# Patient Record
Sex: Female | Born: 1975 | Race: Black or African American | Hispanic: No | Marital: Single | Smoking: Never smoker
Health system: Southern US, Community
[De-identification: ages and names within clinical notes are randomized; demographics above are authoritative.]

## PROBLEM LIST (undated history)

## (undated) DIAGNOSIS — K219 Gastro-esophageal reflux disease without esophagitis: Secondary | ICD-10-CM

## (undated) DIAGNOSIS — Q02 Microcephaly: Secondary | ICD-10-CM

## (undated) DIAGNOSIS — F79 Unspecified intellectual disabilities: Secondary | ICD-10-CM

## (undated) DIAGNOSIS — E119 Type 2 diabetes mellitus without complications: Secondary | ICD-10-CM

## (undated) DIAGNOSIS — Q7160 Lobster-claw hand, unspecified hand: Secondary | ICD-10-CM

## (undated) DIAGNOSIS — D821 Di George's syndrome: Secondary | ICD-10-CM

## (undated) DIAGNOSIS — R1900 Intra-abdominal and pelvic swelling, mass and lump, unspecified site: Secondary | ICD-10-CM

## (undated) DIAGNOSIS — I10 Essential (primary) hypertension: Secondary | ICD-10-CM

## (undated) DIAGNOSIS — Q999 Chromosomal abnormality, unspecified: Secondary | ICD-10-CM

## (undated) DIAGNOSIS — E7800 Pure hypercholesterolemia, unspecified: Secondary | ICD-10-CM

## (undated) DIAGNOSIS — K439 Ventral hernia without obstruction or gangrene: Secondary | ICD-10-CM

## (undated) DIAGNOSIS — Q730 Congenital absence of unspecified limb(s): Secondary | ICD-10-CM

## (undated) HISTORY — PX: CHOLECYSTECTOMY: SHX55

## (undated) HISTORY — PX: HERNIA REPAIR: SHX51

## (undated) HISTORY — PX: HAND SURGERY: SHX662

---

## 2000-09-17 ENCOUNTER — Emergency Department (HOSPITAL_COMMUNITY): Admission: EM | Admit: 2000-09-17 | Discharge: 2000-09-17 | Payer: Self-pay | Admitting: Emergency Medicine

## 2001-11-14 ENCOUNTER — Emergency Department (HOSPITAL_COMMUNITY): Admission: EM | Admit: 2001-11-14 | Discharge: 2001-11-14 | Payer: Self-pay | Admitting: Emergency Medicine

## 2002-04-10 ENCOUNTER — Emergency Department (HOSPITAL_COMMUNITY): Admission: EM | Admit: 2002-04-10 | Discharge: 2002-04-10 | Payer: Self-pay | Admitting: Emergency Medicine

## 2004-06-15 ENCOUNTER — Emergency Department (HOSPITAL_COMMUNITY): Admission: EM | Admit: 2004-06-15 | Discharge: 2004-06-15 | Payer: Self-pay | Admitting: Emergency Medicine

## 2005-07-25 ENCOUNTER — Emergency Department (HOSPITAL_COMMUNITY): Admission: EM | Admit: 2005-07-25 | Discharge: 2005-07-25 | Payer: Self-pay | Admitting: Emergency Medicine

## 2007-01-16 ENCOUNTER — Inpatient Hospital Stay (HOSPITAL_COMMUNITY): Admission: EM | Admit: 2007-01-16 | Discharge: 2007-01-19 | Payer: Self-pay | Admitting: Emergency Medicine

## 2007-03-17 ENCOUNTER — Emergency Department (HOSPITAL_COMMUNITY): Admission: EM | Admit: 2007-03-17 | Discharge: 2007-03-18 | Payer: Self-pay | Admitting: Emergency Medicine

## 2007-03-19 ENCOUNTER — Emergency Department (HOSPITAL_COMMUNITY): Admission: EM | Admit: 2007-03-19 | Discharge: 2007-03-19 | Payer: Self-pay | Admitting: Family Medicine

## 2007-04-01 ENCOUNTER — Emergency Department (HOSPITAL_COMMUNITY): Admission: EM | Admit: 2007-04-01 | Discharge: 2007-04-02 | Payer: Self-pay | Admitting: Emergency Medicine

## 2007-04-08 ENCOUNTER — Emergency Department (HOSPITAL_COMMUNITY): Admission: EM | Admit: 2007-04-08 | Discharge: 2007-04-08 | Payer: Self-pay | Admitting: Emergency Medicine

## 2007-12-30 ENCOUNTER — Encounter: Admission: RE | Admit: 2007-12-30 | Discharge: 2007-12-30 | Payer: Self-pay | Admitting: General Surgery

## 2008-02-06 ENCOUNTER — Emergency Department (HOSPITAL_COMMUNITY): Admission: EM | Admit: 2008-02-06 | Discharge: 2008-02-06 | Payer: Self-pay | Admitting: Emergency Medicine

## 2008-07-13 IMAGING — CR DG CHEST 1V PORT
1 series · 1 of 1 positions shown · non-contrast
Comparison: 01/15/07 chest radiograph, 03/17/07 chest CT.

CLINICAL DATA: Chest pain. 
 PORTABLE CHEST - 1 VIEW:

[AP]
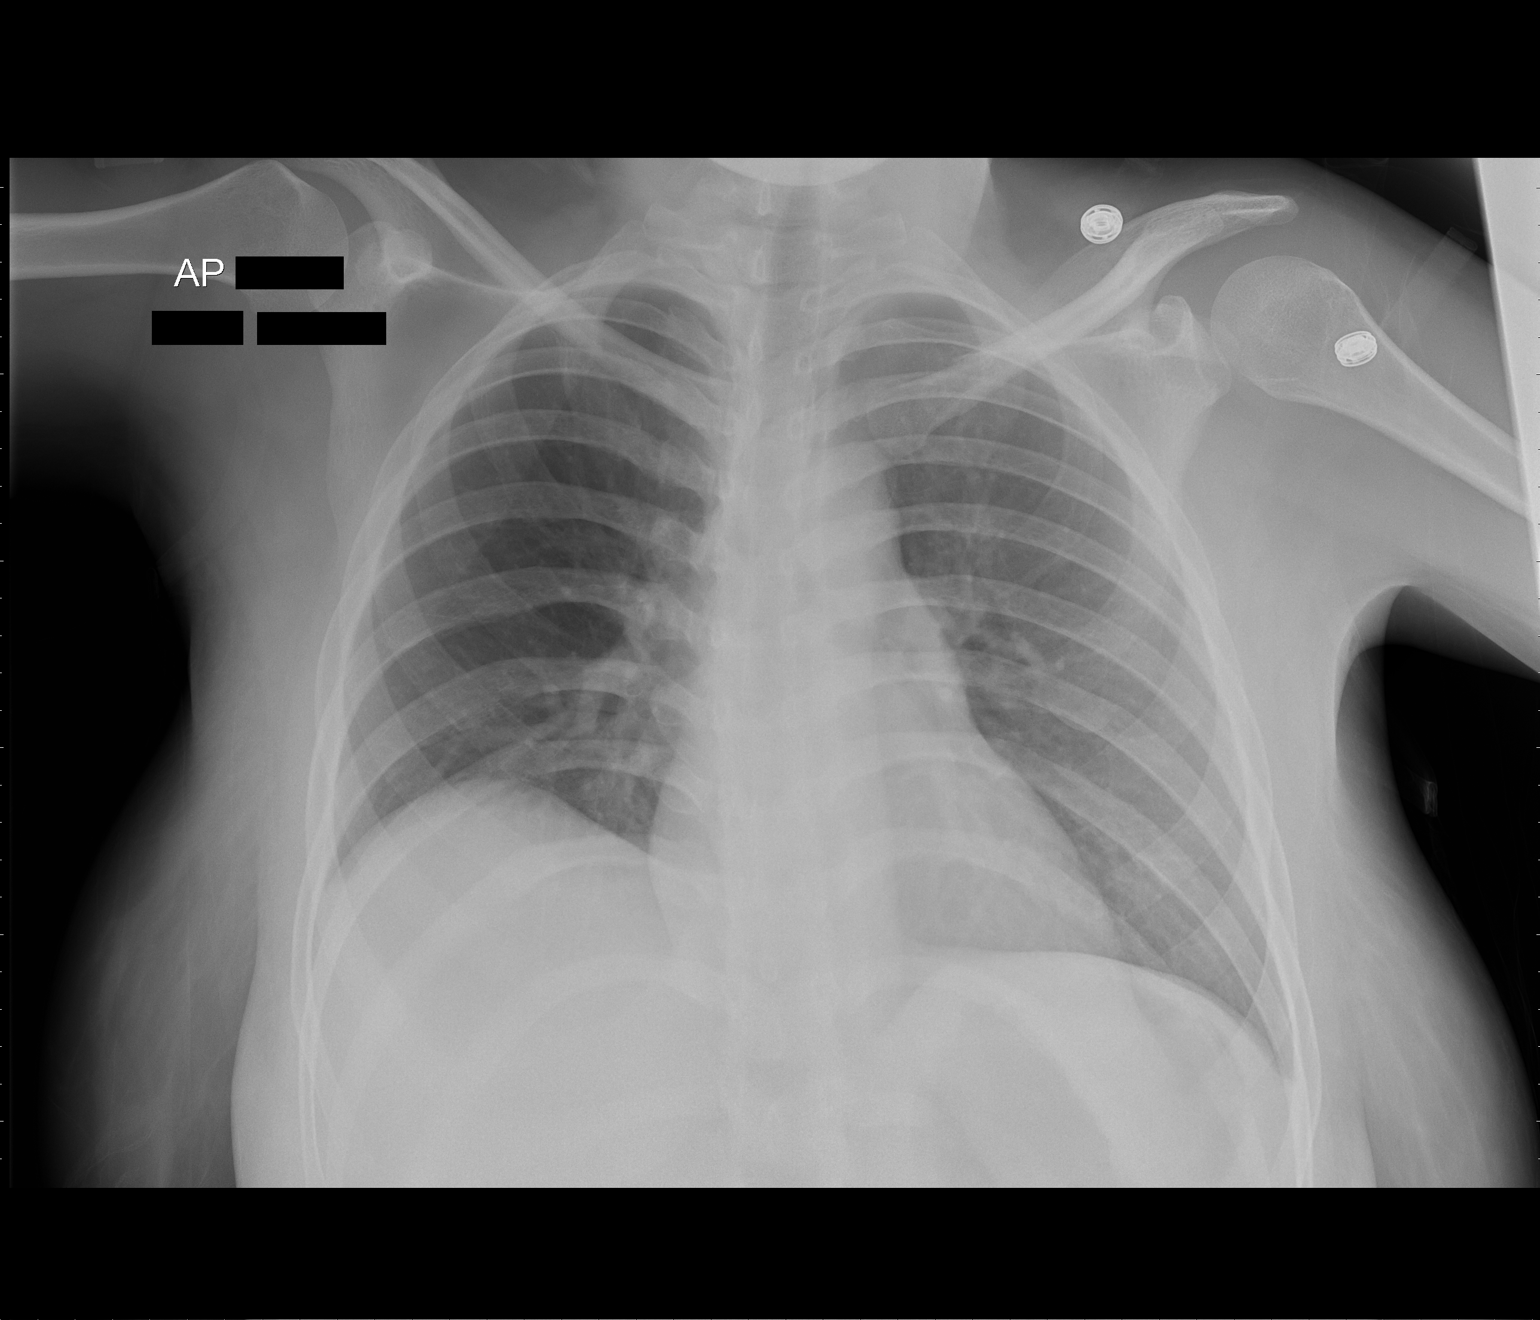

[1 of 1 positions shown; findings below may reference images not displayed]

FINDINGS: Lung volumes are low, but clear.  Heart size is normal.  No pleural effusion.
IMPRESSION: Low volumes, no acute finding.

## 2009-12-06 ENCOUNTER — Emergency Department (HOSPITAL_COMMUNITY): Admission: EM | Admit: 2009-12-06 | Discharge: 2009-12-06 | Payer: Self-pay | Admitting: Family Medicine

## 2010-08-20 NOTE — H&P (Signed)
NAMEEVANNE, MATSUNAGA NO.:  [id number]   MEDICAL RECORD NO.:  [id number]          PATIENT TYPE:  INP   LOCATION:  1428                         FACILITY:  [facility]   PHYSICIAN:  Herbie Saxon, MDDATE OF BIRTH:  May 27, 1975   DATE OF ADMISSION:  01/15/2007  DATE OF DISCHARGE:                              HISTORY & PHYSICAL   PRIMARY CARE PHYSICIAN:  [facility].   PRESENTING COMPLAINT:  Chest pain, three days duration, and bouts of  nausea and vomiting with this chest pain.   HISTORY OF PRESENTING COMPLAINT:  This is a 35 year old, African-  American lady, with a past history of dactyly defect, but no other acute  medical history, who noted a substernal, burning, chest pain after  eating potatoes three days ago.  The pain occasionally gets relieved by  lying flat.  No aggravating factors.  She denies any shortness of breath  or diaphoresis, no dizziness, no loss of consciousness, no syncopal  episode.  She had been noted to have a sinus tachycardia in the  emergency room, and the CT chest is indicative of esophagitis.  Initial  cardiac enzymes are negative.  The CT-angiogram did not show any  pulmonary embolus.  There is no history suggestive of a genitourinary,  musculoskeletal, psychiatric, or integumentary system symptoms.   PAST MEDICAL HISTORY:  Nil of note.   PAST SURGICAL HISTORY:  Nil of note.   FAMILY HISTORY:  Both parents have diabetes.   SOCIAL HISTORY:  No history of alcohol, drugs, or tobacco abuse.   MEDICATIONS:  None.   No known drug allergies.   PHYSICAL EXAMINATION:  GENERAL:  She is a young lady with microcephaly,  atrophic veins, with four digits on the right hand and three digits on  the left hand; not in respiratory distress.  HEENT:  There is marked ptosis, pupils equal and reactive to light and  accommodation.  Oropharynx and nasopharynx are clear.  NECK:  Supple, there is no evidence of JVD, no carotid bruit, no  thyromegaly.  CHEST:  Clear.  ABDOMEN:  Mild right upper quadrant epigastric tenderness, no  organomegaly, abdomen is scaphoid, soft, nontender but mildly distended  in the right upper quadrant and epigastric area, bowel sounds  normoactive.  NEUROLOGIC:  She is alert and oriented x3, no focal deficits present.  EXTREMITIES:  No pedal edema.  Power is 4+ in all limbs.   AVAILABLE LABORATORY DATA:  Sodium is 133, potassium 3.9, chloride 97,  bicarbonate 20, glucose 309, BUN 12, creatinine 0.68.  Troponin 0.05.  Urinalysis shows ketones greater than 80.  D-dimer 1.16.  CT chest  esophageal dilation,-ve for PE.   ASSESSMENT:  1. Chest pain, rule out esophagitis;  coronary syndrome is less      likely.  2. Mild hyponatremia.  3. New onset diabetes mellitus.   PLAN:  Will admit patient to a telemetry bed, we will keep her NPO, we  will get a Gastroenterology evaluation, Protonix 40 mg IV q.12h, IV  fluids D-5 half-normal saline at 50 ml an hour, check liver function  tests, hemoccult's, EKG's and cardiac enzymes q.8h, check  thyroid  function tests, check hemoglobin A1c and fasting lipid level.  Will put  her on a sliding scale insulin coverage with Lantus 50 units subcu  q.h.s., Phenergan 12.5 mg IV q.8h p.r.n. and Reglan 30 mg IV q.6h p.r.n.  We will hold the aspirin and the Lovenox for now, SCD boots for DVT  prophylaxis.  Morphine 2 mg IV q.6h p.r.n., nitroglycerin 0.4 mg  sublingual p.r.n., Rocephin 1 gm IV daily, Carafate 1 gm p.o. q.6h,  Metoprolol 25 mg p.o. b.i.d., amlodipine 5 mg daily, Cardizem 10 mg IV  q.6h p.r.n. blood pressure greater than 160/110 or heart rate> 110.  We  will get a BMP in the morning, H&H q.8h x24 hours, and pain control with  Dilaudid 0.5 to 1 mg IV q.4h p.r.n.  DuoNeb's 3 ml q.6h p.r.n.      Herbie Saxon, MD  Electronically Signed     MIO/MEDQ  D:  01/16/2007  T:  01/16/2007  Job:  [id number]

## 2010-08-23 NOTE — Discharge Summary (Signed)
NAMEHADASSAH, RANA NO.:  [id number]   MEDICAL RECORD NO.:  [id number]          PATIENT TYPE:  INP   LOCATION:  1428                         [facility]ACILITY:  [facility]   PHYSICIAN:  Elliot Cousin, M.D.    DATE OF BIRTH:  [birth year]   DATE OF ADMISSION:  01/15/2007  DATE OF DISCHARGE:  01/19/2007                               DISCHARGE SUMMARY   DISCHARGE DIAGNOSES:  1. Chest pain likely secondary to esophagitis.  2. Newly diagnosed type 2 diabetes mellitus with a glucose of 309 on      admission and a hemoglobin A1c of 11.7.  3. Hyperlipidemia.  4. Mild hypotension secondary to Lopressor.   DISCHARGE MEDICATIONS:  1. Glipizide 5 mg half a tablet b.i.d.  2. Metformin 500 mg half a tablet b.i.d.  3. Lipitor 10 mg half a tablet daily.  4. Protonix 40 mg 1 tablet daily.   DISCHARGE DISPOSITION:  The patient was discharged to home in improved  and stable condition on January 19, 2007.   FOLLOW UP:  She was advised to follow up with her primary care physician  at the [facility] in 5-7 days.   CONSULTATIONS:  None.   PROCEDURE PERFORMED:  CT scan of the chest performed on January 16, 2007.  The results revealed no embolus identified.  The thoracic aorta  appears unremarkable.  Borderline wall thickening of the distal  esophagus.  Esophagitis cannot be excluded.   HISTORY OF PRESENT ILLNESS:  The patient is a 35 year old woman with a  past medical history significant for congenital anomalies, who presented  to the emergency department with a chief complaint of burning substernal  chest pain after eating potatoes 3 days ago.  When she was evaluated in  the emergency department, a CT scan of the chest was ordered.  The CT  scan of the chest revealed no pulmonary embolism.  However, there was a  suggestion of esophagitis.  In addition, the patient's venous glucose  was found to be 309.  She had no known prior history of diabetes  mellitus.  The patient was  admitted for further evaluation and  management.  For additional details, please see the dictated history and  physical.   HOSPITAL COURSE:  1. CHEST PAIN LIKELY SECONDARY TO ESOPHAGITIS.  The patient was      started on Protonix empirically.  At the time of the hospital      admission, the patient's EKG revealed sinus tachycardia with a      heart rate of 114 beats per minute and no other acute      abnormalities. Gentle IV fluids were started.  For an additional      evaluation, cardiac enzymes were ordered.  Her cardiac enzymes were      negative x3 sets.  Her TSH was assessed and found to be within      normal limits at 1.5.  Her liver transaminases were well within      normal limits.  She was started empirically on aspirin, as needed      sublingual nitroglycerin, and a small dose of Lopressor.  However      during the hospital course, the Lopressor caused her blood pressure      to fall into the 80s systolically.  Therefore, the Lopressor was      discontinued.  The aspirin was discontinued as well.  At the time      of hospital discharge, the patient had no complaints of chest pain.      Of note, the patient was treated with 1 day of Carafate.  However,      the Carafate was discontinued and the patient was treated      exclusively with Protonix at the time of hospital discharge.   1. NEWLY DIAGNOSED TYPE 2 DIABETES MELLITUS.  As indicated above, the      patient's venous glucose was 309 at the time of the hospital      admission.  The patient stated that her mother has type 2 diabetes      mellitus.  She was somewhat familiar with the condition, however,      she needed additional education.  The registered dietician and the      nursing staff provided her with some instructions, however, it      appeared that the patient's understanding was somewhat limited.      Therefore, some of the education and counseling were provided for      the patient's mother and siblings as well.   The patient was started      on treatment with sliding-scale NovoLog and Lantus.  Eventually,      glipizide and metformin were added.  Over the course of the      hospitalization, the patient's venous glucose improved      significantly.  It was felt that the patient did not need home      insulin.  Rather, control could be achieved and probably will be      achieved with oral hypoglycemic agents only.  Therefore, the      patient was discharged home on glipizide and metformin.  Her      hemoglobin A1c was found to the 11.7 during the hospitalization.      The patient was advised to follow up with her physician at the      [facility] in 5-7 days.  The patient was also advised to      not take the glipizide and metformin if her capillary blood glucose      was less than 100.  The patient voiced understanding.   1. HYPERLIPIDEMIA.  The patient's fasting lipid panel was assessed      during the hospitalization.  The results are as follows, total      cholesterol 180, triglycerides 96, LDL cholesterol 132 and HDL      cholesterol 29.  The patient was started on a low-dose of Lipitor      at 5 mg q.h.s.      Elliot Cousin, M.D.  Electronically Signed     DF/MEDQ  D:  01/21/2007  T:  01/22/2007  Job:  [id number]   cc:   Melvern Banker  Fax: [phone]

## 2010-12-26 LAB — DIFFERENTIAL
Basophils Absolute: 0
Lymphs Abs: 0.9
Monocytes Absolute: 0.1
Monocytes Relative: 1 — ABNORMAL LOW
Neutro Abs: 7.6
Neutrophils Relative %: 88 — ABNORMAL HIGH

## 2010-12-26 LAB — CBC
HCT: 36
Platelets: 460 — ABNORMAL HIGH
RBC: 4.5
WBC: 8.6

## 2010-12-26 LAB — LIPASE, BLOOD: Lipase: 23

## 2010-12-26 LAB — BASIC METABOLIC PANEL
Chloride: 96
GFR calc Af Amer: >60
GFR calc non Af Amer: >60

## 2010-12-26 LAB — POCT CARDIAC MARKERS
CKMB, poc: 2.2
Troponin i, poc: <0.05

## 2010-12-26 LAB — HEPATIC FUNCTION PANEL
ALT: 20
AST: 28
Albumin: 3.4 — ABNORMAL LOW
Indirect Bilirubin: 1 — ABNORMAL HIGH

## 2011-01-10 LAB — I-STAT 8, (EC8 V) (CONVERTED LAB)
Acid-base deficit: 7 — ABNORMAL HIGH
BUN: 13
Glucose, Bld: 238 — ABNORMAL HIGH
HCT: 43
Hemoglobin: 14.6
Potassium: 4.1
TCO2: 19
pH, Ven: 7.317 — ABNORMAL HIGH

## 2011-01-10 LAB — DIFFERENTIAL
Basophils Absolute: 0
Lymphs Abs: 0.9
Monocytes Absolute: 0.1
Monocytes Relative: 2 — ABNORMAL LOW
Neutro Abs: 8 — ABNORMAL HIGH
Neutrophils Relative %: 88 — ABNORMAL HIGH

## 2011-01-10 LAB — CBC
HCT: 37.9
RDW: 13.4

## 2011-01-10 LAB — POCT CARDIAC MARKERS: Operator id: 282201

## 2011-01-13 LAB — COMPREHENSIVE METABOLIC PANEL
BUN: 12
CO2: 25
Calcium: 9.6
GFR calc Af Amer: >60
GFR calc non Af Amer: >60
Glucose, Bld: 243 — ABNORMAL HIGH
Potassium: 3.8
Total Protein: 8.4 — ABNORMAL HIGH

## 2011-01-13 LAB — DIFFERENTIAL
Eosinophils Absolute: 0 — ABNORMAL LOW
Eosinophils Relative: 0
Lymphs Abs: 0.7
Neutro Abs: 9.2 — ABNORMAL HIGH
Neutrophils Relative %: 90 — ABNORMAL HIGH

## 2011-01-13 LAB — D-DIMER, QUANTITATIVE: D-Dimer, Quant: 0.98 — ABNORMAL HIGH

## 2011-01-13 LAB — CBC
MCV: 80.9
Platelets: 401 — ABNORMAL HIGH
RDW: 13
WBC: 10.2

## 2011-01-13 LAB — POCT CARDIAC MARKERS: Troponin i, poc: <0.05

## 2011-01-16 LAB — DIFFERENTIAL
Basophils Absolute: 0.1
Basophils Relative: 2 — ABNORMAL HIGH
Eosinophils Absolute: 0
Monocytes Relative: 6
Neutro Abs: 4.8
Neutrophils Relative %: 64

## 2011-01-16 LAB — CK TOTAL AND CKMB (NOT AT ARMC)
CK, MB: 1.3
Relative Index: INVALID
Total CK: 40

## 2011-01-16 LAB — BASIC METABOLIC PANEL
BUN: 12
BUN: 6
CO2: 21
CO2: 24
CO2: 28
Calcium: 7.9 — ABNORMAL LOW
Calcium: 8.7
Chloride: 104
Chloride: 107
Chloride: 97
GFR calc Af Amer: >60
GFR calc Af Amer: >60
GFR calc non Af Amer: >60
Glucose, Bld: 164 — ABNORMAL HIGH
Glucose, Bld: 174 — ABNORMAL HIGH
Glucose, Bld: 309 — ABNORMAL HIGH
Potassium: 3.4 — ABNORMAL LOW
Potassium: 3.6
Potassium: 3.9
Potassium: 4
Sodium: 133 — ABNORMAL LOW
Sodium: 136
Sodium: 136
Sodium: 138

## 2011-01-16 LAB — HEPATIC FUNCTION PANEL
AST: 17
Albumin: 3.3 — ABNORMAL LOW
Total Bilirubin: 0.9

## 2011-01-16 LAB — CARDIAC PANEL(CRET KIN+CKTOT+MB+TROPI)
CK, MB: 1.3
Relative Index: INVALID
Troponin I: 0.01

## 2011-01-16 LAB — URINALYSIS, ROUTINE W REFLEX MICROSCOPIC
Bilirubin Urine: NEGATIVE
Glucose, UA: >1000 — AB
Ketones, ur: >80 — AB
Protein, ur: 100 — AB
Urobilinogen, UA: 0.2

## 2011-01-16 LAB — TROPONIN I: Troponin I: 0.02

## 2011-01-16 LAB — APTT: aPTT: 29

## 2011-01-16 LAB — LIPID PANEL
HDL: 29 — ABNORMAL LOW
Triglycerides: 96
VLDL: 19

## 2011-01-16 LAB — POCT CARDIAC MARKERS
CKMB, poc: <1 — ABNORMAL LOW
CKMB, poc: <1 — ABNORMAL LOW
Myoglobin, poc: 31.6
Myoglobin, poc: 46.8
Operator id: 4531
Troponin i, poc: <0.05
Troponin i, poc: <0.05

## 2011-01-16 LAB — HEMOGLOBIN A1C
Hgb A1c MFr Bld: 11.7 — ABNORMAL HIGH
Mean Plasma Glucose: 339

## 2011-01-16 LAB — D-DIMER, QUANTITATIVE: D-Dimer, Quant: 1.16 — ABNORMAL HIGH

## 2011-01-16 LAB — CBC
Platelets: 409 — ABNORMAL HIGH
RBC: 4.4
WBC: 7.5

## 2011-01-16 LAB — PROTIME-INR: Prothrombin Time: 13.2

## 2011-01-16 LAB — URINE MICROSCOPIC-ADD ON

## 2014-11-17 ENCOUNTER — Encounter (HOSPITAL_COMMUNITY): Payer: Self-pay | Admitting: *Deleted

## 2014-11-17 ENCOUNTER — Emergency Department (INDEPENDENT_AMBULATORY_CARE_PROVIDER_SITE_OTHER)
Admission: EM | Admit: 2014-11-17 | Discharge: 2014-11-17 | Disposition: A | Discharge status: Home / Self Care | Payer: Self-pay | Source: Home / Self Care | Attending: Family Medicine | Admitting: Family Medicine

## 2014-11-17 DIAGNOSIS — Z5181 Encounter for therapeutic drug level monitoring: Secondary | ICD-10-CM

## 2014-11-17 DIAGNOSIS — Z79899 Other long term (current) drug therapy: Secondary | ICD-10-CM

## 2014-11-17 HISTORY — DX: Pure hypercholesterolemia, unspecified: E78.00

## 2014-11-17 HISTORY — DX: Type 2 diabetes mellitus without complications: E11.9

## 2014-11-17 NOTE — Discharge Instructions (Signed)
See your doctor as needed

## 2014-11-17 NOTE — ED Notes (Addendum)
Pt  Is  Requesting  Refills  On  Medication  yest  She has  Pills  In the  bootle  She  States  She  Has  No pcp and  She  Also  Needs  Test  Strips   Unsure  As  To    Her  Compliance  Her aunt is  With  Her      At  This  time

## 2014-11-17 NOTE — ED Provider Notes (Signed)
CSN: [id number]     Arrival date & time 11/17/14  14 History   First MD Initiated Contact with Patient 11/17/14 1747     Chief Complaint  Patient presents with  . Medication Refill   (Consider location/radiation/quality/duration/timing/severity/associated sxs/prior Treatment) Patient is a 39 y.o. female presenting with diabetes problem. The history is provided by the patient and a relative.  Diabetes This is a new problem. Episode onset: pt here requesting accucheck aviva plus test strips for home glucose monitor.    Past Medical History  Diagnosis Date  . Diabetes mellitus without complication   . Hypercholesterolemia    No past surgical history on file. History reviewed. No pertinent family history. Social History  Substance Use Topics  . Smoking status: Never Smoker   . Smokeless tobacco: None  . Alcohol Use: No   OB History    No data available     Review of Systems  Constitutional: Negative.   Neurological: Negative.     Allergies  Review of patient's allergies indicates no known allergies.  Home Medications   Prior to Admission medications   Medication Sig Start Date End Date Taking? Authorizing Provider  glyBURIDE (DIABETA) 5 MG tablet Take 5 mg by mouth daily with breakfast.   Yes Historical Provider, MD  metFORMIN (GLUCOPHAGE) 850 MG tablet Take 850 mg by mouth 2 (two) times daily with a meal.   Yes Historical Provider, MD  pioglitazone (ACTOS) 15 MG tablet Take 15 mg by mouth 2 (two) times daily.   Yes Historical Provider, MD  simvastatin (ZOCOR) 20 MG tablet Take 20 mg by mouth daily.   Yes Historical Provider, MD   BP 146/88 mmHg  Pulse 105  Temp(Src) 98.8 F (37.1 C) (Oral)  Resp 12  SpO2 100%  LMP 11/06/2014 Physical Exam  Constitutional: She appears well-developed and well-nourished.  Musculoskeletal: She exhibits no tenderness.  Neurological: She is alert.  Skin: Skin is warm and dry.  Nursing note and vitals reviewed.   ED Course   Procedures (including critical care time) Labs Review Labs Reviewed - No data to display  Imaging Review No results found.   MDM   1. Medication changed to therapeutic equivalent        Billy Fischer, MD 11/17/14 [phone]

## 2014-12-27 ENCOUNTER — Emergency Department (INDEPENDENT_AMBULATORY_CARE_PROVIDER_SITE_OTHER)
Admission: EM | Admit: 2014-12-27 | Discharge: 2014-12-27 | Disposition: A | Discharge status: Home / Self Care | Payer: Self-pay | Source: Home / Self Care | Attending: Family Medicine | Admitting: Family Medicine

## 2014-12-27 ENCOUNTER — Encounter (HOSPITAL_COMMUNITY): Payer: Self-pay | Admitting: Emergency Medicine

## 2014-12-27 DIAGNOSIS — E785 Hyperlipidemia, unspecified: Secondary | ICD-10-CM

## 2014-12-27 DIAGNOSIS — E119 Type 2 diabetes mellitus without complications: Secondary | ICD-10-CM

## 2014-12-27 LAB — POCT I-STAT, CHEM 8
BUN: 14 mg/dL (ref 6–20)
Calcium, Ion: 1.22 mmol/L (ref 1.12–1.23)
Chloride: 101 mmol/L (ref 101–111)
Creatinine, Ser: 0.6 mg/dL (ref 0.44–1.00)
Glucose, Bld: 321 mg/dL — ABNORMAL HIGH (ref 65–99)
HCT: 38 % (ref 36.0–46.0)
Hemoglobin: 12.9 g/dL (ref 12.0–15.0)
Potassium: 3.8 mmol/L (ref 3.5–5.1)
Sodium: 137 mmol/L (ref 135–145)
TCO2: 22 mmol/L (ref 0–100)

## 2014-12-27 MED ORDER — GLIPIZIDE 5 MG PO TABS: 5.0000 mg | ORAL_TABLET | Freq: Two times a day (BID) | ORAL | Status: DC

## 2014-12-27 MED ORDER — METFORMIN HCL 850 MG PO TABS: 850.0000 mg | ORAL_TABLET | Freq: Two times a day (BID) | ORAL | Status: DC

## 2014-12-27 MED ORDER — SIMVASTATIN 20 MG PO TABS: 20.0000 mg | ORAL_TABLET | Freq: Every day | ORAL | Status: DC

## 2014-12-27 NOTE — ED Provider Notes (Signed)
CSN: [id number]     Arrival date & time 12/27/14  1449 History   First MD Initiated Contact with Patient 12/27/14 1719     Chief Complaint  Patient presents with  . Medication Refill  . Hyperglycemia   (Consider location/radiation/quality/duration/timing/severity/associated sxs/prior Treatment) Patient is a 39 y.o. female presenting with hyperglycemia. The history is provided by the patient.  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 Her primary care doctor listed on the bottles is Dr. Hulda Marin, but he has decided on his office door saying that he is closed until further notice. Patient denies any acute symptoms such as blurry vision,  polyuria, abdominal pain, shortness of breath, weakness. She also denies numbness in her hands or feet.  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 She was brought here by her aunt.  Past Medical History  Diagnosis Date  . Diabetes mellitus without complication   . Hypercholesterolemia    History reviewed. No pertinent past surgical history. History reviewed. No pertinent family history. Social History  Substance Use Topics  . Smoking status: Never Smoker   . Smokeless tobacco: None  . Alcohol Use: No   OB History    No data available     Review of Systems  Constitutional: Negative.   HENT: Negative.   Eyes: Positive for visual disturbance.  Respiratory: Negative.   Cardiovascular: Negative.     Allergies  Review of patient's allergies indicates no known allergies.  Home Medications   Prior to Admission medications   Medication Sig Start Date End Date Taking? Authorizing Provider  glyBURIDE (DIABETA) 5 MG tablet Take 5 mg by mouth daily with breakfast.   Yes Historical Provider, MD  metFORMIN (GLUCOPHAGE) 850 MG tablet Take 850 mg by mouth 2 (two) times daily with a meal.   Yes  Historical Provider, MD  pioglitazone (ACTOS) 15 MG tablet Take 15 mg by mouth 2 (two) times daily.   Yes Historical Provider, MD  simvastatin (ZOCOR) 20 MG tablet Take 20 mg by mouth daily.   Yes Historical Provider, MD   Meds Ordered and Administered this Visit  Medications - No data to display  BP 140/94 mmHg  Pulse 108  Temp(Src) 98.3 F (36.8 C) (Oral)  Resp 18  SpO2 100%  LMP 11/06/2014 (Exact Date) No data found.   Physical Exam  Constitutional: She appears well-developed and well-nourished.  HENT:  Head: Normocephalic and atraumatic.  Right Ear: External ear normal.  Left Ear: External ear normal.  Mouth/Throat: Oropharynx is clear and moist.  Eyes: Conjunctivae are normal.  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 Neck supple. No thyromegaly present.  Cardiovascular: Normal rate, regular rhythm and normal heart sounds.   Pulmonary/Chest: Effort normal and breath sounds normal.  Abdominal: Soft.  Lymphadenopathy:    She has no cervical adenopathy.  Nursing note and vitals reviewed.   ED Course  Procedures (including critical care time)  Labs Review Results for orders placed or performed during the hospital encounter of 12/27/14  I-STAT, chem 8  Result Value Ref Range   Sodium 137 135 - 145 mmol/L   Potassium 3.8 3.5 - 5.1 mmol/L   Chloride 101 101 - 111 mmol/L   BUN 14 6 - 20 mg/dL   Creatinine, Ser 0.60 0.44 - 1.00 mg/dL   Glucose, Bld 321 (H)  65 - 99 mg/dL   Calcium, Ion 1.22 1.12 - 1.23 mmol/L   TCO2 22 0 - 100 mmol/L   Hemoglobin 12.9 12.0 - 15.0 g/dL   HCT 38.0 36.0 - 46.0 %       MDM   Assessment: Patient has uncontrolled diabetes but does not have any acute symptoms or signs of decompensation. She's been out of her medicine for a while so it makes sense to get her back on her medicine and have her follow-up with another primary care provider.     ICD-9-CM ICD-10-CM   1. Type 2 diabetes mellitus without  complication 771.16 F79.0 glipiZIDE (GLUCOTROL) 5 MG tablet     metFORMIN (GLUCOPHAGE) 850 MG tablet  2. Hyperlipidemia 272.4 E78.5 simvastatin (ZOCOR) 20 MG tablet     Signed, Robyn Haber, MD    Robyn Haber, MD 12/27/14 1755

## 2014-12-27 NOTE — ED Notes (Signed)
The patient presented to the [facility] with a request for a medication refill. The patient stated that her PCP was closed and she ran out of her simvastatin, metformin, glyburide, and actos.

## 2014-12-27 NOTE — Discharge Instructions (Signed)
Your diabetes is not being well controlled. He need to find another doctor who can help you control the diabetes better. I have refilled the prescriptions that should help your blood sugar down. There are 2 changes: We changed the code be a ride to glipizide because it is a more safe and effective control agent for diabetes. We also discontinued the Pioglitizone  because it can cause heart problems.

## 2015-02-23 ENCOUNTER — Emergency Department (INDEPENDENT_AMBULATORY_CARE_PROVIDER_SITE_OTHER)
Admission: EM | Admit: 2015-02-23 | Discharge: 2015-02-23 | Disposition: A | Discharge status: Home / Self Care | Payer: Medicaid Other | Source: Home / Self Care | Attending: Family Medicine | Admitting: Family Medicine

## 2015-02-23 ENCOUNTER — Encounter (HOSPITAL_COMMUNITY): Payer: Self-pay | Admitting: Emergency Medicine

## 2015-02-23 DIAGNOSIS — E119 Type 2 diabetes mellitus without complications: Secondary | ICD-10-CM | POA: Diagnosis not present

## 2015-02-23 LAB — GLUCOSE, CAPILLARY: GLUCOSE-CAPILLARY: 270 mg/dL — AB (ref 65–99)

## 2015-02-23 MED ORDER — SITAGLIPTIN PHOSPHATE 25 MG PO TABS: 25.0000 mg | ORAL_TABLET | Freq: Every day | ORAL | Status: DC

## 2015-02-23 MED ORDER — GLUCOSE BLOOD VI STRP: ORAL_STRIP | Status: DC

## 2015-02-23 NOTE — Discharge Instructions (Signed)
Blood Glucose Monitoring, Adult °Monitoring your blood glucose (also know as blood sugar) helps you to manage your diabetes. It also helps you and your health care provider monitor your diabetes and determine how well your treatment plan is working. °WHY SHOULD YOU MONITOR YOUR BLOOD GLUCOSE? °· It can help you understand how food, exercise, and medicine affect your blood glucose. °· It allows you to know what your blood glucose is at any given moment. You can quickly tell if you are having low blood glucose (hypoglycemia) or high blood glucose (hyperglycemia). °· It can help you and your health care provider know how to adjust your medicines. °· It can help you understand how to manage an illness or adjust medicine for exercise. °WHEN SHOULD YOU TEST? °Your health care provider will help you decide how often you should check your blood glucose. This may depend on the type of diabetes you have, your diabetes control, or the types of medicines you are taking. Be sure to write down all of your blood glucose readings so that this information can be reviewed with your health care provider. See below for examples of testing times that your health care provider may suggest. °Type 1 Diabetes °· Test at least 2 times per day if your diabetes is well controlled, if you are using an insulin pump, or if you perform multiple daily injections. °· If your diabetes is not well controlled or if you are sick, you may need to test more often. °· It is a good idea to also test: °¨ Before every insulin injection. °¨ Before and after exercise. °¨ Between meals and 2 hours after a meal. °¨ Occasionally between 2:00 a.m. and 3:00 a.m. °Type 2 Diabetes °· If you are taking insulin, test at least 2 times per day. However, it is best to test before every insulin injection. °· If you take medicines by mouth (orally), test 2 times a day. °· If you are on a controlled diet, test once a day. °· If your diabetes is not well controlled or if you  are sick, you may need to monitor more often. °HOW TO MONITOR YOUR BLOOD GLUCOSE °Supplies Needed °· Blood glucose meter. °· Test strips for your meter. Each meter has its own strips. You must use the strips that go with your own meter. °· A pricking needle (lancet). °· A device that holds the lancet (lancing device). °· A journal or log book to write down your results. °Procedure °· Wash your hands with soap and water. Alcohol is not preferred. °· Prick the side of your finger (not the tip) with the lancet. °· Gently milk the finger until a small drop of blood appears. °· Follow the instructions that come with your meter for inserting the test strip, applying blood to the strip, and using your blood glucose meter. °Other Areas to Get Blood for Testing °Some meters allow you to use other areas of your body (other than your finger) to test your blood. These areas are called alternative sites. The most common alternative sites are: °· The forearm. °· The thigh. °· The back area of the lower leg. °· The palm of the hand. °The blood flow in these areas is slower. Therefore, the blood glucose values you get may be delayed, and the numbers are different from what you would get from your fingers. Do not use alternative sites if you think you are having hypoglycemia. Your reading will not be accurate. Always use a finger if you are   having hypoglycemia. Also, if you cannot feel your lows (hypoglycemia unawareness), always use your fingers for your blood glucose checks. °ADDITIONAL TIPS FOR GLUCOSE MONITORING °· Do not reuse lancets. °· Always carry your supplies with you. °· All blood glucose meters have a 24-hour "hotline" number to call if you have questions or need help. °· Adjust (calibrate) your blood glucose meter with a control solution after finishing a few boxes of strips. °BLOOD GLUCOSE RECORD KEEPING °It is a good idea to keep a daily record or log of your blood glucose readings. Most glucose meters, if not all,  keep your glucose records stored in the meter. Some meters come with the ability to download your records to your home computer. Keeping a record of your blood glucose readings is especially helpful if you are wanting to look for patterns. Make notes to go along with the blood glucose readings because you might forget what happened at that exact time. Keeping good records helps you and your health care provider to work together to achieve good diabetes management.  °  °This information is not intended to replace advice given to you by your health care provider. Make sure you discuss any questions you have with your health care provider. °  °Document Released: 03/27/2003 Document Revised: 04/14/2014 Document Reviewed: 08/16/2012 °Elsevier Interactive Patient Education ©2016 [employer] ° °Hyperglycemia °Hyperglycemia occurs when the glucose (sugar) in your blood is too high. Hyperglycemia can happen for many reasons, but it most often happens to people who do not know they have diabetes or are not managing their diabetes properly.  °CAUSES  °Whether you have diabetes or not, there are other causes of hyperglycemia. Hyperglycemia can occur when you have diabetes, but it can also occur in other situations that you might not be as aware of, such as: °Diabetes °· If you have diabetes and are having problems controlling your blood glucose, hyperglycemia could occur because of some of the following reasons: °¨ Not following your meal plan. °¨ Not taking your diabetes medications or not taking it properly. °¨ Exercising less or doing less activity than you normally do. °¨ Being sick. °Pre-diabetes °· This cannot be ignored. Before people develop Type 2 diabetes, they almost always have "pre-diabetes." This is when your blood glucose levels are higher than normal, but not yet high enough to be diagnosed as diabetes. Research has shown that some long-term damage to the body, especially the heart and circulatory system, may  already be occurring during pre-diabetes. If you take action to manage your blood glucose when you have pre-diabetes, you may delay or prevent Type 2 diabetes from developing. °Stress °· If you have diabetes, you may be "diet" controlled or on oral medications or insulin to control your diabetes. However, you may find that your blood glucose is higher than usual in the hospital whether you have diabetes or not. This is often referred to as "stress hyperglycemia." Stress can elevate your blood glucose. This happens because of hormones put out by the body during times of stress. If stress has been the cause of your high blood glucose, it can be followed regularly by your caregiver. That way he/she can make sure your hyperglycemia does not continue to get worse or progress to diabetes. °Steroids °· Steroids are medications that act on the infection fighting system (immune system) to block inflammation or infection. One side effect can be a rise in blood glucose. Most people can produce enough extra insulin to allow for this rise, but   for those who cannot, steroids make blood glucose levels go even higher. It is not unusual for steroid treatments to "uncover" diabetes that is developing. It is not always possible to determine if the hyperglycemia will go away after the steroids are stopped. A special blood test called an A1c is sometimes done to determine if your blood glucose was elevated before the steroids were started. °SYMPTOMS °· Thirsty. °· Frequent urination. °· Dry mouth. °· Blurred vision. °· Tired or fatigue. °· Weakness. °· Sleepy. °· Tingling in feet or leg. °DIAGNOSIS  °Diagnosis is made by monitoring blood glucose in one or all of the following ways: °· A1c test. This is a chemical found in your blood. °· Fingerstick blood glucose monitoring. °· Laboratory results. °TREATMENT  °First, knowing the cause of the hyperglycemia is important before the hyperglycemia can be treated. Treatment may include, but is  not be limited to: °· Education. °· Change or adjustment in medications. °· Change or adjustment in meal plan. °· Treatment for an illness, infection, etc. °· More frequent blood glucose monitoring. °· Change in exercise plan. °· Decreasing or stopping steroids. °· Lifestyle changes. °HOME CARE INSTRUCTIONS  °· Test your blood glucose as directed. °· Exercise regularly. Your caregiver will give you instructions about exercise. Pre-diabetes or diabetes which comes on with stress is helped by exercising. °· Eat wholesome, balanced meals. Eat often and at regular, fixed times. Your caregiver or nutritionist will give you a meal plan to guide your sugar intake. °· Being at an ideal weight is important. If needed, losing as little as 10 to 15 pounds may help improve blood glucose levels. °SEEK MEDICAL CARE IF:  °· You have questions about medicine, activity, or diet. °· You continue to have symptoms (problems such as increased thirst, urination, or weight gain). °SEEK IMMEDIATE MEDICAL CARE IF:  °· You are vomiting or have diarrhea. °· Your breath smells fruity. °· You are breathing faster or slower. °· You are very sleepy or incoherent. °· You have numbness, tingling, or pain in your feet or hands. °· You have chest pain. °· Your symptoms get worse even though you have been following your caregiver's orders. °· If you have any other questions or concerns. °  °This information is not intended to replace advice given to you by your health care provider. Make sure you discuss any questions you have with your health care provider. °  °Document Released: 09/17/2000 Document Revised: 06/16/2011 Document Reviewed: 11/28/2014 °Elsevier Interactive Patient Education ©2016 [employer]. ° °

## 2015-02-23 NOTE — ED Provider Notes (Addendum)
CSN: [id number]     Arrival date & time 02/23/15  1824 History   First MD Initiated Contact with Patient 02/23/15 1907     Chief Complaint  Patient presents with  . Hyperglycemia   (Consider location/radiation/quality/duration/timing/severity/associated sxs/prior Treatment) HPI Comments: 39 year old female resents to the urgent care with concerns of her blood sugars being elevated over the past couple of months. She brought her daily blood sugar recordings with her. Her range is between 193 and 266 with an apparent average of around 220. Today her blood sugar is 270. She denies polyuria or polydipsia. Denies urinary symptoms. She actually states that she thought this was her doctor's office. Her meds include glipizide 5 mg daily and metformin 850 mg daily.  Patient is a 39 y.o. female presenting with hyperglycemia.  Hyperglycemia Associated symptoms: no chest pain, no fatigue, no fever, no increased thirst, no polyuria and no shortness of breath     Past Medical History  Diagnosis Date  . Diabetes mellitus without complication ([place])   . Hypercholesterolemia    History reviewed. No pertinent past surgical history. History reviewed. No pertinent family history. Social History  Substance Use Topics  . Smoking status: Never Smoker   . Smokeless tobacco: None  . Alcohol Use: No   OB History    No data available     Review of Systems  Constitutional: Negative for fever, activity change and fatigue.  HENT: Negative.   Respiratory: Negative for cough and shortness of breath.   Cardiovascular: Positive for palpitations. Negative for chest pain and leg swelling.       Patient states that she feels like her heart beats too fast sometimes.  Gastrointestinal: Negative.   Endocrine: Negative for polydipsia, polyphagia and polyuria.  Genitourinary: Negative.   Skin: Negative.   Neurological: Negative.     Allergies  Review of patient's allergies indicates no known allergies.  Home  Medications   Prior to Admission medications   Medication Sig Start Date End Date Taking? Authorizing Provider  glipiZIDE (GLUCOTROL) 5 MG tablet Take 1 tablet (5 mg total) by mouth 2 (two) times daily before a meal. 12/27/14   Robyn Haber, MD  glucose blood test strip Check your sugar daily at different times of the day. 02/23/15   Janne Napoleon, NP  metFORMIN (GLUCOPHAGE) 850 MG tablet Take 1 tablet (850 mg total) by mouth 2 (two) times daily with a meal. 12/27/14   Robyn Haber, MD  simvastatin (ZOCOR) 20 MG tablet Take 1 tablet (20 mg total) by mouth at bedtime. 12/27/14   Robyn Haber, MD   Meds Ordered and Administered this Visit  Medications - No data to display  BP 149/89 mmHg  Pulse 117  Temp(Src) 98.4 F (36.9 C) (Oral)  Resp 20  SpO2 99%  LMP 01/23/2015 (Approximate) No data found.   Physical Exam  Constitutional: She is oriented to person, place, and time. She appears well-developed and well-nourished. No distress.  Eyes: EOM are normal.  Neck: Normal range of motion. Neck supple.  Cardiovascular: Regular rhythm and normal heart sounds.   Apical rate 108.  Pulmonary/Chest: Effort normal. No respiratory distress.  Musculoskeletal: She exhibits no edema.  Neurological: She is alert and oriented to person, place, and time. She exhibits normal muscle tone.  Skin: Skin is warm and dry.  Psychiatric: She has a normal mood and affect.  Nursing note and vitals reviewed.   ED Course  Procedures (including critical care time)  Labs Review Labs Reviewed  GLUCOSE,  CAPILLARY - Abnormal; Notable for the following:    Glucose-Capillary 270 (*)    All other components within normal limits    Imaging Review No results found.   Visual Acuity Review  Right Eye Distance:   Left Eye Distance:   Bilateral Distance:    Right Eye Near:   Left Eye Near:    Bilateral Near:         MDM   1. Type 2 diabetes mellitus without complication, without long-term current  use of insulin ([place])    Follow-up with your PCP next week. Call Monday for an appointment. He may need adjustment in your medications. Rx written for Accu-Chek test strips #50. For development of symptoms or problems recheck promptly. I believe the patient is functioning at a low cognitive state with  low-level functioning and poor fund of knowledge.  Janne Napoleon, NP 02/23/15 1957 After the time of discharge the patient's brother called stating that he insisted on the patient having another medication told him that we did not manage medication management for diabetes. Apparently he states because she came here and Dr. Verline Lema changed her medicine that she was supposed to come here and have it readjusted. Due to insisting on another medication she is given Januvia 25 mg to take one daily #12. She is advised she must see her primary care doctor that we cannot manage her medications and her her diabetes in the future.  Janne Napoleon, NP 02/23/15 2015

## 2015-02-23 NOTE — ED Notes (Signed)
The patient presented to the [facility] with a complaint of hyperglycemia. The patient stated that the medication she is taking has not helped her. She also stated that she needed a prescription for test strips.s

## 2015-04-05 ENCOUNTER — Encounter (HOSPITAL_COMMUNITY): Payer: Self-pay | Admitting: Emergency Medicine

## 2015-04-05 ENCOUNTER — Emergency Department (INDEPENDENT_AMBULATORY_CARE_PROVIDER_SITE_OTHER)
Admission: EM | Admit: 2015-04-05 | Discharge: 2015-04-05 | Disposition: A | Discharge status: Home / Self Care | Payer: Medicaid Other | Source: Home / Self Care | Attending: Family Medicine | Admitting: Family Medicine

## 2015-04-05 DIAGNOSIS — E118 Type 2 diabetes mellitus with unspecified complications: Secondary | ICD-10-CM

## 2015-04-05 DIAGNOSIS — Z760 Encounter for issue of repeat prescription: Secondary | ICD-10-CM

## 2015-04-05 NOTE — ED Notes (Signed)
No answer in lobby x1

## 2015-04-05 NOTE — ED Notes (Signed)
The patient presented to the [facility] with a request for a medication refill on her glipizide, metformin, simvastatin and test strips.

## 2015-04-05 NOTE — Discharge Instructions (Signed)
Medicine Refill at the Emergency Department  We have refilled your medicine today, but it is best for you to get refills through your primary health care provider's office. In the future, please plan ahead so you do not need to get refills from the emergency department.  If the medicine we refilled was a maintenance medicine, you may have received only enough to get you by until you are able to see your regular health care provider.     This information is not intended to replace advice given to you by your health care provider. Make sure you discuss any questions you have with your health care provider.     Document Released: 07/11/2003 Document Revised: 04/14/2014 Document Reviewed: 07/01/2013  Elsevier Interactive Patient Education 2016 [employer] Inc[employer]

## 2015-04-05 NOTE — ED Provider Notes (Signed)
CSN: [id number]     Arrival date & time 04/05/15  1643 History   First MD Initiated Contact with Patient 04/05/15 1805     Chief Complaint  Patient presents with  . Medication Refill   (Consider location/radiation/quality/duration/timing/severity/associated sxs/prior Treatment) HPI History obtained from patient:   pt presentas for refill of metformin States she has been out of her medication for a few days Does not have a pcp in the area.  Past Medical History  Diagnosis Date  . Diabetes mellitus without complication ([place])   . Hypercholesterolemia    History reviewed. No pertinent past surgical history. History reviewed. No pertinent family history. Social History  Substance Use Topics  . Smoking status: Never Smoker   . Smokeless tobacco: None  . Alcohol Use: No   OB History    No data available     Review of Systems ROS +'veno complaints medication refill  Denies: HEADACHE, NAUSEA, ABDOMINAL PAIN, CHEST PAIN, CONGESTION, DYSURIA, SHORTNESS OF BREATH  Allergies  Review of patient's allergies indicates no known allergies.  Home Medications   Prior to Admission medications   Medication Sig Start Date End Date Taking? Authorizing Provider  glipiZIDE (GLUCOTROL) 5 MG tablet Take 1 tablet (5 mg total) by mouth 2 (two) times daily before a meal. 12/27/14   Robyn Haber, MD  glucose blood test strip Check your sugar daily at different times of the day. 02/23/15   Janne Napoleon, NP  metFORMIN (GLUCOPHAGE) 850 MG tablet Take 1 tablet (850 mg total) by mouth 2 (two) times daily with a meal. 12/27/14   Robyn Haber, MD  simvastatin (ZOCOR) 20 MG tablet Take 1 tablet (20 mg total) by mouth at bedtime. 12/27/14   Robyn Haber, MD  sitaGLIPtin (JANUVIA) 25 MG tablet Take 1 tablet (25 mg total) by mouth daily. 02/23/15   Janne Napoleon, NP   Meds Ordered and Administered this Visit  Medications - No data to display  BP 137/84 mmHg  Pulse 89  Temp(Src) 98.1 F (36.7 C) (Oral)   Resp 16  SpO2 98%  LMP 03/29/2015 (Approximate) No data found.   Physical Exam NURSES NOTES AND VITAL SIGNS REVIEWED. CONSTITUTIONAL: Well developed, well nourished, no acute distress HEENT: normocephalic, atraumatic EYES: Conjunctiva normal NECK:normal ROM, supple PULMONARY:No respiratory distress, normal effort, Lungs: CTAb/l CARDIOVASCULAR: RRR, no murmur ABDOMEN: soft, ND, NT, +'ve BS MUSCULOSKELETAL: Normal ROM of all extremities SKIN: warm and dry without rash PSYCHIATRIC: Mood and affect normal  ED Course  Procedures (including critical care time)  Labs Review Labs Reviewed - No data to display  Imaging Review No results found.   Visual Acuity Review  Right Eye Distance:   Left Eye Distance:   Bilateral Distance:    Right Eye Near:   Left Eye Near:    Bilateral Near:         MDM   1. Encounter for medication refill    Test strips renewed.  Pt has refills on all of her regularly prescribed medications She is advised the the UC is not the best place for primary care, and that she need to find a PCP.    Konrad Felix, PA 04/05/15 Kathyrn Drown

## 2017-02-07 ENCOUNTER — Emergency Department (HOSPITAL_COMMUNITY): Payer: Medicaid Other

## 2017-02-07 ENCOUNTER — Emergency Department (HOSPITAL_COMMUNITY)
Admission: EM | Admit: 2017-02-07 | Discharge: 2017-02-07 | Disposition: A | Discharge status: Home / Self Care | Payer: Medicaid Other | Attending: Emergency Medicine | Admitting: Emergency Medicine

## 2017-02-07 ENCOUNTER — Encounter (HOSPITAL_COMMUNITY): Payer: Self-pay | Admitting: Emergency Medicine

## 2017-02-07 DIAGNOSIS — R109 Unspecified abdominal pain: Secondary | ICD-10-CM

## 2017-02-07 DIAGNOSIS — R1909 Other intra-abdominal and pelvic swelling, mass and lump: Secondary | ICD-10-CM | POA: Insufficient documentation

## 2017-02-07 DIAGNOSIS — R112 Nausea with vomiting, unspecified: Secondary | ICD-10-CM

## 2017-02-07 DIAGNOSIS — E119 Type 2 diabetes mellitus without complications: Secondary | ICD-10-CM | POA: Insufficient documentation

## 2017-02-07 DIAGNOSIS — R1900 Intra-abdominal and pelvic swelling, mass and lump, unspecified site: Secondary | ICD-10-CM

## 2017-02-07 LAB — CBC WITH DIFFERENTIAL/PLATELET
BASOS ABS: 0 10*3/uL (ref 0.0–0.1)
Basophils Relative: 0 %
EOS ABS: 0 10*3/uL (ref 0.0–0.7)
EOS PCT: 0 %
HCT: 33.7 % — ABNORMAL LOW (ref 36.0–46.0)
Hemoglobin: 11.3 g/dL — ABNORMAL LOW (ref 12.0–15.0)
Lymphocytes Relative: 6 %
Lymphs Abs: 0.7 10*3/uL (ref 0.7–4.0)
MCH: 27.9 pg (ref 26.0–34.0)
MCHC: 33.5 g/dL (ref 30.0–36.0)
MCV: 83.2 fL (ref 78.0–100.0)
Monocytes Absolute: 0.6 10*3/uL (ref 0.1–1.0)
Monocytes Relative: 5 %
NEUTROS PCT: 89 %
Neutro Abs: 9.8 10*3/uL — ABNORMAL HIGH (ref 1.7–7.7)
PLATELETS: ADEQUATE 10*3/uL (ref 150–400)
RBC: 4.05 MIL/uL (ref 3.87–5.11)
RDW: 14 % (ref 11.5–15.5)
WBC: 11.1 10*3/uL — AB (ref 4.0–10.5)

## 2017-02-07 LAB — COMPREHENSIVE METABOLIC PANEL
ALBUMIN: 3.4 g/dL — AB (ref 3.5–5.0)
ALT: 13 U/L — ABNORMAL LOW (ref 14–54)
AST: 20 U/L (ref 15–41)
Alkaline Phosphatase: 60 U/L (ref 38–126)
Anion gap: 17 — ABNORMAL HIGH (ref 5–15)
BILIRUBIN TOTAL: 1.4 mg/dL — AB (ref 0.3–1.2)
BUN: 16 mg/dL (ref 6–20)
CO2: 16 mmol/L — ABNORMAL LOW (ref 22–32)
Calcium: 9.1 mg/dL (ref 8.9–10.3)
Chloride: 102 mmol/L (ref 101–111)
Creatinine, Ser: 0.66 mg/dL (ref 0.44–1.00)
GFR calc Af Amer: >60 mL/min (ref >60)
GFR calc non Af Amer: >60 mL/min (ref >60)
GLUCOSE: 419 mg/dL — AB (ref 65–99)
POTASSIUM: 4.4 mmol/L (ref 3.5–5.1)
Sodium: 135 mmol/L (ref 135–145)
TOTAL PROTEIN: 8.2 g/dL — AB (ref 6.5–8.1)

## 2017-02-07 LAB — URINALYSIS, ROUTINE W REFLEX MICROSCOPIC
Bacteria, UA: NONE SEEN
Bilirubin Urine: NEGATIVE
KETONES UR: 80 mg/dL — AB
NITRITE: NEGATIVE
PH: 5 (ref 5.0–8.0)
Protein, ur: 100 mg/dL — AB
Specific Gravity, Urine: 1.029 (ref 1.005–1.030)

## 2017-02-07 LAB — LIPASE, BLOOD: Lipase: 19 U/L (ref 11–51)

## 2017-02-07 MED ORDER — MORPHINE SULFATE (PF) 4 MG/ML IV SOLN
4.0000 mg | Freq: Once | INTRAVENOUS | Status: AC
  Administered 2017-02-07: 4 mg via INTRAVENOUS
  Filled 2017-02-07: qty 1

## 2017-02-07 MED ORDER — ONDANSETRON HCL 4 MG/2ML IJ SOLN
4.0000 mg | Freq: Once | INTRAMUSCULAR | Status: AC
  Administered 2017-02-07: 4 mg via INTRAVENOUS
  Filled 2017-02-07: qty 2

## 2017-02-07 MED ORDER — IOPAMIDOL (ISOVUE-300) INJECTION 61%
INTRAVENOUS | Status: AC
  Filled 2017-02-07: qty 100

## 2017-02-07 MED ORDER — ONDANSETRON HCL 4 MG PO TABS: 4.0000 mg | ORAL_TABLET | Freq: Four times a day (QID) | ORAL | 0 refills | Status: DC

## 2017-02-07 MED ORDER — SODIUM CHLORIDE 0.9 % IV BOLUS (SEPSIS)
1000.0000 mL | Freq: Once | INTRAVENOUS | Status: AC
Start: 2017-02-07 — End: 2017-02-07
  Administered 2017-02-07: 1000 mL via INTRAVENOUS

## 2017-02-07 MED ORDER — IOPAMIDOL (ISOVUE-300) INJECTION 61%
100.0000 mL | Freq: Once | INTRAVENOUS | Status: AC | PRN
  Administered 2017-02-07: 100 mL via INTRAVENOUS

## 2017-02-07 MED ORDER — OXYCODONE-ACETAMINOPHEN 5-325 MG PO TABS: 1.0000 | ORAL_TABLET | ORAL | 0 refills | Status: DC | PRN

## 2017-02-07 NOTE — ED Notes (Signed)
Unsuccessful IV attempt x2.  

## 2017-02-07 NOTE — ED Provider Notes (Addendum)
[facility] DEPT Provider Note   CSN: [id number] Arrival date & time: 02/07/17  1226     History   Chief Complaint Chief Complaint  Patient presents with  . Abdominal Pain    HPI Ingri Church is a 41 y.o. female.  Level 5 caveat for inability to give a full history.  Most of history obtained from brother.  Patient complains of worsening abdominal pain, nausea and vomiting.  She has a known ventral hernia.  Status post cholecystectomy 2009.  Decreased oral intake the last 2-3 days.  Severity of symptoms is moderate.      Past Medical History:  Diagnosis Date  . Diabetes mellitus without complication ([place])   . Hypercholesterolemia     There are no active problems to display for this patient.   History reviewed. No pertinent surgical history.  OB History    No data available       Home Medications    Prior to Admission medications   Not on File    Family History No family history on file.  Social History Social History  Substance Use Topics  . Smoking status: Never Smoker  . Smokeless tobacco: Never Used  . Alcohol use No     Allergies   Patient has no known allergies.   Review of Systems Review of Systems  Unable to perform ROS: Acuity of condition     Physical Exam Updated Vital Signs BP 116/67   Pulse (!) 103   Temp 99.1 F (37.3 C) (Oral)   Resp 15   SpO2 100%   Physical Exam  Constitutional: She is oriented to person, place, and time.  alert  HENT:  Head: Normocephalic and atraumatic.  Eyes: Conjunctivae are normal.  Neck: Neck supple.  Cardiovascular: Normal rate and regular rhythm.   Pulmonary/Chest: Effort normal and breath sounds normal.  Abdominal:  Distended;  Obvious large ventral hernia  Musculoskeletal: Normal range of motion.  Neurological: She is alert and oriented to person, place, and time.  Skin: Skin is warm and dry.  Psychiatric: She has a normal mood and affect. Her  behavior is normal.  Nursing note and vitals reviewed.    ED Treatments / Results  Labs (all labs ordered are listed, but only abnormal results are displayed) Labs Reviewed  CBC WITH DIFFERENTIAL/PLATELET - Abnormal; Notable for the following:       Result Value   WBC 11.1 (*)    Hemoglobin 11.3 (*)    HCT 33.7 (*)    Neutro Abs 9.8 (*)    All other components within normal limits  URINALYSIS, ROUTINE W REFLEX MICROSCOPIC - Abnormal; Notable for the following:    Glucose, UA >=500 (*)    Hgb urine dipstick SMALL (*)    Ketones, ur 80 (*)    Protein, ur 100 (*)    Leukocytes, UA TRACE (*)    Squamous Epithelial / LPF 0-5 (*)    All other components within normal limits  COMPREHENSIVE METABOLIC PANEL - Abnormal; Notable for the following:    CO2 16 (*)    Glucose, Bld 419 (*)    Total Protein 8.2 (*)    Albumin 3.4 (*)    ALT 13 (*)    Total Bilirubin 1.4 (*)    Anion gap 17 (*)    All other components within normal limits  LIPASE, BLOOD  CA 125    EKG  EKG Interpretation None       Radiology Ct Abdomen  Pelvis W Contrast  Result Date: 02/07/2017 CLINICAL DATA:  Patient here with complaints of abdominal pain. Also states that she has acid reflux. Nausea/vomiting. Diabetic. ^139mL ISOVUE-300 IOPAMIDOL (ISOVUE-300) INJECTION 61% EXAM: CT ABDOMEN AND PELVIS WITH CONTRAST TECHNIQUE: Multidetector CT imaging of the abdomen and pelvis was performed using the standard protocol following bolus administration of intravenous contrast. CONTRAST:  165mL ISOVUE-300 IOPAMIDOL (ISOVUE-300) INJECTION 61% COMPARISON:  Ultrasound, 04/08/2007. FINDINGS: Lower chest: Clear lung bases.  Heart normal in size. Hepatobiliary: No focal liver abnormality is seen. Status post cholecystectomy. No biliary dilatation. Pancreas: Unremarkable. No pancreatic ductal dilatation or surrounding inflammatory changes. Spleen: Normal in size without focal abnormality. Adrenals/Urinary Tract: Adrenal glands are  unremarkable. Kidneys are normal, without renal calculi, focal lesion, or hydronephrosis. Bladder is unremarkable. Stomach/Bowel: No bowel obstruction. No wall thickening or inflammatory change. Colon and small bowel is displaced by a large mass in the central peritoneal cavity extending from the pelvis. Appendix not visualized. Vascular/Lymphatic: Vessels are unremarkable. No discrete pathologically enlarged lymph nodes. Reproductive: There is a large heterogeneous solid mass that extends from the right pelvis into the abdomen occupying much of the peritoneal cavity. Mass measures 21 x 12 x 19 cm. There is a smaller mass with a significant cystic component that extends from the left upper pelvis along the left retroperitoneum. It measures 10 x 7 x 8 cm. The uterus is deviated to the left. The uterine fundus is contiguous with the larger mass. There is a small cystic component along the right inferolateral margin of the larger mass with a small amount of adjacent fluid. Other: There is a paraumbilical hernia containing fat with increased attenuation and multiple prominent vessels. The hernia measures 8 x 4 x 7 cm. Musculoskeletal: No acute or significant osseous findings. IMPRESSION: 1. Large masses as detailed, which are likely gynecologic in origin. The larger mass, which occupies much of the peritoneal cavity, extends from the right pelvis, measuring 21 cm in greatest dimension. It is contiguous with the uterine fundus. A massive exophytic fibroid is possible. Malignant degeneration should be considered given the size. Alternatively, this may reflect a solid ovarian mass. The smaller mass extends from the pelvis to the left lower quadrant retroperitoneum, with a significant cystic component. It measures 8 cm in greatest dimension. Ovarian origin suspected. 2. 8 cm paraumbilical hernia. Fat within the hernia has increased attenuation consistent with congestion. The hernia lies anterior to the large peritoneal  mass. 3. Status post cholecystectomy. No other abnormalities. Electronically Signed   By: Lajean Manes M.D.   On: 02/07/2017 15:54    Procedures Procedures (including critical care time)  Medications Ordered in ED Medications  sodium chloride 0.9 % bolus 1,000 mL (1,000 mLs Intravenous New Bag/Given 02/07/17 1401)  ondansetron (ZOFRAN) injection 4 mg (4 mg Intravenous Given 02/07/17 1401)  morphine 4 MG/ML injection 4 mg (4 mg Intravenous Given 02/07/17 1402)  iopamidol (ISOVUE-300) 61 % injection 100 mL (100 mLs Intravenous Contrast Given 02/07/17 1514)  morphine 4 MG/ML injection 4 mg (4 mg Intravenous Given 02/07/17 1656)     Initial Impression / Assessment and Plan / ED Course  I have reviewed the triage vital signs and the nursing notes.  Pertinent labs & imaging results that were available during my care of the patient were reviewed by me and considered in my medical decision making (see chart for details).     The patient presents with worsening abdominal pain, nausea and vomiting.  She has an obvious ventral hernia.  CT of abdomen/pelvis reveals a large pelvic mass. She is in mild DKA.  She feels much better after IV fluids.  She is ambulatory with no evidence of dehydration.  Discussed with OB/GYN on-call Dr. Rip Harbour and GYN oncologist Dr. Denman George.  Dr. Denman George will follow up with her as an outpatient.  All these findings were discussed with her brother who was her helper.  Discharge medications Percocet and Zofran 4 mg.  Awaiting pelvic ultrasound report.  Disc c Dr Lita Mains  Final Clinical Impressions(s) / ED Diagnoses   Final diagnoses:  Pelvic mass  Abdominal pain, unspecified abdominal location  Nausea and vomiting, intractability of vomiting not specified, unspecified vomiting type    New Prescriptions New Prescriptions   No medications on file     Nat Christen, MD 02/07/17 [place]    Nat Christen, MD 02/07/17 2119    Nat Christen, MD 02/08/17 1055

## 2017-02-07 NOTE — ED Notes (Signed)
US at bedside

## 2017-02-07 NOTE — Discharge Instructions (Signed)
I spoke to a specialist about your condition Dr. Denman George.   Her office will call you early in the week for a follow-up appointment.  Medication for pain and nausea.

## 2017-02-07 NOTE — ED Triage Notes (Signed)
Patient here with complaints of abdominal pain. Also states that she has acid reflux. Nausea/vomiting. Diabetic.

## 2017-02-09 ENCOUNTER — Inpatient Hospital Stay (HOSPITAL_COMMUNITY)
Admission: EM | Admit: 2017-02-09 | Discharge: 2017-02-10 | DRG: 637 | Disposition: A | Discharge status: Other Acute Inpatient Hospital | Payer: Medicaid Other | Attending: Family Medicine | Admitting: Family Medicine

## 2017-02-09 ENCOUNTER — Telehealth: Payer: Self-pay | Admitting: *Deleted

## 2017-02-09 ENCOUNTER — Encounter (HOSPITAL_COMMUNITY): Payer: Self-pay | Admitting: Family Medicine

## 2017-02-09 DIAGNOSIS — E43 Unspecified severe protein-calorie malnutrition: Secondary | ICD-10-CM | POA: Diagnosis not present

## 2017-02-09 DIAGNOSIS — Z9049 Acquired absence of other specified parts of digestive tract: Secondary | ICD-10-CM

## 2017-02-09 DIAGNOSIS — K439 Ventral hernia without obstruction or gangrene: Secondary | ICD-10-CM

## 2017-02-09 DIAGNOSIS — Z87898 Personal history of other specified conditions: Secondary | ICD-10-CM | POA: Diagnosis not present

## 2017-02-09 DIAGNOSIS — E875 Hyperkalemia: Secondary | ICD-10-CM | POA: Diagnosis not present

## 2017-02-09 DIAGNOSIS — R627 Adult failure to thrive: Secondary | ICD-10-CM | POA: Diagnosis not present

## 2017-02-09 DIAGNOSIS — Z5900 Homelessness unspecified: Secondary | ICD-10-CM

## 2017-02-09 DIAGNOSIS — Z23 Encounter for immunization: Secondary | ICD-10-CM | POA: Diagnosis not present

## 2017-02-09 DIAGNOSIS — Z681 Body mass index (BMI) 19 or less, adult: Secondary | ICD-10-CM

## 2017-02-09 DIAGNOSIS — E861 Hypovolemia: Secondary | ICD-10-CM | POA: Diagnosis not present

## 2017-02-09 DIAGNOSIS — K429 Umbilical hernia without obstruction or gangrene: Secondary | ICD-10-CM | POA: Diagnosis not present

## 2017-02-09 DIAGNOSIS — I10 Essential (primary) hypertension: Secondary | ICD-10-CM | POA: Diagnosis not present

## 2017-02-09 DIAGNOSIS — E871 Hypo-osmolality and hyponatremia: Secondary | ICD-10-CM

## 2017-02-09 DIAGNOSIS — R1900 Intra-abdominal and pelvic swelling, mass and lump, unspecified site: Secondary | ICD-10-CM

## 2017-02-09 DIAGNOSIS — E1110 Type 2 diabetes mellitus with ketoacidosis without coma: Principal | ICD-10-CM | POA: Diagnosis present

## 2017-02-09 DIAGNOSIS — C569 Malignant neoplasm of unspecified ovary: Secondary | ICD-10-CM | POA: Diagnosis not present

## 2017-02-09 DIAGNOSIS — K219 Gastro-esophageal reflux disease without esophagitis: Secondary | ICD-10-CM | POA: Diagnosis not present

## 2017-02-09 DIAGNOSIS — K436 Other and unspecified ventral hernia with obstruction, without gangrene: Secondary | ICD-10-CM | POA: Diagnosis present

## 2017-02-09 DIAGNOSIS — Z79899 Other long term (current) drug therapy: Secondary | ICD-10-CM

## 2017-02-09 DIAGNOSIS — Z9114 Patient's other noncompliance with medication regimen: Secondary | ICD-10-CM | POA: Diagnosis not present

## 2017-02-09 DIAGNOSIS — R109 Unspecified abdominal pain: Secondary | ICD-10-CM | POA: Diagnosis not present

## 2017-02-09 DIAGNOSIS — E7800 Pure hypercholesterolemia, unspecified: Secondary | ICD-10-CM | POA: Diagnosis present

## 2017-02-09 DIAGNOSIS — R229 Localized swelling, mass and lump, unspecified: Secondary | ICD-10-CM | POA: Diagnosis not present

## 2017-02-09 HISTORY — DX: Essential (primary) hypertension: I10

## 2017-02-09 HISTORY — DX: Gastro-esophageal reflux disease without esophagitis: K21.9

## 2017-02-09 HISTORY — DX: Ventral hernia without obstruction or gangrene: K43.9

## 2017-02-09 LAB — CBC
HCT: 34 % — ABNORMAL LOW (ref 36.0–46.0)
HEMOGLOBIN: 11.7 g/dL — AB (ref 12.0–15.0)
MCH: 29.3 pg (ref 26.0–34.0)
MCHC: 34.4 g/dL (ref 30.0–36.0)
MCV: 85 fL (ref 78.0–100.0)
PLATELETS: 367 10*3/uL (ref 150–400)
RBC: 4 MIL/uL (ref 3.87–5.11)
RDW: 14 % (ref 11.5–15.5)
WBC: 10.7 10*3/uL — ABNORMAL HIGH (ref 4.0–10.5)

## 2017-02-09 LAB — URINALYSIS, ROUTINE W REFLEX MICROSCOPIC
Bacteria, UA: NONE SEEN
Bilirubin Urine: NEGATIVE
Glucose, UA: >=500 mg/dL — AB
Ketones, ur: 80 mg/dL — AB
NITRITE: NEGATIVE
PH: 5 (ref 5.0–8.0)
Protein, ur: 100 mg/dL — AB
Specific Gravity, Urine: 1.022 (ref 1.005–1.030)

## 2017-02-09 LAB — CBG MONITORING, ED
GLUCOSE-CAPILLARY: 339 mg/dL — AB (ref 65–99)
GLUCOSE-CAPILLARY: 299 mg/dL — AB (ref 65–99)

## 2017-02-09 LAB — BLOOD GAS, VENOUS
ACID-BASE DEFICIT: 21.1 mmol/L — AB (ref 0.0–2.0)
BICARBONATE: 6.6 mmol/L — AB (ref 20.0–28.0)
FIO2: 21
O2 Saturation: 74.9 %
PH VEN: 7.153 — AB (ref 7.250–7.430)
PO2 VEN: 43.8 mmHg (ref 32.0–45.0)
Patient temperature: 97.7
pCO2, Ven: 19.4 mmHg — CL (ref 44.0–60.0)

## 2017-02-09 LAB — COMPREHENSIVE METABOLIC PANEL
ALBUMIN: 3.4 g/dL — AB (ref 3.5–5.0)
ALT: 10 U/L — ABNORMAL LOW (ref 14–54)
AST: 13 U/L — AB (ref 15–41)
Alkaline Phosphatase: 76 U/L (ref 38–126)
Anion gap: 19 — ABNORMAL HIGH (ref 5–15)
BUN: 24 mg/dL — AB (ref 6–20)
CALCIUM: 9.2 mg/dL (ref 8.9–10.3)
CHLORIDE: 103 mmol/L (ref 101–111)
CO2: 9 mmol/L — AB (ref 22–32)
Creatinine, Ser: 0.77 mg/dL (ref 0.44–1.00)
GFR calc Af Amer: >60 mL/min (ref >60)
GLUCOSE: 380 mg/dL — AB (ref 65–99)
POTASSIUM: 5.2 mmol/L — AB (ref 3.5–5.1)
SODIUM: 131 mmol/L — AB (ref 135–145)
TOTAL PROTEIN: 8.9 g/dL — AB (ref 6.5–8.1)
Total Bilirubin: 1.2 mg/dL (ref 0.3–1.2)

## 2017-02-09 LAB — ETHANOL: Alcohol, Ethyl (B): <10 mg/dL (ref <10)

## 2017-02-09 LAB — GLUCOSE, CAPILLARY: Glucose-Capillary: 245 mg/dL — ABNORMAL HIGH (ref 65–99)

## 2017-02-09 LAB — PREGNANCY, URINE: Preg Test, Ur: NEGATIVE

## 2017-02-09 LAB — LIPASE, BLOOD: LIPASE: 21 U/L (ref 11–51)

## 2017-02-09 MED ORDER — INFLUENZA VAC SPLIT QUAD 0.5 ML IM SUSY
0.5000 mL | PREFILLED_SYRINGE | INTRAMUSCULAR | Status: AC
  Administered 2017-02-10: 0.5 mL via INTRAMUSCULAR

## 2017-02-09 MED ORDER — SODIUM CHLORIDE 0.9 % IV SOLN
INTRAVENOUS | Status: AC
  Administered 2017-02-09: 999 mL/h via INTRAVENOUS

## 2017-02-09 MED ORDER — PNEUMOCOCCAL VAC POLYVALENT 25 MCG/0.5ML IJ INJ
0.5000 mL | INJECTION | INTRAMUSCULAR | Status: AC
  Administered 2017-02-10: 0.5 mL via INTRAMUSCULAR
  Filled 2017-02-09: qty 0.5

## 2017-02-09 MED ORDER — DEXTROSE-NACL 5-0.45 % IV SOLN: INTRAVENOUS | Status: DC

## 2017-02-09 MED ORDER — SODIUM CHLORIDE 0.9 % IV SOLN
INTRAVENOUS | Status: DC
  Administered 2017-02-09: 2.8 [IU]/h via INTRAVENOUS
  Filled 2017-02-09: qty 1

## 2017-02-09 MED ORDER — ENOXAPARIN SODIUM 40 MG/0.4ML ~~LOC~~ SOLN
40.0000 mg | Freq: Every day | SUBCUTANEOUS | Status: DC
  Administered 2017-02-09: 40 mg via SUBCUTANEOUS
  Filled 2017-02-09: qty 0.4

## 2017-02-09 MED ORDER — ENOXAPARIN SODIUM 40 MG/0.4ML ~~LOC~~ SOLN
40.0000 mg | Freq: Every day | SUBCUTANEOUS | Status: DC
  Filled 2017-02-09: qty 0.4

## 2017-02-09 MED ORDER — LIP MEDEX EX OINT
TOPICAL_OINTMENT | CUTANEOUS | Status: AC
  Filled 2017-02-09: qty 7

## 2017-02-09 MED ORDER — SODIUM CHLORIDE 0.9 % IV BOLUS (SEPSIS)
1000.0000 mL | Freq: Once | INTRAVENOUS | Status: AC
  Administered 2017-02-09: 1000 mL via INTRAVENOUS

## 2017-02-09 MED ORDER — DEXTROSE-NACL 5-0.45 % IV SOLN
INTRAVENOUS | Status: DC
  Administered 2017-02-10: 100 mL/h via INTRAVENOUS
  Administered 2017-02-10: 09:00:00 via INTRAVENOUS

## 2017-02-09 MED ORDER — SODIUM CHLORIDE 0.9 % IV SOLN: INTRAVENOUS | Status: DC

## 2017-02-09 MED ORDER — ENSURE ENLIVE PO LIQD
237.0000 mL | Freq: Two times a day (BID) | ORAL | Status: DC
  Administered 2017-02-10 (×2): 237 mL via ORAL

## 2017-02-09 NOTE — H&P (Signed)
History and Physical    [name] MHD:[id number] DOB: 06-11-1975 DOA: 02/09/2017  PCP: Patient, No Pcp Per; confirmed with patient   Patient coming from: [place] (homeless)  Chief Complaint: Abdominal pain   HPI: [name] is a 41 y.o. female with medical history significant for diabetes mellitus and ventral hernia, presenting to the emergency department for evaluation of abdominal pain.  Patient reports constant moderate burning abdominal pain for the past 3-4 days associated with nausea and nonbloody vomiting, but no diarrhea or fevers.  She was evaluated for these complaints in the emergency department 2 days ago, noted to have a large pelvic mass on CT, and outpatient follow-up was arranged with gynecologic surgeon.  Patient was prescribed oxycodone and Zofran and discharged from the emergency department, but continues to experience these symptoms which have worsened slightly since leaving the ED 2 days ago. Patient is homeless, does not have a PCP, and does not have any medications for her diabetes.   ED Course: Upon arrival to the ED, patient is found to be afebrile, saturating well on room air, slightly tachycardic, and with stable blood pressure.  Chemistry panel reveals a sodium of 131, potassium 5.2, bicarbonate 9, anion gap 19, and elevated BUN to creatinine ratio.  CBC features a mild leukocytosis to 10,700 and a mild normocytic anemia with hemoglobin of 11.7.  Urinalysis features glucosuria and ketonuria.  Ethanol level is undetectable.  Patient was treated with a liter of normal saline and started on weekly stable, not in any apparent respiratory distress, and will be admitted to the stepdown unit for ongoing evaluation and management of diabetic ketoacidosis in a diabetic not taking any medications for this.  Review of Systems:  All other systems reviewed and apart from HPI, are negative.  Past Medical History:  Diagnosis Date  . Diabetes mellitus without  complication ([place])   . GERD (gastroesophageal reflux disease)   . Hypercholesterolemia   . Hypertension   . Ventral hernia     Past Surgical History:  Procedure Laterality Date  . CHOLECYSTECTOMY    . HAND SURGERY Right      reports that  has never smoked. she has never used smokeless tobacco. She reports that she does not drink alcohol or use drugs.  No Known Allergies  History reviewed. No pertinent family history.   Prior to Admission medications   Medication Sig Start Date End Date Taking? Authorizing Provider  ondansetron (ZOFRAN) 4 MG tablet Take 1 tablet (4 mg total) by mouth every 6 (six) hours. 02/07/17   Nat Christen, MD  oxyCODONE-acetaminophen (PERCOCET) 5-325 MG tablet Take 1 tablet by mouth every 4 (four) hours as needed. 02/07/17   Nat Christen, MD    Physical Exam: Vitals:   02/09/17 2130 02/09/17 2148 02/09/17 2153 02/09/17 2200  BP: 135/81  135/81 (!) 145/86  Pulse: (!) 113  (!) 108 (!) 109  Resp:   18   Temp:   97.7 F (36.5 C)   TempSrc:   Oral   SpO2: 100%  100% 100%  Weight:  47.6 kg (105 lb)    Height:  4\' 9"  (1.448 m)        Constitutional: Diminutive, calm  Eyes: PERTLA, lids and conjunctivae normal ENMT: Mucous membranes are moist. Posterior pharynx clear of any exudate or lesions.   Neck: normal, supple, no masses, no thyromegaly Respiratory: clear to auscultation bilaterally, no wheezing, no crackles. Mild tachypnea.  Cardiovascular: Rate ~110 and regular. No extremity edema. . No significant JVD.  Abdomen: Soft, no distension, ventral hernia without heat or erythema. Mild generalized tenderness without rebound pain or guarding. Bowel sounds active.  Musculoskeletal: no clubbing / cyanosis. Left hand deformities and post-surgical changes.     Skin: no significant rashes, lesions, ulcers. Warm, dry, well-perfused. Neurologic: CN 2-12 grossly intact. Sensation intact. Strength 5/5 in all 4 limbs.  Psychiatric: Alert and oriented x 3. Calm,  cooperative.     Labs on Admission: I have personally reviewed following labs and imaging studies  CBC: Recent Labs  Lab 02/07/17 1321 02/09/17 1356  WBC 11.1* 10.7*  NEUTROABS 9.8*  --   HGB 11.3* 11.7*  HCT 33.7* 34.0*  MCV 83.2 85.0  PLT PLATELET CLUMPS NOTED ON SMEAR, COUNT APPEARS ADEQUATE 062   Basic Metabolic Panel: Recent Labs  Lab 02/07/17 1351 02/09/17 1356  NA 135 131*  K 4.4 5.2*  CL 102 103  CO2 16* 9*  GLUCOSE 419* 380*  BUN 16 24*  CREATININE 0.66 0.77  CALCIUM 9.1 9.2   GFR: Estimated Creatinine Clearance: 61 mL/min (by C-G formula based on SCr of 0.77 mg/dL). Liver Function Tests: Recent Labs  Lab 02/07/17 1351 02/09/17 1356  AST 20 13*  ALT 13* 10*  ALKPHOS 60 76  BILITOT 1.4* 1.2  PROT 8.2* 8.9*  ALBUMIN 3.4* 3.4*   Recent Labs  Lab 02/07/17 1351 02/09/17 1356  LIPASE 19 21   No results for input(s): AMMONIA in the last 168 hours. Coagulation Profile: No results for input(s): INR, PROTIME in the last 168 hours. Cardiac Enzymes: No results for input(s): CKTOTAL, CKMB, CKMBINDEX, TROPONINI in the last 168 hours. BNP (last 3 results) No results for input(s): PROBNP in the last 8760 hours. HbA1C: No results for input(s): HGBA1C in the last 72 hours. CBG: Recent Labs  Lab 02/09/17 2138  GLUCAP 339*   Lipid Profile: No results for input(s): CHOL, HDL, LDLCALC, TRIG, CHOLHDL, LDLDIRECT in the last 72 hours. Thyroid Function Tests: No results for input(s): TSH, T4TOTAL, FREET4, T3FREE, THYROIDAB in the last 72 hours. Anemia Panel: No results for input(s): VITAMINB12, FOLATE, FERRITIN, TIBC, IRON, RETICCTPCT in the last 72 hours. Urine analysis:    Component Value Date/Time   COLORURINE YELLOW 02/09/2017 2115   APPEARANCEUR CLEAR 02/09/2017 2115   LABSPEC 1.022 02/09/2017 2115   PHURINE 5.0 02/09/2017 2115   GLUCOSEU >=500 (A) 02/09/2017 2115   HGBUR LARGE (A) 02/09/2017 2115   BILIRUBINUR NEGATIVE 02/09/2017 2115    KETONESUR 80 (A) 02/09/2017 2115   PROTEINUR 100 (A) 02/09/2017 2115   UROBILINOGEN 0.2 01/15/2007 2203   NITRITE NEGATIVE 02/09/2017 2115   LEUKOCYTESUR TRACE (A) 02/09/2017 2115   Sepsis Labs: @LABRCNTIP (procalcitonin:4,lacticidven:4) )No results found for this or any previous visit (from the past 240 hour(s)).   Radiological Exams on Admission: No results found.  EKG: Not performed.   Assessment/Plan  1. Diabetic ketoacidosis  - Pt has hx of DM, A1c 11.7% remotely  - Does not have PCP or any medications for diabetes; social work consultation requested for this  - Presents with abd pain, N/V, found to have hyperglycemia with metabolic acidosis and ketonuria  - Given 1 L NS in ED and started on insulin infusion  - Plan to continue fluid-resuscitation with NS, continue insulin gtt with frequent CBG's and serial chemistry panels    2. Hyponatremia  - Serum sodium is 131 in setting of hyperglycemia and hypovolemia  - Anticipate resolution with IVF and insulin  - Follow serial chem panels   3.  Hyperkalemia - Serum potassium slightly elevated in setting of DKA  - Anticipate resolution with IVF and insulin    - Continue cardiac monitoring and serial chemistry panels   4. Pelvic mass  - Large pelvic mass noted on CT abd in ED on 11/3, possibly fibroid  - She was given a referral for gynecologic surgeon     DVT prophylaxis: Lovenox  Code Status: Full  Family Communication: Discussed with patient Disposition Plan: Admit to SDU Consults called: None Admission status: Inpatient    Vianne Bulls, MD [facility] Pager [phone]  If 7PM-7AM, please contact night-coverage [email or web address] Password [email or web address]  02/09/2017, 10:28 PM

## 2017-02-09 NOTE — ED Notes (Signed)
ED TO INPATIENT HANDOFF REPORT  Name/Age/Gender [name] 41 y.o. female  Code Status    Code Status Orders  (From admission, onward)        Start     Ordered   02/09/17 2226  Full code  Continuous     02/09/17 2227    Code Status History    Date Active Date Inactive Code Status Order ID Comments User Context   This patient has a current code status but no historical code status.      Home/SNF/Other Home  Chief Complaint abd pain  Level of Care/Admitting Diagnosis ED Disposition    ED Disposition Condition Comment   Admit  Hospital Area: [facility] [[id number]]  Level of Care: Stepdown [14]  Admit to SDU based on following criteria: Severe physiological/psychological symptoms:  Any diagnosis requiring assessment & intervention at least every 4 hours on an ongoing basis to obtain desired patient outcomes including stability and rehabilitation  Diagnosis: DKA (diabetic ketoacidoses) [facility]) [[id number]]  Admitting Physician: Vianne Bulls [[id number]]  Attending Physician: Vianne Bulls [[id number]]  Estimated length of stay: past midnight tomorrow  Certification:: I certify this patient will need inpatient services for at least 2 midnights  PT Class (Do Not Modify): Inpatient [101]  PT Acc Code (Do Not Modify): Private [1]       Medical History Past Medical History:  Diagnosis Date  . Diabetes mellitus without complication ([place])   . GERD (gastroesophageal reflux disease)   . Hypercholesterolemia   . Hypertension   . Ventral hernia     Allergies No Known Allergies  IV Location/Drains/Wounds Patient Lines/Drains/Airways Status   Active Line/Drains/Airways    None          Labs/Imaging Results for orders placed or performed during the hospital encounter of 02/09/17 (from the past 48 hour(s))  Lipase, blood     Status: None   Collection Time: 02/09/17  1:56 PM  Result Value Ref Range   Lipase 21 11 - 51 U/L  Comprehensive  metabolic panel     Status: Abnormal   Collection Time: 02/09/17  1:56 PM  Result Value Ref Range   Sodium 131 (L) 135 - 145 mmol/L   Potassium 5.2 (H) 3.5 - 5.1 mmol/L   Chloride 103 101 - 111 mmol/L   CO2 9 (L) 22 - 32 mmol/L   Glucose, Bld 380 (H) 65 - 99 mg/dL   BUN 24 (H) 6 - 20 mg/dL   Creatinine, Ser 0.77 0.44 - 1.00 mg/dL   Calcium 9.2 8.9 - 10.3 mg/dL   Total Protein 8.9 (H) 6.5 - 8.1 g/dL   Albumin 3.4 (L) 3.5 - 5.0 g/dL   AST 13 (L) 15 - 41 U/L   ALT 10 (L) 14 - 54 U/L   Alkaline Phosphatase 76 38 - 126 U/L   Total Bilirubin 1.2 0.3 - 1.2 mg/dL   GFR calc non Af Amer >60 >60 mL/min   GFR calc Af Amer >60 >60 mL/min    Comment: (NOTE) The eGFR has been calculated using the CKD EPI equation. This calculation has not been validated in all clinical situations. eGFR's persistently <60 mL/min signify possible Chronic Kidney Disease.    Anion gap 19 (H) 5 - 15  CBC     Status: Abnormal   Collection Time: 02/09/17  1:56 PM  Result Value Ref Range   WBC 10.7 (H) 4.0 - 10.5 K/uL   RBC 4.00 3.87 - 5.11 MIL/uL  Hemoglobin 11.7 (L) 12.0 - 15.0 g/dL   HCT 34.0 (L) 36.0 - 46.0 %   MCV 85.0 78.0 - 100.0 fL   MCH 29.3 26.0 - 34.0 pg   MCHC 34.4 30.0 - 36.0 g/dL   RDW 14.0 11.5 - 15.5 %   Platelets 367 150 - 400 K/uL  Blood gas, venous     Status: Abnormal   Collection Time: 02/09/17  8:07 PM  Result Value Ref Range   FIO2 21.00    pH, Ven 7.153 (LL) 7.250 - 7.430    Comment: CRITICAL RESULT CALLED TO, READ BACK BY AND VERIFIED WITH: Isla Pence, MD AT 2015 ON 02/09/2017 BY DANIEL JONES, RRT, RCP    pCO2, Ven 19.4 (LL) 44.0 - 60.0 mmHg    Comment: CRITICAL RESULT CALLED TO, READ BACK BY AND VERIFIED WITH: JULIE HAVILAND, MD AT 2015 ON 02/09/2017 BY DANIEL JONES, RRT, RCP    pO2, Ven 43.8 32.0 - 45.0 mmHg   Bicarbonate 6.6 (L) 20.0 - 28.0 mmol/L    Comment: CRITICAL RESULT CALLED TO, READ BACK BY AND VERIFIED WITH: Isla Pence, MD AT 2015 ON 02/09/2017 BY DANIEL  JONES, RRT, RCP    Acid-base deficit 21.1 (H) 0.0 - 2.0 mmol/L   O2 Saturation 74.9 %   Patient temperature 97.7    Collection site VEIN    Drawn by DRAWN BY RN    Sample type VENOUS   Ethanol     Status: None   Collection Time: 02/09/17  8:08 PM  Result Value Ref Range   Alcohol, Ethyl (B) <10 <10 mg/dL    Comment:        LOWEST DETECTABLE LIMIT FOR SERUM ALCOHOL IS 10 mg/dL FOR MEDICAL PURPOSES ONLY   Urinalysis, Routine w reflex microscopic     Status: Abnormal   Collection Time: 02/09/17  9:15 PM  Result Value Ref Range   Color, Urine YELLOW YELLOW   APPearance CLEAR CLEAR   Specific Gravity, Urine 1.022 1.005 - 1.030   pH 5.0 5.0 - 8.0   Glucose, UA >=500 (A) NEGATIVE mg/dL   Hgb urine dipstick LARGE (A) NEGATIVE   Bilirubin Urine NEGATIVE NEGATIVE   Ketones, ur 80 (A) NEGATIVE mg/dL   Protein, ur 100 (A) NEGATIVE mg/dL   Nitrite NEGATIVE NEGATIVE   Leukocytes, UA TRACE (A) NEGATIVE   RBC / HPF 6-30 0 - 5 RBC/hpf   WBC, UA 0-5 0 - 5 WBC/hpf   Bacteria, UA NONE SEEN NONE SEEN   Squamous Epithelial / LPF 6-30 (A) NONE SEEN   Mucus PRESENT   Pregnancy, urine     Status: None   Collection Time: 02/09/17  9:15 PM  Result Value Ref Range   Preg Test, Ur NEGATIVE NEGATIVE    Comment:        THE SENSITIVITY OF THIS METHODOLOGY IS >20 mIU/mL.   CBG monitoring, ED     Status: Abnormal   Collection Time: 02/09/17  9:38 PM  Result Value Ref Range   Glucose-Capillary 339 (H) 65 - 99 mg/dL  CBG monitoring, ED     Status: Abnormal   Collection Time: 02/09/17 11:02 PM  Result Value Ref Range   Glucose-Capillary 299 (H) 65 - 99 mg/dL   No results found.  Pending Labs Unresulted Labs (From admission, onward)   Start     Ordered   02/10/17 7616  Basic metabolic panel  Now then every 4 hours,   STAT     02/09/17 2227  02/09/17 2226  HIV antibody (Routine Testing)  Once,   R     02/09/17 2227      Vitals/Pain Today's Vitals   02/09/17 2153 02/09/17 2200 02/09/17  2230 02/09/17 2300  BP: 135/81 (!) 145/86 (!) 147/80 122/77  Pulse: (!) 108 (!) 109 (!) 109 (!) 110  Resp: '18  19 17  '$ Temp: 97.7 F (36.5 C)     TempSrc: Oral     SpO2: 100% 100% 100% 100%  Weight:      Height:      PainSc:        Isolation Precautions No active isolations  Medications Medications  insulin regular (NOVOLIN R,HUMULIN R) 100 Units in sodium chloride 0.9 % 100 mL (1 Units/mL) infusion (4.8 Units/hr Intravenous Rate/Dose Change 02/09/17 2304)  Influenza vac split quadrivalent PF (FLUARIX) injection 0.5 mL (not administered)  pneumococcal 23 valent vaccine (PNU-IMMUNE) injection 0.5 mL (not administered)  0.9 %  sodium chloride infusion (not administered)  0.9 %  sodium chloride infusion (not administered)  dextrose 5 %-0.45 % sodium chloride infusion (not administered)  enoxaparin (LOVENOX) injection 40 mg (not administered)  sodium chloride 0.9 % bolus 1,000 mL (1,000 mLs Intravenous New Bag/Given 02/09/17 2157)    Mobility ambulatory

## 2017-02-09 NOTE — ED Provider Notes (Signed)
[facility] DEPT Provider Note   CSN: [id number] Arrival date & time: 02/09/17  1315     History   Chief Complaint Chief Complaint  Patient presents with  . Abdominal Pain    HPI [name] is a 41 y.o. female.  Pt presents to the ED today with abdominal pain.  She was brought to the ED from a hotel.  She had multiple unopened containers of ETOH with her.  Pt was seen in the ED on 11/3 for the same.  She had a CT scan which showed a mass.  Pt was d/w obgyn who will see her as an outpatient.  Pt does have DM, but does not take any meds for her diabetes.  Pt does not have a pcp.  Pt denies any n/v.         Past Medical History:  Diagnosis Date  . Diabetes mellitus without complication ([place])   . GERD (gastroesophageal reflux disease)   . Hypercholesterolemia   . Hypertension   . Ventral hernia     Patient Active Problem List   Diagnosis Date Noted  . DKA (diabetic ketoacidoses) ([place]) 02/09/2017  . Hyponatremia 02/09/2017  . Hyperkalemia 02/09/2017  . Pelvic mass 02/09/2017    Past Surgical History:  Procedure Laterality Date  . CHOLECYSTECTOMY    . HAND SURGERY Right     OB History    No data available       Home Medications    Prior to Admission medications   Medication Sig Start Date End Date Taking? Authorizing Provider  ondansetron (ZOFRAN) 4 MG tablet Take 1 tablet (4 mg total) by mouth every 6 (six) hours. 02/07/17   Nat Christen, MD  oxyCODONE-acetaminophen (PERCOCET) 5-325 MG tablet Take 1 tablet by mouth every 4 (four) hours as needed. 02/07/17   Nat Christen, MD    Family History History reviewed. No pertinent family history.  Social History Social History   Tobacco Use  . Smoking status: Never Smoker  . Smokeless tobacco: Never Used  Substance Use Topics  . Alcohol use: No  . Drug use: No     Allergies   Patient has no known allergies.   Review of Systems Review of Systems    Gastrointestinal: Positive for abdominal distention.  All other systems reviewed and are negative.    Physical Exam Updated Vital Signs BP (!) 145/86   Pulse (!) 109   Temp 97.7 F (36.5 C) (Oral)   Resp 18   Ht 4\' 9"  (1.448 m)   Wt 47.6 kg (105 lb)   LMP 02/07/2017   SpO2 100%   BMI 22.72 kg/m   Physical Exam  Constitutional: She appears well-developed and well-nourished.  HENT:  Head: Normocephalic and atraumatic.  Mouth/Throat: Mucous membranes are dry.  Eyes: EOM are normal. Pupils are equal, round, and reactive to light.  Cardiovascular: Normal rate, regular rhythm and normal heart sounds.  Pulmonary/Chest: Effort normal and breath sounds normal.  Abdominal: Normal appearance. Bowel sounds are increased. A hernia is present. Hernia confirmed positive in the ventral area.  Musculoskeletal:  dactyly hands  Neurological: She is alert.  Skin: Skin is warm. Capillary refill takes less than 2 seconds.  Psychiatric: She has a normal mood and affect.  Nursing note and vitals reviewed.    ED Treatments / Results  Labs (all labs ordered are listed, but only abnormal results are displayed) Labs Reviewed  COMPREHENSIVE METABOLIC PANEL - Abnormal; Notable for the following components:  Result Value   Sodium 131 (*)    Potassium 5.2 (*)    CO2 9 (*)    Glucose, Bld 380 (*)    BUN 24 (*)    Total Protein 8.9 (*)    Albumin 3.4 (*)    AST 13 (*)    ALT 10 (*)    Anion gap 19 (*)    All other components within normal limits  CBC - Abnormal; Notable for the following components:   WBC 10.7 (*)    Hemoglobin 11.7 (*)    HCT 34.0 (*)    All other components within normal limits  URINALYSIS, ROUTINE W REFLEX MICROSCOPIC - Abnormal; Notable for the following components:   Glucose, UA >=500 (*)    Hgb urine dipstick LARGE (*)    Ketones, ur 80 (*)    Protein, ur 100 (*)    Leukocytes, UA TRACE (*)    Squamous Epithelial / LPF 6-30 (*)    All other components  within normal limits  BLOOD GAS, VENOUS - Abnormal; Notable for the following components:   pH, Ven 7.153 (*)    pCO2, Ven 19.4 (*)    Bicarbonate 6.6 (*)    Acid-base deficit 21.1 (*)    All other components within normal limits  CBG MONITORING, ED - Abnormal; Notable for the following components:   Glucose-Capillary 339 (*)    All other components within normal limits  LIPASE, BLOOD  ETHANOL  PREGNANCY, URINE  I-STAT BETA HCG BLOOD, ED ([facility], [facility], [facility] ONLY)    EKG  EKG Interpretation None       Radiology No results found.  Procedures Procedures (including critical care time)  Medications Ordered in ED Medications  insulin regular (NOVOLIN R,HUMULIN R) 100 Units in sodium chloride 0.9 % 100 mL (1 Units/mL) infusion (2.8 Units/hr Intravenous New Bag/Given 02/09/17 2157)  dextrose 5 %-0.45 % sodium chloride infusion (not administered)  Influenza vac split quadrivalent PF (FLUARIX) injection 0.5 mL (not administered)  pneumococcal 23 valent vaccine (PNU-IMMUNE) injection 0.5 mL (not administered)  sodium chloride 0.9 % bolus 1,000 mL (1,000 mLs Intravenous New Bag/Given 02/09/17 2157)     Initial Impression / Assessment and Plan / ED Course  I have reviewed the triage vital signs and the nursing notes.  Pertinent labs & imaging results that were available during my care of the patient were reviewed by me and considered in my medical decision making (see chart for details).  Pt is feeling better after some IVFs.  She was d/w Dr. Myna Hidalgo (triad) for admission.   CRITICAL CARE Performed by: Isla Pence   Total critical care time: 30 minutes  Critical care time was exclusive of separately billable procedures and treating other patients.  Critical care was necessary to treat or prevent imminent or life-threatening deterioration.  Critical care was time spent personally by me on the following activities: development of treatment plan with patient and/or surrogate as well as  nursing, discussions with consultants, evaluation of patient's response to treatment, examination of patient, obtaining history from patient or surrogate, ordering and performing treatments and interventions, ordering and review of laboratory studies, ordering and review of radiographic studies, pulse oximetry and re-evaluation of patient's condition.  Final Clinical Impressions(s) / ED Diagnoses   Final diagnoses:  Diabetic ketoacidosis without coma associated with type 2 diabetes mellitus ([place])  Noncompliance with medication regimen  H/O pelvic mass  Ventral hernia without obstruction or gangrene    ED Discharge Orders  None       Isla Pence, MD 02/09/17 2225

## 2017-02-09 NOTE — Telephone Encounter (Signed)
Left message on the machine for the patient to call the office. Patient needs new patient appt.

## 2017-02-09 NOTE — ED Triage Notes (Addendum)
Patient was picked up from a hotel and transported via [facility]. Patient is experiencing abd pain. She was seen for the same symptoms at [facility] over the weekend. Patient has a known ventral hernia. When EMS picked her up, she had an empty, open, container by her side. EMS brought closed containers of ETOH and it is located at nurses station.

## 2017-02-10 ENCOUNTER — Other Ambulatory Visit: Payer: Self-pay

## 2017-02-10 ENCOUNTER — Telehealth: Payer: Self-pay | Admitting: *Deleted

## 2017-02-10 DIAGNOSIS — R229 Localized swelling, mass and lump, unspecified: Secondary | ICD-10-CM

## 2017-02-10 DIAGNOSIS — Z87898 Personal history of other specified conditions: Secondary | ICD-10-CM

## 2017-02-10 DIAGNOSIS — K436 Other and unspecified ventral hernia with obstruction, without gangrene: Secondary | ICD-10-CM

## 2017-02-10 DIAGNOSIS — R109 Unspecified abdominal pain: Secondary | ICD-10-CM

## 2017-02-10 DIAGNOSIS — K429 Umbilical hernia without obstruction or gangrene: Secondary | ICD-10-CM

## 2017-02-10 DIAGNOSIS — E43 Unspecified severe protein-calorie malnutrition: Secondary | ICD-10-CM

## 2017-02-10 DIAGNOSIS — C569 Malignant neoplasm of unspecified ovary: Secondary | ICD-10-CM

## 2017-02-10 DIAGNOSIS — E1165 Type 2 diabetes mellitus with hyperglycemia: Secondary | ICD-10-CM | POA: Insufficient documentation

## 2017-02-10 DIAGNOSIS — E1110 Type 2 diabetes mellitus with ketoacidosis without coma: Principal | ICD-10-CM

## 2017-02-10 LAB — BASIC METABOLIC PANEL
ANION GAP: 17 — AB (ref 5–15)
Anion gap: 10 (ref 5–15)
Anion gap: 10 (ref 5–15)
Anion gap: 8 (ref 5–15)
BUN: 20 mg/dL (ref 6–20)
BUN: 23 mg/dL — AB (ref 6–20)
BUN: 24 mg/dL — AB (ref 6–20)
BUN: 27 mg/dL — AB (ref 6–20)
CALCIUM: 8.1 mg/dL — AB (ref 8.9–10.3)
CHLORIDE: 109 mmol/L (ref 101–111)
CHLORIDE: 110 mmol/L (ref 101–111)
CHLORIDE: 111 mmol/L (ref 101–111)
CO2: 14 mmol/L — ABNORMAL LOW (ref 22–32)
CO2: 14 mmol/L — ABNORMAL LOW (ref 22–32)
CO2: 16 mmol/L — AB (ref 22–32)
CO2: 9 mmol/L — ABNORMAL LOW (ref 22–32)
CREATININE: 0.47 mg/dL (ref 0.44–1.00)
CREATININE: 0.45 mg/dL (ref 0.44–1.00)
Calcium: 8.4 mg/dL — ABNORMAL LOW (ref 8.9–10.3)
Calcium: 8.4 mg/dL — ABNORMAL LOW (ref 8.9–10.3)
Calcium: 8.9 mg/dL (ref 8.9–10.3)
Chloride: 106 mmol/L (ref 101–111)
Creatinine, Ser: 0.51 mg/dL (ref 0.44–1.00)
Creatinine, Ser: 0.78 mg/dL (ref 0.44–1.00)
GFR calc Af Amer: >60 mL/min (ref >60)
GFR calc Af Amer: >60 mL/min (ref >60)
Glucose, Bld: 142 mg/dL — ABNORMAL HIGH (ref 65–99)
Glucose, Bld: 178 mg/dL — ABNORMAL HIGH (ref 65–99)
Glucose, Bld: 229 mg/dL — ABNORMAL HIGH (ref 65–99)
Glucose, Bld: 241 mg/dL — ABNORMAL HIGH (ref 65–99)
POTASSIUM: 3.9 mmol/L (ref 3.5–5.1)
POTASSIUM: 4 mmol/L (ref 3.5–5.1)
POTASSIUM: 4.4 mmol/L (ref 3.5–5.1)
Potassium: 3.6 mmol/L (ref 3.5–5.1)
SODIUM: 130 mmol/L — AB (ref 135–145)
SODIUM: 133 mmol/L — AB (ref 135–145)
SODIUM: 134 mmol/L — AB (ref 135–145)
SODIUM: 137 mmol/L (ref 135–145)

## 2017-02-10 LAB — GLUCOSE, CAPILLARY
GLUCOSE-CAPILLARY: 118 mg/dL — AB (ref 65–99)
GLUCOSE-CAPILLARY: 126 mg/dL — AB (ref 65–99)
GLUCOSE-CAPILLARY: 143 mg/dL — AB (ref 65–99)
GLUCOSE-CAPILLARY: 146 mg/dL — AB (ref 65–99)
GLUCOSE-CAPILLARY: 150 mg/dL — AB (ref 65–99)
GLUCOSE-CAPILLARY: 192 mg/dL — AB (ref 65–99)
GLUCOSE-CAPILLARY: 207 mg/dL — AB (ref 65–99)
GLUCOSE-CAPILLARY: 325 mg/dL — AB (ref 65–99)
GLUCOSE-CAPILLARY: 329 mg/dL — AB (ref 65–99)
Glucose-Capillary: 179 mg/dL — ABNORMAL HIGH (ref 65–99)
Glucose-Capillary: 173 mg/dL — ABNORMAL HIGH (ref 65–99)
Glucose-Capillary: 163 mg/dL — ABNORMAL HIGH (ref 65–99)
Glucose-Capillary: 179 mg/dL — ABNORMAL HIGH (ref 65–99)
Glucose-Capillary: 215 mg/dL — ABNORMAL HIGH (ref 65–99)
Glucose-Capillary: 188 mg/dL — ABNORMAL HIGH (ref 65–99)

## 2017-02-10 LAB — HEMOGLOBIN A1C
HEMOGLOBIN A1C: 13.7 % — AB (ref 4.8–5.6)
MEAN PLASMA GLUCOSE: 346.49 mg/dL

## 2017-02-10 LAB — HIV ANTIBODY (ROUTINE TESTING W REFLEX): HIV SCREEN 4TH GENERATION: NONREACTIVE

## 2017-02-10 LAB — HCG, SERUM, QUALITATIVE: PREG SERUM: NEGATIVE

## 2017-02-10 LAB — MRSA PCR SCREENING: MRSA BY PCR: NEGATIVE

## 2017-02-10 MED ORDER — ENSURE ENLIVE PO LIQD: 237.0000 mL | Freq: Three times a day (TID) | ORAL | Status: DC

## 2017-02-10 MED ORDER — INSULIN GLARGINE 100 UNIT/ML ~~LOC~~ SOLN: 10.0000 [IU] | Freq: Every day | SUBCUTANEOUS | 11 refills | Status: DC

## 2017-02-10 MED ORDER — ENOXAPARIN SODIUM 30 MG/0.3ML ~~LOC~~ SOLN: 30.0000 mg | Freq: Every day | SUBCUTANEOUS | Status: DC

## 2017-02-10 MED ORDER — ADULT MULTIVITAMIN W/MINERALS CH
1.0000 | ORAL_TABLET | Freq: Every day | ORAL | Status: DC
  Administered 2017-02-10: 1 via ORAL
  Filled 2017-02-10: qty 1

## 2017-02-10 MED ORDER — INSULIN GLARGINE 100 UNIT/ML ~~LOC~~ SOLN
10.0000 [IU] | Freq: Every day | SUBCUTANEOUS | Status: DC
  Administered 2017-02-10: 10 [IU] via SUBCUTANEOUS
  Filled 2017-02-10: qty 0.1

## 2017-02-10 MED ORDER — INSULIN ASPART 100 UNIT/ML ~~LOC~~ SOLN
0.0000 [IU] | Freq: Three times a day (TID) | SUBCUTANEOUS | Status: DC
  Administered 2017-02-10: 7 [IU] via SUBCUTANEOUS
  Administered 2017-02-10: 2 [IU] via SUBCUTANEOUS

## 2017-02-10 NOTE — Progress Notes (Signed)
Spoke to GYN surgery Dr Nancy Marus, she recommends patient to be transferred to [facility] for surgery of pelvic mass.  Patient will be transferred to [facility] tomorrow under the service of Dr. Alycia Rossetti.

## 2017-02-10 NOTE — Consult Note (Signed)
Consult Note: Gyn-Onc  [name] 41 y.o. female  CC:  Chief Complaint  Patient presents with  . Abdominal Pain    HPI: Patient is seen today in consultation at the request of Dr. Darrick Meigs with the hospitalist service and after discussion with Dr. Neysa Bonito.  [name] is a 41 y.o.G0 with medical history significant for diabetes mellitus and ventral hernia, presenting to the emergency department for evaluation of abdominal pain.  Patient reports constant moderate burning abdominal pain for the past 3-4 days associated with nausea and nonbloody vomiting, but no diarrhea or fevers.  She was evaluated for these complaints in the emergency department 3 days ago, noted to have a large pelvic mass on CT, and outpatient follow-up was arranged with gynecologic surgeon.  Patient was prescribed oxycodone and Zofran and discharged from the emergency department, but continues to experience these symptoms which have worsened slightly since leaving the ED 3 days ago. Patient is homeless, does not have a PCP, and does not have any medications for her diabetes. She was admitted on 02/09/17.  ED Course:  Patient was treated with a liter of normal saline and started on weekly stable, not in any apparent respiratory distress, and will be admitted to the stepdown unit for ongoing evaluation and management of diabetic ketoacidosis in a diabetic not taking any medications for this. HgA1c 13.7.  CT 02/07/17:  Stomach/Bowel: No bowel obstruction. No wall thickening or inflammatory change. Colon and small bowel is displaced by a large mass in the central peritoneal cavity extending from the pelvis. Appendix not visualized.  Vascular/Lymphatic: Vessels are unremarkable. No discrete pathologically enlarged lymph nodes.  Reproductive: There is a large heterogeneous solid mass that extends from the right pelvis into the abdomen occupying much of the peritoneal cavity. Mass measures 21 x 12 x 19 cm. There  is a smaller mass with a significant cystic component that extends from the left upper pelvis along the left retroperitoneum. It measures 10 x 7 x 8 cm. The uterus is deviated to the left. The uterine fundus is contiguous with the larger mass. There is a small cystic component along the right inferolateral margin of the larger mass with a small amount of adjacent fluid.  Other: There is a paraumbilical hernia containing fat with increased attenuation and multiple prominent vessels. The hernia measures 8 x 4 x 7 cm.  Musculoskeletal: No acute or significant osseous findings.  IMPRESSION: 1. Large masses as detailed, which are likely gynecologic in origin.The larger mass, which occupies much of the peritoneal cavity, extends from the right pelvis, measuring 21 cm in greatest dimension. It is contiguous with the uterine fundus. A massive exophytic fibroid is possible. Malignant degeneration should be considered given the size. Alternatively, this may reflect a solid ovarian mass. The smaller mass extends from the pelvis to the left lower quadrant retroperitoneum, with a significant cystic component. It measures 8 cm in greatest dimension. Ovarian origin suspected. 2. 8 cm paraumbilical hernia. Fat within the hernia has increased attenuation consistent with congestion. The hernia lies anterior to the large peritoneal mass. 3. Status post cholecystectomy. No other abnormalities.  Problem List:  Principal Problem:   DKA (diabetic ketoacidoses) ([place]) Active Problems:   Hyponatremia   Hyperkalemia   Pelvic mass   Homelessness   Incarcerated ventral hernia   DM (diabetes mellitus) type 2, uncontrolled, with ketoacidosis ([place])   Protein-calorie malnutrition, severe ([place])  Review of Systems: Patient c/o abdominal pain. LMP now.  Current Meds:  Current Facility-Administered  Medications  Medication Dose Route Frequency Provider Last Rate Last Dose  . lip balm (CARMEX) ointment           .  dextrose 5 %-0.45 % sodium chloride infusion   Intravenous Continuous Opyd, Ilene Qua, MD 100 mL/hr at 02/10/17 1000    . enoxaparin (LOVENOX) injection 30 mg  30 mg Subcutaneous QHS [place], Marge Duncans, MD      . feeding supplement (ENSURE ENLIVE) (ENSURE ENLIVE) liquid 237 mL  237 mL Oral BID BM Opyd, Timothy S, MD      . insulin aspart (novoLOG) injection 0-9 Units  0-9 Units Subcutaneous TID WC Darrick Meigs, Marge Duncans, MD      . insulin glargine (LANTUS) injection 10 Units             Allergy: No Known Allergies  Social Hx:   Social History   Socioeconomic History  . Marital status: Single    Spouse name: Not on file  . Number of children: Not on file  . Years of education: Not on file  . Highest education level: Not on file  Social Needs  . Financial resource strain: Not on file  . Food insecurity - worry: Not on file  . Food insecurity - inability: Not on file  . Transportation needs - medical: Not on file  . Transportation needs - non-medical: Not on file  Occupational History  . Not on file  Tobacco Use  . Smoking status: Never Smoker  . Smokeless tobacco: Never Used  Substance and Sexual Activity  . Alcohol use: No  . Drug use: No  . Sexual activity: Not on file  Other Topics Concern  . Not on file  Social History Narrative  . Not on file    Past Surgical Hx:  Past Surgical History:  Procedure Laterality Date  . CHOLECYSTECTOMY    . HAND SURGERY Right     Past Medical Hx:  Past Medical History:  Diagnosis Date  . Diabetes mellitus without complication ([place])   . GERD (gastroesophageal reflux disease)   . Hypercholesterolemia   . Hypertension   . Ventral hernia     Oncology Hx:   No history exists.    Family Hx: History reviewed. No pertinent family history.  Vitals:  Blood pressure (!) 157/76, pulse (!) 103, temperature 98.9 F (37.2 C), temperature source Oral, resp. rate 14, height 4\' 9"  (1.448 m), weight 78 lb 14.8 oz (35.8 kg), last menstrual period  02/07/2017, SpO2 100 %.  Physical Exam: Thin. Cachectic female in NAD.  Abdomen: Large abdominal mass to xiphoid towards left. Reducible 8 cm umbilical hernia.  Assessment/Plan: 41 year old with large abdominal pelvic mass, DM with A1c >13 who presented with DKA. She needs to undergo definitive surgery for this mass and we need to transfer her to [facility] to expedite her surgery. I have spoken to Dr. Darrick Meigs and will accept her to my service for surgery on 02/13/17.  This was all explained to the patient. She is very pleased that she have surgery as she has had this mass for 9 years per her report and it is painful, she is in no acute distress and in sitting in bed eating.   We appreciate the opportunity to participate in the care of this patient.  Carleigh Buccieri A., MD 02/10/2017, 2:26 PM

## 2017-02-10 NOTE — Progress Notes (Signed)
Inpatient Diabetes Program Recommendations  AACE/ADA: New Consensus Statement on Inpatient Glycemic Control (2015)  Target Ranges:  Prepandial:   less than 140 mg/dL      Peak postprandial:   less than 180 mg/dL (1-2 hours)      Critically ill patients:  140 - 180 mg/dL   Lab Results  Component Value Date   GLUCAP 179 (H) 02/10/2017   HGBA1C (H) 01/16/2007    11.7 (NOTE)   The ADA recommends the following therapeutic goals for glycemic   control related to Hgb A1C measurement:   Goal of Therapy:   < 7.0% Hgb A1C   Action Suggested:  > 8.0% Hgb A1C   Ref:  Diabetes Care, 22, Suppl. 1, 1999    Review of Glycemic Control  Diabetes history: DM2 Outpatient Diabetes medications: None Current orders for Inpatient glycemic control: Transitioning off insulin drip. Lantus 10 units QAM, Novolog 0-9 units tidwc. CHO mod med diet ordered. HgbA1C - 11.7% - uncontrolled Note: In 2016 per EMR, pt was on glyburide 5 mg QD, metformin 850 mg bid, Actos 15 mg bid. States her MD went out of business.  Pt is homeless. Will need affordable insulin at discharge. Needs PCP to manage her DM. Case manager consult and social work consult needed.  Pt very sleepy right now. Will speak with her after lunch.   Continue to follow.   Thank you. Lorenda Peck, RD, LDN, CDE Inpatient Diabetes Coordinator [phone]

## 2017-02-10 NOTE — Discharge Summary (Signed)
Physician Discharge Summary  Zoeya Gramajo ZDG:[id number] DOB: April 26, 1975 DOA: 02/09/2017  PCP: Patient, No Pcp Per  Admit date: 02/09/2017 Discharge date: 02/10/2017  Time spent: *35 minutes  Recommendations for Outpatient Follow-up:  1. Patient to be transferred to [facility] for surgery for pelvic mass 2.    Discharge Diagnoses:  Principal Problem:   Giant ovarian mass Active Problems:   DKA (diabetic ketoacidoses) (HCC)   Hyponatremia   Hyperkalemia   Homelessness   Incarcerated ventral hernia   DM (diabetes mellitus) type 2, uncontrolled, with ketoacidosis ([place])   Protein-calorie malnutrition, severe ([place])   H/O pelvic mass   Discharge Condition: Stable  Diet recommendation: Modified diet  Filed Weights   02/09/17 2148 02/09/17 2337  Weight: 47.6 kg (105 lb) 35.8 kg (78 lb 14.8 oz)    History of present illness:  41 y.o.femalewith medical history significant fordiabetes mellitus and ventral hernia, presenting to the emergency department for evaluation of abdominal pain. Patient reports constant moderate burning abdominal pain for the past 3-4 days associated with nausea and nonbloody vomiting, but no diarrhea or fevers. She was evaluated for these complaints in the emergency department 2 days ago, noted to have a large pelvic mass on CT, and outpatient follow-up was arranged with gynecologic surgeon. Patient was prescribed oxycodone and Zofran and discharged from the emergency department, but continues to experience these symptoms which have worsened slightly since leaving the ED 2 days ago. Patient is homeless, does not have a PCP, and does not have any medications for her diabetes.    Hospital Course:   1. Diabetic ketoacidosis-resolved, will discontinue IV insulin.  Start Lantus 10 units subcu daily, sliding scale insulin with NovoLog.  Anion gap is 10.  CO2 still low likely from hyperchloremic acidosis.   2. Uncontrolled diabetes mellitus-patient's  hemoglobin A1c is 11.7.  We will consult diabetes coordinator.  Patient might require insulin at home.  Patient is homeless so it can be barriers for getting medications as outpatient.  Will consult case management and social work.  Continue Lantus 10 units subcu daily.  Start carbohydrate modified diet. 3. Pelvic mass-patient had a large pelvic mass noted on CT on 02/07/2017.  Likely fibroid.  Patient  be seen by GYN oncology Dr.Paola Alycia Rossetti, who recommended transfer to [facility].  Patient will be transferred to [facility]. 1. Paraumbilical hernia-CT scan done on 02/07/2017 showed 8 cm periumbilical hernia.  Fat within the hernia has increased attenuation consistent with congestion.  Hernia lies anterior to the large peritoneal mass.  Patient was seen by general surgery and they recommended that hernia to be addressed at the time of removal of giant pelvic tumor.   Procedures: None Consultations:  GYN oncology  General surgery  Discharge Exam: Vitals:   02/10/17 1520 02/10/17 1750  BP: (!) 145/77 127/84  Pulse: (!) 102 (!) 103  Resp: 18 18  Temp:  98.4 F (36.9 C)  SpO2: 100% 100%      Discharge Instructions   Discharge Instructions    Diet - low sodium heart healthy   Complete by:  As directed    Increase activity slowly   Complete by:  As directed      Current Discharge Medication List    START taking these medications   Details  insulin glargine (LANTUS) 100 UNIT/ML injection Inject 0.1 mLs (10 Units total) daily into the skin. Qty: 10 mL, Refills: 11      CONTINUE these medications which have NOT CHANGED  Details  ondansetron (ZOFRAN) 4 MG tablet Take 1 tablet (4 mg total) by mouth every 6 (six) hours. Qty: 10 tablet, Refills: 0      STOP taking these medications     oxyCODONE-acetaminophen (PERCOCET) 5-325 MG tablet        No Known Allergies    The results of significant diagnostics from this hospitalization (including imaging,  microbiology, ancillary and laboratory) are listed below for reference.    Significant Diagnostic Studies: US Pelvis Complete  Result Date: 02/07/2017 CLINICAL DATA:  Abdominal and pelvic pain. Abnormal CT scan from earlier today. EXAM: TRANSABDOMINAL ULTRASOUND OF PELVIS TECHNIQUE: Transabdominal ultrasound examination of the pelvis was performed including evaluation of the uterus, ovaries, adnexal regions, and pelvic cul-de-sac. COMPARISON:  CT scan from earlier today FINDINGS: Uterus Measurements: 12.3 x 4.4 x 6.0 cm. Two pelvic masses are identified. The first is described in the right pelvis measuring 19 x 11 x 15 cm. This likely correlates with the mass seen on CT imaging, thought to be contiguous with the uterine fundus on CT imaging. The second was seen to the left measuring 9 x 7 x 8 cm. Both masses appear solid. No separate ovaries are seen. The cystic mass in the left lower abdomen/ upper pelvis on the CT scan was not visualized on this study. Endometrium Thickness: 5 mm.  Difficult to assess due to the large masses. Right ovary There is a mass in the right side of the pelvis. Whether this arises from the ovary or uterus is unclear on this ultrasound. This was thought to most likely be uterine in origin on the CT from earlier today. No separate ovary identified. Left ovary There is a mass in the left side of the pelvis. Whether this arises from the ovary or uterus is unclear on this ultrasound. The cystic mass seen in the left side of the pelvis/lower abdomen on today's CT scan is not visualized on this study. Other findings:  Trace fluid, likely physiologic. IMPRESSION: 1. Two masses are seen in the pelvis, 1 to the right measuring up to 19 cm and 1 to the left measuring up to 9 cm. On this study, it is unclear whether the masses are adnexal/ovarian versus uterine in origin. The largest mass on the right was thought to most likely arise from the uterus on the recent CT scan raising the possibility  of a very large exophytic fibroid. Given the very large size, malignant degeneration of a fibroid is not excluded. The complex cystic mass in the lower abdomen/upper pelvis to the left is not seen on this study. Recommended MRI of the pelvis to better assess the uterus, the masses seen on both the CT and ultrasound, the adnexae, and the complex cystic mass seen to the left on today's CT scan. Recommend gynecologic consultation. Electronically Signed   By: Dorise Bullion III M.D   On: 02/07/2017 17:48   Ct Abdomen Pelvis W Contrast  Result Date: 02/07/2017 CLINICAL DATA:  Patient here with complaints of abdominal pain. Also states that she has acid reflux. Nausea/vomiting. Diabetic. ^123mL ISOVUE-300 IOPAMIDOL (ISOVUE-300) INJECTION 61% EXAM: CT ABDOMEN AND PELVIS WITH CONTRAST TECHNIQUE: Multidetector CT imaging of the abdomen and pelvis was performed using the standard protocol following bolus administration of intravenous contrast. CONTRAST:  184mL ISOVUE-300 IOPAMIDOL (ISOVUE-300) INJECTION 61% COMPARISON:  Ultrasound, 04/08/2007. FINDINGS: Lower chest: Clear lung bases.  Heart normal in size. Hepatobiliary: No focal liver abnormality is seen. Status post cholecystectomy. No biliary dilatation. Pancreas: Unremarkable. No pancreatic  ductal dilatation or surrounding inflammatory changes. Spleen: Normal in size without focal abnormality. Adrenals/Urinary Tract: Adrenal glands are unremarkable. Kidneys are normal, without renal calculi, focal lesion, or hydronephrosis. Bladder is unremarkable. Stomach/Bowel: No bowel obstruction. No wall thickening or inflammatory change. Colon and small bowel is displaced by a large mass in the central peritoneal cavity extending from the pelvis. Appendix not visualized. Vascular/Lymphatic: Vessels are unremarkable. No discrete pathologically enlarged lymph nodes. Reproductive: There is a large heterogeneous solid mass that extends from the right pelvis into the abdomen  occupying much of the peritoneal cavity. Mass measures 21 x 12 x 19 cm. There is a smaller mass with a significant cystic component that extends from the left upper pelvis along the left retroperitoneum. It measures 10 x 7 x 8 cm. The uterus is deviated to the left. The uterine fundus is contiguous with the larger mass. There is a small cystic component along the right inferolateral margin of the larger mass with a small amount of adjacent fluid. Other: There is a paraumbilical hernia containing fat with increased attenuation and multiple prominent vessels. The hernia measures 8 x 4 x 7 cm. Musculoskeletal: No acute or significant osseous findings. IMPRESSION: 1. Large masses as detailed, which are likely gynecologic in origin. The larger mass, which occupies much of the peritoneal cavity, extends from the right pelvis, measuring 21 cm in greatest dimension. It is contiguous with the uterine fundus. A massive exophytic fibroid is possible. Malignant degeneration should be considered given the size. Alternatively, this may reflect a solid ovarian mass. The smaller mass extends from the pelvis to the left lower quadrant retroperitoneum, with a significant cystic component. It measures 8 cm in greatest dimension. Ovarian origin suspected. 2. 8 cm paraumbilical hernia. Fat within the hernia has increased attenuation consistent with congestion. The hernia lies anterior to the large peritoneal mass. 3. Status post cholecystectomy. No other abnormalities. Electronically Signed   By: Lajean Manes M.D.   On: 02/07/2017 15:54    Microbiology: Recent Results (from the past 240 hour(s))  MRSA PCR Screening     Status: None   Collection Time: 02/10/17 12:01 AM  Result Value Ref Range Status   MRSA by PCR NEGATIVE NEGATIVE Final    Comment:        The GeneXpert MRSA Assay (FDA approved for NASAL specimens only), is one component of a comprehensive MRSA colonization surveillance program. It is not intended to  diagnose MRSA infection nor to guide or monitor treatment for MRSA infections.      Labs: Basic Metabolic Panel: Recent Labs  Lab 02/09/17 1356 02/10/17 0001 02/10/17 0319 02/10/17 0756 02/10/17 1207  NA 131* 137 134* 133* 130*  K 5.2* 4.4 4.0 3.9 3.6  CL 103 111 110 109 106  CO2 9* 9* 14* 14* 16*  GLUCOSE 380* 241* 178* 142* 229*  BUN 24* 27* 24* 23* 20  CREATININE 0.77 0.78 0.51 0.47 0.45  CALCIUM 9.2 8.9 8.4* 8.4* 8.1*   Liver Function Tests: Recent Labs  Lab 02/07/17 1351 02/09/17 1356  AST 20 13*  ALT 13* 10*  ALKPHOS 60 76  BILITOT 1.4* 1.2  PROT 8.2* 8.9*  ALBUMIN 3.4* 3.4*   Recent Labs  Lab 02/07/17 1351 02/09/17 1356  LIPASE 19 21   No results for input(s): AMMONIA in the last 168 hours. CBC: Recent Labs  Lab 02/07/17 1321 02/09/17 1356  WBC 11.1* 10.7*  NEUTROABS 9.8*  --   HGB 11.3* 11.7*  HCT 33.7* 34.0*  MCV 83.2 85.0  PLT PLATELET CLUMPS NOTED ON SMEAR, COUNT APPEARS ADEQUATE 367   Cardiac Enzymes: No results for input(s): CKTOTAL, CKMB, CKMBINDEX, TROPONINI in the last 168 hours. BNP: BNP (last 3 results) No results for input(s): BNP in the last 8760 hours.  ProBNP (last 3 results) No results for input(s): PROBNP in the last 8760 hours.  CBG: Recent Labs  Lab 02/10/17 1015 02/10/17 1144 02/10/17 1244 02/10/17 1633 02/10/17 1711  GLUCAP 179* 215* 188* 329* 325*       Signed:  Eleonore Chiquito S MD.  [facility] 02/10/2017, 6:40 PM

## 2017-02-10 NOTE — Progress Notes (Signed)
Initial Nutrition Assessment  DOCUMENTATION CODES:   Severe malnutrition in context of chronic illness, Underweight  INTERVENTION:   -Continue Ensure Enlive po TID, each supplement provides 350 kcal and 20 grams of protein -Provide Multivitamin with minerals daily -Encouraged PO intake  NUTRITION DIAGNOSIS:   Severe Malnutrition related to social / environmental circumstances(homelessness, food insecurity) as evidenced by energy intake < or equal to 50% for > or equal to 1 month, severe fat depletion, severe muscle depletion.  GOAL:   Patient will meet greater than or equal to 90% of their needs  MONITOR:   PO intake, Supplement acceptance, Labs, Weight trends, I & O's  REASON FOR ASSESSMENT:   Malnutrition Screening Tool    ASSESSMENT:   41 y.o. female with medical history significant for diabetes mellitus and ventral hernia, presenting to the emergency department for evaluation of abdominal pain.  Patient reports constant moderate burning abdominal pain for the past 3-4 days associated with nausea and nonbloody vomiting, but no diarrhea or fevers.  She was evaluated for these complaints in the emergency department 2 days ago, noted to have a large pelvic mass on CT, and outpatient follow-up was arranged with gynecologic surgeon.  Patient was prescribed oxycodone and Zofran and discharged from the emergency department, but continues to experience these symptoms which have worsened slightly since leaving the ED 2 days ago. Patient is homeless, does not have a PCP, and does not have any medications for her diabetes.    Patient in room with lunch tray at bedside. A few bites were taken of meatloaf but patient states she will finish the tray later. States she is sleepy. Pt states PTA, she was living with someone until Saturday. Chart review reports pt is homeless. Pt states she was eating "anything", examples include pizza, baked chicken, meatloaf, mashed potatoes and cake.  Pt states  she did not eat anything for breakfast. Ensure open on tray but pt reports not drinking any Ensure. Reported intakes questionable.   Pt denies issues with chewing but states sometimes she has difficulty swallowing. "Foods will get stuck and won't go down".  Pt requests a drink during visit. RD provided diet sprite.  Pt noted to have a left hand deformity but was able to open can of soda with no problem.  Per patient, UBW is ~90 lb. Could not state an exact weight.   Medications: Multivitamin with minerals daily, D5 -.45% Nacl infusion at 100 ml/hr -provides 408 kcal Labs reviewed: CBGs: 188-215 Low Na   NUTRITION - FOCUSED PHYSICAL EXAM:    Most Recent Value  Orbital Region  Moderate depletion  Upper Arm Region  Severe depletion  Thoracic and Lumbar Region  Unable to assess  Buccal Region  Mild depletion  Temple Region  Moderate depletion  Clavicle Bone Region  Moderate depletion  Clavicle and Acromion Bone Region  Moderate depletion  Scapular Bone Region  Severe depletion  Dorsal Hand  Severe depletion  Patellar Region  Unable to assess  Anterior Thigh Region  Unable to assess  Posterior Calf Region  Unable to assess  Edema (RD Assessment)  None  Skin  Reviewed  Nails  Reviewed       Diet Order:  Diet Carb Modified Fluid consistency: Thin; Room service appropriate? Yes  EDUCATION NEEDS:   No education needs have been identified at this time  Skin:  Skin Assessment: Reviewed RN Assessment  Last BM:  PTA  Height:   Ht Readings from Last 1 Encounters:  02/09/17 4'  9" (1.448 m)    Weight:   Wt Readings from Last 1 Encounters:  02/09/17 78 lb 14.8 oz (35.8 kg)    Ideal Body Weight:  43.2 kg  BMI:  Body mass index is 17.08 kg/m.  Estimated Nutritional Needs:   Kcal:  1250-1450  Protein:  65-75g  Fluid:  1.5L/day   Clayton Bibles, MS, RD, LDN [place] [facility] Pager: [phone] After Hours Pager: [phone]

## 2017-02-10 NOTE — Consult Note (Addendum)
[facility]  [address] [place], [place] [address] Phone: [phone] FAX: [place]  1975/12/21 [id number]  CARE TEAM:  PCP: Patient, No Pcp Per  Outpatient Care Team: Patient Care Team: Patient, No Pcp Per as PCP - General (General Practice)  Inpatient Treatment Team: Treatment Team: Attending Provider: Oswald Hillock, MD; Registered Nurse: Hoyle Barr, RN; Rounding Team: Joycelyn Das, MD; Registered Nurse: Heriberto Antigua, RN; Case Manager: Frann Rider, RN; Rounding Team: Nolon Nations, MD; Consulting Physician: Everitt Amber, MD   This patient is a 41 y.o.female who presents today for surgical evaluation at the request of Dr Darrick Meigs.   Chief complaint / Reason for evaluation: Incarcerated ventral hernia.  Giant pelvic mass.  41 year old homeless female.  Worsening abdominal pain.  History of diabetes uncontrolled.  Came in with glucose over 400 suspected for diabetic ketoacidosis.  Admitted with aggressive glucose control.  Abdominal pain discomfort.  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  Apparently they contacted gynecology.  They recommended outpatient follow-up.  Patient also noted to have omentum within a periumbilical ventral hernia.  Consultation made for ventral hernia.  Patient said some abdominal pain and failure to thrive.  She is not been compliant on any of her medications due to her homelessness and inability to pay.  No prior abdominal surgeries.   Assessment  [name]  41 y.o. female       Problem List:  Principal Problem:   DKA (diabetic ketoacidoses) ([place]) Active Problems:   Hyponatremia   Hyperkalemia   Pelvic mass   Homelessness   Incarcerated ventral hernia   DM (diabetes mellitus) type 2, uncontrolled, with ketoacidosis ([place])   Protein-calorie malnutrition, severe  ([place])   Periumbilical ventral hernia incarcerated with omentum within it.  No evidence of any phlegmon infection or obstruction.  Plan:  While she may benefit from surgery at some point to fix her hernia, the giant pelvic tumor is by far the more pressing issue.  Consider gynecological consultation inpatient given her homelessness status so they can properly work her up.  Most likely would benefit from resection.  The hernia can be addressed at that time.  Probably start with primary repair and then consider mesh reinforcement if that fails.  The tumor is so giant it is probably acted as a tissue expander which would allow a tension-free primary repair if needed.  This point I will follow as needed since she does not have any urgent surgical issues at this time  She seems rather cachectic.  Suspect this tumor is a malignancy.  Reconsider gynecological oncology consultation now.  I am skeptical that she will have decent outpatient follow-up given her homelessness and financial hardship.  I was able to reach Dr Ardis Hughs.  She discussed with her partner, Dr. Denman George.  They agree she would benefit from resection of the giant pelvic masses.  Try to do this admission if possible.  Patient may be transferred to [facility] depending on operative availability.  Otherwise will be lost to follow-up.  Try supplemental nutrition.  Control diabetes.  -VTE prophylaxis- SCDs, etc -mobilize as tolerated to help recovery  30 minutes spent in review, evaluation, examination, counseling, and coordination of care.  More than 50% of that time was spent in counseling.  Adin Hector, M.D., F.A.C.S. Gastrointestinal and Minimally Invasive Surgery [facility] [address], [place], [place] [address] [phone] Main / Paging   02/10/2017      Past Medical History:  Diagnosis Date  . Diabetes mellitus without complication ([place])   . GERD (gastroesophageal reflux  disease)   . Hypercholesterolemia   . Hypertension   . Ventral hernia     Past Surgical History:  Procedure Laterality Date  . CHOLECYSTECTOMY    . HAND SURGERY Right     Social History   Socioeconomic History  . Marital status: Single    Spouse name: Not on file  . Number of children: Not on file  . Years of education: Not on file  . Highest education level: Not on file  Social Needs  . Financial resource strain: Not on file  . Food insecurity - worry: Not on file  . Food insecurity - inability: Not on file  . Transportation needs - medical: Not on file  . Transportation needs - non-medical: Not on file  Occupational History  . Not on file  Tobacco Use  . Smoking status: Never Smoker  . Smokeless tobacco: Never Used  Substance and Sexual Activity  . Alcohol use: No  . Drug use: No  . Sexual activity: Not on file  Other Topics Concern  . Not on file  Social History Narrative  . Not on file    History reviewed. No pertinent family history.  Current Facility-Administered Medications  Medication Dose Route Frequency Provider Last Rate Last Dose  . lip balm (CARMEX) ointment           . dextrose 5 %-0.45 % sodium chloride infusion   Intravenous Continuous Opyd, Ilene Qua, MD 100 mL/hr at 02/10/17 1000    . enoxaparin (LOVENOX) injection 30 mg  30 mg Subcutaneous QHS [place], Marge Duncans, MD      . feeding supplement (ENSURE ENLIVE) (ENSURE ENLIVE) liquid 237 mL  237 mL Oral BID BM Opyd, Timothy S, MD      . insulin aspart (novoLOG) injection 0-9 Units  0-9 Units Subcutaneous TID WC Darrick Meigs, Marge Duncans, MD      . insulin glargine (LANTUS) injection 10 Units  10 Units Subcutaneous Daily Oswald Hillock, MD   10 Units at 02/10/17 1026     No Known Allergies  ROS:   All other systems reviewed & are negative except per HPI or as noted below: Constitutional:  No fevers, chills, sweats.  Weight stable Eyes:  No vision changes, No discharge HENT:  No sore throats, nasal  drainage Lymph: No neck swelling, No bruising easily Pulmonary:  No cough, productive sputum CV: No orthopnea, PND  Patient walks 20 minutes for about 1 miles without difficulty.  No exertional chest/neck/shoulder/arm pain. GI:  No personal nor family history of GI/colon cancer, inflammatory bowel disease, irritable bowel syndrome, allergy such as Celiac Sprue, dietary/dairy problems, colitis, ulcers nor gastritis.  No recent sick contacts/gastroenteritis.  No travel outside the country.  No changes in diet. Renal: No UTIs, No hematuria Genital:  No drainage, bleeding, masses Musculoskeletal: No severe joint pain.  Good ROM major joints Skin:  No sores or lesions.  No rashes Heme/Lymph:  No easy bleeding.  No swollen lymph nodes Neuro: No focal weakness/numbness.  No seizures Psych: No suicidal ideation.  No hallucinations  BP (!) 157/76   Pulse (!) 103   Temp 98.9 F (37.2 C) (Oral)   Resp 14   Ht _0  (1.448 m)   Wt 35.8 kg (  78 lb 14.8 oz)   LMP 02/07/2017   SpO2 100%   BMI 17.08 kg/m   Physical Exam: General: Pt awake/alert/oriented x4 in moderate acute distress Eyes: PERRL, normal EOM. Sclera nonicteric Neuro: CN II-XII intact w/o focal sensory/motor deficits. Lymph: No head/neck/groin lymphadenopathy Psych:  No delerium/psychosis/paranoia HENT: Normocephalic, Mucus membranes moist.  No thrush Neck: Supple, No tracheal deviation Chest: No pain.  Good respiratory excursion. CV:  Pulses intact.  Regular rhythm  Abdomen: Moderately distended.  Obvious large firm mass about the size of a basketball that comes above her umbilicus.  She has a softball-sized soft tissue mass that is mobile but not reducible at her umbilical region.  Correlates with the incarcerated ventral hernia.  There is no phlegmon or pain or toxicity.     Gen:  No inguinal hernias.  No inguinal lymphadenopathy.   Ext:  SCDs BLE.  No significant edema.  No cyanosis Skin: No petechiae / purpurea.  No major  sores Musculoskeletal: No severe joint pain.  Good ROM major joints   Results:   Labs: Results for orders placed or performed during the hospital encounter of 02/09/17 (from the past 48 hour(s))  Lipase, blood     Status: None   Collection Time: 02/09/17  1:56 PM  Result Value Ref Range   Lipase 21 11 - 51 U/L  Comprehensive metabolic panel     Status: Abnormal   Collection Time: 02/09/17  1:56 PM  Result Value Ref Range   Sodium 131 (L) 135 - 145 mmol/L   Potassium 5.2 (H) 3.5 - 5.1 mmol/L   Chloride 103 101 - 111 mmol/L   CO2 9 (L) 22 - 32 mmol/L   Glucose, Bld 380 (H) 65 - 99 mg/dL   BUN 24 (H) 6 - 20 mg/dL   Creatinine, Ser 0.77 0.44 - 1.00 mg/dL   Calcium 9.2 8.9 - 10.3 mg/dL   Total Protein 8.9 (H) 6.5 - 8.1 g/dL   Albumin 3.4 (L) 3.5 - 5.0 g/dL   AST 13 (L) 15 - 41 U/L   ALT 10 (L) 14 - 54 U/L   Alkaline Phosphatase 76 38 - 126 U/L   Total Bilirubin 1.2 0.3 - 1.2 mg/dL   GFR calc non Af Amer >60 >60 mL/min   GFR calc Af Amer >60 >60 mL/min    Comment: (NOTE) The eGFR has been calculated using the CKD EPI equation. This calculation has not been validated in all clinical situations. eGFR's persistently <60 mL/min signify possible Chronic Kidney Disease.    Anion gap 19 (H) 5 - 15  CBC     Status: Abnormal   Collection Time: 02/09/17  1:56 PM  Result Value Ref Range   WBC 10.7 (H) 4.0 - 10.5 K/uL   RBC 4.00 3.87 - 5.11 MIL/uL   Hemoglobin 11.7 (L) 12.0 - 15.0 g/dL   HCT 34.0 (L) 36.0 - 46.0 %   MCV 85.0 78.0 - 100.0 fL   MCH 29.3 26.0 - 34.0 pg   MCHC 34.4 30.0 - 36.0 g/dL   RDW 14.0 11.5 - 15.5 %   Platelets 367 150 - 400 K/uL  Blood gas, venous     Status: Abnormal   Collection Time: 02/09/17  8:07 PM  Result Value Ref Range   FIO2 21.00    pH, Ven 7.153 (LL) 7.250 - 7.430    Comment: CRITICAL RESULT CALLED TO, READ BACK BY AND VERIFIED WITH: Isla Pence, MD AT 2015 ON 02/09/2017 BY Danella Sensing,  RRT, RCP    pCO2, Ven 19.4 (LL) 44.0 - 60.0 mmHg     Comment: CRITICAL RESULT CALLED TO, READ BACK BY AND VERIFIED WITH: JULIE HAVILAND, MD AT 2015 ON 02/09/2017 BY DANIEL JONES, RRT, RCP    pO2, Ven 43.8 32.0 - 45.0 mmHg   Bicarbonate 6.6 (L) 20.0 - 28.0 mmol/L    Comment: CRITICAL RESULT CALLED TO, READ BACK BY AND VERIFIED WITH: Isla Pence, MD AT 2015 ON 02/09/2017 BY DANIEL JONES, RRT, RCP    Acid-base deficit 21.1 (H) 0.0 - 2.0 mmol/L   O2 Saturation 74.9 %   Patient temperature 97.7    Collection site VEIN    Drawn by DRAWN BY RN    Sample type VENOUS   Ethanol     Status: None   Collection Time: 02/09/17  8:08 PM  Result Value Ref Range   Alcohol, Ethyl (B) <10 <10 mg/dL    Comment:        LOWEST DETECTABLE LIMIT FOR SERUM ALCOHOL IS 10 mg/dL FOR MEDICAL PURPOSES ONLY   Urinalysis, Routine w reflex microscopic     Status: Abnormal   Collection Time: 02/09/17  9:15 PM  Result Value Ref Range   Color, Urine YELLOW YELLOW   APPearance CLEAR CLEAR   Specific Gravity, Urine 1.022 1.005 - 1.030   pH 5.0 5.0 - 8.0   Glucose, UA >=500 (A) NEGATIVE mg/dL   Hgb urine dipstick LARGE (A) NEGATIVE   Bilirubin Urine NEGATIVE NEGATIVE   Ketones, ur 80 (A) NEGATIVE mg/dL   Protein, ur 100 (A) NEGATIVE mg/dL   Nitrite NEGATIVE NEGATIVE   Leukocytes, UA TRACE (A) NEGATIVE   RBC / HPF 6-30 0 - 5 RBC/hpf   WBC, UA 0-5 0 - 5 WBC/hpf   Bacteria, UA NONE SEEN NONE SEEN   Squamous Epithelial / LPF 6-30 (A) NONE SEEN   Mucus PRESENT   Pregnancy, urine     Status: None   Collection Time: 02/09/17  9:15 PM  Result Value Ref Range   Preg Test, Ur NEGATIVE NEGATIVE    Comment:        THE SENSITIVITY OF THIS METHODOLOGY IS >20 mIU/mL.   CBG monitoring, ED     Status: Abnormal   Collection Time: 02/09/17  9:38 PM  Result Value Ref Range   Glucose-Capillary 339 (H) 65 - 99 mg/dL  CBG monitoring, ED     Status: Abnormal   Collection Time: 02/09/17 11:02 PM  Result Value Ref Range   Glucose-Capillary 299 (H) 65 - 99 mg/dL  Glucose,  capillary     Status: Abnormal   Collection Time: 02/09/17 11:36 PM  Result Value Ref Range   Glucose-Capillary 245 (H) 65 - 99 mg/dL   Comment 1 Notify RN    Comment 2 Document in Chart   Basic metabolic panel     Status: Abnormal   Collection Time: 02/10/17 12:01 AM  Result Value Ref Range   Sodium 137 135 - 145 mmol/L   Potassium 4.4 3.5 - 5.1 mmol/L   Chloride 111 101 - 111 mmol/L   CO2 9 (L) 22 - 32 mmol/L   Glucose, Bld 241 (H) 65 - 99 mg/dL   BUN 27 (H) 6 - 20 mg/dL   Creatinine, Ser 0.78 0.44 - 1.00 mg/dL   Calcium 8.9 8.9 - 10.3 mg/dL   GFR calc non Af Amer >60 >60 mL/min   GFR calc Af Amer >60 >60 mL/min    Comment: (NOTE) The  eGFR has been calculated using the CKD EPI equation. This calculation has not been validated in all clinical situations. eGFR's persistently <60 mL/min signify possible Chronic Kidney Disease.    Anion gap 17 (H) 5 - 15  MRSA PCR Screening     Status: None   Collection Time: 02/10/17 12:01 AM  Result Value Ref Range   MRSA by PCR NEGATIVE NEGATIVE    Comment:        The GeneXpert MRSA Assay (FDA approved for NASAL specimens only), is one component of a comprehensive MRSA colonization surveillance program. It is not intended to diagnose MRSA infection nor to guide or monitor treatment for MRSA infections.   Glucose, capillary     Status: Abnormal   Collection Time: 02/10/17 12:53 AM  Result Value Ref Range   Glucose-Capillary 207 (H) 65 - 99 mg/dL   Comment 1 Notify RN   Glucose, capillary     Status: Abnormal   Collection Time: 02/10/17  1:56 AM  Result Value Ref Range   Glucose-Capillary 179 (H) 65 - 99 mg/dL   Comment 1 Document in Chart   Glucose, capillary     Status: Abnormal   Collection Time: 02/10/17  2:57 AM  Result Value Ref Range   Glucose-Capillary 173 (H) 65 - 99 mg/dL   Comment 1 Notify RN    Comment 2 Document in Chart   hCG, serum, qualitative     Status: None   Collection Time: 02/10/17  3:15 AM  Result Value  Ref Range   Preg, Serum NEGATIVE NEGATIVE    Comment:        THE SENSITIVITY OF THIS METHODOLOGY IS >10 mIU/mL.   Basic metabolic panel     Status: Abnormal   Collection Time: 02/10/17  3:19 AM  Result Value Ref Range   Sodium 134 (L) 135 - 145 mmol/L   Potassium 4.0 3.5 - 5.1 mmol/L   Chloride 110 101 - 111 mmol/L   CO2 14 (L) 22 - 32 mmol/L   Glucose, Bld 178 (H) 65 - 99 mg/dL   BUN 24 (H) 6 - 20 mg/dL   Creatinine, Ser 0.51 0.44 - 1.00 mg/dL   Calcium 8.4 (L) 8.9 - 10.3 mg/dL   GFR calc non Af Amer >60 >60 mL/min   GFR calc Af Amer >60 >60 mL/min    Comment: (NOTE) The eGFR has been calculated using the CKD EPI equation. This calculation has not been validated in all clinical situations. eGFR's persistently <60 mL/min signify possible Chronic Kidney Disease.    Anion gap 10 5 - 15  Glucose, capillary     Status: Abnormal   Collection Time: 02/10/17  4:01 AM  Result Value Ref Range   Glucose-Capillary 146 (H) 65 - 99 mg/dL  Glucose, capillary     Status: Abnormal   Collection Time: 02/10/17  4:59 AM  Result Value Ref Range   Glucose-Capillary 150 (H) 65 - 99 mg/dL   Comment 1 Notify RN    Comment 2 Document in Chart   Glucose, capillary     Status: Abnormal   Collection Time: 02/10/17  6:01 AM  Result Value Ref Range   Glucose-Capillary 143 (H) 65 - 99 mg/dL   Comment 1 Notify RN    Comment 2 Document in Chart   Glucose, capillary     Status: Abnormal   Collection Time: 02/10/17  6:51 AM  Result Value Ref Range   Glucose-Capillary 126 (H) 65 - 99 mg/dL  Comment 1 Notify RN    Comment 2 Document in Chart   Glucose, capillary     Status: Abnormal   Collection Time: 02/10/17  7:44 AM  Result Value Ref Range   Glucose-Capillary 118 (H) 65 - 99 mg/dL   Comment 1 Notify RN   Basic metabolic panel     Status: Abnormal   Collection Time: 02/10/17  7:56 AM  Result Value Ref Range   Sodium 133 (L) 135 - 145 mmol/L   Potassium 3.9 3.5 - 5.1 mmol/L   Chloride 109 101  - 111 mmol/L   CO2 14 (L) 22 - 32 mmol/L   Glucose, Bld 142 (H) 65 - 99 mg/dL   BUN 23 (H) 6 - 20 mg/dL   Creatinine, Ser 0.47 0.44 - 1.00 mg/dL   Calcium 8.4 (L) 8.9 - 10.3 mg/dL   GFR calc non Af Amer >60 >60 mL/min   GFR calc Af Amer >60 >60 mL/min    Comment: (NOTE) The eGFR has been calculated using the CKD EPI equation. This calculation has not been validated in all clinical situations. eGFR's persistently <60 mL/min signify possible Chronic Kidney Disease.    Anion gap 10 5 - 15  Glucose, capillary     Status: Abnormal   Collection Time: 02/10/17  8:46 AM  Result Value Ref Range   Glucose-Capillary 163 (H) 65 - 99 mg/dL   Comment 1 Notify RN   Glucose, capillary     Status: Abnormal   Collection Time: 02/10/17  9:43 AM  Result Value Ref Range   Glucose-Capillary 192 (H) 65 - 99 mg/dL   Comment 1 Notify RN   Glucose, capillary     Status: Abnormal   Collection Time: 02/10/17 10:15 AM  Result Value Ref Range   Glucose-Capillary 179 (H) 65 - 99 mg/dL   Comment 1 Notify RN     Imaging / Studies: US Pelvis Complete  Result Date: 02/07/2017 CLINICAL DATA:  Abdominal and pelvic pain. Abnormal CT scan from earlier today. EXAM: TRANSABDOMINAL ULTRASOUND OF PELVIS TECHNIQUE: Transabdominal ultrasound examination of the pelvis was performed including evaluation of the uterus, ovaries, adnexal regions, and pelvic cul-de-sac. COMPARISON:  CT scan from earlier today FINDINGS: Uterus Measurements: 12.3 x 4.4 x 6.0 cm. Two pelvic masses are identified. The first is described in the right pelvis measuring 19 x 11 x 15 cm. This likely correlates with the mass seen on CT imaging, thought to be contiguous with the uterine fundus on CT imaging. The second was seen to the left measuring 9 x 7 x 8 cm. Both masses appear solid. No separate ovaries are seen. The cystic mass in the left lower abdomen/ upper pelvis on the CT scan was not visualized on this study. Endometrium Thickness: 5 mm.   Difficult to assess due to the large masses. Right ovary There is a mass in the right side of the pelvis. Whether this arises from the ovary or uterus is unclear on this ultrasound. This was thought to most likely be uterine in origin on the CT from earlier today. No separate ovary identified. Left ovary There is a mass in the left side of the pelvis. Whether this arises from the ovary or uterus is unclear on this ultrasound. The cystic mass seen in the left side of the pelvis/lower abdomen on today's CT scan is not visualized on this study. Other findings:  Trace fluid, likely physiologic. IMPRESSION: 1. Two masses are seen in the pelvis, 1 to the right  measuring up to 19 cm and 1 to the left measuring up to 9 cm. On this study, it is unclear whether the masses are adnexal/ovarian versus uterine in origin. The largest mass on the right was thought to most likely arise from the uterus on the recent CT scan raising the possibility of a very large exophytic fibroid. Given the very large size, malignant degeneration of a fibroid is not excluded. The complex cystic mass in the lower abdomen/upper pelvis to the left is not seen on this study. Recommended MRI of the pelvis to better assess the uterus, the masses seen on both the CT and ultrasound, the adnexae, and the complex cystic mass seen to the left on today's CT scan. Recommend gynecologic consultation. Electronically Signed   By: Dorise Bullion III M.D   On: 02/07/2017 17:48   Ct Abdomen Pelvis W Contrast  Result Date: 02/07/2017 CLINICAL DATA:  Patient here with complaints of abdominal pain. Also states that she has acid reflux. Nausea/vomiting. Diabetic. ^149m ISOVUE-300 IOPAMIDOL (ISOVUE-300) INJECTION 61% EXAM: CT ABDOMEN AND PELVIS WITH CONTRAST TECHNIQUE: Multidetector CT imaging of the abdomen and pelvis was performed using the standard protocol following bolus administration of intravenous contrast. CONTRAST:  102mISOVUE-300 IOPAMIDOL (ISOVUE-300)  INJECTION 61% COMPARISON:  Ultrasound, 04/08/2007. FINDINGS: Lower chest: Clear lung bases.  Heart normal in size. Hepatobiliary: No focal liver abnormality is seen. Status post cholecystectomy. No biliary dilatation. Pancreas: Unremarkable. No pancreatic ductal dilatation or surrounding inflammatory changes. Spleen: Normal in size without focal abnormality. Adrenals/Urinary Tract: Adrenal glands are unremarkable. Kidneys are normal, without renal calculi, focal lesion, or hydronephrosis. Bladder is unremarkable. Stomach/Bowel: No bowel obstruction. No wall thickening or inflammatory change. Colon and small bowel is displaced by a large mass in the central peritoneal cavity extending from the pelvis. Appendix not visualized. Vascular/Lymphatic: Vessels are unremarkable. No discrete pathologically enlarged lymph nodes. Reproductive: There is a large heterogeneous solid mass that extends from the right pelvis into the abdomen occupying much of the peritoneal cavity. Mass measures 21 x 12 x 19 cm. There is a smaller mass with a significant cystic component that extends from the left upper pelvis along the left retroperitoneum. It measures 10 x 7 x 8 cm. The uterus is deviated to the left. The uterine fundus is contiguous with the larger mass. There is a small cystic component along the right inferolateral margin of the larger mass with a small amount of adjacent fluid. Other: There is a paraumbilical hernia containing fat with increased attenuation and multiple prominent vessels. The hernia measures 8 x 4 x 7 cm. Musculoskeletal: No acute or significant osseous findings. IMPRESSION: 1. Large masses as detailed, which are likely gynecologic in origin. The larger mass, which occupies much of the peritoneal cavity, extends from the right pelvis, measuring 21 cm in greatest dimension. It is contiguous with the uterine fundus. A massive exophytic fibroid is possible. Malignant degeneration should be considered given the  size. Alternatively, this may reflect a solid ovarian mass. The smaller mass extends from the pelvis to the left lower quadrant retroperitoneum, with a significant cystic component. It measures 8 cm in greatest dimension. Ovarian origin suspected. 2. 8 cm paraumbilical hernia. Fat within the hernia has increased attenuation consistent with congestion. The hernia lies anterior to the large peritoneal mass. 3. Status post cholecystectomy. No other abnormalities. Electronically Signed   By: DaLajean Manes.D.   On: 02/07/2017 15:54    Medications / Allergies: per chart  Antibiotics: Anti-infectives (From admission, onward)  None        Note: Portions of this report may have been transcribed using voice recognition software. Every effort was made to ensure accuracy; however, inadvertent computerized transcription errors may be present.   Any transcriptional errors that result from this process are unintentional.    Adin Hector, M.D., F.A.C.S. Gastrointestinal and Minimally Invasive Surgery [facility] [address] [place], [place] [address] [phone] Main / Paging   02/10/2017

## 2017-02-10 NOTE — Progress Notes (Signed)
[facility]  Called to Dr Darrick Meigs.Orders given for lantus insulin and to follow diabetic protocol.

## 2017-02-10 NOTE — Telephone Encounter (Signed)
Called and left the patient a message yesterday to call the office back. Patient needs a patient to be seen. Attempted to call the patient today to make a appt but the patient is in the hospital.

## 2017-02-10 NOTE — Progress Notes (Signed)
Report called to Brunetta Jeans, RN at [facility]. [facility] notified and transport arranged. Will pass onto oncoming RN.

## 2017-02-10 NOTE — Progress Notes (Signed)
[facility]  PROGRESS NOTE  Jovonda Selner PYP:[id number] DOB: 12-31-1975 DOA: 02/09/2017 PCP: Patient, No Pcp Per   Brief HPI:    41 y.o. female with medical history significant for diabetes mellitus and ventral hernia, presenting to the emergency department for evaluation of abdominal pain.  Patient reports constant moderate burning abdominal pain for the past 3-4 days associated with nausea and nonbloody vomiting, but no diarrhea or fevers.  She was evaluated for these complaints in the emergency department 2 days ago, noted to have a large pelvic mass on CT, and outpatient follow-up was arranged with gynecologic surgeon.  Patient was prescribed oxycodone and Zofran and discharged from the emergency department, but continues to experience these symptoms which have worsened slightly since leaving the ED 2 days ago. Patient is homeless, does not have a PCP, and does not have any medications for her diabetes.       Subjective   Patient seen and examined, DKA has resolved.  BMP this morning shows anion gap of 10.   Assessment/Plan:     1. Diabetic ketoacidosis-resolved, will discontinue IV insulin.  Start Lantus 10 units subcu daily, sliding scale insulin with NovoLog.  Anion gap is 10.  CO2 still low likely from hyperchloremic acidosis.  We will repeat BMP at 12:00 PM. 2. Uncontrolled diabetes mellitus-patient's hemoglobin A1c is 11.7.  We will consult diabetes coordinator.  Patient might require insulin at home.  Patient is homeless so it can be barriers for getting medications as outpatient.  Will consult case management and social work.  Continue Lantus 10 units subcu daily, sliding scale insulin NovoLog.  Start carbohydrate modified diet. 3. Pelvic mass-patient had a large pelvic mass noted on CT on 02/07/2017.  Likely fibroid.  Patient is supposed to follow-up with GYN as outpatient. 4. Paraumbilical hernia-CT scan done on 02/07/2017 showed 8 cm periumbilical hernia.  Fat within  the hernia has increased attenuation consistent with congestion.  Hernia lies anterior to the large peritoneal mass.  I have consulted general surgery for further recommendations.    DVT prophylaxis: Lovenox  Code Status: Full code  Family Communication: No family present at bedside  Disposition Plan: Likely home in 1-2 days.   Consultants:  None  Procedures:  None  Continuous infusions . dextrose 5 % and 0.45% NaCl 100 mL/hr at 02/10/17 0847      Antibiotics:   Anti-infectives (From admission, onward)   None       Objective   Vitals:   02/10/17 0400 02/10/17 0600 02/10/17 0800 02/10/17 0833  BP: 124/65 133/67 (!) 163/80   Pulse: (!) 104 (!) 102 (!) 103   Resp: 17 15 17    Temp: 97.8 F (36.6 C)   98.9 F (37.2 C)  TempSrc: Oral   Oral  SpO2: 98% 100% 100%   Weight:      Height:        Intake/Output Summary (Last 24 hours) at 02/10/2017 0950 Last data filed at 02/10/2017 0800 Gross per 24 hour  Intake 424.7 ml  Output -  Net 424.7 ml   Filed Weights   02/09/17 2148 02/09/17 2337  Weight: 47.6 kg (105 lb) 35.8 kg (78 lb 14.8 oz)     Physical Examination:  Physical Exam: Eyes: No icterus, extraocular muscles intact  Mouth: Oral mucosa is moist, no lesions on palate,  Neck: Supple, no deformities, masses, or tenderness Lungs: Normal respiratory effort, bilateral clear to auscultation, no crackles or wheezes.  Heart: Regular rate and rhythm, S1 and  S2 normal, no murmurs, rubs auscultated Abdomen: BS normoactive,soft,nondistended,non-tender to palpation,no organomegaly Extremities: No pretibial edema, no erythema, no cyanosis, no clubbing Neuro : Alert and oriented to time, place and person, No focal deficits  Skin: No rashes seen on exam       Data Reviewed: I have personally reviewed following labs and imaging studies  CBG: Recent Labs  Lab 02/10/17 0601 02/10/17 0651 02/10/17 0744 02/10/17 0846 02/10/17 0943  GLUCAP 143* 126*  118* 163* 192*    CBC: Recent Labs  Lab 02/07/17 1321 02/09/17 1356  WBC 11.1* 10.7*  NEUTROABS 9.8*  --   HGB 11.3* 11.7*  HCT 33.7* 34.0*  MCV 83.2 85.0  PLT PLATELET CLUMPS NOTED ON SMEAR, COUNT APPEARS ADEQUATE 786    Basic Metabolic Panel: Recent Labs  Lab 02/07/17 1351 02/09/17 1356 02/10/17 0001 02/10/17 0319 02/10/17 0756  NA 135 131* 137 134* 133*  K 4.4 5.2* 4.4 4.0 3.9  CL 102 103 111 110 109  CO2 16* 9* 9* 14* 14*  GLUCOSE 419* 380* 241* 178* 142*  BUN 16 24* 27* 24* 23*  CREATININE 0.66 0.77 0.78 0.51 0.47  CALCIUM 9.1 9.2 8.9 8.4* 8.4*    Recent Results (from the past 240 hour(s))  MRSA PCR Screening     Status: None   Collection Time: 02/10/17 12:01 AM  Result Value Ref Range Status   MRSA by PCR NEGATIVE NEGATIVE Final    Comment:        The GeneXpert MRSA Assay (FDA approved for NASAL specimens only), is one component of a comprehensive MRSA colonization surveillance program. It is not intended to diagnose MRSA infection nor to guide or monitor treatment for MRSA infections.      Liver Function Tests: Recent Labs  Lab 02/07/17 1351 02/09/17 1356  AST 20 13*  ALT 13* 10*  ALKPHOS 60 76  BILITOT 1.4* 1.2  PROT 8.2* 8.9*  ALBUMIN 3.4* 3.4*   Recent Labs  Lab 02/07/17 1351 02/09/17 1356  LIPASE 19 21      Studies: No results found.  Scheduled Meds: . lip balm      . enoxaparin (LOVENOX) injection  30 mg Subcutaneous QHS  . feeding supplement (ENSURE ENLIVE)  237 mL Oral BID BM  . Influenza vac split quadrivalent PF  0.5 mL Intramuscular Tomorrow-1000  . insulin aspart  0-9 Units Subcutaneous TID WC  . insulin glargine  10 Units Subcutaneous Daily  . pneumococcal 23 valent vaccine  0.5 mL Intramuscular Tomorrow-1000      Time spent: 20 minutes  [facility] Pager [phone]. If 7PM-7AM, please contact night-coverage at [email or web address], Office  [phone]  password [place]  02/10/2017, 9:50 AM   LOS: 1 day

## 2017-02-10 NOTE — Care Management Note (Signed)
Case Management Note  Patient Details  Name: [name] MRN: [id number] Date of Birth: [birth year]  Subjective/Objective:                  hyperglycemia  Action/Plan: Date: February 10, 2017 [name]  [phone] Chart and notes review for patient progress and needs. Will follow for case management and discharge needs. Next review date: [id number] Expected Discharge Date:  (unknown)               Expected Discharge Plan:  Home/Self Care  In-House Referral:     Discharge planning Services  CM Consult  Post Acute Care Choice:    Choice offered to:     DME Arranged:    DME Agency:     HH Arranged:    HH Agency:     Status of Service:  In process, will continue to follow  If discussed at Long Length of Stay Meetings, dates discussed:    Additional Comments:  Leeroy Cha, RN 02/10/2017, 8:44 AM

## 2017-02-11 DIAGNOSIS — Q7163 Lobster-claw hand, bilateral: Secondary | ICD-10-CM | POA: Insufficient documentation

## 2017-02-11 DIAGNOSIS — Q7133 Congenital absence of hand and finger, bilateral: Secondary | ICD-10-CM | POA: Insufficient documentation

## 2017-02-12 LAB — GLUCOSE, CAPILLARY: Glucose-Capillary: 267 mg/dL — ABNORMAL HIGH (ref 65–99)

## 2017-02-19 DIAGNOSIS — Q02 Microcephaly: Secondary | ICD-10-CM | POA: Insufficient documentation

## 2017-02-27 ENCOUNTER — Encounter: Payer: Self-pay | Admitting: *Deleted

## 2017-02-27 ENCOUNTER — Other Ambulatory Visit: Payer: Self-pay

## 2017-02-27 ENCOUNTER — Emergency Department: Payer: Medicaid Other

## 2017-02-27 ENCOUNTER — Emergency Department
Admission: EM | Admit: 2017-02-27 | Discharge: 2017-02-28 | Payer: Medicaid Other | Attending: Emergency Medicine | Admitting: Emergency Medicine

## 2017-02-27 ENCOUNTER — Ambulatory Visit (HOSPITAL_COMMUNITY)
Admission: AD | Admit: 2017-02-27 | Discharge: 2017-02-27 | Disposition: A | Discharge status: Home / Self Care | Payer: Medicaid Other | Source: Other Acute Inpatient Hospital | Attending: Emergency Medicine | Admitting: Emergency Medicine

## 2017-02-27 DIAGNOSIS — Y849 Medical procedure, unspecified as the cause of abnormal reaction of the patient, or of later complication, without mention of misadventure at the time of the procedure: Secondary | ICD-10-CM | POA: Diagnosis not present

## 2017-02-27 DIAGNOSIS — E11649 Type 2 diabetes mellitus with hypoglycemia without coma: Secondary | ICD-10-CM | POA: Diagnosis not present

## 2017-02-27 DIAGNOSIS — Z9889 Other specified postprocedural states: Secondary | ICD-10-CM

## 2017-02-27 DIAGNOSIS — K219 Gastro-esophageal reflux disease without esophagitis: Secondary | ICD-10-CM | POA: Insufficient documentation

## 2017-02-27 DIAGNOSIS — Z79899 Other long term (current) drug therapy: Secondary | ICD-10-CM | POA: Diagnosis not present

## 2017-02-27 DIAGNOSIS — T8851XA Hypothermia following anesthesia, initial encounter: Secondary | ICD-10-CM | POA: Diagnosis present

## 2017-02-27 DIAGNOSIS — X58XXXA Exposure to other specified factors, initial encounter: Secondary | ICD-10-CM | POA: Diagnosis not present

## 2017-02-27 DIAGNOSIS — I10 Essential (primary) hypertension: Secondary | ICD-10-CM | POA: Insufficient documentation

## 2017-02-27 DIAGNOSIS — Z794 Long term (current) use of insulin: Secondary | ICD-10-CM | POA: Insufficient documentation

## 2017-02-27 DIAGNOSIS — R1907 Generalized intra-abdominal and pelvic swelling, mass and lump: Secondary | ICD-10-CM | POA: Diagnosis not present

## 2017-02-27 DIAGNOSIS — R680 Hypothermia, not associated with low environmental temperature: Secondary | ICD-10-CM | POA: Diagnosis not present

## 2017-02-27 DIAGNOSIS — E7800 Pure hypercholesterolemia, unspecified: Secondary | ICD-10-CM | POA: Insufficient documentation

## 2017-02-27 DIAGNOSIS — E162 Hypoglycemia, unspecified: Secondary | ICD-10-CM

## 2017-02-27 DIAGNOSIS — T68XXXA Hypothermia, initial encounter: Secondary | ICD-10-CM

## 2017-02-27 DIAGNOSIS — A419 Sepsis, unspecified organism: Secondary | ICD-10-CM

## 2017-02-27 HISTORY — DX: Congenital absence of unspecified limb(s): Q73.0

## 2017-02-27 HISTORY — DX: Microcephaly: Q02

## 2017-02-27 HISTORY — DX: Lobster-claw hand, unspecified hand: Q71.60

## 2017-02-27 HISTORY — DX: Intra-abdominal and pelvic swelling, mass and lump, unspecified site: R19.00

## 2017-02-27 LAB — HEPATIC FUNCTION PANEL
ALBUMIN: 3.7 g/dL (ref 3.5–5.0)
ALT: 18 U/L (ref 14–54)
AST: 24 U/L (ref 15–41)
Alkaline Phosphatase: 82 U/L (ref 38–126)
BILIRUBIN TOTAL: 0.2 mg/dL — AB (ref 0.3–1.2)
Total Protein: 8.7 g/dL — ABNORMAL HIGH (ref 6.5–8.1)

## 2017-02-27 LAB — URINALYSIS, COMPLETE (UACMP) WITH MICROSCOPIC
Bacteria, UA: NONE SEEN
Bilirubin Urine: NEGATIVE
Hgb urine dipstick: NEGATIVE
Ketones, ur: NEGATIVE mg/dL
Leukocytes, UA: NEGATIVE
Nitrite: NEGATIVE
PH: 5 (ref 5.0–8.0)
Protein, ur: 100 mg/dL — AB
SPECIFIC GRAVITY, URINE: 1.016 (ref 1.005–1.030)

## 2017-02-27 LAB — BASIC METABOLIC PANEL
Anion gap: 12 (ref 5–15)
BUN: 20 mg/dL (ref 6–20)
CO2: 25 mmol/L (ref 22–32)
Calcium: 8.9 mg/dL (ref 8.9–10.3)
Chloride: 98 mmol/L — ABNORMAL LOW (ref 101–111)
Creatinine, Ser: 0.38 mg/dL — ABNORMAL LOW (ref 0.44–1.00)
GFR calc Af Amer: >60 mL/min (ref >60)
GFR calc non Af Amer: >60 mL/min (ref >60)
Glucose, Bld: 86 mg/dL (ref 65–99)
POTASSIUM: 3.5 mmol/L (ref 3.5–5.1)
SODIUM: 135 mmol/L (ref 135–145)

## 2017-02-27 LAB — GLUCOSE, CAPILLARY
GLUCOSE-CAPILLARY: 87 mg/dL (ref 65–99)
GLUCOSE-CAPILLARY: 316 mg/dL — AB (ref 65–99)
Glucose-Capillary: 105 mg/dL — ABNORMAL HIGH (ref 65–99)
Glucose-Capillary: 174 mg/dL — ABNORMAL HIGH (ref 65–99)
Glucose-Capillary: 213 mg/dL — ABNORMAL HIGH (ref 65–99)
Glucose-Capillary: 351 mg/dL — ABNORMAL HIGH (ref 65–99)

## 2017-02-27 LAB — CBC
HCT: 31.7 % — ABNORMAL LOW (ref 35.0–47.0)
Hemoglobin: 10.8 g/dL — ABNORMAL LOW (ref 12.0–16.0)
MCH: 29.6 pg (ref 26.0–34.0)
MCHC: 34 g/dL (ref 32.0–36.0)
MCV: 86.9 fL (ref 80.0–100.0)
Platelets: 486 10*3/uL — ABNORMAL HIGH (ref 150–440)
RBC: 3.65 MIL/uL — ABNORMAL LOW (ref 3.80–5.20)
RDW: 15.7 % — ABNORMAL HIGH (ref 11.5–14.5)
WBC: 6.4 10*3/uL (ref 3.6–11.0)

## 2017-02-27 LAB — LACTIC ACID, PLASMA
LACTIC ACID, VENOUS: 3.1 mmol/L — AB (ref 0.5–1.9)
Lactic Acid, Venous: 2.4 mmol/L (ref 0.5–1.9)

## 2017-02-27 LAB — LIPASE, BLOOD: LIPASE: 25 U/L (ref 11–51)

## 2017-02-27 MED ORDER — GENERIC EXTERNAL MEDICATION
Status: DC
Start: ? — End: 2017-02-27

## 2017-02-27 MED ORDER — IOPAMIDOL (ISOVUE-300) INJECTION 61%: 75.0000 mL | Freq: Once | INTRAVENOUS | Status: DC | PRN

## 2017-02-27 MED ORDER — ENOXAPARIN SODIUM 30 MG/0.3ML ~~LOC~~ SOLN
30.00 mg | SUBCUTANEOUS | Status: DC
Start: 2017-02-26 — End: 2017-02-27

## 2017-02-27 MED ORDER — MAGNESIUM OXIDE 400 MG PO TABS
400.00 mg | ORAL_TABLET | ORAL | Status: DC
Start: 2017-02-26 — End: 2017-02-27

## 2017-02-27 MED ORDER — POLYETHYLENE GLYCOL 3350 17 G PO PACK
17.00 | PACK | ORAL | Status: DC
Start: ? — End: 2017-02-27

## 2017-02-27 MED ORDER — SODIUM CHLORIDE 0.9 % IV BOLUS (SEPSIS)
1000.0000 mL | Freq: Once | INTRAVENOUS | Status: AC
Start: 2017-02-27 — End: 2017-02-27
  Administered 2017-02-27: 1000 mL via INTRAVENOUS

## 2017-02-27 MED ORDER — PROMETHAZINE HCL 25 MG/ML IJ SOLN
6.25 mg | INTRAMUSCULAR | Status: DC
Start: ? — End: 2017-02-27

## 2017-02-27 MED ORDER — METFORMIN HCL 500 MG PO TABS
500.00 mg | ORAL_TABLET | ORAL | Status: DC
Start: 2017-02-25 — End: 2017-02-27

## 2017-02-27 MED ORDER — ONDANSETRON 4 MG PO TBDP
4.00 mg | ORAL_TABLET | ORAL | Status: DC
Start: ? — End: 2017-02-27

## 2017-02-27 MED ORDER — DOCUSATE SODIUM 100 MG PO CAPS
100.00 mg | ORAL_CAPSULE | ORAL | Status: DC
Start: ? — End: 2017-02-27

## 2017-02-27 MED ORDER — PIPERACILLIN-TAZOBACTAM 3.375 G IVPB: 3.3750 g | Freq: Three times a day (TID) | INTRAVENOUS | Status: DC

## 2017-02-27 MED ORDER — THIAMINE HCL 100 MG PO TABS
100.00 mg | ORAL_TABLET | ORAL | Status: DC
Start: 2017-02-26 — End: 2017-02-27

## 2017-02-27 MED ORDER — PIPERACILLIN-TAZOBACTAM 3.375 G IVPB 30 MIN
3.3750 g | Freq: Once | INTRAVENOUS | Status: AC
  Administered 2017-02-27: 3.375 g via INTRAVENOUS
  Filled 2017-02-27: qty 50

## 2017-02-27 MED ORDER — INSULIN NPH ISOPHANE & REGULAR (70-30) 100 UNIT/ML ~~LOC~~ SUSP
36.00 | SUBCUTANEOUS | Status: DC
Start: ? — End: 2017-02-27

## 2017-02-27 MED ORDER — IOPAMIDOL (ISOVUE-300) INJECTION 61%
50.0000 mL | Freq: Once | INTRAVENOUS | Status: AC | PRN
  Administered 2017-02-27: 50 mL via INTRAVENOUS

## 2017-02-27 MED ORDER — SODIUM CHLORIDE 0.9 % IV BOLUS (SEPSIS)
1000.0000 mL | Freq: Once | INTRAVENOUS | Status: AC
  Administered 2017-02-27: 1000 mL via INTRAVENOUS

## 2017-02-27 MED ORDER — INSULIN NPH ISOPHANE & REGULAR (70-30) 100 UNIT/ML ~~LOC~~ SUSP
20.00 | SUBCUTANEOUS | Status: DC
Start: 2017-02-25 — End: 2017-02-27

## 2017-02-27 MED ORDER — DEXTROSE 50 % IV SOLN
12.50 | INTRAVENOUS | Status: DC
Start: ? — End: 2017-02-27

## 2017-02-27 MED ORDER — ACETAMINOPHEN 325 MG PO TABS
325.00 mg | ORAL_TABLET | ORAL | Status: DC
Start: ? — End: 2017-02-27

## 2017-02-27 NOTE — ED Notes (Signed)
MD at bedside. 

## 2017-02-27 NOTE — ED Notes (Signed)
Pt given graham crackers and peanut butter. Pt  Is informed this is the last snack of the night. This makes pts third snack after having dinner and multiple diet sodas. Lights have been dimmed and pt is watching TV at this time. Pt in NAD at this time.

## 2017-02-27 NOTE — ED Notes (Signed)
Pt drinking juice at this time per MD order.

## 2017-02-27 NOTE — ED Triage Notes (Signed)
Pt to ED via EMS from [facility] after staff reports they found pt unresponsive. EMS repotrs pts glucose was too low to read when they arrived. Pt had a CBG of 500 las night and 300 this morning at 9:00. Pt had breakfast but reports she did not eat lunch after medication administration. Pt became alert and at baseline after EMS gave 1.2 amp D50 and CBG then read 98. In route to ED CBG dropped to 68 and EMS administered second dose of 1.2 amp D50. Pt is alert and oriented upon arrival to ED and talking on phone.   Pt was recently discharged from [facility] 2 weeks ago after having benign masses removed from overies. Surgical site appears to be WNL with no signs of infection noted. PT denies having had fevers at home.

## 2017-02-27 NOTE — ED Notes (Signed)
This RN called care home and was updated on timeline from the day. RN spoke to [place] who is the caregiver (can be reached at [phone]). Pt was discharged from [facility] last night and taken to [facility] in [place]. CBG upon arrival was reported to be 500. 20 units insulin given per orders. This morning pts CBG was 327 and caregiver reports she gave 36 units insulin and metformin. PT ate small amount of eggs and went back to sleep. Pt then woke and reported she felt like her sugars were dropping. Pt drank orange juice and ate peanut butter and then walked around care home. Pt then went back to sleep at 9:00 am. Staff attempted to wake pt at 11:00 am and discovered pt was unresponsive but staff reports she was audibly snoring throughout this time.

## 2017-02-27 NOTE — ED Notes (Signed)
Pt placed under bear hugger after being changed and clean. PT was wet upon arrival and depends was saturated in urine.

## 2017-02-27 NOTE — ED Notes (Signed)
Patient transported to CT 

## 2017-02-27 NOTE — ED Notes (Signed)
Pt in room eating food at this time.

## 2017-02-27 NOTE — ED Provider Notes (Signed)
[facility] Emergency Department Provider Note   ____________________________________________   First MD Initiated Contact with Patient 02/27/17 1409     (approximate)  I have reviewed the triage vital signs and the nursing notes.   HISTORY  Chief Complaint Hypoglycemia    HPI [name] is a 41 y.o. female here for evaluation due to a low blood sugar  Patient currently resides in assisted living where they manage her medications.  Has a history of diabetes and recently discharged after myomectomies from [facility].  Patient reports that she was doing fine, she remembers getting her medicine including her pill and insulin shot this morning.  She was then awoken and reports he sent her here because her blood sugar was low.  He does not recall other events around this visit, she does report that she feels fine now and would like to eat a sandwich.  She does not administer her own medications.  Family at the bedside reports that her facility manages her medications and she has had recently trouble with managing her blood sugars even while at [facility]  She had a recent abdominal surgery and has been recovering well from that.  No recent fevers chills or other complaints.  EMS found the patient to have a blood sugar that was read as "low" she was administered half an amp of D50.  She then had her blood sugar dropped again while in route to the hospital and was given 1 more half amp of D50 prior to arrival.  Patient denies any concerns at present.  Denies pregnancy.  Discharged yesterday from [facility] after GYN surgery.     Past Medical History:  Diagnosis Date  . Diabetes mellitus without complication ([place])   . Ectrodactyly   . GERD (gastroesophageal reflux disease)   . Hypercholesterolemia   . Hypertension   . Microcephaly ([place])   . Pelvic mass   . Ventral hernia     Patient Active Problem List   Diagnosis Date Noted  . Incarcerated ventral  hernia 02/10/2017  . DM (diabetes mellitus) type 2, uncontrolled, with ketoacidosis ([place]) 02/10/2017  . Protein-calorie malnutrition, severe ([place]) 02/10/2017  . H/O pelvic mass   . DKA (diabetic ketoacidoses) ([place]) 02/09/2017  . Hyponatremia 02/09/2017  . Hyperkalemia 02/09/2017  . Homelessness 02/09/2017  . Giant ovarian mass     Past Surgical History:  Procedure Laterality Date  . CHOLECYSTECTOMY    . HAND SURGERY Right     Prior to Admission medications   Medication Sig Start Date End Date Taking? Authorizing Provider  acetaminophen (TYLENOL) 325 MG tablet Take 650 mg by mouth every 6 (six) hours as needed.   Yes [provider]  docusate sodium (COLACE) 100 MG capsule Take 100 mg by mouth daily.    Yes [provider]  ibuprofen (ADVIL,MOTRIN) 600 MG tablet Take 600 mg by mouth every 6 (six) hours as needed.   Yes [provider]  insulin NPH-regular Human (NOVOLIN 70/30) (70-30) 100 UNIT/ML injection Inject 20-36 Units into the skin 2 (two) times daily. INJECT 36 UNITS UNDER THE SKIN EVERY MORNING AND 20 UNITS UNDER THE SKIN EVERY EVENING   Yes [provider]  metFORMIN (GLUCOPHAGE) 500 MG tablet Take by mouth 2 (two) times daily with a meal.   Yes [provider]  senna (SENOKOT) 8.6 MG TABS tablet Take 1 tablet by mouth.   Yes [provider]  insulin glargine (LANTUS) 100 UNIT/ML injection Inject 0.1 mLs (10  Units total) daily into the skin. 02/11/17   Oswald Hillock, MD  ondansetron (ZOFRAN) 4 MG tablet Take 1 tablet (4 mg total) by mouth every 6 (six) hours. 02/07/17   Nat Christen, MD    Allergies Patient has no known allergies.  No family history on file.  Social History Social History   Tobacco Use  . Smoking status: Never Smoker  . Smokeless tobacco: Never Used  Substance Use Topics  . Alcohol use: No  . Drug use: No    Review of Systems Constitutional: No fever/chills Eyes: No visual changes. ENT: No  sore throat. Cardiovascular: Denies chest pain. Respiratory: Denies shortness of breath. Gastrointestinal: No abdominal pain.  No nausea, no vomiting.  No diarrhea.  No constipation.  Patient does reports she is hungry.  Abdominal surgery site has been healing well. Genitourinary: Negative for dysuria. Musculoskeletal: Negative for back pain. Skin: Negative for rash. Neurological: Negative for headaches, focal weakness or numbness.    ____________________________________________   PHYSICAL EXAM:  VITAL SIGNS: ED Triage Vitals  Enc Vitals Group     BP 02/27/17 1421 106/82     Pulse Rate 02/27/17 1421 93     Resp 02/27/17 1421 16     Temp --      Temp Source 02/27/17 1421 Oral     SpO2 02/27/17 1421 100 %     Weight 02/27/17 1422 78 lb (35.4 kg)     Height 02/27/17 1422 4\' 9"  (1.448 m)     Head Circumference --      Peak Flow --      Pain Score --      Pain Loc --      Pain Edu? --      Excl. in [place]? --     Constitutional: Alert and oriented. Well appearing and in no acute distress.  She is very pleasant.  Has a slightly unusual affect, but family reports she is at her normal baseline and does have some chronic cognitive issues. Eyes: Conjunctivae are normal. Head: Atraumatic. Nose: No congestion/rhinnorhea. Mouth/Throat: Mucous membranes are moist. Neck: No stridor.   Cardiovascular: Normal rate, regular rhythm. Grossly normal heart sounds.  Good peripheral circulation. Respiratory: Normal respiratory effort.  No retractions. Lungs CTAB. Gastrointestinal: Soft and nontender. No distention.  Lower abdominal incision clean dry and intact without any extending erythema.  Denies tenderness to palpation of the abdomen in any quadrant Musculoskeletal: No lower extremity tenderness nor edema.  Some rather underdeveloped hands bilaterally.  Chronic.  Moves all extremities with good strength. Neurologic:  Normal speech and language. No gross focal neurologic deficits are  appreciated.  Skin:  Skin is warm, dry and intact. No rash noted. Psychiatric: Mood and affect are slightly elevated, but family reports that her baseline. Speech and behavior are normal.  ____________________________________________   LABS (all labs ordered are listed, but only abnormal results are displayed)  Labs Reviewed  GLUCOSE, CAPILLARY - Abnormal; Notable for the following components:      Result Value   Glucose-Capillary 105 (*)    All other components within normal limits  BLOOD GAS, VENOUS - Abnormal; Notable for the following components:   Bicarbonate 29.8 (*)    Acid-Base Excess 3.3 (*)    All other components within normal limits  CBC - Abnormal; Notable for the following components:   RBC 3.65 (*)    Hemoglobin 10.8 (*)    HCT 31.7 (*)    RDW 15.7 (*)    Platelets 486 (*)  All other components within normal limits  URINALYSIS, COMPLETE (UACMP) WITH MICROSCOPIC - Abnormal; Notable for the following components:   Color, Urine YELLOW (*)    APPearance HAZY (*)    Glucose, UA >=500 (*)    Protein, ur 100 (*)    Squamous Epithelial / LPF 6-30 (*)    All other components within normal limits  BASIC METABOLIC PANEL - Abnormal; Notable for the following components:   Chloride 98 (*)    Creatinine, Ser 0.38 (*)    All other components within normal limits  LACTIC ACID, PLASMA - Abnormal; Notable for the following components:   Lactic Acid, Venous 2.4 (*)    All other components within normal limits  HEPATIC FUNCTION PANEL - Abnormal; Notable for the following components:   Total Protein 8.7 (*)    Total Bilirubin 0.2 (*)    Bilirubin, Direct <0.1 (*)    All other components within normal limits  GLUCOSE, CAPILLARY - Abnormal; Notable for the following components:   Glucose-Capillary 174 (*)    All other components within normal limits  URINE CULTURE  CULTURE, BLOOD (ROUTINE X 2)  CULTURE, BLOOD (ROUTINE X 2)  LIPASE, BLOOD  GLUCOSE, CAPILLARY  LACTIC ACID,  PLASMA  CBG MONITORING, ED  CBG MONITORING, ED  CBG MONITORING, ED  CBG MONITORING, ED  CBG MONITORING, ED  CBG MONITORING, ED  CBG MONITORING, ED  CBG MONITORING, ED  CBG MONITORING, ED  CBG MONITORING, ED  CBG MONITORING, ED  CBG MONITORING, ED  CBG MONITORING, ED  CBG MONITORING, ED  CBG MONITORING, ED   ____________________________________________  EKG  Reviewed and interpreted by me 1410 Heart rate 90 QRS 80 normal sinus rhythm, no evidence of acute ischemia or ectopy ____________________________________________  RADIOLOGY  Dg Chest Portable 1 View  Result Date: 02/27/2017 CLINICAL DATA:  Hypoglycemia.  Postop hypothermia. EXAM: PORTABLE CHEST 1 VIEW COMPARISON:  None. FINDINGS: The heart size and mediastinal contours are within normal limits. Both lungs are clear. The visualized skeletal structures are unremarkable. Cholecystectomy clips are seen in the right upper quadrant. IMPRESSION: No active disease. Electronically Signed   By: Ashley Royalty M.D.   On: 02/27/2017 15:57    Chest x-ray reviewed, negative for acute disease ____________________________________________   PROCEDURES  Procedure(s) performed: None  Procedures  Critical Care performed: No  ____________________________________________   INITIAL IMPRESSION / ASSESSMENT AND PLAN / ED COURSE  Pertinent labs & imaging results that were available during my care of the patient were reviewed by me and considered in my medical decision making (see chart for details).    Clinical Course as of Feb 28 1615  Fri Feb 27, 2017  1454 Patient's core temperature confirmed that 93.8 rectal.  She is alert and oriented, not complaining of any pain.  Her ventral midline surgical site is clean dry and intact.  No evidence of any tenderness to exam.  Examination of the skin does not demonstrate any obvious focal infection.  However, given the patient is only a couple weeks postoperative, now presenting with  hypoglycemia and hypothermia I am significantly concerned this constellation of findings could represent some type of infectious process.  Ordered blood cultures, lactic acid, and we will empirically cover with Zosyn.  Continue to check her glucoses regularly.  Will obtain a CT scan to exclude intra-abdominal abscess, and anticipate admission. Family, including her cousin who presents herself as her closest family member as her brother is currently psychiatrically admitted, is also agreeable with the plan  [  MQ]    Clinical Course User Index [MQ] Delman Kitten, MD   Ongoing care assigned to Dr. Cherylann Banas at approximately 3:45 PM.  Anticipate admission, but patient is recently postoperative and discharge from [facility].  Awaiting CT abdomen and pelvis to evaluate for possible complication of the surgery.  If evidence of concern, patient may need to be transferred back to [facility] for ongoing management if there is concern about the surgical site.  Patient will need to be admitted and continue to follow closely for hypoglycemia, hypothermia, and significantly elevated risk for sepsis now covered with Zosyn.  Source not yet known, but recent postoperative status placing her at high risk for bacteremia.  ____________________________________________   FINAL CLINICAL IMPRESSION(S) / ED DIAGNOSES  Final diagnoses:  Hypothermia, initial encounter  Hypoglycemia  Post-operative state      NEW MEDICATIONS STARTED DURING THIS VISIT:  This SmartLink is deprecated. Use AVSMEDLIST instead to display the medication list for a patient.   Note:  This document was prepared using Dragon voice recognition software and may include unintentional dictation errors.     Delman Kitten, MD 02/27/17 [phone]

## 2017-02-27 NOTE — Progress Notes (Signed)
Pharmacy Antibiotic Note  [name] is a 41 y.o. female admitted on 02/27/2017 with hypoglycemia and suspected intra-abdominal infection.  Pharmacy has been consulted for Zosyn dosing.  Plan: Zosyn 3.375g IV q8h (4 hour infusion).  Height: 4\' 9"  (144.8 cm) Weight: 78 lb (35.4 kg) IBW/kg (Calculated) : 38.6  Temp (24hrs), Avg:96.6 F (35.9 C), Min:93.8 F (34.3 C), Max:98.2 F (36.8 C)  Recent Labs  Lab 02/27/17 1407 02/27/17 1442 02/27/17 1455 02/27/17 1834  WBC 6.4  --   --   --   CREATININE  --  0.38*  --   --   LATICACIDVEN  --   --  2.4* 3.1*    Estimated Creatinine Clearance: 51.2 mL/min (A) (by C-G formula based on SCr of 0.38 mg/dL (L)).    No Known Allergies  Antimicrobials this admission:  Zosyn 11/23 >>    Dose adjustments this admission:   Microbiology results: BCx: 11/23 >> pending UCx: 11/23 >> pending  Thank you for allowing pharmacy to be a part of this patient's care.  Anette Riedel 02/27/2017 9:56 PM

## 2017-02-28 LAB — BLOOD GAS, VENOUS
Acid-Base Excess: 3.3 mmol/L — ABNORMAL HIGH (ref 0.0–2.0)
Bicarbonate: 29.8 mmol/L — ABNORMAL HIGH (ref 20.0–28.0)
PATIENT TEMPERATURE: 37
pCO2, Ven: 54 mmHg (ref 44.0–60.0)
pH, Ven: 7.35 (ref 7.250–7.430)

## 2017-02-28 NOTE — ED Notes (Signed)
I have reviewed and the EMTALA is complete at this time.

## 2017-02-28 NOTE — ED Notes (Signed)
[facility] transport here to take patient to [facility]

## 2017-03-01 LAB — URINE CULTURE: Special Requests: NORMAL

## 2017-03-04 LAB — CULTURE, BLOOD (ROUTINE X 2)
Culture: NO GROWTH
Culture: NO GROWTH
SPECIAL REQUESTS: ADEQUATE
SPECIAL REQUESTS: ADEQUATE

## 2017-03-06 LAB — GLUCOSE, CAPILLARY: Glucose-Capillary: 378 mg/dL — ABNORMAL HIGH (ref 65–99)

## 2017-04-17 ENCOUNTER — Emergency Department (HOSPITAL_COMMUNITY)
Admission: EM | Admit: 2017-04-17 | Discharge: 2017-04-17 | Disposition: A | Discharge status: Home / Self Care | Payer: Medicaid Other | Attending: Emergency Medicine | Admitting: Emergency Medicine

## 2017-04-17 ENCOUNTER — Encounter (HOSPITAL_COMMUNITY): Payer: Self-pay | Admitting: Emergency Medicine

## 2017-04-17 DIAGNOSIS — Z79899 Other long term (current) drug therapy: Secondary | ICD-10-CM | POA: Insufficient documentation

## 2017-04-17 DIAGNOSIS — Z794 Long term (current) use of insulin: Secondary | ICD-10-CM | POA: Diagnosis not present

## 2017-04-17 DIAGNOSIS — E1165 Type 2 diabetes mellitus with hyperglycemia: Secondary | ICD-10-CM | POA: Diagnosis present

## 2017-04-17 DIAGNOSIS — I10 Essential (primary) hypertension: Secondary | ICD-10-CM | POA: Diagnosis not present

## 2017-04-17 DIAGNOSIS — R739 Hyperglycemia, unspecified: Secondary | ICD-10-CM

## 2017-04-17 LAB — BLOOD GAS, VENOUS
Acid-base deficit: 1 mmol/L (ref 0.0–2.0)
Bicarbonate: 24.7 mmol/L (ref 20.0–28.0)
O2 Saturation: 36 %
PCO2 VEN: 48.2 mmHg (ref 44.0–60.0)
PH VEN: 7.331 (ref 7.250–7.430)
Patient temperature: 98.6

## 2017-04-17 LAB — CBG MONITORING, ED
GLUCOSE-CAPILLARY: 580 mg/dL — AB (ref 65–99)
GLUCOSE-CAPILLARY: 331 mg/dL — AB (ref 65–99)

## 2017-04-17 LAB — BASIC METABOLIC PANEL
Anion gap: 8 (ref 5–15)
BUN: 13 mg/dL (ref 6–20)
CHLORIDE: 93 mmol/L — AB (ref 101–111)
CO2: 24 mmol/L (ref 22–32)
Calcium: 8.6 mg/dL — ABNORMAL LOW (ref 8.9–10.3)
Creatinine, Ser: 0.6 mg/dL (ref 0.44–1.00)
GFR calc Af Amer: >60 mL/min (ref >60)
GFR calc non Af Amer: >60 mL/min (ref >60)
GLUCOSE: 585 mg/dL — AB (ref 65–99)
POTASSIUM: 3.9 mmol/L (ref 3.5–5.1)
Sodium: 125 mmol/L — ABNORMAL LOW (ref 135–145)

## 2017-04-17 LAB — URINALYSIS, ROUTINE W REFLEX MICROSCOPIC
BILIRUBIN URINE: NEGATIVE
Glucose, UA: >=500 mg/dL — AB
Ketones, ur: 5 mg/dL — AB
Leukocytes, UA: NEGATIVE
Nitrite: NEGATIVE
Protein, ur: NEGATIVE mg/dL
SPECIFIC GRAVITY, URINE: 1.03 (ref 1.005–1.030)
pH: 5 (ref 5.0–8.0)

## 2017-04-17 LAB — CBC
HEMATOCRIT: 35.7 % — AB (ref 36.0–46.0)
Hemoglobin: 12.1 g/dL (ref 12.0–15.0)
MCH: 28.1 pg (ref 26.0–34.0)
MCHC: 33.9 g/dL (ref 30.0–36.0)
MCV: 82.8 fL (ref 78.0–100.0)
Platelets: 343 10*3/uL (ref 150–400)
RBC: 4.31 MIL/uL (ref 3.87–5.11)
RDW: 12.8 % (ref 11.5–15.5)
WBC: 5.4 10*3/uL (ref 4.0–10.5)

## 2017-04-17 LAB — I-STAT BETA HCG BLOOD, ED (MC, WL, AP ONLY): I-stat hCG, quantitative: <5 m[IU]/mL (ref <5)

## 2017-04-17 MED ORDER — SODIUM CHLORIDE 0.9 % IV BOLUS (SEPSIS)
1000.0000 mL | Freq: Once | INTRAVENOUS | Status: AC
  Administered 2017-04-17: 1000 mL via INTRAVENOUS

## 2017-04-17 MED ORDER — METFORMIN HCL 500 MG PO TABS: 500.0000 mg | ORAL_TABLET | Freq: Two times a day (BID) | ORAL | 0 refills | Status: DC

## 2017-04-17 MED ORDER — INSULIN NPH ISOPHANE & REGULAR (70-30) 100 UNIT/ML ~~LOC~~ SUSP: 20.0000 [IU] | Freq: Two times a day (BID) | SUBCUTANEOUS | 0 refills | Status: DC

## 2017-04-17 MED ORDER — INSULIN ASPART 100 UNIT/ML ~~LOC~~ SOLN
3.0000 [IU] | Freq: Once | SUBCUTANEOUS | Status: AC
  Administered 2017-04-17: 3 [IU] via SUBCUTANEOUS
  Filled 2017-04-17: qty 1

## 2017-04-17 NOTE — ED Notes (Signed)
Discharge instructions reviewed with patient. Patient verbalizes understanding. VSS. Patient provided with bus pas and walked to bus stop by [facility] off duty Garment/textile technologist. Patient instructed on bus routes to get on to get to [facility]. Patient to follow up with [facility] for medical care.

## 2017-04-17 NOTE — ED Notes (Signed)
Critical LAB : Glucose 585,  RN Mandy made aware.

## 2017-04-17 NOTE — Discharge Instructions (Signed)
Your blood sugar has come down in the ED.  Do not have any signs of DKA.  You need to continue taking her insulin as directed.  Have given you a prescription.  It is very important that you take the metformin.  You might not think that this helps her blood sugar but it does help along with the insulin.  Drink plenty of fluids.  Follow-up on Monday.  Return to the ED with any worsening symptoms.

## 2017-04-17 NOTE — Care Management Note (Signed)
Case Management Note  CM consulted for medication issues and possible PCP needs.  CM spoke with pt who states her insulin went bad because she couldn't refrigerate it.  When asked why she stated she is currently homeless and has recently come back to the area.  CM asked pt if she knew or had heard of the [facility] and she stated she stayed there last night due to the cold.  CM advised her to speak with Marliss Coots, NP on Monday and she could help her with her medications and become her doctor.  CM also advised her that she could stay the night there again tonight.  CM contacted Audrea Muscat to advise her of pt and who stated she would see her on Monday.  Updated Dorothea Ogle PA and primary RN.  No further CM needs noted at this time.

## 2017-04-17 NOTE — ED Triage Notes (Signed)
Per [facility] patient comes from [facility] for hyperglycemia. Patient checked sugar at 900am reading in 400s, [facility] got reading high on their meter.. Patient reports that she is out of insulin and wont take Metformin because she states it wont work.

## 2017-04-17 NOTE — ED Provider Notes (Signed)
[facility] DEPT Provider Note   CSN: [id number] Arrival date & time: 04/17/17  1135     History   Chief Complaint Chief Complaint  Patient presents with  . Hyperglycemia    HPI [name] is a 42 y.o. female.  HPI 42 year old African-American female past medical history significant for microcephaly, hypertension, pelvic mass, diabetes, GERD, hypercholesterolemia presents to the emergency department today for evaluation of hyperglycemia.  The patient states that she has been out of her insulin for the past month.  She states that she is homeless and has been staying in the [facility].  States that she has not had a refrigerator and her "insulin went bad".  Patient is also supposed be taking metformin however she states that this does not work and she is not taking it.  Patient came by EMS from [facility] for hyperglycemia.  Patient states that her blood sugars have been running in the 300s over the past month.  She takes her blood sugars regularly.  Patient does not have a primary care doctor in the area as she recently moved from [place].  Patient states that she is homeless.  Patient denies any other associated symptoms at this time.  Pt denies any fever, chill, ha, vision changes, lightheadedness, dizziness, congestion, neck pain, cp, sob, cough, abd pain, n/v/d, urinary symptoms, change in bowel habits, melena, hematochezia, lower extremity paresthesias.  Past Medical History:  Diagnosis Date  . Diabetes mellitus without complication ([place])   . Ectrodactyly   . GERD (gastroesophageal reflux disease)   . Hypercholesterolemia   . Hypertension   . Microcephaly ([place])   . Pelvic mass   . Ventral hernia     Patient Active Problem List   Diagnosis Date Noted  . Incarcerated ventral hernia 02/10/2017  . DM (diabetes mellitus) type 2, uncontrolled, with ketoacidosis ([place]) 02/10/2017  . Protein-calorie malnutrition, severe ([place]) 02/10/2017  . H/O pelvic  mass   . DKA (diabetic ketoacidoses) ([place]) 02/09/2017  . Hyponatremia 02/09/2017  . Hyperkalemia 02/09/2017  . Homelessness 02/09/2017  . Giant ovarian mass     Past Surgical History:  Procedure Laterality Date  . CHOLECYSTECTOMY    . HAND SURGERY Right     OB History    No data available       Home Medications    Prior to Admission medications   Medication Sig Start Date End Date Taking? Authorizing Provider  docusate sodium (COLACE) 100 MG capsule Take 100 mg by mouth daily as needed for mild constipation.    Yes [provider]  ibuprofen (ADVIL,MOTRIN) 600 MG tablet Take 600 mg by mouth every 6 (six) hours as needed for headache or mild pain.    Yes [provider]  insulin glargine (LANTUS) 100 UNIT/ML injection Inject 0.1 mLs (10 Units total) daily into the skin. 02/11/17  Yes Oswald Hillock, MD  senna (SENOKOT) 8.6 MG TABS tablet Take 1 tablet by mouth daily as needed for mild constipation.    Yes [provider]  acetaminophen (TYLENOL) 325 MG tablet Take 650 mg by mouth every 6 (six) hours as needed.    [provider]  insulin NPH-regular Human (NOVOLIN 70/30) (70-30) 100 UNIT/ML injection Inject 20-36 Units into the skin 2 (two) times daily. INJECT 36 UNITS UNDER THE SKIN EVERY MORNING AND 20 UNITS UNDER THE SKIN EVERY EVENING 04/17/17   Ocie Cornfield T, PA-C  metFORMIN (GLUCOPHAGE) 500 MG tablet Take 1 tablet (500 mg total) by mouth 2 (two)  times daily with a meal. 04/17/17   Tajai Ihde, Chrissie Noa T, PA-C  ondansetron (ZOFRAN) 4 MG tablet Take 1 tablet (4 mg total) by mouth every 6 (six) hours. Patient not taking: Reported on 04/17/2017 02/07/17   Nat Christen, MD    Family History No family history on file.  Social History Social History   Tobacco Use  . Smoking status: Never Smoker  . Smokeless tobacco: Never Used  Substance Use Topics  . Alcohol use: No  . Drug use: No     Allergies   Patient has no known  allergies.   Review of Systems Review of Systems  Constitutional: Negative for chills and fever.  HENT: Negative for congestion.   Eyes: Negative for visual disturbance.  Respiratory: Negative for cough and shortness of breath.   Cardiovascular: Negative for chest pain.  Gastrointestinal: Negative for abdominal pain, diarrhea, nausea and vomiting.  Genitourinary: Negative for dysuria, flank pain, frequency, hematuria, urgency, vaginal bleeding and vaginal discharge.  Musculoskeletal: Negative for arthralgias and myalgias.  Skin: Negative for rash.  Neurological: Negative for dizziness, syncope, weakness, light-headedness, numbness and headaches.  Psychiatric/Behavioral: Negative for sleep disturbance. The patient is not nervous/anxious.      Physical Exam Updated Vital Signs BP (!) 153/90 (BP Location: Right Arm)   Pulse 95   Temp 98.3 F (36.8 C) (Oral)   Resp 16   SpO2 100%   Physical Exam  Constitutional: She is oriented to person, place, and time. She appears well-developed and well-nourished.  Non-toxic appearance. No distress.  HENT:  Head: Normocephalic and atraumatic.  Nose: Nose normal.  Mouth/Throat: Oropharynx is clear and moist.  Eyes: Conjunctivae are normal. Pupils are equal, round, and reactive to light. Right eye exhibits no discharge. Left eye exhibits no discharge.  Neck: Normal range of motion. Neck supple.  Cardiovascular: Normal rate, regular rhythm, normal heart sounds and intact distal pulses. Exam reveals no gallop and no friction rub.  No murmur heard. Pulmonary/Chest: Effort normal and breath sounds normal. No stridor. No respiratory distress. She has no wheezes. She has no rales. She exhibits no tenderness.  Abdominal: Soft. Bowel sounds are normal. There is no tenderness. There is no rebound and no guarding.  Musculoskeletal: Normal range of motion. She exhibits no tenderness.   Left hand deformities and post-surgical changes.  Lymphadenopathy:     She has no cervical adenopathy.  Neurological: She is alert and oriented to person, place, and time.  Skin: Skin is warm and dry. Capillary refill takes less than 2 seconds.  Psychiatric: Her behavior is normal. Judgment and thought content normal.  Nursing note and vitals reviewed.    ED Treatments / Results  Labs (all labs ordered are listed, but only abnormal results are displayed) Labs Reviewed  BASIC METABOLIC PANEL - Abnormal; Notable for the following components:      Result Value   Sodium 125 (*)    Chloride 93 (*)    Glucose, Bld 585 (*)    Calcium 8.6 (*)    All other components within normal limits  CBC - Abnormal; Notable for the following components:   HCT 35.7 (*)    All other components within normal limits  URINALYSIS, ROUTINE W REFLEX MICROSCOPIC - Abnormal; Notable for the following components:   Color, Urine STRAW (*)    Glucose, UA >=500 (*)    Hgb urine dipstick LARGE (*)    Ketones, ur 5 (*)    Bacteria, UA RARE (*)    Squamous Epithelial /  LPF 0-5 (*)    All other components within normal limits  CBG MONITORING, ED - Abnormal; Notable for the following components:   Glucose-Capillary 580 (*)    All other components within normal limits  CBG MONITORING, ED - Abnormal; Notable for the following components:   Glucose-Capillary 331 (*)    All other components within normal limits  BLOOD GAS, VENOUS  I-STAT BETA HCG BLOOD, ED ([facility], [facility], [facility] ONLY)    EKG  EKG Interpretation None       Radiology No results found.  Procedures Procedures (including critical care time)  Medications Ordered in ED Medications  sodium chloride 0.9 % bolus 1,000 mL (0 mLs Intravenous Stopped 04/17/17 1613)  insulin aspart (novoLOG) injection 3 Units (3 Units Subcutaneous Given 04/17/17 1417)  sodium chloride 0.9 % bolus 1,000 mL (1,000 mLs Intravenous New Bag/Given 04/17/17 1614)     Initial Impression / Assessment and Plan / ED Course  I have reviewed the triage  vital signs and the nursing notes.  Pertinent labs & imaging results that were available during my care of the patient were reviewed by me and considered in my medical decision making (see chart for details).     Patient presents the ED for evaluation of hyperglycemia in the setting of not compliant with diabetes medications.  The patient states that she is homeless and has not been using her insulin for the past few weeks because she did not refrigerator.  She also states that she will not take her metformin because this does not "work".  She has no other complaints except for a high blood sugar.  She has been keeping a record and states that her blood sugars have been running in 3-400s.  Patient is overall well-appearing and nontoxic.  Vital signs are reassuring.  Patient is afebrile, no tachycardia, no hypotension is noted.  Exam is reassuring.  Patient's heart regular rate and rhythm.  Lungs clear to auscultation bilaterally.  Abdominal exam is benign without any focal tenderness or signs of peritonitis.  Lab work is reassuring.  No leukocytosis is noted.  Corrected sodium is at baseline for patient.  She is hyperglycemic with a blood sugar of 584 initially.  Patient was given 3 units of regular insulin with fluids that has brought her blood sugar down to 331.  UA without signs of infection with small amount of ketones noted.  Patient's is not acidotic.  PH is normal.  Normal bicarb.  No signs of DKA.  After reassessment patient continues to have any symptoms.  Vital signs remained reassuring.  Have consulted case management who has seen patient and she has follow-up Monday morning.  Patient does have Medicaid and will go get her prescriptions today.  Patient has tolerated p.o. fluid and food in the ED.  She is discharged and good condition.  Pt is hemodynamically stable, in NAD, & able to ambulate in the ED. Evaluation does not show pathology that would require ongoing emergent intervention or  inpatient treatment. I explained the diagnosis to the patient. Pain has been managed & has no complaints prior to dc. Pt is comfortable with above plan and is stable for discharge at this time. All questions were answered prior to disposition. Strict return precautions for f/u to the ED were discussed. Encouraged follow up with PCP.   Final Clinical Impressions(s) / ED Diagnoses   Final diagnoses:  Hyperglycemia    ED Discharge Orders        Ordered  insulin NPH-regular Human (NOVOLIN 70/30) (70-30) 100 UNIT/ML injection  2 times daily     04/17/17 1624    metFORMIN (GLUCOPHAGE) 500 MG tablet  2 times daily with meals     04/17/17 1624       Doristine Devoid, PA-C 04/17/17 1656    Fatima Blank, MD 04/18/17 1257

## 2017-04-29 ENCOUNTER — Encounter (HOSPITAL_BASED_OUTPATIENT_CLINIC_OR_DEPARTMENT_OTHER): Payer: Self-pay

## 2017-04-29 ENCOUNTER — Other Ambulatory Visit: Payer: Self-pay

## 2017-04-29 ENCOUNTER — Emergency Department (HOSPITAL_BASED_OUTPATIENT_CLINIC_OR_DEPARTMENT_OTHER)
Admission: EM | Admit: 2017-04-29 | Discharge: 2017-04-29 | Disposition: A | Discharge status: Home / Self Care | Payer: Medicaid Other | Attending: Physician Assistant | Admitting: Physician Assistant

## 2017-04-29 DIAGNOSIS — E1165 Type 2 diabetes mellitus with hyperglycemia: Secondary | ICD-10-CM | POA: Insufficient documentation

## 2017-04-29 DIAGNOSIS — Z794 Long term (current) use of insulin: Secondary | ICD-10-CM | POA: Insufficient documentation

## 2017-04-29 DIAGNOSIS — R739 Hyperglycemia, unspecified: Secondary | ICD-10-CM

## 2017-04-29 DIAGNOSIS — I10 Essential (primary) hypertension: Secondary | ICD-10-CM | POA: Insufficient documentation

## 2017-04-29 DIAGNOSIS — E1110 Type 2 diabetes mellitus with ketoacidosis without coma: Secondary | ICD-10-CM | POA: Diagnosis not present

## 2017-04-29 LAB — CBC WITH DIFFERENTIAL/PLATELET
Basophils Absolute: 0 10*3/uL (ref 0.0–0.1)
Basophils Relative: 0 %
Eosinophils Absolute: 0.1 10*3/uL (ref 0.0–0.7)
Eosinophils Relative: 2 %
HEMATOCRIT: 38.3 % (ref 36.0–46.0)
HEMOGLOBIN: 13.1 g/dL (ref 12.0–15.0)
LYMPHS ABS: 1.9 10*3/uL (ref 0.7–4.0)
LYMPHS PCT: 25 %
MCH: 28.4 pg (ref 26.0–34.0)
MCHC: 34.2 g/dL (ref 30.0–36.0)
MCV: 83.1 fL (ref 78.0–100.0)
Monocytes Absolute: 0.4 10*3/uL (ref 0.1–1.0)
Monocytes Relative: 6 %
NEUTROS ABS: 5.1 10*3/uL (ref 1.7–7.7)
Neutrophils Relative %: 67 %
Platelets: 352 10*3/uL (ref 150–400)
RBC: 4.61 MIL/uL (ref 3.87–5.11)
RDW: 12.8 % (ref 11.5–15.5)
WBC: 7.5 10*3/uL (ref 4.0–10.5)

## 2017-04-29 LAB — CBG MONITORING, ED
GLUCOSE-CAPILLARY: 504 mg/dL — AB (ref 65–99)
Glucose-Capillary: 474 mg/dL — ABNORMAL HIGH (ref 65–99)
Glucose-Capillary: 377 mg/dL — ABNORMAL HIGH (ref 65–99)

## 2017-04-29 LAB — COMPREHENSIVE METABOLIC PANEL
ALK PHOS: 96 U/L (ref 38–126)
ALT: 22 U/L (ref 14–54)
AST: 22 U/L (ref 15–41)
Albumin: 4 g/dL (ref 3.5–5.0)
Anion gap: 11 (ref 5–15)
BILIRUBIN TOTAL: 0.6 mg/dL (ref 0.3–1.2)
BUN: 25 mg/dL — AB (ref 6–20)
CALCIUM: 9.3 mg/dL (ref 8.9–10.3)
CHLORIDE: 92 mmol/L — AB (ref 101–111)
CO2: 25 mmol/L (ref 22–32)
CREATININE: 0.58 mg/dL (ref 0.44–1.00)
GFR calc Af Amer: >60 mL/min (ref >60)
Glucose, Bld: 521 mg/dL (ref 65–99)
Potassium: 4.4 mmol/L (ref 3.5–5.1)
Sodium: 128 mmol/L — ABNORMAL LOW (ref 135–145)
TOTAL PROTEIN: 8.8 g/dL — AB (ref 6.5–8.1)

## 2017-04-29 MED ORDER — SODIUM CHLORIDE 0.9 % IV BOLUS (SEPSIS)
1000.0000 mL | Freq: Once | INTRAVENOUS | Status: AC
  Administered 2017-04-29: 1000 mL via INTRAVENOUS

## 2017-04-29 MED ORDER — INSULIN GLARGINE 100 UNIT/ML ~~LOC~~ SOLN: 12.0000 [IU] | Freq: Every day | SUBCUTANEOUS | 0 refills | Status: DC

## 2017-04-29 MED ORDER — GLUCOSE BLOOD VI STRP: ORAL_STRIP | 0 refills | Status: DC

## 2017-04-29 MED ORDER — FREESTYLE SYSTEM KIT: 1.0000 | PACK | 0 refills | Status: DC | PRN

## 2017-04-29 MED ORDER — METFORMIN HCL 500 MG PO TABS: 500.0000 mg | ORAL_TABLET | Freq: Two times a day (BID) | ORAL | 0 refills | Status: DC

## 2017-04-29 MED ORDER — FREESTYLE LANCETS MISC: 0 refills | Status: DC

## 2017-04-29 NOTE — ED Provider Notes (Signed)
[facility] EMERGENCY DEPARTMENT Provider Note   CSN: [id number] Arrival date & time: 04/29/17  2024     History   Chief Complaint Chief Complaint  Patient presents with  . Hyperglycemia    HPI Johnice Riebe is a 42 y.o. female.  HPI   42 year old female with history of diabetes, ectrodactyly, hypertension hyperlipidemia microcephaly.  She is presenting today with no medications for her diabetes.  Went into the room patient said she would like refills on her prescriptions.  She reports that she has trouble getting to her doctor on her Medicaid card.  She has no complaints except for "my sugars are too high".   In looking in her chart looks like she recently had a social work consult in which she was given time with [facility] and they gave a prescription to take to [facility] but she never got them filled.  Past Medical History:  Diagnosis Date  . Diabetes mellitus without complication ([place])   . Ectrodactyly   . GERD (gastroesophageal reflux disease)   . Hypercholesterolemia   . Hypertension   . Microcephaly ([place])   . Pelvic mass   . Ventral hernia     Patient Active Problem List   Diagnosis Date Noted  . Incarcerated ventral hernia 02/10/2017  . DM (diabetes mellitus) type 2, uncontrolled, with ketoacidosis ([place]) 02/10/2017  . Protein-calorie malnutrition, severe ([place]) 02/10/2017  . H/O pelvic mass   . DKA (diabetic ketoacidoses) ([place]) 02/09/2017  . Hyponatremia 02/09/2017  . Hyperkalemia 02/09/2017  . Homelessness 02/09/2017  . Giant ovarian mass     Past Surgical History:  Procedure Laterality Date  . CHOLECYSTECTOMY    . HAND SURGERY Right     OB History    No data available       Home Medications    Prior to Admission medications   Medication Sig Start Date End Date Taking? Authorizing Provider  docusate sodium (COLACE) 100 MG capsule Take 100 mg by mouth daily as needed for mild constipation.     [provider]  ibuprofen  (ADVIL,MOTRIN) 600 MG tablet Take 600 mg by mouth every 6 (six) hours as needed for headache or mild pain.     [provider]  insulin glargine (LANTUS) 100 UNIT/ML injection Inject 0.1 mLs (10 Units total) daily into the skin. 02/11/17   Oswald Hillock, MD  insulin NPH-regular Human (NOVOLIN 70/30) (70-30) 100 UNIT/ML injection Inject 20-36 Units into the skin 2 (two) times daily. INJECT 36 UNITS UNDER THE SKIN EVERY MORNING AND 20 UNITS UNDER THE SKIN EVERY EVENING 04/17/17   Ocie Cornfield T, PA-C  metFORMIN (GLUCOPHAGE) 500 MG tablet Take 1 tablet (500 mg total) by mouth 2 (two) times daily with a meal. 04/17/17   Leaphart, Zack Seal, PA-C  ondansetron (ZOFRAN) 4 MG tablet Take 1 tablet (4 mg total) by mouth every 6 (six) hours. Patient not taking: Reported on 04/17/2017 02/07/17   Nat Christen, MD  senna (SENOKOT) 8.6 MG TABS tablet Take 1 tablet by mouth daily as needed for mild constipation.     [provider]    Family History No family history on file.  Social History Social History   Tobacco Use  . Smoking status: Never Smoker  . Smokeless tobacco: Never Used  Substance Use Topics  . Alcohol use: No  . Drug use: No     Allergies   Patient has no known allergies.   Review of Systems Review of Systems  Constitutional: Negative  for activity change.  HENT: Negative for congestion.   Respiratory: Negative for shortness of breath.   Cardiovascular: Negative for chest pain.  Gastrointestinal: Negative for abdominal pain.  All other systems reviewed and are negative.    Physical Exam Updated Vital Signs BP 134/78 (BP Location: Left Arm)   Pulse (!) 102   Temp 99.1 F (37.3 C) (Oral)   Resp 18   Ht 4\' 9"  (1.448 m)   Wt 40.9 kg (90 lb 2.7 oz)   LMP 04/15/2017   SpO2 100%   BMI 19.51 kg/m   Physical Exam  Constitutional: She is oriented to person, place, and time. She appears well-developed and well-nourished.  HENT:  Head: Normocephalic and  atraumatic.  Eyes: Conjunctivae are normal. Right eye exhibits no discharge. Left eye exhibits no discharge.  Neck: Neck supple.  Cardiovascular: Normal rate, regular rhythm and normal heart sounds.  No murmur heard. Pulmonary/Chest: Effort normal and breath sounds normal. She has no wheezes. She has no rales.  Abdominal: Soft. She exhibits no distension. There is no tenderness.  Scars to midline.  Musculoskeletal: Normal range of motion. She exhibits no edema.  Ectrodactyly.  Neurological: She is oriented to person, place, and time. No cranial nerve deficit.  Skin: Skin is warm and dry. No rash noted. She is not diaphoretic.  Psychiatric:  Odd affect.  Nursing note and vitals reviewed.    ED Treatments / Results  Labs (all labs ordered are listed, but only abnormal results are displayed) Labs Reviewed  COMPREHENSIVE METABOLIC PANEL - Abnormal; Notable for the following components:      Result Value   Sodium 128 (*)    Chloride 92 (*)    Glucose, Bld 521 (*)    BUN 25 (*)    Total Protein 8.8 (*)    All other components within normal limits  CBG MONITORING, ED - Abnormal; Notable for the following components:   Glucose-Capillary 504 (*)    All other components within normal limits  CBC WITH DIFFERENTIAL/PLATELET    EKG  EKG Interpretation None       Radiology No results found.  Procedures Procedures (including critical care time)  Medications Ordered in ED Medications  sodium chloride 0.9 % bolus 1,000 mL (1,000 mLs Intravenous New Bag/Given 04/29/17 2110)     Initial Impression / Assessment and Plan / ED Course  I have reviewed the triage vital signs and the nursing notes.  Pertinent labs & imaging results that were available during my care of the patient were reviewed by me and considered in my medical decision making (see chart for details).     42 year old female with history of diabetes, ectrodactyly, hypertension hyperlipidemia microcephaly.  She is  presenting today with no medications for her diabetes.  Went into the room patient said she would like refills on her prescriptions.  She reports that she has trouble getting to her doctor on her Medicaid card.  She has no complaints except for "my sugars are too high".   In looking in her chart looks like she recently had a social work consult in which she was given time with [facility] and they gave a prescription to take to [facility] but she never got them filled.  9:54 PM Will give patient prescription for monitor, test strips.  Patient requesting prescription for insulin and metformin.  Since patient seem to have a lot of barriers to getting her prescriptions, will give her short-term prescriptions until she can follow-up with primary care.  Patient's her sugars currently under 500, readable by home machine so I feel comfortable with discharge and  patient can use her insulin at home.  11:13 PM Patient was prescribed all of her diabetes medications on November 30 at [facility].  Then again 4 days ago by a provider here in the emergency department.  We will give her a smaller prescription here.  Unclear why patient is having such difficulty getting prescriptions filled or what is happening.  Patient already had recent SW work consult this week. Did not appear to follow plan. Will encourage her to do so. Patient a/o X 3 and appears able to make her own decision.   Final Clinical Impressions(s) / ED Diagnoses   Final diagnoses:  None    ED Discharge Orders    None       Macarthur Critchley, MD 04/29/17 2314

## 2017-04-29 NOTE — ED Triage Notes (Addendum)
Pt c/o elevated BS "for a long time"-states "I ain't got no insulin or pills"-NAD-steady gait-pt entered triage talking on phone-had to be asked x 3 to end call for triage-pt has a large soda and a bag of fast-was advised to not eat/drink while waiting to be seen

## 2017-04-29 NOTE — ED Notes (Signed)
Pt states was given a RX from the [facility] 2wks ago and her cousin took it to the [facility] and they couldn't fill it. States unknown reason but they still have the RX.

## 2017-04-29 NOTE — ED Notes (Signed)
Date and time results received: 04/29/17 2157   Test: glucose Critical Value: 521  Name of Provider Notified: Dr. Thomasene Lot  Orders Received? Or Actions Taken?: awaiting orders

## 2017-04-29 NOTE — Discharge Instructions (Addendum)
We have refilled your prescriptions.  It is important that you actually go get them filled.  We have noticed that you have recieved prescription several times not been able to fill in the past.  Recommend that you discuss with social work who you recently discussed a plan with you in order to help you get your perscriitiopns, follow up with a primary doctor.

## 2017-04-30 ENCOUNTER — Telehealth (HOSPITAL_BASED_OUTPATIENT_CLINIC_OR_DEPARTMENT_OTHER): Payer: Self-pay | Admitting: *Deleted

## 2017-05-10 ENCOUNTER — Emergency Department (HOSPITAL_COMMUNITY)
Admission: EM | Admit: 2017-05-10 | Discharge: 2017-05-11 | Disposition: A | Discharge status: Home / Self Care | Payer: Medicaid Other | Source: Home / Self Care | Attending: Emergency Medicine | Admitting: Emergency Medicine

## 2017-05-10 ENCOUNTER — Encounter (HOSPITAL_COMMUNITY): Payer: Self-pay | Admitting: Emergency Medicine

## 2017-05-10 ENCOUNTER — Other Ambulatory Visit: Payer: Self-pay

## 2017-05-10 ENCOUNTER — Encounter (HOSPITAL_COMMUNITY): Payer: Self-pay

## 2017-05-10 ENCOUNTER — Emergency Department (HOSPITAL_COMMUNITY)
Admission: EM | Admit: 2017-05-10 | Discharge: 2017-05-10 | Disposition: A | Discharge status: Home / Self Care | Payer: Medicaid Other | Attending: Emergency Medicine | Admitting: Emergency Medicine

## 2017-05-10 DIAGNOSIS — Z79899 Other long term (current) drug therapy: Secondary | ICD-10-CM | POA: Diagnosis not present

## 2017-05-10 DIAGNOSIS — E1165 Type 2 diabetes mellitus with hyperglycemia: Secondary | ICD-10-CM | POA: Diagnosis present

## 2017-05-10 DIAGNOSIS — Z794 Long term (current) use of insulin: Secondary | ICD-10-CM | POA: Diagnosis not present

## 2017-05-10 DIAGNOSIS — I10 Essential (primary) hypertension: Secondary | ICD-10-CM | POA: Insufficient documentation

## 2017-05-10 DIAGNOSIS — R739 Hyperglycemia, unspecified: Secondary | ICD-10-CM

## 2017-05-10 DIAGNOSIS — N3001 Acute cystitis with hematuria: Secondary | ICD-10-CM | POA: Diagnosis not present

## 2017-05-10 LAB — BASIC METABOLIC PANEL
Anion gap: 8 (ref 5–15)
BUN: 23 mg/dL — ABNORMAL HIGH (ref 6–20)
CALCIUM: 8.8 mg/dL — AB (ref 8.9–10.3)
CHLORIDE: 101 mmol/L (ref 101–111)
CO2: 23 mmol/L (ref 22–32)
CREATININE: 0.53 mg/dL (ref 0.44–1.00)
GFR calc non Af Amer: >60 mL/min (ref >60)
Glucose, Bld: 330 mg/dL — ABNORMAL HIGH (ref 65–99)
Potassium: 4.2 mmol/L (ref 3.5–5.1)
SODIUM: 132 mmol/L — AB (ref 135–145)

## 2017-05-10 LAB — CBC
HCT: 34.3 % — ABNORMAL LOW (ref 36.0–46.0)
Hemoglobin: 11.8 g/dL — ABNORMAL LOW (ref 12.0–15.0)
MCH: 28.4 pg (ref 26.0–34.0)
MCHC: 34.4 g/dL (ref 30.0–36.0)
MCV: 82.5 fL (ref 78.0–100.0)
PLATELETS: 328 10*3/uL (ref 150–400)
RBC: 4.16 MIL/uL (ref 3.87–5.11)
RDW: 12.8 % (ref 11.5–15.5)
WBC: 6.5 10*3/uL (ref 4.0–10.5)

## 2017-05-10 LAB — URINALYSIS, ROUTINE W REFLEX MICROSCOPIC
BILIRUBIN URINE: NEGATIVE
Glucose, UA: >=500 mg/dL — AB
Ketones, ur: NEGATIVE mg/dL
NITRITE: NEGATIVE
PH: 5 (ref 5.0–8.0)
Protein, ur: 30 mg/dL — AB
SPECIFIC GRAVITY, URINE: 1.03 (ref 1.005–1.030)

## 2017-05-10 LAB — I-STAT BETA HCG BLOOD, ED (MC, WL, AP ONLY)

## 2017-05-10 LAB — CBC WITH DIFFERENTIAL/PLATELET
BASOS PCT: 0 %
Basophils Absolute: 0 10*3/uL (ref 0.0–0.1)
Eosinophils Absolute: 0.2 10*3/uL (ref 0.0–0.7)
Eosinophils Relative: 2 %
HEMATOCRIT: 35.7 % — AB (ref 36.0–46.0)
HEMOGLOBIN: 12.4 g/dL (ref 12.0–15.0)
LYMPHS ABS: 2.5 10*3/uL (ref 0.7–4.0)
LYMPHS PCT: 29 %
MCH: 28.3 pg (ref 26.0–34.0)
MCHC: 34.7 g/dL (ref 30.0–36.0)
MCV: 81.5 fL (ref 78.0–100.0)
MONO ABS: 0.6 10*3/uL (ref 0.1–1.0)
MONOS PCT: 7 %
NEUTROS ABS: 5.1 10*3/uL (ref 1.7–7.7)
NEUTROS PCT: 62 %
Platelets: 336 10*3/uL (ref 150–400)
RBC: 4.38 MIL/uL (ref 3.87–5.11)
RDW: 12.7 % (ref 11.5–15.5)
WBC: 8.4 10*3/uL (ref 4.0–10.5)

## 2017-05-10 LAB — CBG MONITORING, ED
Glucose-Capillary: 352 mg/dL — ABNORMAL HIGH (ref 65–99)
Glucose-Capillary: 571 mg/dL (ref 65–99)

## 2017-05-10 MED ORDER — SODIUM CHLORIDE 0.9 % IV BOLUS (SEPSIS)
1000.0000 mL | Freq: Once | INTRAVENOUS | Status: AC
  Administered 2017-05-10: 1000 mL via INTRAVENOUS

## 2017-05-10 MED ORDER — METFORMIN HCL 500 MG PO TABS: 500.0000 mg | ORAL_TABLET | Freq: Two times a day (BID) | ORAL | 0 refills | Status: DC

## 2017-05-10 MED ORDER — CEPHALEXIN 500 MG PO CAPS: 500.0000 mg | ORAL_CAPSULE | Freq: Three times a day (TID) | ORAL | 0 refills | Status: AC

## 2017-05-10 MED ORDER — CEPHALEXIN 500 MG PO CAPS
500.0000 mg | ORAL_CAPSULE | Freq: Once | ORAL | Status: AC
  Administered 2017-05-10: 500 mg via ORAL
  Filled 2017-05-10: qty 1

## 2017-05-10 MED ORDER — INSULIN ASPART 100 UNIT/ML ~~LOC~~ SOLN
8.0000 [IU] | Freq: Once | SUBCUTANEOUS | Status: AC
Start: 2017-05-10 — End: 2017-05-10
  Administered 2017-05-10: 8 [IU] via INTRAVENOUS
  Filled 2017-05-10: qty 1

## 2017-05-10 NOTE — ED Provider Notes (Signed)
[facility] DEPT Provider Note   CSN: [id number] Arrival date & time: 05/10/17  1926     History   Chief Complaint Chief Complaint  Patient presents with  . Hyperglycemia    HPI [name] is a 42 y.o. female.  Patient is a 42 year old female with past medical history of diabetes, hypertension, microcephaly, and ectrodactyly presenting for evaluation of elevated blood sugar.  She reports feeling somewhat dizzy earlier this afternoon.  She checked her blood sugar and it registered as high.  She denies any specific aches or pains.  She denies any fevers or chills.  She reports being compliant with her medications.   The history is provided by the patient.  Hyperglycemia  Blood sugar level PTA:  "high" Severity:  Moderate Onset quality:  Sudden Timing:  Constant Progression:  Unchanged Chronicity:  Recurrent Diabetes status:  Controlled with insulin Context: not change in medication and not noncompliance   Relieved by:  Nothing Ineffective treatments:  None tried Associated symptoms: weakness   Associated symptoms: no abdominal pain, no blurred vision, no dehydration and no shortness of breath     Past Medical History:  Diagnosis Date  . Diabetes mellitus without complication ([place])   . Ectrodactyly   . GERD (gastroesophageal reflux disease)   . Hypercholesterolemia   . Hypertension   . Microcephaly ([place])   . Pelvic mass   . Ventral hernia     Patient Active Problem List   Diagnosis Date Noted  . Incarcerated ventral hernia 02/10/2017  . DM (diabetes mellitus) type 2, uncontrolled, with ketoacidosis ([place]) 02/10/2017  . Protein-calorie malnutrition, severe ([place]) 02/10/2017  . H/O pelvic mass   . DKA (diabetic ketoacidoses) ([place]) 02/09/2017  . Hyponatremia 02/09/2017  . Hyperkalemia 02/09/2017  . Homelessness 02/09/2017  . Giant ovarian mass     Past Surgical History:  Procedure Laterality Date  . CHOLECYSTECTOMY    .  HAND SURGERY Right     OB History    No data available       Home Medications    Prior to Admission medications   Medication Sig Start Date End Date Taking? Authorizing Provider  cephALEXin (KEFLEX) 500 MG capsule Take 1 capsule (500 mg total) by mouth 3 (three) times daily for 7 days. 05/10/17 05/17/17  Fatima Blank, MD  docusate sodium (COLACE) 100 MG capsule Take 100 mg by mouth daily as needed for mild constipation.     [provider]  glucose blood (FREESTYLE TEST STRIPS) test strip Use as instructed 04/29/17   Mackuen, Courteney Lyn, MD  glucose monitoring kit (FREESTYLE) monitoring kit 1 each by Does not apply route as needed for other. 04/29/17   Mackuen, Courteney Lyn, MD  ibuprofen (ADVIL,MOTRIN) 600 MG tablet Take 600 mg by mouth every 6 (six) hours as needed for headache or mild pain.     [provider]  insulin glargine (LANTUS) 100 UNIT/ML injection Inject 0.1 mLs (10 Units total) daily into the skin. 02/11/17   Oswald Hillock, MD  insulin glargine (LANTUS) 100 UNIT/ML injection Inject 0.12 mLs (12 Units total) into the skin daily. 04/29/17   Mackuen, Courteney Lyn, MD  insulin NPH-regular Human (NOVOLIN 70/30) (70-30) 100 UNIT/ML injection Inject 20-36 Units into the skin 2 (two) times daily. INJECT 36 UNITS UNDER THE SKIN EVERY MORNING AND 20 UNITS UNDER THE SKIN EVERY EVENING 04/17/17   Ocie Cornfield T, PA-C  Lancets (FREESTYLE) lancets Use as instructed 04/29/17   Mackuen, Courteney Lyn,  MD  metFORMIN (GLUCOPHAGE) 500 MG tablet Take 1 tablet (500 mg total) by mouth 2 (two) times daily with a meal. 05/10/17   Cardama, Grayce Sessions, MD  ondansetron (ZOFRAN) 4 MG tablet Take 1 tablet (4 mg total) by mouth every 6 (six) hours. Patient not taking: Reported on 04/17/2017 02/07/17   Nat Christen, MD  senna (SENOKOT) 8.6 MG TABS tablet Take 1 tablet by mouth daily as needed for mild constipation.     [provider]    Family History History  reviewed. No pertinent family history.  Social History Social History   Tobacco Use  . Smoking status: Never Smoker  . Smokeless tobacco: Never Used  Substance Use Topics  . Alcohol use: No  . Drug use: No     Allergies   Patient has no known allergies.   Review of Systems Review of Systems  Eyes: Negative for blurred vision.  Respiratory: Negative for shortness of breath.   Gastrointestinal: Negative for abdominal pain.  Neurological: Positive for weakness.  All other systems reviewed and are negative.    Physical Exam Updated Vital Signs BP 121/77 (BP Location: Left Arm)   Pulse (!) 105   Temp 98.6 F (37 C) (Oral)   Resp 18   Ht '4\' 10"'$  (1.473 m)   Wt 40.8 kg (90 lb)   LMP 04/15/2017   SpO2 100%   BMI 18.81 kg/m   Physical Exam  Constitutional: She is oriented to person, place, and time. She appears well-developed and well-nourished. No distress.  HENT:  Head: Normocephalic and atraumatic.  Neck: Normal range of motion. Neck supple.  Cardiovascular: Normal rate and regular rhythm. Exam reveals no gallop and no friction rub.  No murmur heard. Pulmonary/Chest: Effort normal and breath sounds normal. No respiratory distress. She has no wheezes.  Abdominal: Soft. Bowel sounds are normal. She exhibits no distension. There is no tenderness.  Musculoskeletal: Normal range of motion.  Neurological: She is alert and oriented to person, place, and time.  Skin: Skin is warm and dry. She is not diaphoretic.  Nursing note and vitals reviewed.    ED Treatments / Results  Labs (all labs ordered are listed, but only abnormal results are displayed) Labs Reviewed  CBG MONITORING, ED - Abnormal; Notable for the following components:      Result Value   Glucose-Capillary 571 (*)    All other components within normal limits  BASIC METABOLIC PANEL  CBC WITH DIFFERENTIAL/PLATELET    EKG  EKG Interpretation None       Radiology No results  found.  Procedures Procedures (including critical care time)  Medications Ordered in ED Medications  sodium chloride 0.9 % bolus 1,000 mL (not administered)  insulin aspart (novoLOG) injection 8 Units (not administered)     Initial Impression / Assessment and Plan / ED Course  I have reviewed the triage vital signs and the nursing notes.  Pertinent labs & imaging results that were available during my care of the patient were reviewed by me and considered in my medical decision making (see chart for details).  Patient presents here with complaints of elevated blood sugar.  Her initial sugar was 500.  She was given IV fluids and insulin and an hour later her fingerstick was 297.  There is no sign of ketoacidosis or other electrolyte abnormality.  I see no indication for further workup.  She will be discharged, to keep a record of her blood sugars then follow-up with her primary doctor.  Final Clinical Impressions(s) / ED Diagnoses   Final diagnoses:  None    ED Discharge Orders    None       Veryl Speak, MD 05/11/17 [phone]

## 2017-05-10 NOTE — ED Notes (Signed)
ED Provider at bedside. 

## 2017-05-10 NOTE — ED Triage Notes (Signed)
States seen here earlier and now has not taken her insulin today and wants to get her blood sugar down before her ride will come to pick her up.

## 2017-05-10 NOTE — ED Triage Notes (Addendum)
Pt states she woke up to her CBG at 319 with no relief from meds. States she does not have enough insulin to make it to her next doctor's appt on the 13th. Denies N/V or urinary frequency.

## 2017-05-10 NOTE — ED Notes (Signed)
Per MD request, patient provided with 10 insulin syringes.

## 2017-05-10 NOTE — ED Notes (Signed)
Pt is eating in the lobby

## 2017-05-10 NOTE — ED Provider Notes (Signed)
[facility] DEPT Provider Note  CSN: [id number] Arrival date & time: 05/10/17 1111  Chief Complaint(s) Hyperglycemia  HPI [name] is a 42 y.o. female   The history is provided by the patient.  Hyperglycemia  Blood sugar level PTA:  340 - 500s Severity:  Moderate Onset quality:  Gradual Duration: several weeks. Timing: daily. Progression:  Waxing and waning Chronicity:  Recurrent Diabetes status:  Controlled with insulin and controlled with oral medications Current diabetic therapy:  Lantus and metformin Context: not change in medication and not noncompliance   Relieved by:  Nothing Ineffective treatments:  None tried Associated symptoms: polyuria   Associated symptoms: no abdominal pain, no chest pain, no confusion, no dehydration, no diaphoresis and no dysuria    She reports that she has been using meds as Rx. States that she ran out of her insulin syringes last night.  Past Medical History Past Medical History:  Diagnosis Date  . Diabetes mellitus without complication ([place])   . Ectrodactyly   . GERD (gastroesophageal reflux disease)   . Hypercholesterolemia   . Hypertension   . Microcephaly ([place])   . Pelvic mass   . Ventral hernia    Patient Active Problem List   Diagnosis Date Noted  . Incarcerated ventral hernia 02/10/2017  . DM (diabetes mellitus) type 2, uncontrolled, with ketoacidosis ([place]) 02/10/2017  . Protein-calorie malnutrition, severe ([place]) 02/10/2017  . H/O pelvic mass   . DKA (diabetic ketoacidoses) ([place]) 02/09/2017  . Hyponatremia 02/09/2017  . Hyperkalemia 02/09/2017  . Homelessness 02/09/2017  . Giant ovarian mass    Home Medication(s) Prior to Admission medications   Medication Sig Start Date End Date Taking? Authorizing Provider  cephALEXin (KEFLEX) 500 MG capsule Take 1 capsule (500 mg total) by mouth 3 (three) times daily for 7 days. 05/10/17 05/17/17  Fatima Blank, MD  docusate sodium  (COLACE) 100 MG capsule Take 100 mg by mouth daily as needed for mild constipation.     [provider]  glucose blood (FREESTYLE TEST STRIPS) test strip Use as instructed 04/29/17   Mackuen, Courteney Lyn, MD  glucose monitoring kit (FREESTYLE) monitoring kit 1 each by Does not apply route as needed for other. 04/29/17   Mackuen, Courteney Lyn, MD  ibuprofen (ADVIL,MOTRIN) 600 MG tablet Take 600 mg by mouth every 6 (six) hours as needed for headache or mild pain.     [provider]  insulin glargine (LANTUS) 100 UNIT/ML injection Inject 0.1 mLs (10 Units total) daily into the skin. 02/11/17   Oswald Hillock, MD  insulin glargine (LANTUS) 100 UNIT/ML injection Inject 0.12 mLs (12 Units total) into the skin daily. 04/29/17   Mackuen, Courteney Lyn, MD  insulin NPH-regular Human (NOVOLIN 70/30) (70-30) 100 UNIT/ML injection Inject 20-36 Units into the skin 2 (two) times daily. INJECT 36 UNITS UNDER THE SKIN EVERY MORNING AND 20 UNITS UNDER THE SKIN EVERY EVENING 04/17/17   Ocie Cornfield T, PA-C  Lancets (FREESTYLE) lancets Use as instructed 04/29/17   Mackuen, Courteney Lyn, MD  metFORMIN (GLUCOPHAGE) 500 MG tablet Take 1 tablet (500 mg total) by mouth 2 (two) times daily with a meal. 05/10/17   Tashaya Ancrum, Grayce Sessions, MD  ondansetron (ZOFRAN) 4 MG tablet Take 1 tablet (4 mg total) by mouth every 6 (six) hours. Patient not taking: Reported on 04/17/2017 02/07/17   Nat Christen, MD  senna (SENOKOT) 8.6 MG TABS tablet Take 1 tablet by mouth daily as needed for mild constipation.  [provider]                                                                                                                                    Past Surgical History Past Surgical History:  Procedure Laterality Date  . CHOLECYSTECTOMY    . HAND SURGERY Right    Family History History reviewed. No pertinent family history.  Social History Social History   Tobacco Use  . Smoking status: Never  Smoker  . Smokeless tobacco: Never Used  Substance Use Topics  . Alcohol use: No  . Drug use: No   Allergies Patient has no known allergies.  Review of Systems Review of Systems  Constitutional: Negative for diaphoresis.  Cardiovascular: Negative for chest pain.  Gastrointestinal: Negative for abdominal pain.  Endocrine: Positive for polyuria.  Genitourinary: Positive for frequency. Negative for dysuria.  Psychiatric/Behavioral: Negative for confusion.   All other systems are reviewed and are negative for acute change except as noted in the HPI  Physical Exam Vital Signs  I have reviewed the triage vital signs BP (!) 140/91 (BP Location: Left Arm)   Pulse 94   Temp 99.2 F (37.3 C)   Resp 17   Ht '4\' 9"'$  (1.448 m)   Wt 40.8 kg (90 lb)   LMP 04/15/2017   SpO2 100%   BMI 19.48 kg/m  Physical Exam  Constitutional: She is oriented to person, place, and time. She appears well-developed and well-nourished. No distress.  HENT:  Head: Normocephalic and atraumatic.  Nose: Nose normal.  Eyes: Conjunctivae and EOM are normal. Pupils are equal, round, and reactive to light. Right eye exhibits no discharge. Left eye exhibits no discharge. No scleral icterus.  Neck: Normal range of motion. Neck supple.  Cardiovascular: Normal rate and regular rhythm. Exam reveals no gallop and no friction rub.  No murmur heard. Pulmonary/Chest: Effort normal and breath sounds normal. No stridor. No respiratory distress. She has no rales.  Abdominal: Soft. She exhibits no distension. There is no tenderness.  Musculoskeletal: She exhibits no edema or tenderness.  ectrodactyly  Neurological: She is alert and oriented to person, place, and time.  Skin: Skin is warm and dry. No rash noted. She is not diaphoretic. No erythema.  Psychiatric: She has a normal mood and affect.  Vitals reviewed.   ED Results and Treatments Labs (all labs ordered are listed, but only abnormal results are displayed) Labs  Reviewed  BASIC METABOLIC PANEL - Abnormal; Notable for the following components:      Result Value   Sodium 132 (*)    Glucose, Bld 330 (*)    BUN 23 (*)    Calcium 8.8 (*)    All other components within normal limits  CBC - Abnormal; Notable for the following components:   Hemoglobin 11.8 (*)    HCT 34.3 (*)    All other components within normal limits  URINALYSIS, ROUTINE W  REFLEX MICROSCOPIC - Abnormal; Notable for the following components:   APPearance CLOUDY (*)    Glucose, UA >=500 (*)    Hgb urine dipstick SMALL (*)    Protein, ur 30 (*)    Leukocytes, UA LARGE (*)    Bacteria, UA MANY (*)    Squamous Epithelial / LPF 6-30 (*)    All other components within normal limits  CBG MONITORING, ED - Abnormal; Notable for the following components:   Glucose-Capillary 352 (*)    All other components within normal limits  I-STAT BETA HCG BLOOD, ED ([facility], [facility], [facility] ONLY)                                                                                                                         EKG  EKG Interpretation  Date/Time:    Ventricular Rate:    PR Interval:    QRS Duration:   QT Interval:    QTC Calculation:   R Axis:     Text Interpretation:        Radiology No results found. Pertinent labs & imaging results that were available during my care of the patient were reviewed by me and considered in my medical decision making (see chart for details).  Medications Ordered in ED Medications  cephALEXin (KEFLEX) capsule 500 mg (500 mg Oral Given 05/10/17 1513)                                                                                                                                    Procedures Procedures  (including critical care time)  Medical Decision Making / ED Course I have reviewed the nursing notes for this encounter and the patient's prior records (if available in EHR or on provided paperwork).    Hyperglycemia w/o DKA or HHS. UA contaminated, but concerning  for infection given her reported frequency. Will treat with keflex.   She was provided with syringes from here.   The patient appears reasonably screened and/or stabilized for discharge and I doubt any other medical condition or other [facility] requiring further screening, evaluation, or treatment in the ED at this time prior to discharge.  The patient is safe for discharge with strict return precautions.   Final Clinical Impression(s) / ED Diagnoses Final diagnoses:  Hyperglycemia  Acute cystitis with hematuria    Disposition: Discharge  Condition: Good  I have discussed the results, Dx and  Tx plan with the patient who expressed understanding and agree(s) with the plan. Discharge instructions discussed at great length. The patient was given strict return precautions who verbalized understanding of the instructions. No further questions at time of discharge.    ED Discharge Orders        Ordered    cephALEXin (KEFLEX) 500 MG capsule  3 times daily     05/10/17 1517    metFORMIN (GLUCOPHAGE) 500 MG tablet  2 times daily with meals     05/10/17 1517       Follow Up: primary care provider  Schedule an appointment as soon as possible for a visit  in 5-7 days     This chart was dictated using voice recognition software.  Despite best efforts to proofread,  errors can occur which can change the documentation meaning.   Fatima Blank, MD 05/10/17 [phone]

## 2017-05-11 LAB — BASIC METABOLIC PANEL
ANION GAP: 9 (ref 5–15)
BUN: 19 mg/dL (ref 6–20)
CHLORIDE: 96 mmol/L — AB (ref 101–111)
CO2: 24 mmol/L (ref 22–32)
Calcium: 8.9 mg/dL (ref 8.9–10.3)
Creatinine, Ser: 0.53 mg/dL (ref 0.44–1.00)
GFR calc non Af Amer: >60 mL/min (ref >60)
GLUCOSE: 480 mg/dL — AB (ref 65–99)
POTASSIUM: 4.2 mmol/L (ref 3.5–5.1)
Sodium: 129 mmol/L — ABNORMAL LOW (ref 135–145)

## 2017-05-11 LAB — CBG MONITORING, ED: Glucose-Capillary: 297 mg/dL — ABNORMAL HIGH (ref 65–99)

## 2017-05-11 NOTE — Discharge Instructions (Signed)
Continue your medications as before.  Follow-up with your primary doctor later this week.  Keep a record of your blood sugars between now and then for your physician to review.

## 2017-06-26 ENCOUNTER — Encounter (HOSPITAL_COMMUNITY): Payer: Self-pay

## 2017-06-26 ENCOUNTER — Emergency Department (HOSPITAL_COMMUNITY)
Admission: EM | Admit: 2017-06-26 | Discharge: 2017-06-26 | Disposition: A | Discharge status: Home / Self Care | Payer: Medicaid Other | Attending: Emergency Medicine | Admitting: Emergency Medicine

## 2017-06-26 DIAGNOSIS — R12 Heartburn: Secondary | ICD-10-CM

## 2017-06-26 DIAGNOSIS — Z794 Long term (current) use of insulin: Secondary | ICD-10-CM | POA: Insufficient documentation

## 2017-06-26 DIAGNOSIS — E119 Type 2 diabetes mellitus without complications: Secondary | ICD-10-CM | POA: Insufficient documentation

## 2017-06-26 DIAGNOSIS — Z79899 Other long term (current) drug therapy: Secondary | ICD-10-CM | POA: Insufficient documentation

## 2017-06-26 DIAGNOSIS — I10 Essential (primary) hypertension: Secondary | ICD-10-CM | POA: Diagnosis not present

## 2017-06-26 DIAGNOSIS — R1013 Epigastric pain: Secondary | ICD-10-CM

## 2017-06-26 LAB — COMPREHENSIVE METABOLIC PANEL
ALK PHOS: 72 U/L (ref 38–126)
ALT: 14 U/L (ref 14–54)
AST: 19 U/L (ref 15–41)
Albumin: 3.7 g/dL (ref 3.5–5.0)
Anion gap: 12 (ref 5–15)
BILIRUBIN TOTAL: 0.9 mg/dL (ref 0.3–1.2)
BUN: 17 mg/dL (ref 6–20)
CALCIUM: 9.2 mg/dL (ref 8.9–10.3)
CO2: 23 mmol/L (ref 22–32)
Chloride: 98 mmol/L — ABNORMAL LOW (ref 101–111)
Creatinine, Ser: 0.55 mg/dL (ref 0.44–1.00)
GFR calc Af Amer: >60 mL/min (ref >60)
GFR calc non Af Amer: >60 mL/min (ref >60)
GLUCOSE: 320 mg/dL — AB (ref 65–99)
Potassium: 4.1 mmol/L (ref 3.5–5.1)
Sodium: 133 mmol/L — ABNORMAL LOW (ref 135–145)
TOTAL PROTEIN: 7.8 g/dL (ref 6.5–8.1)

## 2017-06-26 LAB — URINALYSIS, ROUTINE W REFLEX MICROSCOPIC
Bilirubin Urine: NEGATIVE
Glucose, UA: >=500 mg/dL — AB
Ketones, ur: 5 mg/dL — AB
Leukocytes, UA: NEGATIVE
Nitrite: NEGATIVE
Protein, ur: 100 mg/dL — AB
SPECIFIC GRAVITY, URINE: 1.022 (ref 1.005–1.030)
pH: 5 (ref 5.0–8.0)

## 2017-06-26 LAB — CBC
HEMATOCRIT: 37.5 % (ref 36.0–46.0)
Hemoglobin: 12.8 g/dL (ref 12.0–15.0)
MCH: 28.8 pg (ref 26.0–34.0)
MCHC: 34.1 g/dL (ref 30.0–36.0)
MCV: 84.5 fL (ref 78.0–100.0)
Platelets: 328 10*3/uL (ref 150–400)
RBC: 4.44 MIL/uL (ref 3.87–5.11)
RDW: 12.4 % (ref 11.5–15.5)
WBC: 6.4 10*3/uL (ref 4.0–10.5)

## 2017-06-26 LAB — I-STAT BETA HCG BLOOD, ED (MC, WL, AP ONLY)

## 2017-06-26 LAB — LIPASE, BLOOD: Lipase: 25 U/L (ref 11–51)

## 2017-06-26 MED ORDER — INSULIN GLARGINE 100 UNIT/ML ~~LOC~~ SOLN: 12.0000 [IU] | Freq: Every day | SUBCUTANEOUS | 0 refills | Status: DC

## 2017-06-26 MED ORDER — ONDANSETRON HCL 4 MG/2ML IJ SOLN
4.0000 mg | Freq: Once | INTRAMUSCULAR | Status: AC
  Administered 2017-06-26: 4 mg via INTRAVENOUS
  Filled 2017-06-26: qty 2

## 2017-06-26 MED ORDER — METFORMIN HCL 500 MG PO TABS
500.0000 mg | ORAL_TABLET | Freq: Once | ORAL | Status: AC
  Administered 2017-06-26: 500 mg via ORAL
  Filled 2017-06-26: qty 1

## 2017-06-26 MED ORDER — OMEPRAZOLE 20 MG PO CPDR: 20.0000 mg | DELAYED_RELEASE_CAPSULE | Freq: Every day | ORAL | 2 refills | Status: AC

## 2017-06-26 MED ORDER — GI COCKTAIL ~~LOC~~
30.0000 mL | Freq: Once | ORAL | Status: AC
Start: 2017-06-26 — End: 2017-06-26
  Administered 2017-06-26: 30 mL via ORAL
  Filled 2017-06-26: qty 30

## 2017-06-26 MED ORDER — OMEPRAZOLE 2 MG/ML ORAL SUSPENSION: 20.0000 mg | Freq: Every day | ORAL | 1 refills | Status: DC

## 2017-06-26 MED ORDER — SODIUM CHLORIDE 0.9 % IV BOLUS (SEPSIS)
1000.0000 mL | Freq: Once | INTRAVENOUS | Status: AC
  Administered 2017-06-26: 1000 mL via INTRAVENOUS

## 2017-06-26 NOTE — ED Provider Notes (Signed)
[facility] DEPT Provider Note   CSN: [id number] Arrival date & time: 06/26/17  1157     History   Chief Complaint Chief Complaint  Patient presents with  . Abdominal Pain    HPI [name] is a 42 y.o. female with past medical history significant for diabetes, GERD, hypertension presenting with epigastric pain and what she describes as her acid reflux acting up with burning sensation of her throat and vomiting.  She reports that she has not been compliant with previously prescribed medications for reflux. She has been taking Rolaids with some relief.  Her symptoms are aggravated by lying flat.  She started experiencing the reflux when she lies down for bed last night and woke up this morning with vomiting and came in to be evaluated.  Denies chest pain, shortness of breath, cough, fever, chills or other symptoms.  Patient has not taken her insulin or metformin today.  She explains that she is starting to run low on her insulin and has been sparing it by taking smaller doses prescribed. She states that she was unable to obtain her prescription to get more insulin. She currently injects 12 units lantus daily. She denies smoking or alcohol use.  HPI  Past Medical History:  Diagnosis Date  . Diabetes mellitus without complication ([place])   . Ectrodactyly   . GERD (gastroesophageal reflux disease)   . Hypercholesterolemia   . Hypertension   . Microcephaly ([place])   . Pelvic mass   . Ventral hernia     Patient Active Problem List   Diagnosis Date Noted  . Incarcerated ventral hernia 02/10/2017  . DM (diabetes mellitus) type 2, uncontrolled, with ketoacidosis ([place]) 02/10/2017  . Protein-calorie malnutrition, severe ([place]) 02/10/2017  . H/O pelvic mass   . DKA (diabetic ketoacidoses) ([place]) 02/09/2017  . Hyponatremia 02/09/2017  . Hyperkalemia 02/09/2017  . Homelessness 02/09/2017  . Giant ovarian mass     Past Surgical History:  Procedure  Laterality Date  . CHOLECYSTECTOMY    . HAND SURGERY Right      OB History   None      Home Medications    Prior to Admission medications   Medication Sig Start Date End Date Taking? Authorizing Provider  metFORMIN (GLUCOPHAGE) 500 MG tablet Take 1 tablet (500 mg total) by mouth 2 (two) times daily with a meal. 05/10/17  Yes Cardama, Grayce Sessions, MD  docusate sodium (COLACE) 100 MG capsule Take 100 mg by mouth daily as needed for mild constipation.     [provider]  glucose blood (FREESTYLE TEST STRIPS) test strip Use as instructed 04/29/17   Mackuen, Courteney Lyn, MD  glucose monitoring kit (FREESTYLE) monitoring kit 1 each by Does not apply route as needed for other. 04/29/17   Mackuen, Courteney Lyn, MD  ibuprofen (ADVIL,MOTRIN) 600 MG tablet Take 600 mg by mouth every 6 (six) hours as needed for headache or mild pain.     [provider]  insulin glargine (LANTUS) 100 UNIT/ML injection Inject 0.12 mLs (12 Units total) into the skin daily. 06/26/17   Avie Echevaria B, PA-C  insulin NPH-regular Human (NOVOLIN 70/30) (70-30) 100 UNIT/ML injection Inject 20-36 Units into the skin 2 (two) times daily. INJECT 36 UNITS UNDER THE SKIN EVERY MORNING AND 20 UNITS UNDER THE SKIN EVERY EVENING Patient not taking: Reported on 05/11/2017 04/17/17   Ocie Cornfield T, PA-C  Lancets (FREESTYLE) lancets Use as instructed 04/29/17   Mackuen, Fredia Sorrow, MD  omeprazole ([place])  20 MG capsule Take 1 capsule (20 mg total) by mouth daily. 06/26/17   Avie Echevaria B, PA-C  ondansetron (ZOFRAN) 4 MG tablet Take 1 tablet (4 mg total) by mouth every 6 (six) hours. Patient not taking: Reported on 04/17/2017 02/07/17   Nat Christen, MD  senna (SENOKOT) 8.6 MG TABS tablet Take 1 tablet by mouth daily as needed for mild constipation.     [provider]    Family History Family History  Problem Relation Age of Onset  . Diabetes Mother   . GER disease Mother   . Diabetes  Father     Social History Social History   Tobacco Use  . Smoking status: Never Smoker  . Smokeless tobacco: Never Used  Substance Use Topics  . Alcohol use: No  . Drug use: No     Allergies   Patient has no known allergies.   Review of Systems Review of Systems  Constitutional: Negative for chills, diaphoresis, fatigue and fever.  HENT: Negative for congestion, ear pain and sore throat.   Eyes: Negative for visual disturbance.  Respiratory: Negative for cough, choking, chest tightness, shortness of breath, wheezing and stridor.   Cardiovascular: Negative for chest pain, palpitations and leg swelling.  Gastrointestinal: Positive for abdominal pain, nausea and vomiting. Negative for abdominal distention, blood in stool, constipation and diarrhea.       Burning sensation in her throat and reflux.  Nonradiating epigastric pain.  Genitourinary: Negative for difficulty urinating, dysuria, flank pain, frequency and hematuria.  Musculoskeletal: Negative for arthralgias, neck pain and neck stiffness.  Skin: Negative for color change, pallor and rash.  Neurological: Negative for dizziness, tremors, weakness, light-headedness, numbness and headaches.  Psychiatric/Behavioral: Negative for behavioral problems.     Physical Exam Updated Vital Signs BP 136/82 (BP Location: Left Arm)   Pulse 100   Temp 97.9 F (36.6 C) (Oral)   Resp 14   Ht '4\' 9"'$  (1.448 m)   Wt 40.8 kg (90 lb)   LMP 06/19/2017 (Approximate)   SpO2 100%   BMI 19.48 kg/m   Physical Exam  Constitutional: She appears well-developed and well-nourished.  Non-toxic appearance. She does not appear ill. No distress.  Afebrile, nontoxic-appearing, sitting comfortably in bed no acute distress.  Patient is slightly tachycardic on arrival.  HENT:  Head: Normocephalic and atraumatic.  Eyes: Conjunctivae are normal.  Neck: Neck supple.  Cardiovascular: Normal rate and regular rhythm.  No murmur heard. Pulmonary/Chest:  Effort normal and breath sounds normal. No stridor. No respiratory distress. She has no wheezes. She has no rhonchi. She has no rales. She exhibits no tenderness.  Abdominal: Soft. Normal appearance. She exhibits no distension and no mass. There is tenderness in the epigastric area. There is no rigidity, no rebound, no guarding, no tenderness at McBurney's point and negative Murphy's sign.  Musculoskeletal: She exhibits no edema.  Neurological: She is alert.  Skin: Skin is warm and dry. No rash noted. She is not diaphoretic. No cyanosis or erythema. No pallor.  Psychiatric: She has a normal mood and affect.  Nursing note and vitals reviewed.    ED Treatments / Results  Labs (all labs ordered are listed, but only abnormal results are displayed) Labs Reviewed  COMPREHENSIVE METABOLIC PANEL - Abnormal; Notable for the following components:      Result Value   Sodium 133 (*)    Chloride 98 (*)    Glucose, Bld 320 (*)    All other components within normal limits  URINALYSIS,  ROUTINE W REFLEX MICROSCOPIC - Abnormal; Notable for the following components:   APPearance HAZY (*)    Glucose, UA >=500 (*)    Hgb urine dipstick SMALL (*)    Ketones, ur 5 (*)    Protein, ur 100 (*)    Bacteria, UA RARE (*)    Squamous Epithelial / LPF 6-30 (*)    All other components within normal limits  LIPASE, BLOOD  CBC  I-STAT BETA HCG BLOOD, ED ([facility], [facility], [facility] ONLY)    EKG EKG Interpretation  Date/Time:  Friday June 26 2017 17:23:25 EDT Ventricular Rate:  96 PR Interval:    QRS Duration: 80 QT Interval:  345 QTC Calculation: 436 R Axis:   49 Text Interpretation:  Sinus rhythm Borderline short PR interval When compared to prior, no significant changes seen.  No STEMI Confirmed by Antony Blackbird [phone]) on 06/26/2017 7:43:45 PM   Radiology No results found.  Procedures Procedures (including critical care time)  Medications Ordered in ED Medications  sodium chloride 0.9 % bolus 1,000 mL (1,000  mLs Intravenous New Bag/Given 06/26/17 1748)  ondansetron (ZOFRAN) injection 4 mg (4 mg Intravenous Given 06/26/17 1748)  gi cocktail (Maalox,Lidocaine,Donnatal) (30 mLs Oral Given 06/26/17 1748)  metFORMIN (GLUCOPHAGE) tablet 500 mg (500 mg Oral Given 06/26/17 1902)     Initial Impression / Assessment and Plan / ED Course  I have reviewed the triage vital signs and the nursing notes.  Pertinent labs & imaging results that were available during my care of the patient were reviewed by me and considered in my medical decision making (see chart for details).    Patient presented with epigastric discomfort, heartburn and regurgitation.  Known history of GERD and has not been taking any PPIs.  She was given a GI cocktail and significantly improved.  Patient's blood glucose was in the 300s and patient has been noncompliant with her insulin regiment.  No anion gap or clinical evidence of dehydration or DKA. She was given IV fluids and metformin while in the emergency department.  Patient was urged to maintain follow-up with her PCP to ensure that she refills her medications.  She is otherwise well-appearing nontoxic afebrile.  Will discharge home with PPI, refill prescription, and close follow-up with GI and PCP.  Discussed strict return precautions and advised to return to the emergency department if experiencing any new or worsening symptoms. Instructions were understood and patient agreed with discharge plan.  Final Clinical Impressions(s) / ED Diagnoses   Final diagnoses:  Epigastric pain  Heartburn    ED Discharge Orders        Ordered    insulin glargine (LANTUS) 100 UNIT/ML injection  Daily     06/26/17 1842    omeprazole (PRILOSEC) 20 MG capsule  Daily     06/26/17 1842       Dossie Der 06/26/17 2016    Tegeler, Gwenyth Allegra, MD 06/26/17 [phone]

## 2017-06-26 NOTE — Discharge Instructions (Signed)
As discussed, take omeprazole every morning. It will take a few days to start working. You may take tums or rolaids to help with acid reflux for immediate relief in the meantime. Follow up with [facility] and follow up with your primary care provider to make sure that you are taking all your medications as prescribed.

## 2017-06-26 NOTE — ED Triage Notes (Signed)
Per EMS-states she is having symptoms of acid reflux-started yesterday morning-nausea this am no vomiting-CBG 307-has'nt taken am meds

## 2017-06-26 NOTE — ED Notes (Signed)
Unsuccessful IV attempt x2. Charge RN asked to attempt.  

## 2017-06-26 NOTE — ED Notes (Signed)
ED Provider at bedside. 

## 2017-06-26 NOTE — ED Notes (Signed)
Unsuccessful IV x2 by Merrily Brittle.

## 2018-03-09 ENCOUNTER — Other Ambulatory Visit: Payer: Self-pay

## 2018-03-09 ENCOUNTER — Encounter (HOSPITAL_COMMUNITY): Payer: Self-pay | Admitting: Emergency Medicine

## 2018-03-09 ENCOUNTER — Ambulatory Visit (INDEPENDENT_AMBULATORY_CARE_PROVIDER_SITE_OTHER): Payer: Medicaid Other | Admitting: Podiatry

## 2018-03-09 ENCOUNTER — Emergency Department (HOSPITAL_COMMUNITY)
Admission: EM | Admit: 2018-03-09 | Discharge: 2018-03-09 | Disposition: A | Discharge status: Home / Self Care | Payer: Medicaid Other | Attending: Emergency Medicine | Admitting: Emergency Medicine

## 2018-03-09 ENCOUNTER — Encounter: Payer: Self-pay | Admitting: Podiatry

## 2018-03-09 VITALS — BP 132/97

## 2018-03-09 DIAGNOSIS — B351 Tinea unguium: Secondary | ICD-10-CM | POA: Diagnosis not present

## 2018-03-09 DIAGNOSIS — E119 Type 2 diabetes mellitus without complications: Secondary | ICD-10-CM | POA: Insufficient documentation

## 2018-03-09 DIAGNOSIS — M79675 Pain in left toe(s): Secondary | ICD-10-CM

## 2018-03-09 DIAGNOSIS — L600 Ingrowing nail: Secondary | ICD-10-CM | POA: Diagnosis not present

## 2018-03-09 DIAGNOSIS — I10 Essential (primary) hypertension: Secondary | ICD-10-CM | POA: Diagnosis not present

## 2018-03-09 DIAGNOSIS — E0865 Diabetes mellitus due to underlying condition with hyperglycemia: Secondary | ICD-10-CM

## 2018-03-09 DIAGNOSIS — M79674 Pain in right toe(s): Secondary | ICD-10-CM

## 2018-03-09 DIAGNOSIS — Z79899 Other long term (current) drug therapy: Secondary | ICD-10-CM | POA: Insufficient documentation

## 2018-03-09 DIAGNOSIS — E7800 Pure hypercholesterolemia, unspecified: Secondary | ICD-10-CM | POA: Insufficient documentation

## 2018-03-09 DIAGNOSIS — Z794 Long term (current) use of insulin: Secondary | ICD-10-CM | POA: Diagnosis not present

## 2018-03-09 DIAGNOSIS — F79 Unspecified intellectual disabilities: Secondary | ICD-10-CM | POA: Insufficient documentation

## 2018-03-09 LAB — CBG MONITORING, ED: Glucose-Capillary: 144 mg/dL — ABNORMAL HIGH (ref 70–99)

## 2018-03-09 NOTE — Patient Instructions (Signed)

## 2018-03-09 NOTE — ED Provider Notes (Addendum)
[facility] EMERGENCY DEPARTMENT Provider Note   CSN: [id number] Arrival date & time: 03/09/18  1126     History   Chief Complaint Chief Complaint  Patient presents with  . Wound Check    HPI Elvie Palomo is a 42 y.o. female.   Wound Check  This is a chronic problem. The current episode started more than 1 week ago. The problem occurs constantly. The problem has not changed since onset.Nothing aggravates the symptoms. Nothing relieves the symptoms. She has tried nothing for the symptoms. The treatment provided no relief.    Past Medical History:  Diagnosis Date  . Diabetes mellitus without complication ([place])   . Ectrodactyly   . GERD (gastroesophageal reflux disease)   . Hypercholesterolemia   . Hypertension   . Microcephaly ([place])   . Pelvic mass   . Ventral hernia     Patient Active Problem List   Diagnosis Date Noted  . Incarcerated ventral hernia 02/10/2017  . DM (diabetes mellitus) type 2, uncontrolled, with ketoacidosis ([place]) 02/10/2017  . Protein-calorie malnutrition, severe ([place]) 02/10/2017  . H/O pelvic mass   . DKA (diabetic ketoacidoses) ([place]) 02/09/2017  . Hyponatremia 02/09/2017  . Hyperkalemia 02/09/2017  . Homelessness 02/09/2017  . Giant ovarian mass     Past Surgical History:  Procedure Laterality Date  . CHOLECYSTECTOMY    . HAND SURGERY Right      OB History   None      Home Medications    Prior to Admission medications   Medication Sig Start Date End Date Taking? Authorizing Provider  docusate sodium (COLACE) 100 MG capsule Take 100 mg by mouth daily as needed for mild constipation.     [provider]  glucose blood (FREESTYLE TEST STRIPS) test strip Use as instructed 04/29/17   Mackuen, Courteney Lyn, MD  glucose monitoring kit (FREESTYLE) monitoring kit 1 each by Does not apply route as needed for other. 04/29/17   Mackuen, Courteney Lyn, MD  ibuprofen (ADVIL,MOTRIN) 600 MG tablet Take 600 mg by  mouth every 6 (six) hours as needed for headache or mild pain.     [provider]  insulin glargine (LANTUS) 100 UNIT/ML injection Inject 0.12 mLs (12 Units total) into the skin daily. 06/26/17   Avie Echevaria B, PA-C  insulin NPH-regular Human (NOVOLIN 70/30) (70-30) 100 UNIT/ML injection Inject 20-36 Units into the skin 2 (two) times daily. INJECT 36 UNITS UNDER THE SKIN EVERY MORNING AND 20 UNITS UNDER THE SKIN EVERY EVENING Patient not taking: Reported on 05/11/2017 04/17/17   Ocie Cornfield T, PA-C  Lancets (FREESTYLE) lancets Use as instructed 04/29/17   Mackuen, Courteney Lyn, MD  metFORMIN (GLUCOPHAGE) 500 MG tablet Take 1 tablet (500 mg total) by mouth 2 (two) times daily with a meal. 05/10/17   Cardama, Grayce Sessions, MD  omeprazole (PRILOSEC) 2 mg/mL SUSP Take 10 mLs (20 mg total) by mouth daily. 06/26/17   Emeline General, PA-C  omeprazole (PRILOSEC) 20 MG capsule Take 1 capsule (20 mg total) by mouth daily. 06/26/17   Avie Echevaria B, PA-C  ondansetron (ZOFRAN) 4 MG tablet Take 1 tablet (4 mg total) by mouth every 6 (six) hours. Patient not taking: Reported on 04/17/2017 02/07/17   Nat Christen, MD  senna (SENOKOT) 8.6 MG TABS tablet Take 1 tablet by mouth daily as needed for mild constipation.     [provider]    Family History Family History  Problem Relation Age of Onset  . Diabetes  Mother   . GER disease Mother   . Diabetes Father     Social History Social History   Tobacco Use  . Smoking status: Never Smoker  . Smokeless tobacco: Never Used  Substance Use Topics  . Alcohol use: No  . Drug use: No     Allergies   Patient has no known allergies.   Review of Systems Review of Systems  Constitutional: Negative for diaphoresis and fever.  Endocrine: Negative for polydipsia, polyphagia and polyuria.  Skin: Negative for color change, pallor, rash and wound.       Ingrown toe nail of left second toe, chronic     Physical Exam Updated  Vital Signs  ED Triage Vitals  Enc Vitals Group     BP 03/09/18 1143 131/73     Pulse Rate 03/09/18 1143 85     Resp 03/09/18 1143 16     Temp 03/09/18 1143 98.7 F (37.1 C)     Temp Source 03/09/18 1143 Oral     SpO2 03/09/18 1143 100 %     Weight 03/09/18 1145 100 lb (45.4 kg)     Height 03/09/18 1145 '4\' 9"'$  (1.448 m)     Head Circumference --      Peak Flow --      Pain Score 03/09/18 1145 0     Pain Loc --      Pain Edu? --      Excl. in [place]? --     Physical Exam  Constitutional: She appears well-developed and well-nourished. No distress.  HENT:  Head: Normocephalic and atraumatic.  Eyes: Conjunctivae are normal.  Neck: Neck supple.  Pulmonary/Chest: Effort normal and breath sounds normal. No respiratory distress.  Abdominal: Soft. There is no tenderness.  Musculoskeletal: Normal range of motion. She exhibits no edema or tenderness.  Neurological: She is alert.  Skin: Skin is warm and dry.  Left second digit with chronic changes, ingrown nail no signs of acute infection  Psychiatric: She has a normal mood and affect.  Nursing note and vitals reviewed.    ED Treatments / Results  Labs (all labs ordered are listed, but only abnormal results are displayed) Labs Reviewed  CBG MONITORING, ED - Abnormal; Notable for the following components:      Result Value   Glucose-Capillary 144 (*)    All other components within normal limits    EKG None  Radiology No results found.  Procedures Procedures (including critical care time)  Medications Ordered in ED Medications - No data to display   Initial Impression / Assessment and Plan / ED Course  I have reviewed the triage vital signs and the nursing notes.  Pertinent labs & imaging results that were available during my care of the patient were reviewed by me and considered in my medical decision making (see chart for details).     Deira Shimer is a 41 year old female with history of diabetes who presents  to the ED with left toe issue.  Patient with normal vitals.  No fever.  Patient with chronic appearing left toenail issue. Patient states has looked like this for months. Appears to be overgrown.  Patient has appointment with podiatry this afternoon but was sent from her facility for evaluation.  No concern for acute infection.  Recommend that she follow-up with podiatry for further care.  Discharged from ED in good condition.  This chart was dictated using voice recognition software.  Despite best efforts to proofread,  errors can occur which  can change the documentation meaning.   Final Clinical Impressions(s) / ED Diagnoses   Final diagnoses:  Ingrown nail    ED Discharge Orders    None       Lennice Sites, DO 03/09/18 [place], [place], DO 03/09/18 1220

## 2018-03-09 NOTE — ED Triage Notes (Signed)
Pt here for evaluation of black left 2nd toenail. No open wounds noted. Pulse, sensation intact. Denies recent fevers. Toenails appear overgrown. Pt has podiatry appt today at 130pm.

## 2018-04-05 ENCOUNTER — Encounter: Payer: Self-pay | Admitting: Podiatry

## 2018-04-05 NOTE — Progress Notes (Signed)
Subjective: [name] presents today referred by [facility] with diabetes and cc of painful, discolored, thick toenails which interfere with daily activities. Most symptomatic is her left 2nd digit toenail with is growing into her skin.  She states she cannot cut the toenail due to shape/thickness. She is also afraid because she is diabetic. Pain is aggravated when wearing enclosed shoe gear. She did seek treatment at [facility] ED for symptoms and was referred to our clinic.   Past Medical History:  Diagnosis Date  . Diabetes mellitus without complication ([place])   . Ectrodactyly   . GERD (gastroesophageal reflux disease)   . Hypercholesterolemia   . Hypertension   . Microcephaly ([place])   . Pelvic mass   . Ventral hernia     Patient Active Problem List   Diagnosis Date Noted  . Intellectual disability 03/09/2018  . Microcephaly ([place]) 02/19/2017  . 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 Incarcerated ventral hernia 02/10/2017  . DM (diabetes mellitus) type 2, uncontrolled, with ketoacidosis ([place]) 02/10/2017  . Protein-calorie malnutrition, severe ([place]) 02/10/2017  . H/O pelvic mass   . DKA (diabetic ketoacidoses) ([place]) 02/09/2017  . Hyponatremia 02/09/2017  . Hyperkalemia 02/09/2017  . Homelessness 02/09/2017  . Giant ovarian mass     Past Surgical History:  Procedure Laterality Date  . CHOLECYSTECTOMY    . HAND SURGERY Right      Current Outpatient Medications:  .  docusate sodium (COLACE) 100 MG capsule, Take 100 mg by mouth daily as needed for mild constipation. , Disp: , Rfl:  .  EASY COMFORT PEN NEEDLES 31G X 5 MM MISC, USE TWICE A DAY WITH INSULIN PEN, Disp: , Rfl: 3 .  glucose blood (FREESTYLE TEST STRIPS) test strip, Use as instructed, Disp: 100 each, Rfl: 0 .  glucose monitoring kit (FREESTYLE) monitoring kit, 1 each by Does not apply route as needed for other., Disp: 1 each, Rfl: 0 .  ibuprofen (ADVIL,MOTRIN) 600 MG tablet, Take 600 mg by mouth every 6  (six) hours as needed for headache or mild pain. , Disp: , Rfl:  .  insulin glargine (LANTUS) 100 UNIT/ML injection, Inject 0.12 mLs (12 Units total) into the skin daily., Disp: 10 mL, Rfl: 0 .  JANUMET XR [id number] MG TB24, Take 1 tablet by mouth daily., Disp: , Rfl: 3 .  Lancets (FREESTYLE) lancets, Use as instructed, Disp: 100 each, Rfl: 0 .  metFORMIN (GLUCOPHAGE) 500 MG tablet, Take 1 tablet (500 mg total) by mouth 2 (two) times daily with a meal., Disp: 30 tablet, Rfl: 0 .  omeprazole (PRILOSEC) 2 mg/mL SUSP, Take 10 mLs (20 mg total) by mouth daily., Disp: 350 mL, Rfl: 1 .  omeprazole (PRILOSEC) 20 MG capsule, Take 1 capsule (20 mg total) by mouth daily., Disp: 30 capsule, Rfl: 2 .  omeprazole (PRILOSEC) 40 MG capsule, Take 40 mg by mouth daily., Disp: , Rfl: 3 .  insulin NPH-regular Human (NOVOLIN 70/30) (70-30) 100 UNIT/ML injection, Inject 20-36 Units into the skin 2 (two) times daily. INJECT 36 UNITS UNDER THE SKIN EVERY MORNING AND 20 UNITS UNDER THE SKIN EVERY EVENING (Patient not taking: Reported on 05/11/2017), Disp: 10 mL, Rfl: 0 .  ondansetron (ZOFRAN) 4 MG tablet, Take 1 tablet (4 mg total) by mouth every 6 (six) hours. (Patient not taking: Reported on 04/17/2017), Disp: 10 tablet, Rfl: 0 .  senna (SENOKOT) 8.6 MG TABS tablet, Take 1 tablet by mouth daily as needed for mild constipation. , Disp: ,  Rfl:   No Known Allergies  Social History   Occupational History  . Not on file  Tobacco Use  . Smoking status: Never Smoker  . Smokeless tobacco: Never Used  Substance and Sexual Activity  . Alcohol use: No  . Drug use: No  . Sexual activity: Not on file    Family History  Problem Relation Age of Onset  . Diabetes Mother   . GER disease Mother   . Diabetes Father     Immunization History  Administered Date(s) Administered  . Influenza,inj,Quad PF,6+ Mos 02/10/2017  . Pneumococcal Polysaccharide-23 02/10/2017     Review of systems: Positive Findings in bold  print.  Constitutional:  chills, fatigue, fever, sweats, weight change Communication: Optometrist, sign Ecologist, hand writing, iPad/Android device Eyes: diplopia, glare,  light sensitivity, eyeglasses, blindness Ears nose mouth throat: Hard of hearing, deaf, sign language,  vertigo,   bloody nose,  rhinitis,  cold sores, snoring Cardiovascular: HTN, edema, arrhythmia, pacemaker in place, defibrillator in place,  chest pain/tightness, chronic anticoagulation, blood clot Respiratory:  difficulty breathing, denies congestion, SOB, wheezing, cough Gastrointestinal: abdominal pain, diarrhea, nausea, vomiting,  Genitourinary:  nocturia,  pain on urination,  blood in urine, Foley catheter, urinary urgency Musculoskeletal: Uses mobility aid,  cramping, stiff joints, painful joints,  Skin: +changes in toenails, color change dryness, itchy skin, mole changes, or rash  Neurological: numbness, paresthesias, burning in feet, denies fainting,  seizure, change in speech. denies headaches, memory problems/poor historian, cerebral palsy Endocrine: diabetes, hypothyroidism, hyperthyroidism,  dry mouth, flushing, denies heat intolerance,  cold intolerance,  excessive thirst, denies polyuria,  nocturia Hematological:  easy bleeding,  excessive bleeding, easy bruising, enlarged lymph nodes, on long term blood thinner Allergy/immunological:  hive, frequent infections, multiple drug allergies, seasonal allergies,  Psychiatric:  anxiety, depression, mood disorder, suicidal ideations, hallucinations   Objective: Vascular Examination: Capillary refill time immediate x 10 digits Dorsalis pedis and posterior tibial pulses present b/l No digital hair x 10 digits Skin temperature gradient WNL b/l  Dermatological Examination: Skin with normal turgor, texture and tone b/l  Toenails 1-5 b/l discolored, thick, dystrophic with subungual debris and pain with palpation to nailbeds due to thickness of nails. Left  2nd digit with Ram's horn nail noted to be growing into her skin. No break in skin. No erythema, no edema, no underlying abscess. +tenderness to palpable due to pressure from nail plate.  Musculoskeletal: Muscle strength 5/5 to all LE muscle groups  Neurological: Sensation intact with 10 gram monofilament Vibratory sensation intact.  Labs: last HgA1c 13.7, November, 2018  Assessment: 1. Painful onychomycosis toenails 1-5 b/l  2. NIDDM, uncontrolled (last HgA1c 13.7, November, 2018)  Plan: 1. Discussed diabetic foot care principles. Literature dispensed on today. 2. Toenails 1-5 b/l were debrided in length and girth without iatrogenic bleeding. 3. Patient to continue soft, supportive shoe gear 4. Patient to report any pedal injuries to medical professional immediately. 5. Follow up 3 months. Patient/POA to call should there be a concern in the interim.

## 2018-06-08 ENCOUNTER — Ambulatory Visit (INDEPENDENT_AMBULATORY_CARE_PROVIDER_SITE_OTHER): Payer: Medicaid Other | Admitting: Podiatry

## 2018-06-08 DIAGNOSIS — B351 Tinea unguium: Secondary | ICD-10-CM | POA: Diagnosis not present

## 2018-06-08 DIAGNOSIS — M79674 Pain in right toe(s): Secondary | ICD-10-CM

## 2018-06-08 DIAGNOSIS — M79675 Pain in left toe(s): Secondary | ICD-10-CM | POA: Diagnosis not present

## 2018-06-08 DIAGNOSIS — E0865 Diabetes mellitus due to underlying condition with hyperglycemia: Secondary | ICD-10-CM

## 2018-06-08 NOTE — Patient Instructions (Signed)

## 2018-06-20 ENCOUNTER — Encounter: Payer: Self-pay | Admitting: Podiatry

## 2018-06-20 NOTE — Progress Notes (Signed)
Subjective: Patient presents today with diabetes and cc of painful, discolored, thick toenails which interfere with daily activities. Pain is aggravated when wearing enclosed shoe gear. Pain is getting progressively worse and relieved with periodic professional debridement.   Current Outpatient Medications:  .  docusate sodium (COLACE) 100 MG capsule, Take 100 mg by mouth daily as needed for mild constipation. , Disp: , Rfl:  .  EASY COMFORT PEN NEEDLES 31G X 5 MM MISC, USE TWICE A DAY WITH INSULIN PEN, Disp: , Rfl: 3 .  glucose blood (FREESTYLE TEST STRIPS) test strip, Use as instructed, Disp: 100 each, Rfl: 0 .  glucose monitoring kit (FREESTYLE) monitoring kit, 1 each by Does not apply route as needed for other., Disp: 1 each, Rfl: 0 .  ibuprofen (ADVIL,MOTRIN) 600 MG tablet, Take 600 mg by mouth every 6 (six) hours as needed for headache or mild pain. , Disp: , Rfl:  .  insulin glargine (LANTUS) 100 UNIT/ML injection, Inject 0.12 mLs (12 Units total) into the skin daily., Disp: 10 mL, Rfl: 0 .  insulin NPH-regular Human (NOVOLIN 70/30) (70-30) 100 UNIT/ML injection, Inject 20-36 Units into the skin 2 (two) times daily. INJECT 36 UNITS UNDER THE SKIN EVERY MORNING AND 20 UNITS UNDER THE SKIN EVERY EVENING (Patient not taking: Reported on 05/11/2017), Disp: 10 mL, Rfl: 0 .  JANUMET XR [phone] MG TB24, Take 1 tablet by mouth daily., Disp: , Rfl: 3 .  Lancets (FREESTYLE) lancets, Use as instructed, Disp: 100 each, Rfl: 0 .  metFORMIN (GLUCOPHAGE) 500 MG tablet, Take 1 tablet (500 mg total) by mouth 2 (two) times daily with a meal., Disp: 30 tablet, Rfl: 0 .  omeprazole (PRILOSEC) 2 mg/mL SUSP, Take 10 mLs (20 mg total) by mouth daily., Disp: 350 mL, Rfl: 1 .  omeprazole (PRILOSEC) 20 MG capsule, Take 1 capsule (20 mg total) by mouth daily., Disp: 30 capsule, Rfl: 2 .  omeprazole (PRILOSEC) 40 MG capsule, Take 40 mg by mouth daily., Disp: , Rfl: 3 .  ondansetron (ZOFRAN) 4 MG tablet, Take 1 tablet (4  mg total) by mouth every 6 (six) hours. (Patient not taking: Reported on 04/17/2017), Disp: 10 tablet, Rfl: 0 .  senna (SENOKOT) 8.6 MG TABS tablet, Take 1 tablet by mouth daily as needed for mild constipation. , Disp: , Rfl:    No Known Allergies   Objective:  Vascular Examination: Capillary refill time immediate x 10 digits  Dorsalis pedis pulses palpable b/l  Posterior tibial pulses palpable  B/l  No digital hair x 10 digits  Skin temperature WNL b/l  Dermatological Examination: Skin with normal turgor, texture and tone b/l.  Toenails 1-5 b/l discolored, thick, dystrophic with subungual debris and pain with palpation to nailbeds due to thickness of nails.  Musculoskeletal: Muscle strength 5/5 to all LE muscle groups  Neurological: Sensation intact with 10 gram monofilament.  Vibratory sensation intact.  Assessment: 1. Painful onychomycosis toenails 1-5 b/l 2. NIDDM, uncontrolled  Plan: 1. Continue diabetic foot care principles. Literature dispensed. 2. Toenails 1-5 b/l were debrided in length and girth without iatrogenic bleeding. 3. Patient to continue soft, supportive shoe gear 4. Patient to report any pedal injuries to medical professional  5. Follow up 3 months.  6. Patient/POA to call should there be a concern in the interim.

## 2018-08-05 NOTE — Progress Notes (Signed)
COVID Hotel Screening performed. Temperature, PHQ-9, and need for medical care and medications assessed. No additional needs assessed at this time.  Dickie Cloe RN MSN 

## 2018-08-09 NOTE — Progress Notes (Signed)
Closing encounter per request. 

## 2018-08-12 NOTE — Progress Notes (Signed)
[place] Screening performed. Temperature, PHQ-9, and need for medical care and medications assessed. Patient is on Medicaid, but sates she is unable to obtain her Lantus due to the cost. Referral to [place] RN MSN

## 2018-08-19 NOTE — Progress Notes (Signed)
[place] Screening performed. Temperature, PHQ-9, and need for medical care and medications assessed. Patient reports that she has days when she cannot walk long distances because her legs hurt. They are not hurting today.She is scheduled to follow up with her provider and is going to call to talk with them. No additional needs assessed at this time.  Arnold Long RN MSN

## 2018-08-25 NOTE — Progress Notes (Signed)
COVID Hotel Screening performed. Temperature, PHQ-9, and need for medical care and medications assessed. No additional needs assessed at this time.  Bekah Igoe RN MSN 

## 2018-09-01 NOTE — Progress Notes (Signed)
COVID Hotel Screening performed. Temperature, PHQ-9, and need for medical care and medications assessed. No additional needs assessed at this time.  Anica Alcaraz RN MSN 

## 2018-09-07 ENCOUNTER — Other Ambulatory Visit: Payer: Self-pay

## 2018-09-07 ENCOUNTER — Ambulatory Visit: Payer: Medicaid Other | Admitting: Podiatry

## 2018-09-07 ENCOUNTER — Encounter: Payer: Self-pay | Admitting: Podiatry

## 2018-09-07 VITALS — Temp 98.1°F

## 2018-09-07 DIAGNOSIS — M79675 Pain in left toe(s): Secondary | ICD-10-CM

## 2018-09-07 DIAGNOSIS — B351 Tinea unguium: Secondary | ICD-10-CM

## 2018-09-07 DIAGNOSIS — E0865 Diabetes mellitus due to underlying condition with hyperglycemia: Secondary | ICD-10-CM

## 2018-09-07 DIAGNOSIS — M79674 Pain in right toe(s): Secondary | ICD-10-CM

## 2018-09-07 NOTE — Patient Instructions (Signed)
Diabetes Mellitus and Foot Care  Foot care is an important part of your health, especially when you have diabetes. Diabetes may cause you to have problems because of poor blood flow (circulation) to your feet and legs, which can cause your skin to:   Become thinner and drier.   Break more easily.   Heal more slowly.   Peel and crack.  You may also have nerve damage (neuropathy) in your legs and feet, causing decreased feeling in them. This means that you may not notice minor injuries to your feet that could lead to more serious problems. Noticing and addressing any potential problems early is the best way to prevent future foot problems.  How to care for your feet  Foot hygiene   Wash your feet daily with warm water and mild soap. Do not use hot water. Then, pat your feet and the areas between your toes until they are completely dry. Do not soak your feet as this can dry your skin.   Trim your toenails straight across. Do not dig under them or around the cuticle. File the edges of your nails with an emery board or nail file.   Apply a moisturizing lotion or petroleum jelly to the skin on your feet and to dry, brittle toenails. Use lotion that does not contain alcohol and is unscented. Do not apply lotion between your toes.  Shoes and socks   Wear clean socks or stockings every day. Make sure they are not too tight. Do not wear knee-high stockings since they may decrease blood flow to your legs.   Wear shoes that fit properly and have enough cushioning. Always look in your shoes before you put them on to be sure there are no objects inside.   To break in new shoes, wear them for just a few hours a day. This prevents injuries on your feet.  Wounds, scrapes, corns, and calluses   Check your feet daily for blisters, cuts, bruises, sores, and redness. If you cannot see the bottom of your feet, use a mirror or ask someone for help.   Do not cut corns or calluses or try to remove them with medicine.   If you  find a minor scrape, cut, or break in the skin on your feet, keep it and the skin around it clean and dry. You may clean these areas with mild soap and water. Do not clean the area with peroxide, alcohol, or iodine.   If you have a wound, scrape, corn, or callus on your foot, look at it several times a day to make sure it is healing and not infected. Check for:  ? Redness, swelling, or pain.  ? Fluid or blood.  ? Warmth.  ? Pus or a bad smell.  General instructions   Do not cross your legs. This may decrease blood flow to your feet.   Do not use heating pads or hot water bottles on your feet. They may burn your skin. If you have lost feeling in your feet or legs, you may not know this is happening until it is too late.   Protect your feet from hot and cold by wearing shoes, such as at the beach or on hot pavement.   Schedule a complete foot exam at least once a year (annually) or more often if you have foot problems. If you have foot problems, report any cuts, sores, or bruises to your health care provider immediately.  Contact a health care provider if:     You have a medical condition that increases your risk of infection and you have any cuts, sores, or bruises on your feet.   You have an injury that is not healing.   You have redness on your legs or feet.   You feel burning or tingling in your legs or feet.   You have pain or cramps in your legs and feet.   Your legs or feet are numb.   Your feet always feel cold.   You have pain around a toenail.  Get help right away if:   You have a wound, scrape, corn, or callus on your foot and:  ? You have pain, swelling, or redness that gets worse.  ? You have fluid or blood coming from the wound, scrape, corn, or callus.  ? Your wound, scrape, corn, or callus feels warm to the touch.  ? You have pus or a bad smell coming from the wound, scrape, corn, or callus.  ? You have a fever.  ? You have a red line going up your leg.  Summary   Check your feet every day  for cuts, sores, red spots, swelling, and blisters.   Moisturize feet and legs daily.   Wear shoes that fit properly and have enough cushioning.   If you have foot problems, report any cuts, sores, or bruises to your health care provider immediately.   Schedule a complete foot exam at least once a year (annually) or more often if you have foot problems.  This information is not intended to replace advice given to you by your health care provider. Make sure you discuss any questions you have with your health care provider.  Document Released: 03/21/2000 Document Revised: 05/06/2017 Document Reviewed: 04/25/2016  Elsevier Interactive Patient Education  2019 [employer]

## 2018-09-08 ENCOUNTER — Other Ambulatory Visit: Payer: Self-pay | Admitting: Hematology

## 2018-09-08 DIAGNOSIS — Z20822 Contact with and (suspected) exposure to covid-19: Secondary | ICD-10-CM

## 2018-09-08 NOTE — Progress Notes (Signed)
COVID orders placed for mobile unit

## 2018-09-09 ENCOUNTER — Other Ambulatory Visit: Payer: Self-pay | Admitting: *Deleted

## 2018-09-09 DIAGNOSIS — Z20822 Contact with and (suspected) exposure to covid-19: Secondary | ICD-10-CM

## 2018-09-11 NOTE — Progress Notes (Signed)
Subjective: [name] presents today with painful, thick toenails 1-5 b/l that she cannot cut and which interfere with daily activities.  Pain is aggravated when wearing enclosed shoe gear.  Patient is diabetic and her blood sugar was 320 mg/dl this morning   Current Outpatient Medications:  .  docusate sodium (COLACE) 100 MG capsule, Take 100 mg by mouth daily as needed for mild constipation. , Disp: , Rfl:  .  EASY COMFORT PEN NEEDLES 31G X 5 MM MISC, USE TWICE A DAY WITH INSULIN PEN, Disp: , Rfl: 3 .  glucose blood (FREESTYLE TEST STRIPS) test strip, Use as instructed, Disp: 100 each, Rfl: 0 .  glucose monitoring kit (FREESTYLE) monitoring kit, 1 each by Does not apply route as needed for other., Disp: 1 each, Rfl: 0 .  ibuprofen (ADVIL,MOTRIN) 600 MG tablet, Take 600 mg by mouth every 6 (six) hours as needed for headache or mild pain. , Disp: , Rfl:  .  insulin glargine (LANTUS) 100 UNIT/ML injection, Inject 0.12 mLs (12 Units total) into the skin daily., Disp: 10 mL, Rfl: 0 .  insulin NPH-regular Human (NOVOLIN 70/30) (70-30) 100 UNIT/ML injection, Inject 20-36 Units into the skin 2 (two) times daily. INJECT 36 UNITS UNDER THE SKIN EVERY MORNING AND 20 UNITS UNDER THE SKIN EVERY EVENING, Disp: 10 mL, Rfl: 0 .  JANUMET XR [phone] MG TB24, Take 1 tablet by mouth daily., Disp: , Rfl: 3 .  Lancets (FREESTYLE) lancets, Use as instructed, Disp: 100 each, Rfl: 0 .  metFORMIN (GLUCOPHAGE) 500 MG tablet, Take 1 tablet (500 mg total) by mouth 2 (two) times daily with a meal., Disp: 30 tablet, Rfl: 0 .  omeprazole (PRILOSEC) 2 mg/mL SUSP, Take 10 mLs (20 mg total) by mouth daily., Disp: 350 mL, Rfl: 1 .  omeprazole (PRILOSEC) 20 MG capsule, Take 1 capsule (20 mg total) by mouth daily., Disp: 30 capsule, Rfl: 2 .  omeprazole (PRILOSEC) 40 MG capsule, Take 40 mg by mouth daily., Disp: , Rfl: 3 .  ondansetron (ZOFRAN) 4 MG tablet, Take 1 tablet (4 mg total) by mouth every 6 (six) hours., Disp:  10 tablet, Rfl: 0 .  senna (SENOKOT) 8.6 MG TABS tablet, Take 1 tablet by mouth daily as needed for mild constipation. , Disp: , Rfl:   No Known Allergies  Objective: Vitals:   09/07/18 1341  Temp: 98.1 F (36.7 C)    Vascular Examination: Capillary refill time immediate x 10 digits.  Dorsalis pedis and Posterior tibial pulses palpable b/l.  Digital hair absent x 10 digits.  Skin temperature gradient WNL b/l.  Dermatological Examination: Skin with normal turgor, texture and tone b/l.  Toenails 1-5 b/l discolored, thick, dystrophic with subungual debris and pain with palpation to nailbeds due to thickness of nails.  Musculoskeletal: Muscle strength 5/5 to all LE muscle groups.  Neurological: Sensation intact with 10 gram monofilament.  Vibratory sensation intact.   Assessment: Painful onychomycosis toenails 1-5 b/l  NIDDM, uncontrolled  Plan: 1. Continue diabetic foot care principles.  2. Toenails 1-5 b/l were debrided in length and girth without iatrogenic bleeding. 3. Patient to continue soft, supportive shoe gear daily. 4. Patient to report any pedal injuries to medical professional immediately. 5. Follow up 3 months.  6. Patient/POA to call should there be a concern in the interim.

## 2018-09-12 LAB — NOVEL CORONAVIRUS, NAA: SARS-CoV-2, NAA: NOT DETECTED

## 2018-09-13 NOTE — Progress Notes (Signed)
[place] Screening performed. COVID screening, temperature, and need for medical care and medications assessed. Patient agreed to the COVID-19 testing. Patient requests assistance with obtaining test strips for her glucometer. Referral sent to [facility].  Arnold Long RN MSN

## 2018-09-15 NOTE — Progress Notes (Signed)
COVID Hotel Screening performed. COVID screening, temperature, PHQ-9, and need for medical care and medications assessed. No additional needs assessed at this time.  Zebulen Simonis RN MSN 

## 2018-09-22 NOTE — Progress Notes (Signed)
COVID Hotel Screening performed. COVID screening, temperature, PHQ-9, and need for medical care and medications assessed. No additional needs assessed at this time.  Alanna Storti RN MSN 

## 2018-09-23 ENCOUNTER — Telehealth: Payer: Self-pay

## 2018-09-23 NOTE — Telephone Encounter (Signed)
6/12 message left, awaiting call back

## 2018-09-30 NOTE — Progress Notes (Signed)
COVID Hotel Screening performed. Temperature, PHQ-9, and need for medical care and medications assessed. No additional needs assessed at this time.  Asiah Browder  MSN, RN 

## 2018-10-14 ENCOUNTER — Other Ambulatory Visit: Payer: Self-pay

## 2018-10-14 DIAGNOSIS — Z20822 Contact with and (suspected) exposure to covid-19: Secondary | ICD-10-CM

## 2018-10-16 NOTE — Addendum Note (Signed)
Addended by: Brigitte Pulse on: 10/16/2018 09:17 AM   Modules accepted: Orders

## 2018-11-29 ENCOUNTER — Encounter: Payer: Self-pay | Admitting: Podiatry

## 2018-12-14 ENCOUNTER — Ambulatory Visit: Payer: Medicaid Other | Admitting: Podiatry

## 2018-12-21 ENCOUNTER — Ambulatory Visit: Payer: Medicaid Other | Admitting: Podiatry

## 2019-01-12 ENCOUNTER — Emergency Department (HOSPITAL_COMMUNITY)
Admission: EM | Admit: 2019-01-12 | Discharge: 2019-01-13 | Disposition: A | Discharge status: Home / Self Care | Payer: Medicaid Other | Attending: Emergency Medicine | Admitting: Emergency Medicine

## 2019-01-12 ENCOUNTER — Encounter (HOSPITAL_COMMUNITY): Payer: Self-pay

## 2019-01-12 ENCOUNTER — Other Ambulatory Visit: Payer: Self-pay

## 2019-01-12 DIAGNOSIS — Z79899 Other long term (current) drug therapy: Secondary | ICD-10-CM | POA: Diagnosis not present

## 2019-01-12 DIAGNOSIS — N3000 Acute cystitis without hematuria: Secondary | ICD-10-CM | POA: Diagnosis not present

## 2019-01-12 DIAGNOSIS — E1165 Type 2 diabetes mellitus with hyperglycemia: Secondary | ICD-10-CM | POA: Diagnosis not present

## 2019-01-12 DIAGNOSIS — I10 Essential (primary) hypertension: Secondary | ICD-10-CM | POA: Insufficient documentation

## 2019-01-12 DIAGNOSIS — Z794 Long term (current) use of insulin: Secondary | ICD-10-CM | POA: Insufficient documentation

## 2019-01-12 DIAGNOSIS — E782 Mixed hyperlipidemia: Secondary | ICD-10-CM | POA: Diagnosis not present

## 2019-01-12 DIAGNOSIS — R739 Hyperglycemia, unspecified: Secondary | ICD-10-CM

## 2019-01-12 LAB — URINALYSIS, ROUTINE W REFLEX MICROSCOPIC
Bilirubin Urine: NEGATIVE
Glucose, UA: >=500 mg/dL — AB
Ketones, ur: 20 mg/dL — AB
Nitrite: NEGATIVE
Protein, ur: 30 mg/dL — AB
Specific Gravity, Urine: 1.027 (ref 1.005–1.030)
WBC, UA: >50 WBC/hpf — ABNORMAL HIGH (ref 0–5)
pH: 5 (ref 5.0–8.0)

## 2019-01-12 MED ORDER — SODIUM CHLORIDE 0.9 % IV BOLUS
1000.0000 mL | Freq: Once | INTRAVENOUS | Status: AC
  Administered 2019-01-13: 01:00:00 1000 mL via INTRAVENOUS

## 2019-01-12 NOTE — ED Provider Notes (Signed)
[facility] DEPT Provider Note   CSN: [id number] Arrival date & time: 01/12/19  2116     History   Chief Complaint Chief Complaint  Patient presents with   Hyperglycemia   Abdominal Pain    HPI [name] is a 43 y.o. female.     The history is provided by the patient and medical records.     43 y.o. M with hx of DM, ectrodactyly, GERD, HLP, HTN, microcephaly, presenting to the ED for hyperglycemia.  Patient states she feels like her sugar has been elevated for a few days.  She just had a birthday and admits to eating some sugary foods.  She also has been without her insulin or metformin for about a month.  States she ran out of her medication and never got it refilled.  She does admit to frequent urination and some intermittent vomiting.  No diarrhea, fever, chills, cough, sweats, confusion, etc.  Patient also reports some LUQ abdominal pain.  She reports this has been ongoing for over 2 years since she had a hernia repair surgery.  States it has felt sore since then but nothing has really changed about it.  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      Home Medications    Prior to Admission medications   Medication Sig Start Date End Date Taking? Authorizing Provider  ibuprofen (ADVIL,MOTRIN) 600 MG tablet Take 600 mg by mouth every 6 (six) hours as needed for headache or mild pain.    Yes [provider]  insulin glargine (LANTUS) 100 UNIT/ML injection Inject 0.12 mLs (12 Units total) into the skin daily. 06/26/17  Yes Avie Echevaria B, PA-C  JANUMET XR [phone] MG TB24 Take 1 tablet by mouth daily. 02/19/18  Yes [provider]  senna (SENOKOT) 8.6 MG TABS tablet Take 1 tablet by mouth daily as needed for mild constipation.    Yes [provider]  EASY COMFORT PEN NEEDLES 31G X 5 MM MISC USE TWICE A DAY WITH INSULIN PEN 12/30/17   [provider]  glucose blood (FREESTYLE TEST STRIPS) test strip Use as instructed 04/29/17   Mackuen, Courteney Lyn, MD  glucose monitoring kit (FREESTYLE) monitoring kit 1 each by Does not apply route as needed for other. 04/29/17   Mackuen, Courteney Lyn, MD  insulin NPH-regular Human (NOVOLIN 70/30) (70-30) 100 UNIT/ML injection Inject 20-36 Units into the skin 2 (two) times daily. INJECT 36 UNITS UNDER THE SKIN EVERY MORNING AND 20 UNITS UNDER THE SKIN EVERY EVENING Patient not taking: Reported on  01/12/2019 04/17/17   Doristine Devoid, PA-C  Lancets (FREESTYLE) lancets Use as instructed 04/29/17   Mackuen, Fredia Sorrow, MD  metFORMIN (GLUCOPHAGE) 500 MG tablet Take 1 tablet (500 mg total) by mouth 2 (two) times daily with a meal. Patient not taking: Reported on 01/12/2019 05/10/17   Fatima Blank, MD  omeprazole (PRILOSEC) 2 mg/mL SUSP Take 10 mLs (20 mg total) by mouth daily. Patient not taking: Reported on 01/12/2019 06/26/17   Emeline General, PA-C  omeprazole (PRILOSEC) 20 MG capsule Take 1 capsule (20 mg total) by mouth daily. 06/26/17   Avie Echevaria B, PA-C   ondansetron (ZOFRAN) 4 MG tablet Take 1 tablet (4 mg total) by mouth every 6 (six) hours. Patient not taking: Reported on 01/12/2019 02/07/17   Nat Christen, MD    Family History Family History  Problem Relation Age of Onset   Diabetes Mother    GER disease Mother    Diabetes Father     Social History Social History   Tobacco Use   Smoking status: Never Smoker   Smokeless tobacco: Never Used  Substance Use Topics   Alcohol use: No   Drug use: No     Allergies   Patient has no known allergies.   Review of Systems Review of Systems  Gastrointestinal: Positive for abdominal pain, nausea and vomiting.  Endocrine: Positive for polyuria.       Hyperglycemia  All other systems reviewed and are negative.    Physical Exam Updated Vital Signs BP (!) 142/79 (BP Location: Right Arm)    Pulse (!) 106    Temp 98.3 F (36.8 C) (Oral)    Resp 19    Ht '4\' 9"'$  (1.448 m)    Wt 45.4 kg    SpO2 100%    BMI 21.64 kg/m   Physical Exam Vitals signs and nursing note reviewed.  Constitutional:      Appearance: She is well-developed.  HENT:     Head: Normocephalic and atraumatic.     Mouth/Throat:     Comments: Mucous membranes appear somewhat dry Eyes:     Conjunctiva/sclera: Conjunctivae normal.     Pupils: Pupils are equal, round, and reactive to light.  Neck:     Musculoskeletal: Normal range of motion.  Cardiovascular:     Rate and Rhythm: Normal rate and regular rhythm.     Heart sounds: Normal heart sounds.  Pulmonary:     Effort: Pulmonary effort is normal.     Breath sounds: Normal breath sounds.  Abdominal:     General: Bowel sounds are normal.     Palpations: Abdomen is soft.  Musculoskeletal: Normal range of motion.     Comments: Ectrodactyly of both hands  Skin:    General: Skin is warm and dry.  Neurological:     Mental Status: She is alert and oriented to person, place, and time.     Comments: AAOx3, moving all extremities well, ambulatory with steady  gait      ED Treatments / Results  Labs (all labs ordered are listed, but only abnormal results are displayed) Labs Reviewed  CBC WITH DIFFERENTIAL/PLATELET - Abnormal; Notable for the following components:      Result Value   Hemoglobin 11.2 (*)    MCH 25.0 (*)    All other components within normal limits  COMPREHENSIVE METABOLIC PANEL - Abnormal; Notable for the following components:   Sodium 133 (*)    CO2 18 (*)    Glucose, Bld  486 (*)    Calcium 8.7 (*)    AST 14 (*)    All other components within normal limits  URINALYSIS, ROUTINE W REFLEX MICROSCOPIC - Abnormal; Notable for the following components:   APPearance CLOUDY (*)    Glucose, UA >=500 (*)    Hgb urine dipstick LARGE (*)    Ketones, ur 20 (*)    Protein, ur 30 (*)    Leukocytes,Ua LARGE (*)    WBC, UA >50 (*)    Bacteria, UA MANY (*)    All other components within normal limits  CBG MONITORING, ED - Abnormal; Notable for the following components:   Glucose-Capillary 351 (*)    All other components within normal limits  CBG MONITORING, ED - Abnormal; Notable for the following components:   Glucose-Capillary 128 (*)    All other components within normal limits  URINE CULTURE  LIPASE, BLOOD  BLOOD GAS, VENOUS  I-STAT BETA HCG BLOOD, ED ([facility], [facility], [facility] ONLY)    EKG None  Radiology No results found.  Procedures Procedures (including critical care time)  Medications Ordered in ED Medications  sodium chloride 0.9 % bolus 1,000 mL (0 mLs Intravenous Stopped 01/13/19 0243)  sodium chloride 0.9 % bolus 1,000 mL (1,000 mLs Intravenous New Bag/Given 01/13/19 0317)  cefTRIAXone (ROCEPHIN) 1 g in sodium chloride 0.9 % 100 mL IVPB (1 g Intravenous New Bag/Given 01/13/19 0318)  insulin aspart (novoLOG) injection 6 Units (6 Units Intravenous Given 01/13/19 0321)     Initial Impression / Assessment and Plan / ED Course  I have reviewed the triage vital signs and the nursing notes.  Pertinent labs & imaging results  that were available during my care of the patient were reviewed by me and considered in my medical decision making (see chart for details).  43 year old female here with hyperglycemia.  She has been out of her medications for the past month, also reports poor diet recently.  She is afebrile and nontoxic.  Her mucous membranes do appear mildly dry.  She also reported some left upper abdominal pain but this is been ongoing for 2 years since a hernia repair surgery.  She does not have any peritoneal signs on exam today.  CBG with EMS was 500+.  She was given half liter of normal saline.  We will continue IV fluids pending urinalysis, labs, VBG.  Will reassess.  Unable to get enough blood for VBG and patient refusing further attempts.  Labs overall reassuring-- grossly normal CBC, CMP with hyperglycemia of 486 and mildly low bicarb of 18 but normal anion gap at 13.  UA with 20 ketones.  Not clinically consistent with DKA.  Patient was aggressively fluid resuscitated here with 2.5L total and given IV insulin with improvement of sugar to 128.  She was also found to have UTI and given dose of IV Rocephin pending urine culture.  Will plan to discharge home on Keflex.  I have refilled her Lantus as well as her Janumet.  She will need to continue monitoring her sugar closely at home.  Close follow-up with PCP.  Return here for any new/acute changes.  Final Clinical Impressions(s) / ED Diagnoses   Final diagnoses:  Hyperglycemia  Acute cystitis without hematuria    ED Discharge Orders         Ordered    insulin glargine (LANTUS) 100 UNIT/ML injection  Daily     01/13/19 0428    SitaGLIPtin-MetFORMIN HCl (JANUMET XR) [phone] MG TB24  Daily  01/13/19 0428    cephALEXin (KEFLEX) 500 MG capsule  2 times daily     01/13/19 0428           [name], PA-C 01/13/19 [place], [place], DO 01/15/19 [phone]

## 2019-01-12 NOTE — ED Notes (Signed)
Attempted IV placment x2. First one blew and second was unsuccessful. Pt agrees to wait for an IV team consult.

## 2019-01-12 NOTE — ED Triage Notes (Signed)
Patient coming from home with complaints of hyperglycemia and left upper quadrant abdominal pain. The abdominal pain started last night with nothing that improves it or makes it worse. Denies N/V/D. CBG was 513 with EMS. Administered 500 mL of NS. Patient A&O x4 and ambulatory to restroom with no assistance upon arrival.

## 2019-01-12 NOTE — ED Notes (Signed)
Pt ambulated to restroom without assistance. Gait steady  

## 2019-01-12 NOTE — ED Notes (Signed)
Patient ambulatory to restroom with steady gait and no assistance.  °

## 2019-01-12 NOTE — ED Notes (Signed)
Urine and urine culture sent to lab.  

## 2019-01-13 LAB — COMPREHENSIVE METABOLIC PANEL
ALT: 10 U/L (ref 0–44)
AST: 14 U/L — ABNORMAL LOW (ref 15–41)
Albumin: 3.5 g/dL (ref 3.5–5.0)
Alkaline Phosphatase: 81 U/L (ref 38–126)
Anion gap: 13 (ref 5–15)
BUN: 13 mg/dL (ref 6–20)
CO2: 18 mmol/L — ABNORMAL LOW (ref 22–32)
Calcium: 8.7 mg/dL — ABNORMAL LOW (ref 8.9–10.3)
Chloride: 102 mmol/L (ref 98–111)
Creatinine, Ser: 0.87 mg/dL (ref 0.44–1.00)
GFR calc Af Amer: >60 mL/min (ref >60)
GFR calc non Af Amer: >60 mL/min (ref >60)
Glucose, Bld: 486 mg/dL — ABNORMAL HIGH (ref 70–99)
Potassium: 3.5 mmol/L (ref 3.5–5.1)
Sodium: 133 mmol/L — ABNORMAL LOW (ref 135–145)
Total Bilirubin: 0.8 mg/dL (ref 0.3–1.2)
Total Protein: 7.7 g/dL (ref 6.5–8.1)

## 2019-01-13 LAB — CBC WITH DIFFERENTIAL/PLATELET
Abs Immature Granulocytes: 0.02 10*3/uL (ref 0.00–0.07)
Basophils Absolute: 0 10*3/uL (ref 0.0–0.1)
Basophils Relative: 1 %
Eosinophils Absolute: 0.1 10*3/uL (ref 0.0–0.5)
Eosinophils Relative: 1 %
HCT: 36.1 % (ref 36.0–46.0)
Hemoglobin: 11.2 g/dL — ABNORMAL LOW (ref 12.0–15.0)
Immature Granulocytes: 0 %
Lymphocytes Relative: 24 %
Lymphs Abs: 1.5 10*3/uL (ref 0.7–4.0)
MCH: 25 pg — ABNORMAL LOW (ref 26.0–34.0)
MCHC: 31 g/dL (ref 30.0–36.0)
MCV: 80.6 fL (ref 80.0–100.0)
Monocytes Absolute: 0.5 10*3/uL (ref 0.1–1.0)
Monocytes Relative: 8 %
Neutro Abs: 4.3 10*3/uL (ref 1.7–7.7)
Neutrophils Relative %: 66 %
Platelets: 329 10*3/uL (ref 150–400)
RBC: 4.48 MIL/uL (ref 3.87–5.11)
RDW: 13.8 % (ref 11.5–15.5)
WBC: 6.5 10*3/uL (ref 4.0–10.5)
nRBC: 0 % (ref 0.0–0.2)

## 2019-01-13 LAB — URINE CULTURE: Culture: >=100000 — AB

## 2019-01-13 LAB — CBG MONITORING, ED
Glucose-Capillary: 351 mg/dL — ABNORMAL HIGH (ref 70–99)
Glucose-Capillary: 128 mg/dL — ABNORMAL HIGH (ref 70–99)

## 2019-01-13 LAB — LIPASE, BLOOD: Lipase: 31 U/L (ref 11–51)

## 2019-01-13 MED ORDER — INSULIN GLARGINE 100 UNIT/ML ~~LOC~~ SOLN: 12.0000 [IU] | Freq: Every day | SUBCUTANEOUS | 2 refills | Status: DC

## 2019-01-13 MED ORDER — INSULIN ASPART 100 UNIT/ML ~~LOC~~ SOLN
8.0000 [IU] | Freq: Once | SUBCUTANEOUS | Status: DC
  Filled 2019-01-13: qty 0.08

## 2019-01-13 MED ORDER — JANUMET XR 100-1000 MG PO TB24: 1.0000 | ORAL_TABLET | Freq: Every day | ORAL | 1 refills | Status: DC

## 2019-01-13 MED ORDER — INSULIN ASPART 100 UNIT/ML ~~LOC~~ SOLN
6.0000 [IU] | Freq: Once | SUBCUTANEOUS | Status: AC
  Administered 2019-01-13: 6 [IU] via INTRAVENOUS
  Filled 2019-01-13: qty 0.06

## 2019-01-13 MED ORDER — SODIUM CHLORIDE 0.9 % IV BOLUS
1000.0000 mL | Freq: Once | INTRAVENOUS | Status: AC
  Administered 2019-01-13: 03:00:00 1000 mL via INTRAVENOUS

## 2019-01-13 MED ORDER — SODIUM CHLORIDE 0.9 % IV SOLN
1.0000 g | Freq: Once | INTRAVENOUS | Status: AC
  Administered 2019-01-13: 1 g via INTRAVENOUS
  Filled 2019-01-13: qty 10

## 2019-01-13 MED ORDER — CEPHALEXIN 500 MG PO CAPS: 500.0000 mg | ORAL_CAPSULE | Freq: Two times a day (BID) | ORAL | 0 refills | Status: DC

## 2019-01-13 NOTE — Discharge Instructions (Signed)
I have refilled your diabetes medication for you.  Pick them up from the pharmacy.  Watch your diet, especially your sugar intake.  Make sure you drink plenty of water. Follow-up with your primary care doctor. Return to the ED for new or worsening symptoms.

## 2019-01-13 NOTE — ED Notes (Signed)
Patient ambulatory to restroom with no assistance. Patient provided water to drink and petroleum jelly for dry lips. Patient in NAD at this time.

## 2019-01-14 ENCOUNTER — Telehealth: Payer: Self-pay

## 2019-01-14 NOTE — Telephone Encounter (Signed)
Post ED Visit - Positive Culture Follow-up  Culture report reviewed by antimicrobial stewardship pharmacist: [place] Team []  Elenor Quinones, Pharm.D. []  Heide Guile, Pharm.D., BCPS AQ-ID []  Parks Neptune, Pharm.D., BCPS []  Alycia Rossetti, Pharm.D., BCPS []  Carter, Pharm.D., BCPS, AAHIVP []  Legrand Como, Pharm.D., BCPS, AAHIVP []  Salome Arnt, PharmD, BCPS []  Johnnette Gourd, PharmD, BCPS []  Hughes Better, PharmD, BCPS []  Leeroy Cha, PharmD []  Laqueta Linden, PharmD, BCPS []  Albertina Parr, PharmD  [place] Team [x]  Leodis Sias, PharmD []  Lindell Spar, PharmD []  Royetta Asal, PharmD []  Graylin Shiver, Rph []  Rema Fendt) Glennon Mac, PharmD []  Arlyn Dunning, PharmD []  Netta Cedars, PharmD []  Dia Sitter, PharmD []  Leone Haven, PharmD []  Gretta Arab, PharmD []  Theodis Shove, PharmD []  Peggyann Juba, PharmD []  Reuel Boom, PharmD   Positive urine culture Treated with Cephalexin, organism sensitive to the same and no further patient follow-up is required at this time.  Genia Del 01/14/2019, 9:34 AM

## 2019-01-24 ENCOUNTER — Emergency Department
Admission: EM | Admit: 2019-01-24 | Discharge: 2019-01-25 | Disposition: A | Discharge status: Home / Self Care | Payer: Medicaid Other | Source: Home / Self Care | Attending: Emergency Medicine | Admitting: Emergency Medicine

## 2019-01-24 ENCOUNTER — Other Ambulatory Visit: Payer: Self-pay

## 2019-01-24 ENCOUNTER — Encounter: Payer: Self-pay | Admitting: Emergency Medicine

## 2019-01-24 ENCOUNTER — Emergency Department
Admission: EM | Admit: 2019-01-24 | Discharge: 2019-01-24 | Disposition: A | Discharge status: Home / Self Care | Payer: Medicaid Other | Attending: Emergency Medicine | Admitting: Emergency Medicine

## 2019-01-24 DIAGNOSIS — I10 Essential (primary) hypertension: Secondary | ICD-10-CM | POA: Insufficient documentation

## 2019-01-24 DIAGNOSIS — Z598 Other problems related to housing and economic circumstances: Secondary | ICD-10-CM | POA: Insufficient documentation

## 2019-01-24 DIAGNOSIS — E1165 Type 2 diabetes mellitus with hyperglycemia: Secondary | ICD-10-CM | POA: Insufficient documentation

## 2019-01-24 DIAGNOSIS — Z5989 Other problems related to housing and economic circumstances: Secondary | ICD-10-CM

## 2019-01-24 DIAGNOSIS — Z590 Homelessness: Secondary | ICD-10-CM | POA: Insufficient documentation

## 2019-01-24 DIAGNOSIS — Z794 Long term (current) use of insulin: Secondary | ICD-10-CM | POA: Insufficient documentation

## 2019-01-24 DIAGNOSIS — R739 Hyperglycemia, unspecified: Secondary | ICD-10-CM | POA: Diagnosis present

## 2019-01-24 HISTORY — DX: Unspecified intellectual disabilities: F79

## 2019-01-24 HISTORY — DX: Chromosomal abnormality, unspecified: Q99.9

## 2019-01-24 HISTORY — DX: Di George's syndrome: D82.1

## 2019-01-24 LAB — CBC WITH DIFFERENTIAL/PLATELET
Abs Immature Granulocytes: 0.01 10*3/uL (ref 0.00–0.07)
Basophils Absolute: 0 10*3/uL (ref 0.0–0.1)
Basophils Relative: 1 %
Eosinophils Absolute: 0.1 10*3/uL (ref 0.0–0.5)
Eosinophils Relative: 1 %
HCT: 36.9 % (ref 36.0–46.0)
Hemoglobin: 11.7 g/dL — ABNORMAL LOW (ref 12.0–15.0)
Immature Granulocytes: 0 %
Lymphocytes Relative: 32 %
Lymphs Abs: 1.6 10*3/uL (ref 0.7–4.0)
MCH: 25.6 pg — ABNORMAL LOW (ref 26.0–34.0)
MCHC: 31.7 g/dL (ref 30.0–36.0)
MCV: 80.7 fL (ref 80.0–100.0)
Monocytes Absolute: 0.4 10*3/uL (ref 0.1–1.0)
Monocytes Relative: 7 %
Neutro Abs: 3 10*3/uL (ref 1.7–7.7)
Neutrophils Relative %: 59 %
Platelets: 332 10*3/uL (ref 150–400)
RBC: 4.57 MIL/uL (ref 3.87–5.11)
RDW: 14.9 % (ref 11.5–15.5)
WBC: 5.1 10*3/uL (ref 4.0–10.5)
nRBC: 0 % (ref 0.0–0.2)

## 2019-01-24 LAB — BASIC METABOLIC PANEL
Anion gap: 10 (ref 5–15)
Anion gap: 9 (ref 5–15)
BUN: 24 mg/dL — ABNORMAL HIGH (ref 6–20)
BUN: 21 mg/dL — ABNORMAL HIGH (ref 6–20)
CO2: 22 mmol/L (ref 22–32)
CO2: 25 mmol/L (ref 22–32)
Calcium: 8.7 mg/dL — ABNORMAL LOW (ref 8.9–10.3)
Calcium: 9.3 mg/dL (ref 8.9–10.3)
Chloride: 103 mmol/L (ref 98–111)
Chloride: 102 mmol/L (ref 98–111)
Creatinine, Ser: 0.49 mg/dL (ref 0.44–1.00)
Creatinine, Ser: 0.62 mg/dL (ref 0.44–1.00)
GFR calc Af Amer: >60 mL/min (ref >60)
GFR calc Af Amer: >60 mL/min (ref >60)
GFR calc non Af Amer: >60 mL/min (ref >60)
GFR calc non Af Amer: >60 mL/min (ref >60)
Glucose, Bld: 213 mg/dL — ABNORMAL HIGH (ref 70–99)
Glucose, Bld: 436 mg/dL — ABNORMAL HIGH (ref 70–99)
Potassium: 4.6 mmol/L (ref 3.5–5.1)
Potassium: 4.2 mmol/L (ref 3.5–5.1)
Sodium: 135 mmol/L (ref 135–145)
Sodium: 136 mmol/L (ref 135–145)

## 2019-01-24 LAB — GLUCOSE, CAPILLARY
Glucose-Capillary: 224 mg/dL — ABNORMAL HIGH (ref 70–99)
Glucose-Capillary: 390 mg/dL — ABNORMAL HIGH (ref 70–99)
Glucose-Capillary: 286 mg/dL — ABNORMAL HIGH (ref 70–99)

## 2019-01-24 MED ORDER — INSULIN ASPART 100 UNIT/ML ~~LOC~~ SOLN
SUBCUTANEOUS | Status: AC
  Filled 2019-01-24: qty 1

## 2019-01-24 MED ORDER — METFORMIN HCL ER 500 MG PO TB24
1000.0000 mg | ORAL_TABLET | Freq: Every day | ORAL | Status: DC
  Administered 2019-01-25: 09:00:00 1000 mg via ORAL
  Filled 2019-01-24 (×2): qty 2

## 2019-01-24 MED ORDER — INSULIN ASPART 100 UNIT/ML ~~LOC~~ SOLN
0.0000 [IU] | Freq: Every day | SUBCUTANEOUS | Status: DC
  Administered 2019-01-24: 3 [IU] via SUBCUTANEOUS

## 2019-01-24 MED ORDER — PANTOPRAZOLE SODIUM 40 MG PO TBEC
40.0000 mg | DELAYED_RELEASE_TABLET | Freq: Every day | ORAL | Status: DC
  Administered 2019-01-24 – 2019-01-25 (×2): 40 mg via ORAL
  Filled 2019-01-24 (×2): qty 1

## 2019-01-24 MED ORDER — SITAGLIP PHOS-METFORMIN HCL ER 100-1000 MG PO TB24: 1.0000 | ORAL_TABLET | Freq: Every day | ORAL | Status: DC

## 2019-01-24 MED ORDER — INSULIN ASPART 100 UNIT/ML ~~LOC~~ SOLN
0.0000 [IU] | Freq: Every day | SUBCUTANEOUS | Status: DC
  Administered 2019-01-24: 23:00:00 3 [IU] via SUBCUTANEOUS

## 2019-01-24 MED ORDER — SITAGLIPTIN PHOSPHATE 100 MG PO TABS
100.0000 mg | ORAL_TABLET | Freq: Every day | ORAL | Status: DC
  Administered 2019-01-24 – 2019-01-25 (×2): 100 mg via ORAL
  Filled 2019-01-24 (×2): qty 1

## 2019-01-24 MED ORDER — INSULIN ASPART 100 UNIT/ML ~~LOC~~ SOLN
0.0000 [IU] | Freq: Three times a day (TID) | SUBCUTANEOUS | Status: DC
  Administered 2019-01-25: 3 [IU] via SUBCUTANEOUS
  Filled 2019-01-24: qty 1

## 2019-01-24 NOTE — ED Notes (Signed)
[name] from [employer] called and reported that since in [place] needs to be contacted and placement referral done.  Will discuss with social worker for hospital and inform them of recommendations.

## 2019-01-24 NOTE — ED Notes (Signed)
Pt given water and warm blanket  
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
## 2019-01-24 NOTE — ED Notes (Signed)
Called and spoke with ebony at [employer].  Explained patients situation and concern for patient and possible cognitive impairment.  Do not feel comfortable that patient would be able to manage buses or accessing food.  Hx of microcephaly.  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  Pt oriented but poor historian, unsure of how she got to where she is. No longer living in [place].  Was previously living with aunt per [facility] notes but pt reports was with brother and that right now he does not have his home anymore. [name] is going to make some calls and look into patients case and return call to this RN.  Dr Kerman Passey notified.

## 2019-01-24 NOTE — Social Work (Signed)
Patient stated that she would like to go to a homeless shelter. CSW spoke to patient's nurse, Mateo Flow, and discussed whether or not patient is competent to make her own decisions. EDP will consult psych.  If not competent, APS will be called for guardianship assistance. If she is competent, CSW will call shelters to see which one has a bed available.    [place], [place] ED  [phone]

## 2019-01-24 NOTE — ED Notes (Signed)
Pt up to bathroom. Gave pt lunch tray. No other needs at this time.  Given sprite.

## 2019-01-24 NOTE — ED Notes (Signed)
Attempt made to call APS for [place]. Awaiting return page.

## 2019-01-24 NOTE — ED Notes (Signed)
Unlabored. NAD.  Waiting on disposition.  No needs at this time.

## 2019-01-24 NOTE — ED Provider Notes (Signed)
-----------------------------------------   2:55 PM on 01/24/2019 -----------------------------------------  Social work has seen the patient.  They have arranged with a local homeless shelter who can help the patient.  We can provide a cab voucher for the patient to get to the shelter.  I have personally seen and evaluated the patient, she deftly has some physical as well as mild mental delays however the patient overall appears to have capacity to make her own medical decisions and to care for herself.  Patient doses all of her own medications including checking her blood sugar and dosing subcutaneous insulin.  Patient was able to give me the doses of all of her medications, she knows the date she is oriented x4.  I spoke to the patient about attempting to find her group home to live in, the patient specifically says she does not want anyone helping to take care of her and states she only needs somewhere to stay for the next few nights and then she is going to move in with her brother per patient.  As the patient appears to have capacity to make her own medical decisions and wishes to go to a homeless shelter I believe this is a rational/reasonable plan for the patient.   Harvest Dark, MD 01/24/19 1456

## 2019-01-24 NOTE — TOC Initial Note (Addendum)
Transition of Care [facility]) - Initial/Assessment Note    Patient Details  Name: [name] MRN: [id number] Date of Birth: 1975/09/28  [facility] CM/SW Contact:    Fredric Mare, LCSW Phone Number: 01/24/2019, 11:06 AM  Clinical Narrative:                  Patient is a 43 year old female that presents to the ED via [facility]. Patient reports that a lady named, Leanord Hawking(?) who owns a boarding house, lied to her and dropped her off at the police department. Patient states that Solmon Ice shared with her that she was informed that she would be going to Felicia's mother's house for a few days, due to her house being painted.  Patient is unsure of whether or not she wants to go to a group home or homeless shelter. Patient shared that she is supposed to be meeting up with her brother, who is currently staying at [employer] in [facility], in about a week to live with him. Patient stated that the last time she spoke to her brother was yesterday (01/23/2019). Patient's brother does not have a phone number, but patient reports "he calls me."   CSW let patient know that she will return to see where patient would like to go. Patient currently refusing COVID swab.       Expected Discharge Plan: Homeless Shelter Barriers to Discharge: (patient unsure of whether or not she wants to go to a group home or shelter. She would like to meet up with her brother.)   Patient Goals and CMS Choice        Expected Discharge Plan and Services Expected Discharge Plan: Homeless Shelter   Discharge Planning Services: CM Consult   Living arrangements for the past 2 months: Homeless                                      Prior Living Arrangements/Services Living arrangements for the past 2 months: Homeless Lives with:: Self Patient language and need for interpreter reviewed:: Yes        Need for Family Participation in Patient Care: Yes (Comment) Care giver  support system in place?: No (comment)   Criminal Activity/Legal Involvement Pertinent to Current Situation/Hospitalization: No - Comment as needed  Activities of Daily Living      Permission Sought/Granted Permission sought to share information with : Facility Sport and exercise psychologist, Family Supports Permission granted to share information with : Yes, Verbal Permission Granted              Emotional Assessment Appearance:: Appears stated age Attitude/Demeanor/Rapport: Engaged, Self-Confident Affect (typically observed): Accepting, Appropriate, Calm, Pleasant Orientation: : Oriented to Self, Oriented to Place, Oriented to Situation, Oriented to  Time Alcohol / Substance Use: Never Used Psych Involvement: No (comment)  Admission diagnosis:  diabetic, not taking medication Patient Active Problem List   Diagnosis Date Noted  . Intellectual disability 03/09/2018  . Microcephaly ([place]) 02/19/2017  . 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 Incarcerated ventral hernia 02/10/2017  . DM (diabetes mellitus) type 2, uncontrolled, with ketoacidosis ([place]) 02/10/2017  . Protein-calorie malnutrition, severe ([place]) 02/10/2017  . H/O pelvic mass   . DKA (diabetic ketoacidoses) ([place]) 02/09/2017  . Hyponatremia 02/09/2017  . Hyperkalemia 02/09/2017  . Homelessness 02/09/2017  . Giant ovarian mass    PCP:  Patient, No Pcp Per Pharmacy:   [facility] [place], [place] - [address] [place] [address] Phone: [phone] Fax: [phone]     Social Determinants of Health (SDOH) Interventions    Readmission Risk Interventions No flowsheet data found.
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
## 2019-01-24 NOTE — ED Notes (Signed)
Spoke with kallice with [place] DSS who states that we need to call [place] that pt is not a resident of [facility]. Report to valerie including need for [place] DSS to be contacted.

## 2019-01-24 NOTE — ED Provider Notes (Signed)
[facility] Emergency Department Provider Note   ____________________________________________    I have reviewed the triage vital signs and the nursing notes.   HISTORY  Chief Complaint Hyperglycemia     HPI [name] is a 43 y.o. female with history as noted below who spent several days in our emergency department was just discharged today but  states that when she got to the homeless shelter it was closed so she returned to the ED.  She has no physical complaints at this time.  She is homeless and needs a place to stay  Past Medical History:  Diagnosis Date  . Diabetes mellitus without complication ([place])   . 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 Ectrodactyly   . Genetic defect    genetic mutation of unknown significance per [facility] genetic notes  . GERD (gastroesophageal reflux disease)   . Hypercholesterolemia   . Hypertension   . Intellectual disability   . Microcephaly ([place])   . Pelvic mass   . Ventral hernia     Patient Active Problem List   Diagnosis Date Noted  . Intellectual disability 03/09/2018  . Microcephaly ([place]) 02/19/2017  . 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 Incarcerated ventral hernia 02/10/2017  . DM (diabetes mellitus) type 2, uncontrolled, with ketoacidosis ([place]) 02/10/2017  . Protein-calorie malnutrition, severe ([place]) 02/10/2017  . H/O pelvic mass   . DKA (diabetic ketoacidoses) ([place]) 02/09/2017  . Hyponatremia 02/09/2017  . Hyperkalemia 02/09/2017  . Homelessness 02/09/2017  . Giant ovarian mass     Past Surgical History:  Procedure Laterality Date  . CHOLECYSTECTOMY    . HAND SURGERY Right   . HERNIA REPAIR      Prior to Admission medications   Medication Sig Start Date End Date Taking? Authorizing Provider  cephALEXin (KEFLEX) 500 MG capsule Take 1 capsule (500 mg total) by mouth 2 (two) times daily. 01/13/19   [name], PA-C  EASY COMFORT PEN NEEDLES 31G X 5 MM MISC USE TWICE A DAY WITH INSULIN  PEN 12/30/17   [provider]  glucose blood (FREESTYLE TEST STRIPS) test strip Use as instructed 04/29/17   Mackuen, Courteney Lyn, MD  glucose monitoring kit (FREESTYLE) monitoring kit 1 each by Does not apply route as needed for other. 04/29/17   Mackuen, Courteney Lyn, MD  ibuprofen (ADVIL,MOTRIN) 600 MG tablet Take 600 mg by mouth every 6 (six) hours as needed for headache or mild pain.     [provider]  insulin glargine (LANTUS) 100 UNIT/ML injection Inject 0.12 mLs (12 Units total) into the skin daily. 01/13/19   [name], PA-C  insulin NPH-regular Human (NOVOLIN 70/30) (70-30) 100 UNIT/ML injection Inject 20-36 Units into the skin 2 (two) times daily. INJECT 36 UNITS UNDER THE SKIN EVERY MORNING AND 20 UNITS UNDER THE SKIN EVERY EVENING Patient not taking: Reported on 01/12/2019 04/17/17   Ocie Cornfield T, PA-C  Lancets (FREESTYLE) lancets Use as instructed 04/29/17   Mackuen, Courteney Lyn, MD  metFORMIN (GLUCOPHAGE) 500 MG tablet Take 1 tablet (500 mg total) by mouth 2 (two) times daily with a meal. Patient not taking: Reported on 01/12/2019 05/10/17   Fatima Blank, MD  omeprazole (PRILOSEC) 2 mg/mL SUSP Take 10 mLs (20 mg total) by mouth daily. Patient not taking: Reported on 01/12/2019 06/26/17   Emeline General, PA-C  omeprazole (PRILOSEC) 20 MG capsule Take 1 capsule (20 mg total) by mouth daily. 06/26/17   Emeline General, PA-C  ondansetron (ZOFRAN) 4 MG tablet Take 1 tablet (4 mg total) by mouth every 6 (six) hours. Patient not taking: Reported on 01/12/2019 02/07/17   Nat Christen, MD  senna (SENOKOT) 8.6 MG TABS tablet Take 1 tablet by mouth daily as needed for mild constipation.     [provider]  SitaGLIPtin-MetFORMIN HCl (JANUMET XR) [id number] MG TB24 Take 1 tablet by mouth daily. 01/13/19   [name], PA-C     Allergies Patient has no known allergies.  Family History  Problem Relation Age of Onset  . Diabetes Mother   .  GER disease Mother   . Diabetes Father     Social History Social History   Tobacco Use  . Smoking status: Never Smoker  . Smokeless tobacco: Never Used  Substance Use Topics  . Alcohol use: No  . Drug use: No    Review of Systems  Constitutional: No fever/chills  Cardiovascular: Denies chest pain. Respiratory: Denies shortness of breath. Gastrointestinal: No abdominal pain.  No nausea, no vomiting.   Genitourinary: Negative for dysuria.  Neurological: Negative for headaches   ____________________________________________   PHYSICAL EXAM:  VITAL SIGNS: ED Triage Vitals  Enc Vitals Group     BP 01/24/19 1937 120/73     Pulse Rate 01/24/19 1937 (!) 110     Resp 01/24/19 1937 20     Temp 01/24/19 1937 97.8 F (36.6 C)     Temp Source 01/24/19 1937 Oral     SpO2 01/24/19 1937 98 %     Weight 01/24/19 1938 45.4 kg (100 lb 1.4 oz)     Height 01/24/19 1938 1.499 m (_0 )     Head Circumference --      Peak Flow --      Pain Score 01/24/19 1938 0     Pain Loc --      Pain Edu? --      Excl. in [place]? --     Constitutional: Alert and oriented.   Head: Atraumatic.    Cardiovascular: Normal rate, regular rhythm. Grossly normal heart sounds.  Good peripheral circulation. Respiratory: Normal respiratory effort.  No retractions. Lungs CTAB. Gastrointestinal: Soft and nontender. No distention.  No CVA tenderness.  Musculoskeletal: .  Warm and well perfused Neurologic:  Normal speech and language. No gross focal neurologic deficits are appreciated.  Skin:  Skin is warm, dry and intact. No rash noted. Psychiatric: Mood and affect are normal. Speech and behavior are normal.  ____________________________________________   LABS (all labs ordered are listed, but only abnormal results are displayed)  Labs Reviewed  GLUCOSE, CAPILLARY - Abnormal; Notable for the following components:      Result Value   Glucose-Capillary 390 (*)    All other components within normal  limits  BASIC METABOLIC PANEL - Abnormal; Notable for the following components:   Glucose, Bld 436 (*)    BUN 21 (*)    All other components within normal limits   ____________________________________________  EKG  None ____________________________________________  RADIOLOGY   ____________________________________________   PROCEDURES  Procedure(s) performed: No  Procedures   Critical Care performed: No ____________________________________________   INITIAL IMPRESSION / ASSESSMENT AND PLAN / ED COURSE  Pertinent labs & imaging results that were available during my care of the patient were reviewed by me and considered in my medical decision making (see chart for details).  Patient's glucose is mildly elevated, will restart her Metformin/sitagliptin, consult social work    ____________________________________________   FINAL CLINICAL IMPRESSION(S) /  ED DIAGNOSES  Final diagnoses:  Hyperglycemia        Note:  This document was prepared using Dragon voice recognition software and may include unintentional dictation errors.   Lavonia Drafts, MD 01/24/19 [phone]

## 2019-01-24 NOTE — ED Notes (Signed)
Assisted pt to cab. Pt had all her belongings.

## 2019-01-24 NOTE — ED Notes (Signed)
Put in diet for pt, and called dietary for food tray. Provided pt with crackers/PB.

## 2019-01-24 NOTE — ED Provider Notes (Signed)
[facility] Emergency Department Provider Note   ____________________________________________   I have reviewed the triage vital signs and the nursing notes.   HISTORY  Chief Complaint Hyperglycemia   History limited by: Not Limited   HPI [name] is a 43 y.o. female who presents to the emergency department today brought by police.  Apparently the patient was dropped off at the police station.  Unfortunately it does not appear anyone has a great understanding of this patient's living situation or why she was dropped at the police department.  Patient does have intellectual disability cannot give a good history of what is happening.  She does not have any complaints at this time.   Records reviewed. Per medical record review patient has a history of HLD, HTN, intellectual disability.   Past Medical History:  Diagnosis Date  . Diabetes mellitus without complication ([place])   . Ectrodactyly   . GERD (gastroesophageal reflux disease)   . Hypercholesterolemia   . Hypertension   . Microcephaly ([place])   . Pelvic mass   . Ventral hernia     Patient Active Problem List   Diagnosis Date Noted  . Intellectual disability 03/09/2018  . Microcephaly ([place]) 02/19/2017  . 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 Incarcerated ventral hernia 02/10/2017  . DM (diabetes mellitus) type 2, uncontrolled, with ketoacidosis ([place]) 02/10/2017  . Protein-calorie malnutrition, severe ([place]) 02/10/2017  . H/O pelvic mass   . DKA (diabetic ketoacidoses) ([place]) 02/09/2017  . Hyponatremia 02/09/2017  . Hyperkalemia 02/09/2017  . Homelessness 02/09/2017  . Giant ovarian mass     Past Surgical History:  Procedure Laterality Date  . CHOLECYSTECTOMY    . HAND SURGERY Right   . HERNIA REPAIR      Prior to Admission medications   Medication Sig Start Date End Date Taking? Authorizing Provider  cephALEXin (KEFLEX) 500 MG capsule Take 1 capsule (500 mg total) by mouth 2  (two) times daily. 01/13/19   [name], PA-C  EASY COMFORT PEN NEEDLES 31G X 5 MM MISC USE TWICE A DAY WITH INSULIN PEN 12/30/17   [provider]  glucose blood (FREESTYLE TEST STRIPS) test strip Use as instructed 04/29/17   Mackuen, Courteney Lyn, MD  glucose monitoring kit (FREESTYLE) monitoring kit 1 each by Does not apply route as needed for other. 04/29/17   Mackuen, Courteney Lyn, MD  ibuprofen (ADVIL,MOTRIN) 600 MG tablet Take 600 mg by mouth every 6 (six) hours as needed for headache or mild pain.     [provider]  insulin glargine (LANTUS) 100 UNIT/ML injection Inject 0.12 mLs (12 Units total) into the skin daily. 01/13/19   [name], PA-C  insulin NPH-regular Human (NOVOLIN 70/30) (70-30) 100 UNIT/ML injection Inject 20-36 Units into the skin 2 (two) times daily. INJECT 36 UNITS UNDER THE SKIN EVERY MORNING AND 20 UNITS UNDER THE SKIN EVERY EVENING Patient not taking: Reported on 01/12/2019 04/17/17   Ocie Cornfield T, PA-C  Lancets (FREESTYLE) lancets Use as instructed 04/29/17   Mackuen, Courteney Lyn, MD  metFORMIN (GLUCOPHAGE) 500 MG tablet Take 1 tablet (500 mg total) by mouth 2 (two) times daily with a meal. Patient not taking: Reported on 01/12/2019 05/10/17   Fatima Blank, MD  omeprazole (PRILOSEC) 2 mg/mL SUSP Take 10 mLs (20 mg total) by mouth daily. Patient not taking: Reported on 01/12/2019 06/26/17   Emeline General, PA-C  omeprazole (PRILOSEC) 20 MG capsule Take 1 capsule (20 mg total) by mouth  daily. 06/26/17   Avie Echevaria B, PA-C  ondansetron (ZOFRAN) 4 MG tablet Take 1 tablet (4 mg total) by mouth every 6 (six) hours. Patient not taking: Reported on 01/12/2019 02/07/17   Nat Christen, MD  senna (SENOKOT) 8.6 MG TABS tablet Take 1 tablet by mouth daily as needed for mild constipation.     [provider]  SitaGLIPtin-MetFORMIN HCl (JANUMET XR) [phone] MG TB24 Take 1 tablet by mouth daily. 01/13/19   [name], PA-C     Allergies Patient has no known allergies.  Family History  Problem Relation Age of Onset  . Diabetes Mother   . GER disease Mother   . Diabetes Father     Social History Social History   Tobacco Use  . Smoking status: Never Smoker  . Smokeless tobacco: Never Used  Substance Use Topics  . Alcohol use: No  . Drug use: No    Review of Systems Constitutional: No fever/chills Eyes: No visual changes. ENT: No sore throat. Cardiovascular: Denies chest pain. Respiratory: Denies shortness of breath. Gastrointestinal: No abdominal pain.  No nausea, no vomiting.  No diarrhea.   Genitourinary: Negative for dysuria. Musculoskeletal: Negative for back pain. Skin: Negative for rash. Neurological: Negative for headaches, focal weakness or numbness.  ____________________________________________   PHYSICAL EXAM:  VITAL SIGNS: ED Triage Vitals  Enc Vitals Group     BP 01/24/19 0606 119/80     Pulse Rate 01/24/19 0612 90     Resp 01/24/19 0612 14     Temp --      Temp src --      SpO2 01/24/19 0612 100 %     Weight 01/24/19 0323 100 lb 1.4 oz (45.4 kg)     Height 01/24/19 0323 '4\' 9"'$  (1.448 m)     Head Circumference --      Peak Flow --      Pain Score 01/24/19 0612 0    Constitutional: Awake and alert.  Eyes: Conjunctivae are normal.  ENT      Head: Normocephalic and atraumatic.      Nose: No congestion/rhinnorhea.      Mouth/Throat: Mucous membranes are moist.      Neck: No stridor. Hematological/Lymphatic/Immunilogical: No cervical lymphadenopathy. Cardiovascular: Normal rate, regular rhythm.  No murmurs, rubs, or gallops.  Respiratory: Normal respiratory effort without tachypnea nor retractions. Breath sounds are clear and equal bilaterally. No wheezes/rales/rhonchi. Gastrointestinal: Soft and non tender. No rebound. No guarding.  Genitourinary: Deferred Musculoskeletal: Ectrodactyl Neurologic:  Intellectual disability.  Skin:  Skin is warm, dry and intact. No  rash noted. Psychiatric: Mood and affect are normal. ____________________________________________    LABS (pertinent positives/negatives)  Glucose 224  ____________________________________________   EKG  None  ____________________________________________    RADIOLOGY  None  ____________________________________________   PROCEDURES  Procedures  ____________________________________________   INITIAL IMPRESSION / ASSESSMENT AND PLAN / ED COURSE  Pertinent labs & imaging results that were available during my care of the patient were reviewed by me and considered in my medical decision making (see chart for details).   Patient brought by police department after being dropped off of the police.  Is unclear what the patient's living situation is at this time.  Patient denies any complaints.  Will check basic blood work and have social work evaluate.  ____________________________________________   FINAL CLINICAL IMPRESSION(S) / ED DIAGNOSES  Final diagnoses:  Living accommodation issues     Note: This dictation was prepared with Sales executive. Any transcriptional errors that  result from this process are unintentional     Nance Pear, MD 01/24/19 0730

## 2019-01-24 NOTE — ED Notes (Signed)
Called for triage no answer  

## 2019-01-24 NOTE — ED Notes (Signed)
Gave Pt food tray. 

## 2019-01-24 NOTE — ED Notes (Signed)
Spoke with [place], social work for hospital.  She is working patient for placement options and at this time DSS does not need to be contacted for placement as pt is own guardian and social work will help patient with placement.

## 2019-01-24 NOTE — ED Notes (Signed)
Pt initially refusing COVID swab, informed pt that she would need to be swabbed if we found placement for her. Pt stated she would reconsider and to give her 30 minutes.

## 2019-01-24 NOTE — ED Notes (Signed)
Updated pt and let her know we were still waiting on the cab for discharge.

## 2019-01-24 NOTE — ED Triage Notes (Signed)
Pt brought to ED by police officer [place] after sitting at the station since 11pm; he states they have been unable to reach pt's family, and they are her caregivers; pt says she was staying with a lady, but does not know her name; says the woman says she lied so she dropped her off at the police station; pt says "I think my blood sugar is up"; pt says she took insulin but officer says she didn't take any medication; pt denies any symptoms

## 2019-01-24 NOTE — ED Notes (Signed)
Cab called. 25-30 min ETA per report

## 2019-01-24 NOTE — ED Triage Notes (Signed)
Pt here for hyperglycemia and homelessness. States was here earlier for the same and discharged home, states "they told me not to take any medicine". Pt also states she tried to go to shelter but arrived too late. Pt states she wants to be placed in assisted living facility.

## 2019-01-24 NOTE — ED Notes (Signed)
Pt ok for discharge per dr Kerman Passey. Pt able to verify her medications and doses. Pt wanting to go to homeless shelter. Does not want to try and go to group home.  Pt is oriented.

## 2019-01-24 NOTE — ED Notes (Signed)
Pt called for triage, no answer

## 2019-01-25 LAB — GLUCOSE, CAPILLARY: Glucose-Capillary: 173 mg/dL — ABNORMAL HIGH (ref 70–99)

## 2019-01-25 NOTE — ED Notes (Signed)
Patient discharged to lobby to wait on cab to arrive. Patient given multiple papers of resources provided by social work.

## 2019-01-25 NOTE — Social Work (Addendum)
Patient returned to the ED yesterday evening, after claiming that she arrived at the shelter too late. Patient left here at approximately 4:30pm. CSW spoke with patient, and refuses to go to a group home, and only wanting to stay somewhere for "a week." CSW explained that this can only be done at a shelter. Patient understood.  Patient can be provided with bus pass to get to the shelter.   Patient was provided with housing and shelter information, as well as resources list for [place] signing off. Please consult if any social work needs arise.   [place], [place] [facility]  [phone]

## 2019-01-25 NOTE — ED Notes (Signed)
G. Polidor LCSW at bedside speaking to patient.

## 2019-02-14 ENCOUNTER — Encounter: Payer: Self-pay | Admitting: Podiatry

## 2019-02-14 ENCOUNTER — Other Ambulatory Visit: Payer: Self-pay

## 2019-02-14 ENCOUNTER — Ambulatory Visit: Payer: Medicaid Other | Admitting: Podiatry

## 2019-02-14 DIAGNOSIS — B351 Tinea unguium: Secondary | ICD-10-CM | POA: Diagnosis not present

## 2019-02-14 DIAGNOSIS — M79675 Pain in left toe(s): Secondary | ICD-10-CM

## 2019-02-14 DIAGNOSIS — E0865 Diabetes mellitus due to underlying condition with hyperglycemia: Secondary | ICD-10-CM | POA: Diagnosis not present

## 2019-02-14 DIAGNOSIS — M79674 Pain in right toe(s): Secondary | ICD-10-CM

## 2019-02-14 NOTE — Patient Instructions (Signed)
Diabetes Mellitus and Foot Care Foot care is an important part of your health, especially when you have diabetes. Diabetes may cause you to have problems because of poor blood flow (circulation) to your feet and legs, which can cause your skin to:  Become thinner and drier.  Break more easily.  Heal more slowly.  Peel and crack. You may also have nerve damage (neuropathy) in your legs and feet, causing decreased feeling in them. This means that you may not notice minor injuries to your feet that could lead to more serious problems. Noticing and addressing any potential problems early is the best way to prevent future foot problems. How to care for your feet Foot hygiene  Wash your feet daily with warm water and mild soap. Do not use hot water. Then, pat your feet and the areas between your toes until they are completely dry. Do not soak your feet as this can dry your skin.  Trim your toenails straight across. Do not dig under them or around the cuticle. File the edges of your nails with an emery board or nail file.  Apply a moisturizing lotion or petroleum jelly to the skin on your feet and to dry, brittle toenails. Use lotion that does not contain alcohol and is unscented. Do not apply lotion between your toes. Shoes and socks  Wear clean socks or stockings every day. Make sure they are not too tight. Do not wear knee-high stockings since they may decrease blood flow to your legs.  Wear shoes that fit properly and have enough cushioning. Always look in your shoes before you put them on to be sure there are no objects inside.  To break in new shoes, wear them for just a few hours a day. This prevents injuries on your feet. Wounds, scrapes, corns, and calluses  Check your feet daily for blisters, cuts, bruises, sores, and redness. If you cannot see the bottom of your feet, use a mirror or ask someone for help.  Do not cut corns or calluses or try to remove them with medicine.  If you  find a minor scrape, cut, or break in the skin on your feet, keep it and the skin around it clean and dry. You may clean these areas with mild soap and water. Do not clean the area with peroxide, alcohol, or iodine.  If you have a wound, scrape, corn, or callus on your foot, look at it several times a day to make sure it is healing and not infected. Check for: ? Redness, swelling, or pain. ? Fluid or blood. ? Warmth. ? Pus or a bad smell. General instructions  Do not cross your legs. This may decrease blood flow to your feet.  Do not use heating pads or hot water bottles on your feet. They may burn your skin. If you have lost feeling in your feet or legs, you may not know this is happening until it is too late.  Protect your feet from hot and cold by wearing shoes, such as at the beach or on hot pavement.  Schedule a complete foot exam at least once a year (annually) or more often if you have foot problems. If you have foot problems, report any cuts, sores, or bruises to your health care provider immediately. Contact a health care provider if:  You have a medical condition that increases your risk of infection and you have any cuts, sores, or bruises on your feet.  You have an injury that is not   healing.  You have redness on your legs or feet.  You feel burning or tingling in your legs or feet.  You have pain or cramps in your legs and feet.  Your legs or feet are numb.  Your feet always feel cold.  You have pain around a toenail. Get help right away if:  You have a wound, scrape, corn, or callus on your foot and: ? You have pain, swelling, or redness that gets worse. ? You have fluid or blood coming from the wound, scrape, corn, or callus. ? Your wound, scrape, corn, or callus feels warm to the touch. ? You have pus or a bad smell coming from the wound, scrape, corn, or callus. ? You have a fever. ? You have a red line going up your leg. Summary  Check your feet every day  for cuts, sores, red spots, swelling, and blisters.  Moisturize feet and legs daily.  Wear shoes that fit properly and have enough cushioning.  If you have foot problems, report any cuts, sores, or bruises to your health care provider immediately.  Schedule a complete foot exam at least once a year (annually) or more often if you have foot problems. This information is not intended to replace advice given to you by your health care provider. Make sure you discuss any questions you have with your health care provider. Document Released: 03/21/2000 Document Revised: 05/06/2017 Document Reviewed: 04/25/2016 Elsevier Patient Education  2020 Elsevier Inc.  

## 2019-02-17 NOTE — Progress Notes (Signed)
Subjective: [name] is seen today for preventative diabetic foot care follow up painful, elongated, thickened toenails 1-5 b/l feet that she cannot cut. Pain interferes with daily activities. Aggravating factor includes wearing enclosed shoe gear and relieved with periodic debridement.  She voices no new pedal problems on today's visit.  Current Outpatient Medications on File Prior to Visit  Medication Sig  . cephALEXin (KEFLEX) 500 MG capsule Take 1 capsule (500 mg total) by mouth 2 (two) times daily.  Marland Kitchen EASY COMFORT PEN NEEDLES 31G X 5 MM MISC USE TWICE A DAY WITH INSULIN PEN  . glucose blood (FREESTYLE TEST STRIPS) test strip Use as instructed  . glucose monitoring kit (FREESTYLE) monitoring kit 1 each by Does not apply route as needed for other.  . ibuprofen (ADVIL,MOTRIN) 600 MG tablet Take 600 mg by mouth every 6 (six) hours as needed for headache or mild pain.   Marland Kitchen insulin glargine (LANTUS) 100 UNIT/ML injection Inject 0.12 mLs (12 Units total) into the skin daily.  . insulin NPH-regular Human (NOVOLIN 70/30) (70-30) 100 UNIT/ML injection Inject 20-36 Units into the skin 2 (two) times daily. INJECT 36 UNITS UNDER THE SKIN EVERY MORNING AND 20 UNITS UNDER THE SKIN EVERY EVENING (Patient not taking: Reported on 01/12/2019)  . Lancets (FREESTYLE) lancets Use as instructed  . metFORMIN (GLUCOPHAGE) 500 MG tablet Take 1 tablet (500 mg total) by mouth 2 (two) times daily with a meal. (Patient not taking: Reported on 01/12/2019)  . omeprazole (PRILOSEC) 2 mg/mL SUSP Take 10 mLs (20 mg total) by mouth daily. (Patient not taking: Reported on 01/12/2019)  . omeprazole (PRILOSEC) 20 MG capsule Take 1 capsule (20 mg total) by mouth daily.  . ondansetron (ZOFRAN) 4 MG tablet Take 1 tablet (4 mg total) by mouth every 6 (six) hours. (Patient not taking: Reported on 01/12/2019)  . senna (SENOKOT) 8.6 MG TABS tablet Take 1 tablet by mouth daily as needed for mild constipation.   .  SitaGLIPtin-MetFORMIN HCl (JANUMET XR) [phone] MG TB24 Take 1 tablet by mouth daily.   No current facility-administered medications on file prior to visit.      No Known Allergies   Objective:  Vascular Examination: Capillary refill time immediate x 10 digits.  Dorsalis pedis present b/l.  Posterior tibial pulses present b/l.  Digital hair absent b/l.  Skin temperature gradient WNL b/l.   Dermatological Examination: Skin with normal turgor, texture and tone b/l.  Toenails 1-5 b/l discolored, thick, dystrophic with subungual debris and pain with palpation to nailbeds due to thickness of nails.  Musculoskeletal: Muscle strength 5/5 to all LE muscle groups b/l.   HAV with bunion deformity b/l.   No pain, crepitus or joint limitation noted with ROM.   Neurological Examination: Protective sensation intact 5/5 with 10 gram monofilament bilaterally.  Epicritic sensation present bilaterally.  Vibratory sensation intact bilaterally.   Assessment: Painful onychomycosis toenails 1-5 b/l  NIDDM, uncontrolled  Plan: 1. Toenails 1-5 b/l were debrided in length and girth without iatrogenic bleeding. 2. Patient to continue soft, supportive shoe gear daily. 3. Patient to report any pedal injuries to medical professional immediately. 4. Follow up 3 months.  5. Patient/POA to call should there be a concern in the interim.

## 2019-03-11 ENCOUNTER — Other Ambulatory Visit: Payer: Self-pay

## 2019-03-11 DIAGNOSIS — Z20822 Contact with and (suspected) exposure to covid-19: Secondary | ICD-10-CM

## 2019-03-12 LAB — NOVEL CORONAVIRUS, NAA: SARS-CoV-2, NAA: NOT DETECTED

## 2019-05-17 ENCOUNTER — Ambulatory Visit: Payer: Medicaid Other | Admitting: Podiatry

## 2019-07-15 ENCOUNTER — Ambulatory Visit: Payer: Medicaid Other | Admitting: Podiatry

## 2019-10-17 ENCOUNTER — Ambulatory Visit: Payer: Medicaid Other | Admitting: Podiatry

## 2019-10-20 DIAGNOSIS — K219 Gastro-esophageal reflux disease without esophagitis: Secondary | ICD-10-CM | POA: Insufficient documentation

## 2020-01-23 ENCOUNTER — Ambulatory Visit (INDEPENDENT_AMBULATORY_CARE_PROVIDER_SITE_OTHER): Payer: Medicaid Other | Admitting: Podiatry

## 2020-01-23 ENCOUNTER — Encounter: Payer: Self-pay | Admitting: Podiatry

## 2020-01-23 ENCOUNTER — Other Ambulatory Visit: Payer: Self-pay

## 2020-01-23 DIAGNOSIS — M79675 Pain in left toe(s): Secondary | ICD-10-CM

## 2020-01-23 DIAGNOSIS — E119 Type 2 diabetes mellitus without complications: Secondary | ICD-10-CM

## 2020-01-23 DIAGNOSIS — B351 Tinea unguium: Secondary | ICD-10-CM | POA: Diagnosis not present

## 2020-01-23 DIAGNOSIS — E0865 Diabetes mellitus due to underlying condition with hyperglycemia: Secondary | ICD-10-CM

## 2020-01-23 DIAGNOSIS — M79674 Pain in right toe(s): Secondary | ICD-10-CM | POA: Diagnosis not present

## 2020-01-29 NOTE — Progress Notes (Signed)
ANNUAL DIABETIC FOOT EXAM  Subjective: [name] presents today for for annual diabetic foot examination and painful thick toenails that are difficult to trim. Pain interferes with ambulation. Aggravating factors include wearing enclosed shoe gear. Pain is relieved with periodic professional debridement..  Patient denies h/o foot wounds.  Patient denies symptoms of foot numbness.  Patient denies symptoms of foot tingling.  Patient denies symptoms of burning in feet.  Patient's bdid not check blood glucose this morning.  Patient, No Pcp Per is patient's PCP. Last visit was   Past Medical History:  Diagnosis Date  . Diabetes mellitus without complication ([place])   . 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 Ectrodactyly   . Genetic defect    genetic mutation of unknown significance per [facility] genetic notes  . GERD (gastroesophageal reflux disease)   . Hypercholesterolemia   . Hypertension   . Intellectual disability   . Microcephaly ([place])   . Pelvic mass   . Ventral hernia     Patient Active Problem List   Diagnosis Date Noted  . GERD (gastroesophageal reflux disease) 10/20/2019  . Intellectual disability 03/09/2018  . Microcephaly ([place]) 02/19/2017  . 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 Incarcerated ventral hernia 02/10/2017  . DM (diabetes mellitus) type 2, uncontrolled, with ketoacidosis ([place]) 02/10/2017  . Protein-calorie malnutrition, severe ([place]) 02/10/2017  . H/O pelvic mass   . DKA (diabetic ketoacidoses) 02/09/2017  . Hyponatremia 02/09/2017  . Hyperkalemia 02/09/2017  . Homelessness 02/09/2017  . Giant ovarian mass     Past Surgical History:  Procedure Laterality Date  . CHOLECYSTECTOMY    . HAND SURGERY Right   . HERNIA REPAIR      Current Outpatient Medications on File Prior to Visit  Medication Sig Dispense Refill  . cephALEXin (KEFLEX) 500 MG capsule Take 1 capsule (500 mg total) by mouth 2 (two) times daily. 14 capsule 0  . EASY COMFORT PEN NEEDLES  31G X 5 MM MISC USE TWICE A DAY WITH INSULIN PEN  3  . glucose blood (FREESTYLE TEST STRIPS) test strip Use as instructed 100 each 0  . glucose monitoring kit (FREESTYLE) monitoring kit 1 each by Does not apply route as needed for other. 1 each 0  . ibuprofen (ADVIL,MOTRIN) 600 MG tablet Take 600 mg by mouth every 6 (six) hours as needed for headache or mild pain.     Marland Kitchen insulin glargine (LANTUS) 100 UNIT/ML injection Inject 0.12 mLs (12 Units total) into the skin daily. 10 mL 2  . insulin NPH-regular Human (NOVOLIN 70/30) (70-30) 100 UNIT/ML injection Inject 20-36 Units into the skin 2 (two) times daily. INJECT 36 UNITS UNDER THE SKIN EVERY MORNING AND 20 UNITS UNDER THE SKIN EVERY EVENING (Patient not taking: Reported on 01/12/2019) 10 mL 0  . Lancets (FREESTYLE) lancets Use as instructed 100 each 0  . metFORMIN (GLUCOPHAGE) 500 MG tablet Take 1 tablet (500 mg total) by mouth 2 (two) times daily with a meal. (Patient not taking: Reported on 01/12/2019) 30 tablet 0  . omeprazole (PRILOSEC) 2 mg/mL SUSP Take 10 mLs (20 mg total) by mouth daily. (Patient not taking: Reported on 01/12/2019) 350 mL 1  . omeprazole (PRILOSEC) 20 MG capsule Take 1 capsule (20 mg total) by mouth daily. 30 capsule 2  . ondansetron (ZOFRAN) 4 MG tablet Take 1 tablet (4 mg total) by mouth every 6 (six) hours. (Patient not taking: Reported on 01/12/2019) 10 tablet 0  . senna (SENOKOT) 8.6 MG TABS tablet Take 1 tablet  by mouth daily as needed for mild constipation.     . SitaGLIPtin-MetFORMIN HCl (JANUMET XR) [phone] MG TB24 Take 1 tablet by mouth daily. 30 tablet 1   No current facility-administered medications on file prior to visit.     No Known Allergies  Social History   Occupational History  . Not on file  Tobacco Use  . Smoking status: Never Smoker  . Smokeless tobacco: Never Used  Vaping Use  . Vaping Use: Never used  Substance and Sexual Activity  . Alcohol use: No  . Drug use: No  . Sexual activity: Not on  file    Family History  Problem Relation Age of Onset  . Diabetes Mother   . GER disease Mother   . Diabetes Father     Immunization History  Administered Date(s) Administered  . Influenza,inj,Quad PF,6+ Mos 02/10/2017  . Pneumococcal Polysaccharide-23 02/10/2017     Objective: There were no vitals filed for this visit.  [name] is a pleasant 44 y.o. female in NAD. AAO X 3.  Vascular Examination: Capillary refill time to digits immediate b/l. Palpable pedal pulses b/l LE. Pedal hair absent. Lower extremity skin temperature gradient within normal limits. No pain with calf compression b/l. No edema noted b/l lower extremities.  Dermatological Examination: Pedal skin with normal turgor, texture and tone bilaterally. No open wounds bilaterally. No interdigital macerations bilaterally. Toenails 1-5 b/l elongated, discolored, dystrophic, thickened, crumbly with subungual debris and tenderness to dorsal palpation.  Musculoskeletal Examination: Normal muscle strength 5/5 to all lower extremity muscle groups bilaterally. No pain crepitus or joint limitation noted with ROM b/l. Hallux valgus with bunion deformity noted b/l lower extremities. Patient ambulates independent of any assistive aids.  Footwear Assessment: Does the patient wear appropriate shoes? Yes . Does the patient need inserts/orthotics? No.  Neurological Examination: Protective sensation intact 5/5 intact bilaterally with 10g monofilament b/l. Vibratory sensation intact b/l.  Assessment: 1. Pain due to onychomycosis of toenails of both feet   2. Diabetes mellitus due to underlying condition, uncontrolled, with hyperglycemia ([place])   3. Encounter for diabetic foot exam ([place])      ADA Risk Categorization: Low Risk :  Patient has all of the following: Intact protective sensation No prior foot ulcer  No severe deformity Pedal pulses present  Plan: -Examined patient. -No new findings. No new  orders. -Diabetic foot examination performed on today's visit. -Continue diabetic foot care principles. -Toenails 1-5 b/l were debrided in length and girth with sterile nail nippers and dremel without iatrogenic bleeding.  -Patient to report any pedal injuries to medical professional immediately. -Patient to continue soft, supportive shoe gear daily. -Patient/POA to call should there be question/concern in the interim.  No follow-ups on file.  Marzetta Board, DPM

## 2020-04-30 ENCOUNTER — Ambulatory Visit (INDEPENDENT_AMBULATORY_CARE_PROVIDER_SITE_OTHER): Payer: Medicaid Other | Admitting: Podiatry

## 2020-04-30 ENCOUNTER — Encounter: Payer: Self-pay | Admitting: Podiatry

## 2020-04-30 ENCOUNTER — Other Ambulatory Visit: Payer: Self-pay

## 2020-04-30 DIAGNOSIS — E0865 Diabetes mellitus due to underlying condition with hyperglycemia: Secondary | ICD-10-CM

## 2020-04-30 DIAGNOSIS — B351 Tinea unguium: Secondary | ICD-10-CM | POA: Diagnosis not present

## 2020-04-30 DIAGNOSIS — M79674 Pain in right toe(s): Secondary | ICD-10-CM | POA: Diagnosis not present

## 2020-04-30 DIAGNOSIS — M79675 Pain in left toe(s): Secondary | ICD-10-CM

## 2020-04-30 NOTE — Progress Notes (Signed)
Subjective: [name] presents today for preventative diabetic foot care and painful thick toenails that are difficult to trim. Pain interferes with ambulation. Aggravating factors include wearing enclosed shoe gear. Pain is relieved with periodic professional debridement. She voices no new pedal problems on today's visit.  She states her blood glucose was 127 mg/dl this morning.  PCP is [name] and last visit was in 2021. She cannot recall exact date.   Past Medical History:  Diagnosis Date  . Diabetes mellitus without complication ([place])   . 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 Ectrodactyly   . Genetic defect    genetic mutation of unknown significance per [facility] genetic notes  . GERD (gastroesophageal reflux disease)   . Hypercholesterolemia   . Hypertension   . Intellectual disability   . Microcephaly ([place])   . Pelvic mass   . Ventral hernia     Patient Active Problem List   Diagnosis Date Noted  . GERD (gastroesophageal reflux disease) 10/20/2019  . Intellectual disability 03/09/2018  . Microcephaly ([place]) 02/19/2017  . 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 Incarcerated ventral hernia 02/10/2017  . DM (diabetes mellitus) type 2, uncontrolled, with ketoacidosis ([place]) 02/10/2017  . Protein-calorie malnutrition, severe ([place]) 02/10/2017  . H/O pelvic mass   . DKA (diabetic ketoacidoses) 02/09/2017  . Hyponatremia 02/09/2017  . Hyperkalemia 02/09/2017  . Homelessness 02/09/2017  . Giant ovarian mass     Past Surgical History:  Procedure Laterality Date  . CHOLECYSTECTOMY    . HAND SURGERY Right   . HERNIA REPAIR      Current Outpatient Medications on File Prior to Visit  Medication Sig Dispense Refill  . amoxicillin (AMOXIL) 500 MG tablet Take 500 mg by mouth 3 (three) times daily.    . cephALEXin (KEFLEX) 500 MG capsule Take 1 capsule (500 mg total) by mouth 2 (two) times daily. 14 capsule 0  . EASY COMFORT PEN NEEDLES 31G X 5 MM MISC USE TWICE A DAY WITH  INSULIN PEN  3  . glucose blood (FREESTYLE TEST STRIPS) test strip Use as instructed 100 each 0  . glucose monitoring kit (FREESTYLE) monitoring kit 1 each by Does not apply route as needed for other. 1 each 0  . ibuprofen (ADVIL,MOTRIN) 600 MG tablet Take 600 mg by mouth every 6 (six) hours as needed for headache or mild pain.     Marland Kitchen insulin glargine (LANTUS) 100 UNIT/ML injection Inject 0.12 mLs (12 Units total) into the skin daily. 10 mL 2  . insulin NPH-regular Human (NOVOLIN 70/30) (70-30) 100 UNIT/ML injection Inject 20-36 Units into the skin 2 (two) times daily. INJECT 36 UNITS UNDER THE SKIN EVERY MORNING AND 20 UNITS UNDER THE SKIN EVERY EVENING (Patient not taking: Reported on 01/12/2019) 10 mL 0  . Insulin Syringe-Needle U-100 (INSULIN SYRINGE .5CC/31GX5/16") 31G X 5/16" 0.5 ML MISC Inject 1 Syringe into the skin daily.    . Lancets (FREESTYLE) lancets Use as instructed 100 each 0  . metFORMIN (GLUCOPHAGE) 500 MG tablet Take 1 tablet (500 mg total) by mouth 2 (two) times daily with a meal. (Patient not taking: Reported on 01/12/2019) 30 tablet 0  . nitrofurantoin, macrocrystal-monohydrate, (MACROBID) 100 MG capsule Take 100 mg by mouth 2 (two) times daily.    Marland Kitchen omeprazole (PRILOSEC) 2 mg/mL SUSP Take 10 mLs (20 mg total) by mouth daily. (Patient not taking: Reported on 01/12/2019) 350 mL 1  . omeprazole (PRILOSEC) 20 MG capsule Take 1 capsule (20 mg total) by mouth daily.  30 capsule 2  . ondansetron (ZOFRAN) 4 MG tablet Take 1 tablet (4 mg total) by mouth every 6 (six) hours. (Patient not taking: Reported on 01/12/2019) 10 tablet 0  . senna (SENOKOT) 8.6 MG TABS tablet Take 1 tablet by mouth daily as needed for mild constipation.     . SitaGLIPtin-MetFORMIN HCl (JANUMET XR) [phone] MG TB24 Take 1 tablet by mouth daily. 30 tablet 1   No current facility-administered medications on file prior to visit.     No Known Allergies  Social History   Occupational History  . Not on file  Tobacco  Use  . Smoking status: Never Smoker  . Smokeless tobacco: Never Used  Vaping Use  . Vaping Use: Never used  Substance and Sexual Activity  . Alcohol use: No  . Drug use: No  . Sexual activity: Not on file    Family History  Problem Relation Age of Onset  . Diabetes Mother   . GER disease Mother   . Diabetes Father     Immunization History  Administered Date(s) Administered  . Influenza,inj,Quad PF,6+ Mos 02/10/2017  . Pneumococcal Polysaccharide-23 02/10/2017     Objective: There were no vitals filed for this visit.  [name] is a pleasant 45 y.o. female in NAD. AAO X 3.  Vascular Examination: Capillary refill time to digits immediate b/l. Palpable pedal pulses b/l LE. Pedal hair absent. Lower extremity skin temperature gradient within normal limits. No pain with calf compression b/l. No edema noted b/l lower extremities.  Dermatological Examination: Pedal skin with normal turgor, texture and tone bilaterally. No open wounds bilaterally. No interdigital macerations bilaterally. Toenails 1-5 b/l elongated, discolored, dystrophic, thickened, crumbly with subungual debris and tenderness to dorsal palpation.  Musculoskeletal Examination: Normal muscle strength 5/5 to all lower extremity muscle groups bilaterally. No pain crepitus or joint limitation noted with ROM b/l. Hallux valgus with bunion deformity noted b/l lower extremities. Patient ambulates independent of any assistive aids.  Neurological Examination: Protective sensation intact 5/5 intact bilaterally with 10g monofilament b/l. Vibratory sensation intact b/l.  Assessment: 1. Pain due to onychomycosis of toenails of both feet   2. Diabetes mellitus due to underlying condition, uncontrolled, with hyperglycemia ([place])    Plan: -Examined patient. -No new findings. No new orders. -Continue diabetic foot care principles. -Toenails 1-5 b/l were debrided in length and girth with sterile nail nippers and dremel  without iatrogenic bleeding.  -Patient to report any pedal injuries to medical professional immediately. -Patient/POA to call should there be question/concern in the interim.  Return in about 3 months (around 07/29/2020).  Marzetta Board, DPM

## 2020-08-10 ENCOUNTER — Ambulatory Visit: Payer: Medicaid Other | Admitting: Podiatry

## 2020-08-17 ENCOUNTER — Ambulatory Visit: Payer: Medicaid Other | Admitting: Podiatry

## 2020-09-28 DIAGNOSIS — R079 Chest pain, unspecified: Secondary | ICD-10-CM | POA: Insufficient documentation

## 2020-10-31 ENCOUNTER — Other Ambulatory Visit: Payer: Self-pay

## 2020-10-31 ENCOUNTER — Ambulatory Visit: Payer: Medicaid Other | Admitting: Podiatry

## 2020-10-31 ENCOUNTER — Encounter: Payer: Self-pay | Admitting: Podiatry

## 2020-10-31 DIAGNOSIS — M79674 Pain in right toe(s): Secondary | ICD-10-CM | POA: Diagnosis not present

## 2020-10-31 DIAGNOSIS — E0865 Diabetes mellitus due to underlying condition with hyperglycemia: Secondary | ICD-10-CM

## 2020-10-31 DIAGNOSIS — B351 Tinea unguium: Secondary | ICD-10-CM | POA: Diagnosis not present

## 2020-10-31 DIAGNOSIS — S91332A Puncture wound without foreign body, left foot, initial encounter: Secondary | ICD-10-CM

## 2020-10-31 DIAGNOSIS — L601 Onycholysis: Secondary | ICD-10-CM

## 2020-10-31 DIAGNOSIS — Z9181 History of falling: Secondary | ICD-10-CM

## 2020-10-31 DIAGNOSIS — M79675 Pain in left toe(s): Secondary | ICD-10-CM | POA: Diagnosis not present

## 2020-10-31 MED ORDER — AMOXICILLIN 500 MG PO CAPS: 500.0000 mg | ORAL_CAPSULE | Freq: Three times a day (TID) | ORAL | 0 refills | Status: AC

## 2020-10-31 NOTE — Progress Notes (Signed)
Subjective:  Patient ID: [name], female    DOB: 03/25/1976,  MRN: [id number]  45 y.o. female presents with preventative diabetic foot care and painful thick toenails that are difficult to trim. Pain interferes with ambulation. Aggravating factors include wearing enclosed shoe gear. Pain is relieved with periodic professional debridement..    Patient's blood sugar was 173 mg/dl today.  Patient states she had two falls, both about two months ago. In one fall, she injured her left great toe and states the nail is very loose. She denies any redness, drainage, bruising or drainage from digit.  In a second fall, she scraped the back of her heel and points to the posterior aspect of the heel. She denies any redness, drainage, swelling or bruising of area. She states she falls tripping over her shopping bags.  She also states she was walking in the dark in her bedroom and stepped on an earring stud. It did penetrate the skin, but she immediately pulled it out. She states she hasn't had a tetanus since the late 1990's.  PCP: [name], NP and last visit was: 2021.  Review of Systems: Negative except as noted in the HPI.   No Known Allergies  Objective:  There were no vitals filed for this visit. Constitutional Patient is a pleasant 45 y.o. African American female WD, WN in NAD. AAO x 3.  Vascular Capillary refill time to digits immediate b/l. Palpable DP pulse(s) b/l lower extremities Palpable PT pulse(s) b/l lower extremities Pedal hair absent. Lower extremity skin temperature gradient within normal limits. No pain with calf compression b/l. No edema noted b/l lower extremities. No cyanosis or clubbing noted.  Neurologic Normal speech. Protective sensation intact 5/5 intact bilaterally with 10g monofilament b/l.  Dermatologic Pedal skin with normal turgor, texture and tone b/l lower extremities. No interdigital macerations b/l lower extremities. Toenails 2-5 bilaterally and R  hallux elongated, discolored, dystrophic, thickened, and crumbly with subungual debris and tenderness to dorsal palpation. There is noted onycholysis of entire nailplate of left hallux. There is no underlying abscess, hematoma nor seroma. Nail is barely attached at the proximal nailfold. No active bleeding. She also has healing abrasion posterior aspect of left heel at Achilles cord. No surrounding erythema, no edema, no drainag, no fluctuance. On the plantar aspect of the left, foot, she has a pinpoint erythematous area consistent with puncture wound. No active bleeding. No surrounding erythema, no palpable collection of fluid, no wamrth.  Orthopedic: Normal muscle strength 5/5 to all lower extremity muscle groups bilaterally. No pain crepitus or joint limitation noted with ROM b/l. Hallux valgus with bunion deformity noted b/l lower extremities.     Assessment:   1. Pain due to onychomycosis of toenails of both feet   2. Puncture wound of left foot, initial encounter   3. Onycholysis of toenail   4. History of fall within past 90 days   5. Diabetes mellitus due to underlying condition, uncontrolled, with hyperglycemia ([place])    Plan:  Patient was evaluated and treated and all questions answered.  Onychomycosis with pain -Nails palliatively debridement as below. -Educated on self-care  Procedure: Nail Debridement Rationale: Pain Type of Debridement: manual, sharp debridement. Instrumentation: Nail nipper, rotary burr. Number of Nails: 10  -Examined patient. -Patient to call PCP's office and update her tetanus. Last dose late 1990's per patient recall. -Patient instructed to cleanse left foot puncture wound with soap and water. Apply Nesoporin to area once daily. Left foot abrasion posterior heel is nearly healed.  Monitor for any changes. Call office if she has any problems. -Patient to continue soft, supportive shoe gear daily. -Loose nailplate left hallux gently removed from it's  remaining attachment to the nail border. Cleansed with alcohol. Triple antibiotic ointment and band-aid applied. She may remove band-aid on tomorrow. No further dressings required as nailbed is epithelialized-Toenails 2-5 b/l and right hallux were debrided in length and girth with sterile nail nippers and dremel without iatrogenic bleeding.  -Patient to report any pedal injuries to medical professional immediately. -Rx for Amoxicillin 500 mg, #30, to be taken three times daily for 10 days. -Patient/POA to call should there be question/concern in the interim.  Return in about 3 months (around 01/31/2021).  Marzetta Board, DPM

## 2020-10-31 NOTE — Patient Instructions (Addendum)
Contact your PCP to update your tetanus status.  Clean left foot with dial soap. Dry area well. Apply Neosporin to area once daily. Contact office if you experience any redness, swelling, drainage, fever, chills, night sweats, nausea, vomiting or increased pain of left foot.

## 2021-02-01 ENCOUNTER — Ambulatory Visit: Payer: Medicaid Other | Admitting: Podiatry

## 2021-02-08 ENCOUNTER — Encounter: Payer: Self-pay | Admitting: Podiatry

## 2021-02-08 ENCOUNTER — Other Ambulatory Visit: Payer: Self-pay

## 2021-02-08 ENCOUNTER — Ambulatory Visit (INDEPENDENT_AMBULATORY_CARE_PROVIDER_SITE_OTHER): Payer: Medicaid Other | Admitting: Podiatry

## 2021-02-08 DIAGNOSIS — M79674 Pain in right toe(s): Secondary | ICD-10-CM | POA: Diagnosis not present

## 2021-02-08 DIAGNOSIS — B351 Tinea unguium: Secondary | ICD-10-CM

## 2021-02-08 DIAGNOSIS — M79675 Pain in left toe(s): Secondary | ICD-10-CM

## 2021-02-08 DIAGNOSIS — E0865 Diabetes mellitus due to underlying condition with hyperglycemia: Secondary | ICD-10-CM

## 2021-02-12 NOTE — Progress Notes (Signed)
Subjective: [name] is a 45 y.o. female patient seen today for follow up of preventative diabetic foot care. She is seen for complaint of painful thick toenails that are difficult to trim. Pain interferes with ambulation. Aggravating factors include wearing enclosed shoe gear. Pain is relieved with periodic professional debridement.  New problems reported today: None.  PCP is [name], NP. Last visit was: August, 2022.  No Known Allergies  Objective: Physical Exam  General: Patient is a pleasant 45 y.o. African American female WD, WN in NAD. AAO x 3.   Neurovascular Examination: CFT immediate b/l LE. Faintly palpable DP/PT pulses b/l LE. Digital hair absent b/l. Skin temperature gradient WNL b/l. No pain with calf compression b/l. No edema noted b/l. Lower extremity skin temperature gradient within normal limits.  Protective sensation intact 5/5 intact bilaterally with 10g monofilament b/l.  Dermatological:  Pedal integument with normal turgor, texture and tone b/l LE. No open wounds b/l. No interdigital macerations b/l. Toenails 2-5 bilaterally and R hallux elongated, thickened, discolored with subungual debris. +Tenderness with dorsal palpation of nailplates. No hyperkeratotic or porokeratotic lesions present. Toenails 1-5 b/l elongated, discolored, dystrophic, thickened, crumbly with subungual debris and tenderness to dorsal palpation.  Musculoskeletal:  Normal muscle strength 5/5 to all lower extremity muscle groups bilaterally. HAV with bunion deformity noted b/l LE. Patient ambulates independent of any assistive aids.  Assessment: 1. Pain due to onychomycosis of toenails of both feet   2. Diabetes mellitus due to underlying condition, uncontrolled, with hyperglycemia ([place])    Plan: Patient was evaluated and treated and all questions answered. Consent given for treatment as described below: -No new findings. No new orders. -Her left great toe nail is growing  back in nicely. -Mycotic toenails x 10 were debrided in length and girth with sterile nail nippers and dremel without iatrogenic bleeding. -Patient/POA to call should there be question/concern in the interim.  Return in about 3 months (around 05/11/2021).  Marzetta Board, DPM

## 2021-05-15 ENCOUNTER — Ambulatory Visit (INDEPENDENT_AMBULATORY_CARE_PROVIDER_SITE_OTHER): Payer: Medicaid Other

## 2021-05-15 ENCOUNTER — Other Ambulatory Visit: Payer: Self-pay | Admitting: Podiatry

## 2021-05-15 ENCOUNTER — Encounter: Payer: Self-pay | Admitting: Podiatry

## 2021-05-15 ENCOUNTER — Other Ambulatory Visit: Payer: Self-pay

## 2021-05-15 ENCOUNTER — Ambulatory Visit: Payer: Medicaid Other | Admitting: Podiatry

## 2021-05-15 DIAGNOSIS — M79675 Pain in left toe(s): Secondary | ICD-10-CM

## 2021-05-15 DIAGNOSIS — M2012 Hallux valgus (acquired), left foot: Secondary | ICD-10-CM | POA: Diagnosis not present

## 2021-05-15 DIAGNOSIS — L97309 Non-pressure chronic ulcer of unspecified ankle with unspecified severity: Secondary | ICD-10-CM

## 2021-05-15 DIAGNOSIS — L97319 Non-pressure chronic ulcer of right ankle with unspecified severity: Secondary | ICD-10-CM

## 2021-05-15 DIAGNOSIS — E0865 Diabetes mellitus due to underlying condition with hyperglycemia: Secondary | ICD-10-CM

## 2021-05-15 DIAGNOSIS — M79674 Pain in right toe(s): Secondary | ICD-10-CM

## 2021-05-15 DIAGNOSIS — E119 Type 2 diabetes mellitus without complications: Secondary | ICD-10-CM

## 2021-05-15 DIAGNOSIS — R52 Pain, unspecified: Secondary | ICD-10-CM

## 2021-05-15 DIAGNOSIS — M2011 Hallux valgus (acquired), right foot: Secondary | ICD-10-CM | POA: Diagnosis not present

## 2021-05-15 DIAGNOSIS — E11622 Type 2 diabetes mellitus with other skin ulcer: Secondary | ICD-10-CM

## 2021-05-15 DIAGNOSIS — B351 Tinea unguium: Secondary | ICD-10-CM | POA: Diagnosis not present

## 2021-05-15 DIAGNOSIS — L97329 Non-pressure chronic ulcer of left ankle with unspecified severity: Secondary | ICD-10-CM

## 2021-05-15 MED ORDER — SILVER SULFADIAZINE 1 % EX CREA: 1.0000 "application " | TOPICAL_CREAM | Freq: Every day | CUTANEOUS | 0 refills | Status: DC

## 2021-05-15 NOTE — Progress Notes (Signed)
ANNUAL DIABETIC FOOT EXAM  Subjective: [name] presents today for for annual diabetic foot examination.  Patient relates 17 year h/o diabetes.  Patient relates wound development on the back of both of her ankles from wearing ill-fitting boots. They have been present x one month. Denies any redness, drainage or swelling. Denies any fever, chills, night sweats, nausea or vomiting. She has been applying hydrogen peroxide. Photos did not transfer over in [place].  Patient denies any numbness, tingling, burning, or pins/needle sensation in feet.  Risk factors: uncontrolled diabetes, HTN.  [name], NP is patient's PCP. Last visit was August, 2022.  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[place])    Ectrodactyly    Genetic defect    genetic mutation of unknown significance per [facility] genetic notes   GERD (gastroesophageal reflux disease)    Hypercholesterolemia    Hypertension    Intellectual disability    Microcephaly ([place])    Pelvic mass    Ventral hernia    Patient Active Problem List   Diagnosis Date Noted   Chest pain 09/28/2020   GERD (gastroesophageal reflux disease) 10/20/2019   Intellectual disability 03/09/2018   Microcephaly ([place]) 02/19/2017   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   Incarcerated ventral hernia 02/10/2017   DM (diabetes mellitus) type 2, uncontrolled, with ketoacidosis ([place]) 02/10/2017   Protein-calorie malnutrition, severe ([place]) 02/10/2017   H/O pelvic mass    DKA (diabetic ketoacidoses) 02/09/2017   Hyponatremia 02/09/2017   Hyperkalemia 02/09/2017   Homelessness 02/09/2017   Giant ovarian mass    Past Surgical History:  Procedure Laterality Date   CHOLECYSTECTOMY     HAND SURGERY Right    HERNIA REPAIR     Current Outpatient Medications on File Prior to Visit  Medication Sig Dispense Refill   aspirin 81 MG EC tablet Take 1 tablet by mouth daily.     ASPIRIN LOW  DOSE 81 MG EC tablet Take 81 mg by mouth daily.     EASY COMFORT PEN NEEDLES 31G X 5 MM MISC USE TWICE A DAY WITH INSULIN PEN  3   ferrous sulfate 220 (44 Fe) MG/5ML solution Take by mouth.     glucose blood (FREESTYLE TEST STRIPS) test strip Use as instructed 100 each 0   glucose monitoring kit (FREESTYLE) monitoring kit 1 each by Does not apply route as needed for other. 1 each 0   ibuprofen (ADVIL,MOTRIN) 600 MG tablet Take 600 mg by mouth every 6 (six) hours as needed for headache or mild pain.      insulin glargine (LANTUS) 100 UNIT/ML injection Inject 0.12 mLs (12 Units total) into the skin daily. 10 mL 2   insulin NPH-regular Human (NOVOLIN 70/30) (70-30) 100 UNIT/ML injection Inject 20-36 Units into the skin 2 (two) times daily. INJECT 36 UNITS UNDER THE SKIN EVERY MORNING AND 20 UNITS UNDER THE SKIN EVERY EVENING (Patient not taking: Reported on 01/12/2019) 10 mL 0   Insulin Syringe-Needle U-100 (INSULIN SYRINGE .5CC/31GX5/16") 31G X 5/16" 0.5 ML MISC Inject 1 Syringe into the skin daily.     Lancets (FREESTYLE) lancets Use as instructed 100 each 0   metFORMIN (GLUCOPHAGE) 500 MG tablet Take 1 tablet (500 mg total) by mouth 2 (two) times daily with a meal. (Patient not taking: Reported on 01/12/2019) 30 tablet 0   omeprazole (PRILOSEC) 2 mg/mL SUSP Take 10 mLs (20 mg total) by mouth daily. (Patient not taking: Reported on  01/12/2019) 350 mL 1   omeprazole (PRILOSEC) 20 MG capsule Take 1 capsule (20 mg total) by mouth daily. 30 capsule 2   ondansetron (ZOFRAN) 4 MG tablet Take 1 tablet (4 mg total) by mouth every 6 (six) hours. (Patient not taking: Reported on 01/12/2019) 10 tablet 0   pravastatin (PRAVACHOL) 20 MG tablet Take by mouth.     senna (SENOKOT) 8.6 MG TABS tablet Take 1 tablet by mouth daily as needed for mild constipation.      SitaGLIPtin-MetFORMIN HCl (JANUMET XR) [phone] MG TB24 Take 1 tablet by mouth daily. 30 tablet 1   No current facility-administered medications on file  prior to visit.    No Known Allergies Social History   Occupational History   Not on file  Tobacco Use   Smoking status: Never   Smokeless tobacco: Never  Vaping Use   Vaping Use: Never used  Substance and Sexual Activity   Alcohol use: No   Drug use: No   Sexual activity: Not on file   Family History  Problem Relation Age of Onset   Diabetes Mother    GER disease Mother    Diabetes Father    Immunization History  Administered Date(s) Administered   Influenza,inj,Quad PF,6+ Mos 02/10/2017   Pneumococcal Polysaccharide-23 02/10/2017     Review of Systems: Negative except as noted in the HPI.   Objective: There were no vitals filed for this visit.  [name] is a pleasant 46 y.o. female in NAD. AAO X 3.  Vascular Examination: CFT <3 seconds b/l LE. Faintly palpable pedal pulses b/l. Pedal hair absent. No pain with calf compression b/l. No edema noted b/l LE. No cyanosis or clubbing noted b/l LE.  Dermatological Examination: Toenails 1-5 b/l elongated, discolored, dystrophic, thickened, crumbly with subungual debris and tenderness to dorsal palpation.  Musculoskeletal Examination: Muscle strength 5/5 to all lower extremity muscle groups bilaterally. HAV with bunion deformity noted b/l LE. Patient ambulates independent of any assistive aids.  Footwear Assessment: Does the patient wear appropriate shoes? No. Does the patient need inserts/orthotics? Yes.  Neurological Examination: Protective sensation intact 5/5 intact bilaterally with 10g monofilament b/l. Vibratory sensation intact b/l.  Xray findings bilateral ankles: No gas in tissues b/l lower extremities. No bone erosion noted at location of ulceration b/l lower extremities. No evidence of fracture b/l lower extremities. No foreign body evident b/l lower extremities.   Assessment: 1. Pain due to onychomycosis of toenails of both feet   2. 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 Diabetic ulcer of  right ankle ([facility])   4. Hallux valgus, acquired, bilateral   5. Diabetes mellitus due to underlying condition, uncontrolled, with hyperglycemia ([place])   6. Encounter for diabetic foot exam ([place])     ADA Risk Categorization: High Risk  Patient has one or more of the following: Loss of protective sensation Absent pedal pulses Severe Foot deformity History of foot ulcer  Plan: -Examined patient. -Patient was evaluated and treated and all questions answered. Advised patient to discontinue wearing shoes that caused rubbing on posterior aspect of her heels. She related understanding. -Patient/POA/Family member educated on diagnosis and treatment plan of routine ulcer debridement/wound care.  -Patient risk factors affecting healing of ulcer: uncontrolled diabetes -Claretta Fraise given written instructions on daily wound care for bilateral ankles ulceration. -Dispensed surgical shoes for both lower extremities. -Ulcer posterior left ankle cleansed with Wound Cleanser. Silvadene Cream and sterile dressing applied.. -Rx sent for Silvadene Cream. -Mycotic toenails 1-5 bilaterally  were debrided in length and girth with sterile nail nippers and dremel without incident. -Xray of bilateral ankles were performed and reviewed with patient and/or POA. -Patient/POA to call should there be question/concern in the interim.  Return in about 1 week (around 05/22/2021).  Marzetta Board, DPM

## 2021-05-15 NOTE — Patient Instructions (Signed)
DRESSING CHANGES bilateral ankles:   PHARMACY SHOPPING LIST: Saline or Wound Cleanser for cleaning wound 2 x 2 inch sterile gauze for cleaning wound silvadene cream  A. IF DISPENSED, WEAR SURGICAL SHOE OR WALKING BOOT AT ALL TIMES.  B. IF PRESCRIBED ORAL ANTIBIOTICS, TAKE ALL MEDICATION AS PRESCRIBED UNTIL ALL ARE GONE.  C. IF DOCTOR HAS DESIGNATED NONWEIGHTBEARING STATUS, PLEASE ADHERE TO INSTRUCTIONS.  1. KEEP b/l lower extremities DRY AT ALL TIMES!!!!  2. CLEANSE ULCER WITH SALINE OR WOUND CLEANSER.  3. DAB DRY WITH GAUZE SPONGE.  4. APPLY A LIGHT AMOUNT OF silvadene cream TO BASE OF ULCER.  5. APPLY OUTER DRESSING AS INSTRUCTED.  6. WEAR SURGICAL SHOE/BOOT DAILY AT ALL TIMES. IF SUPPLIED, WEAR HEEL PROTECTORS AT ALL TIMES WHEN IN BED.  7. DO NOT WALK BAREFOOT!!!  8.  IF YOU EXPERIENCE ANY FEVER, CHILLS, NIGHTSWEATS, NAUSEA OR VOMITING, ELEVATED OR LOW BLOOD SUGARS, REPORT TO EMERGENCY ROOM.  9. IF YOU EXPERIENCE INCREASED REDNESS, PAIN, SWELLING, DISCOLORATION, ODOR, PUS, DRAINAGE OR WARMTH OF YOUR FOOT, REPORT TO EMERGENCY ROOM.

## 2021-05-23 ENCOUNTER — Other Ambulatory Visit: Payer: Self-pay

## 2021-05-23 ENCOUNTER — Ambulatory Visit (INDEPENDENT_AMBULATORY_CARE_PROVIDER_SITE_OTHER): Payer: Medicaid Other | Admitting: Podiatry

## 2021-05-23 DIAGNOSIS — L97319 Non-pressure chronic ulcer of right ankle with unspecified severity: Secondary | ICD-10-CM | POA: Diagnosis not present

## 2021-05-23 DIAGNOSIS — L97329 Non-pressure chronic ulcer of left ankle with unspecified severity: Secondary | ICD-10-CM

## 2021-05-23 DIAGNOSIS — E11622 Type 2 diabetes mellitus with other skin ulcer: Secondary | ICD-10-CM

## 2021-05-23 NOTE — Patient Instructions (Signed)
Continue using the silvadene cream daily with bandages. Do not wear shoes that put pressure here

## 2021-05-27 NOTE — Progress Notes (Signed)
Subjective:  Patient ID: [name], female    DOB: 1975-12-13,  MRN: [id number]  Chief Complaint  Patient presents with   Diabetic Ulcer       left ankle ulcer 1 wk f/u    46 y.o. female presents with the above complaint. History confirmed with patient.  She was referred to me by Dr. Elisha Ponder for evaluation of ulcerations on the posterior Achilles bilateral she thinks it was caused by a bad pair of shoes that rubbed on her ankles.  She has been using Silvadene ointment at home.  Objective:  Physical Exam: warm, good capillary refill, no trophic changes or ulcerative lesions, normal DP and PT pulses, normal sensory exam, and left posterior Achilles ulceration measuring 9 x 6 x 1 mm, right posterior Achilles ulceration measuring 7 x 6 x 1 mm.  No signs of infection.  Subcutaneous tissue is exposed.  Significant hyperkeratosis around this..      Assessment:   1. 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 Diabetic ulcer of right ankle ([place])      Plan:  Patient was evaluated and treated and all questions answered.  Seems to be healing well.  No signs of infection.  Continue Silvadene ointment daily.  Debridement is not required today.  I did order lab work on her to evaluate for her A1c CBC and ESR.  Elevated A1c may be reason she has this and has not improved.  Return in about 4 weeks (around 06/20/2021).

## 2021-06-20 ENCOUNTER — Ambulatory Visit: Payer: Medicaid Other | Admitting: Podiatry

## 2021-06-25 ENCOUNTER — Ambulatory Visit: Payer: Medicaid Other | Admitting: Podiatry

## 2021-07-03 ENCOUNTER — Encounter: Payer: Self-pay | Admitting: Physician Assistant

## 2021-07-03 ENCOUNTER — Other Ambulatory Visit: Payer: Self-pay | Admitting: Critical Care Medicine

## 2021-07-03 ENCOUNTER — Other Ambulatory Visit (HOSPITAL_COMMUNITY): Payer: Self-pay

## 2021-07-03 DIAGNOSIS — E11622 Type 2 diabetes mellitus with other skin ulcer: Secondary | ICD-10-CM

## 2021-07-03 MED ORDER — ACCU-CHEK GUIDE VI STRP
ORAL_STRIP | 12 refills | Status: DC
  Filled 2021-07-03: qty 100, fill #0

## 2021-07-03 MED ORDER — ACCU-CHEK GUIDE VI STRP
ORAL_STRIP | Freq: Three times a day (TID) | 12 refills | Status: AC
  Filled 2021-07-03: qty 100, 33d supply, fill #0

## 2021-07-03 MED ORDER — INSULIN GLARGINE 100 UNIT/ML ~~LOC~~ SOLN
SUBCUTANEOUS | 2 refills | Status: DC
  Filled 2021-07-03: qty 10, 20d supply, fill #0

## 2021-07-03 MED ORDER — ACCU-CHEK SOFTCLIX LANCETS MISC
12 refills | Status: DC
  Filled 2021-07-03: qty 100, fill #0

## 2021-07-03 MED ORDER — ACCU-CHEK GUIDE W/DEVICE KIT
PACK | 0 refills | Status: DC
  Filled 2021-07-03: qty 1, fill #0

## 2021-07-03 MED ORDER — ACCU-CHEK GUIDE W/DEVICE KIT
PACK | Freq: Three times a day (TID) | 0 refills | Status: DC
  Filled 2021-07-03: qty 1, 1d supply, fill #0

## 2021-07-03 MED ORDER — SILVER SULFADIAZINE 1 % EX CREA
1.0000 "application " | TOPICAL_CREAM | Freq: Every day | CUTANEOUS | 0 refills | Status: DC
  Filled 2021-07-03: qty 400, 30d supply, fill #0

## 2021-07-03 MED ORDER — ACCU-CHEK SOFTCLIX LANCETS MISC
Freq: Three times a day (TID) | 12 refills | Status: DC
  Filled 2021-07-03: qty 100, 33d supply, fill #0

## 2021-07-03 MED ORDER — INSULIN SYRINGE-NEEDLE U-100 30G X 5/16" 0.5 ML MISC
Freq: Two times a day (BID) | 0 refills | Status: DC
Start: 2021-07-03 — End: 2022-10-22
  Filled 2021-07-03: qty 100, fill #0
  Filled 2021-07-03: qty 100, 50d supply, fill #0
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
## 2021-07-04 ENCOUNTER — Other Ambulatory Visit (HOSPITAL_COMMUNITY): Payer: Self-pay

## 2021-07-15 ENCOUNTER — Ambulatory Visit: Payer: Medicaid Other | Admitting: Podiatry

## 2021-07-16 ENCOUNTER — Ambulatory Visit (INDEPENDENT_AMBULATORY_CARE_PROVIDER_SITE_OTHER): Payer: Medicaid Other | Admitting: Podiatry

## 2021-07-16 DIAGNOSIS — E11622 Type 2 diabetes mellitus with other skin ulcer: Secondary | ICD-10-CM

## 2021-07-16 DIAGNOSIS — L97329 Non-pressure chronic ulcer of left ankle with unspecified severity: Secondary | ICD-10-CM | POA: Diagnosis not present

## 2021-07-16 DIAGNOSIS — L97319 Non-pressure chronic ulcer of right ankle with unspecified severity: Secondary | ICD-10-CM | POA: Diagnosis not present

## 2021-07-17 ENCOUNTER — Ambulatory Visit: Payer: Medicaid Other

## 2021-07-17 NOTE — Progress Notes (Signed)
Patient seen for evaluation for diabetic shoes and insoles. Informed patient that medicaid will not cover durable medical equipment for anyone over the age of 56. Patient said she did not have any other insurance or payment method and elected not to proceed at this time. All questions answered and concerns addressed. Patient to reach back out to resume plan of care at their discretion. ?

## 2021-07-18 NOTE — Progress Notes (Signed)
?  Subjective:  ?Patient ID: [name], female    DOB: October 16, 1975,  MRN: [id number] ? ?Chief Complaint  ?Patient presents with  ? Diabetic Ulcer  ?     left ankle ulcer 1 wk f/u  ? ? ?46 y.o. female presents with the above complaint. History confirmed with patient.  She returns today for follow-up.  Feels like they are doing well ? ?Objective:  ?Physical Exam: ?warm, good capillary refill, no trophic changes or ulcerative lesions, normal DP and PT pulses, normal sensory exam, and bilateral Achilles ulcerations have completely healed ? ? ? ? ? ?Assessment:  ? ?1. 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 Diabetic ulcer of right ankle ([place])   ? ? ? ?Plan:  ?Patient was evaluated and treated and all questions answered. ? ?Overall doing well.  She did not complete her lab work.  I advised her on risk of recurrence.  She will return to see me on an as-needed basis. ? ?Return if symptoms worsen or fail to improve.  ? ?

## 2021-07-24 ENCOUNTER — Encounter: Payer: Self-pay | Admitting: Physician Assistant

## 2021-07-24 ENCOUNTER — Other Ambulatory Visit: Payer: Self-pay | Admitting: Critical Care Medicine

## 2021-07-24 ENCOUNTER — Other Ambulatory Visit (HOSPITAL_COMMUNITY): Payer: Self-pay

## 2021-07-24 DIAGNOSIS — E1110 Type 2 diabetes mellitus with ketoacidosis without coma: Secondary | ICD-10-CM

## 2021-07-24 DIAGNOSIS — L97529 Non-pressure chronic ulcer of other part of left foot with unspecified severity: Secondary | ICD-10-CM

## 2021-07-24 MED ORDER — ACCU-CHEK SOFTCLIX LANCET DEV KIT
PACK | 0 refills | Status: DC
  Filled 2021-07-24 (×2): qty 1, 1d supply, fill #0

## 2021-07-24 MED ORDER — METFORMIN HCL 500 MG PO TABS
500.0000 mg | ORAL_TABLET | Freq: Two times a day (BID) | ORAL | 0 refills | Status: DC
  Filled 2021-07-24: qty 180, 90d supply, fill #0

## 2021-07-24 MED ORDER — ASPIRIN LOW DOSE 81 MG PO TBEC
81.0000 mg | DELAYED_RELEASE_TABLET | Freq: Every day | ORAL | 2 refills | Status: AC
  Filled 2021-07-24: qty 34, 34d supply, fill #0

## 2021-07-24 NOTE — Progress Notes (Signed)
Pt seen by Dr Joya Gaskins. ? ?She wants Fast-click lancets, currently has a soft-click. She almost stuck herself. Fast-clicks were ordered. Lancets given.  ? ?Rx for test strips written 03/29, she has them. ? ?She was also given Lantus. It is in the fridge, 30 U am, 20 U pm.  ? ?She has some metformin, will refill.  ?Has lots of pravastatin, and omeprazole.  ? ?She has not seen her PCP. Ms [name] is her PCP, office in [facility].  ? ?She saw the foot doctor, was referred for orthotics and diabetic shoes. However, she could not get them because her only insurance is Medicaid.  ? ?Her Phone: [phone] ? ?She will be referred to the [facility] for her feet. Dr Joya Gaskins to make the referral. ?[facility]: [place] ?[address] [place], [place] [address] ?[phone] ?[email or web address] ? ?She was given Lubriderm cream, manual lancets and bandaids. ? ?PCP is :  [name], NP ?[facility] ?[address] [place], [place] [address] ? ~19.2 [address] ?[phone] ? ?Rosaria Ferries, PA-C ?07/24/2021 ?4:30 PM ? ? ? ? ?

## 2021-07-31 ENCOUNTER — Encounter: Payer: Self-pay | Admitting: Physician Assistant

## 2021-07-31 ENCOUNTER — Other Ambulatory Visit: Payer: Self-pay | Admitting: Family Medicine

## 2021-07-31 MED ORDER — FLUTICASONE PROPIONATE 50 MCG/ACT NA SUSP
1.0000 | Freq: Every day | NASAL | 6 refills | Status: DC
  Filled 2021-07-31: qty 16, 30d supply, fill #0

## 2021-07-31 MED ORDER — DEBROX 6.5 % OT SOLN
5.0000 [drp] | Freq: Two times a day (BID) | OTIC | 0 refills | Status: DC
  Filled 2021-07-31: qty 15, 30d supply, fill #0

## 2021-07-31 NOTE — Progress Notes (Signed)
Seen at [facility]  ? ?46 yo with hx DM presented with sore throat for several weeks after [facility] turned on.  Nonproductive cough.  Stuffy nose.  No fevers, chills, body aches. ? ?VS BP 132/80, HR 97, 100% on RA ?Oropharynx without visualized erythema, swelling, or exudate ?TM's obscurred by cerumen ?Hyperemic nasal mucosa ? ?AP  ?Sx sound c/w post nasal gtt?  Will try claritin, nasal saline, cough drops.  Will order flonase. ?Fasting BG this AM high, but yesterday and day before were around goal.  Will follow. ?Debrox for ears ? ?Fayrene Helper, MD ? ?--------------------- ? ?Pt seen by Dr Florene Glen. ? ?C/o sore throat x 2-3 weeks, says it got worse when they cut the A/C on.  ? ?No fevers, chills, body aches. Nose was a little stuffed up, is better now. Ears feel a little stuffed up. She has a bit of a cough. Feet have been swelling since she got here. 132/80, 97, 100% ?Both ears have wax, L TM obscured by wax but visible, R TM completely obscured by wax.  ?Needs OTC ear drops to get rid of the wax.  ? ?She was given cough drops, Claritin, saline nasal spray, needs Flonase.  ? ?She has trouble swallowing pills chronically, crushes meds.    ? ?CBG was 273 this am.  She is taking meds as directed.  Does not like the lancets she was given, will be given simpler lancets.  ? ?PCP: [name], NP  has appt in June. She was asked to move it up.  ? ? ? ? ? ?

## 2021-08-01 ENCOUNTER — Other Ambulatory Visit: Payer: Self-pay

## 2021-08-01 ENCOUNTER — Other Ambulatory Visit (HOSPITAL_COMMUNITY): Payer: Self-pay

## 2021-08-01 MED ORDER — FLUTICASONE PROPIONATE 50 MCG/ACT NA SUSP
1.0000 | Freq: Every day | NASAL | 6 refills | Status: DC
  Filled 2021-08-01: qty 16, 30d supply, fill #0
  Filled 2021-08-01: qty 16, 34d supply, fill #0

## 2021-08-01 MED ORDER — EAR DROPS 6.5 % OT SOLN
5.0000 [drp] | Freq: Two times a day (BID) | OTIC | 0 refills | Status: AC
  Filled 2021-08-01 (×2): qty 15, 30d supply, fill #0

## 2021-08-01 NOTE — Addendum Note (Signed)
Addended by: Estanislado Emms on: 08/01/2021 10:06 AM ? ? Modules accepted: Orders ? ?

## 2021-08-02 ENCOUNTER — Other Ambulatory Visit (HOSPITAL_COMMUNITY): Payer: Self-pay

## 2021-08-07 ENCOUNTER — Other Ambulatory Visit (HOSPITAL_COMMUNITY): Payer: Self-pay

## 2021-08-07 ENCOUNTER — Encounter: Payer: Self-pay | Admitting: Physician Assistant

## 2021-08-07 ENCOUNTER — Other Ambulatory Visit: Payer: Self-pay | Admitting: Critical Care Medicine

## 2021-08-07 MED ORDER — AZITHROMYCIN 250 MG PO TABS
ORAL_TABLET | ORAL | 0 refills | Status: DC
  Filled 2021-08-07: qty 6, 5d supply, fill #0
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
## 2021-08-08 ENCOUNTER — Other Ambulatory Visit: Payer: Self-pay | Admitting: *Deleted

## 2021-08-08 ENCOUNTER — Other Ambulatory Visit (HOSPITAL_COMMUNITY): Payer: Self-pay

## 2021-08-08 MED ORDER — ACCU-CHEK SOFTCLIX LANCETS MISC
Freq: Three times a day (TID) | 12 refills | Status: AC
  Filled 2021-08-08: qty 100, 33d supply, fill #0

## 2021-08-14 ENCOUNTER — Ambulatory Visit (INDEPENDENT_AMBULATORY_CARE_PROVIDER_SITE_OTHER): Payer: Medicaid Other | Admitting: Podiatry

## 2021-08-14 ENCOUNTER — Encounter: Payer: Self-pay | Admitting: Podiatry

## 2021-08-14 ENCOUNTER — Encounter: Payer: Self-pay | Admitting: *Deleted

## 2021-08-14 DIAGNOSIS — E0865 Diabetes mellitus due to underlying condition with hyperglycemia: Secondary | ICD-10-CM

## 2021-08-14 DIAGNOSIS — B351 Tinea unguium: Secondary | ICD-10-CM

## 2021-08-14 DIAGNOSIS — M79675 Pain in left toe(s): Secondary | ICD-10-CM

## 2021-08-14 DIAGNOSIS — M79674 Pain in right toe(s): Secondary | ICD-10-CM

## 2021-08-14 NOTE — Congregational Nurse Program (Signed)
Pt states she broke her vial of insulin this weekend and had to get another at pharmacy. Offered pens of insulin, she refuses. ?Willprovide more of the quik click lancets Thursday. ?

## 2021-08-24 NOTE — Progress Notes (Signed)
  Subjective:  Patient ID: [name], female    DOB: 1975-05-23,  MRN: [id number]  [name] presents to clinic today for preventative diabetic foot care and painful elongated mycotic toenails 1-5 bilaterally which are tender when wearing enclosed shoe gear. Pain is relieved with periodic professional debridement.  Patient states blood glucose was 88 mg/dl today.  Last HgA1c was unknown.  New problem(s): None.   Patient has been seeing Dr. Sherryle Lis for wounds bilateral achilles. She relates both have healed.  PCP is [name], NP , and last visit was March, 2023.  No Known Allergies  Review of Systems: Negative except as noted in the HPI.  Objective: No changes noted in today's physical examination. There were no vitals filed for this visit.  [name] is a pleasant 46 y.o. female in NAD. AAO X 3.  Vascular Examination: CFT <3 seconds b/l LE. Faintly palpable pedal pulses b/l. Pedal hair absent. No pain with calf compression b/l. No edema noted b/l LE. No cyanosis or clubbing noted b/l LE.  Dermatological Examination: Toenails 1-5 b/l elongated, discolored, dystrophic, thickened, crumbly with subungual debris and tenderness to dorsal palpation. Wounds posterior aspect b/l achilles have completely resolved.  Musculoskeletal Examination: Muscle strength 5/5 to all lower extremity muscle groups bilaterally. HAV with bunion deformity noted b/l LE. Patient ambulates independent of any assistive aids. She is wearing soft, supportive shoe gear on today with no evidence of skin irritations on lower extremities.  Neurological Examination: Protective sensation intact 5/5 intact bilaterally with 10g monofilament b/l. Vibratory sensation intact b/l.  Assessment/Plan: 1. Pain due to onychomycosis of toenails of both feet   2. Diabetes mellitus due to underlying condition, uncontrolled, with hyperglycemia ([place])   -Patient was evaluated and treated. All patient's  and/or POA's questions/concerns answered on today's visit. -Continue diabetic foot care principles: inspect feet daily, monitor glucose as recommended by PCP and/or Endocrinologist, and follow prescribed diet per PCP, Endocrinologist and/or dietician. -Patient to continue soft, supportive shoe gear daily. -Toenails 1-5 b/l were debrided in length and girth with sterile nail nippers and dremel without iatrogenic bleeding.  -Patient/POA to call should there be question/concern in the interim.   Return in about 3 months (around 11/14/2021).  Marzetta Board, DPM

## 2021-09-05 ENCOUNTER — Encounter (HOSPITAL_COMMUNITY): Payer: Self-pay | Admitting: Emergency Medicine

## 2021-09-05 ENCOUNTER — Other Ambulatory Visit: Payer: Self-pay

## 2021-09-05 ENCOUNTER — Emergency Department (HOSPITAL_COMMUNITY)
Admission: EM | Admit: 2021-09-05 | Discharge: 2021-09-05 | Disposition: A | Discharge status: Home / Self Care | Payer: Medicaid Other | Attending: Emergency Medicine | Admitting: Emergency Medicine

## 2021-09-05 DIAGNOSIS — I10 Essential (primary) hypertension: Secondary | ICD-10-CM | POA: Diagnosis not present

## 2021-09-05 DIAGNOSIS — E1165 Type 2 diabetes mellitus with hyperglycemia: Secondary | ICD-10-CM | POA: Diagnosis not present

## 2021-09-05 DIAGNOSIS — Z794 Long term (current) use of insulin: Secondary | ICD-10-CM | POA: Insufficient documentation

## 2021-09-05 DIAGNOSIS — R739 Hyperglycemia, unspecified: Secondary | ICD-10-CM

## 2021-09-05 LAB — URINALYSIS, ROUTINE W REFLEX MICROSCOPIC
Bilirubin Urine: NEGATIVE
Glucose, UA: >=500 mg/dL — AB
Ketones, ur: NEGATIVE mg/dL
Nitrite: NEGATIVE
Protein, ur: 100 mg/dL — AB
RBC / HPF: >50 RBC/hpf — ABNORMAL HIGH (ref 0–5)
Specific Gravity, Urine: 1.023 (ref 1.005–1.030)
WBC, UA: >50 WBC/hpf — ABNORMAL HIGH (ref 0–5)
pH: 5 (ref 5.0–8.0)

## 2021-09-05 LAB — CBG MONITORING, ED
Glucose-Capillary: 230 mg/dL — ABNORMAL HIGH (ref 70–99)
Glucose-Capillary: 237 mg/dL — ABNORMAL HIGH (ref 70–99)
Glucose-Capillary: 323 mg/dL — ABNORMAL HIGH (ref 70–99)

## 2021-09-05 LAB — BASIC METABOLIC PANEL
Anion gap: 12 (ref 5–15)
BUN: 24 mg/dL — ABNORMAL HIGH (ref 6–20)
CO2: 21 mmol/L — ABNORMAL LOW (ref 22–32)
Calcium: 9.1 mg/dL (ref 8.9–10.3)
Chloride: 98 mmol/L (ref 98–111)
Creatinine, Ser: 0.86 mg/dL (ref 0.44–1.00)
GFR, Estimated: >60 mL/min (ref >60)
Glucose, Bld: 343 mg/dL — ABNORMAL HIGH (ref 70–99)
Potassium: 3.9 mmol/L (ref 3.5–5.1)
Sodium: 131 mmol/L — ABNORMAL LOW (ref 135–145)

## 2021-09-05 LAB — CBC WITH DIFFERENTIAL/PLATELET
Abs Immature Granulocytes: 0.02 10*3/uL (ref 0.00–0.07)
Basophils Absolute: 0 10*3/uL (ref 0.0–0.1)
Basophils Relative: 0 %
Eosinophils Absolute: 0.2 10*3/uL (ref 0.0–0.5)
Eosinophils Relative: 3 %
HCT: 30.9 % — ABNORMAL LOW (ref 36.0–46.0)
Hemoglobin: 9.7 g/dL — ABNORMAL LOW (ref 12.0–15.0)
Immature Granulocytes: 0 %
Lymphocytes Relative: 30 %
Lymphs Abs: 2.4 10*3/uL (ref 0.7–4.0)
MCH: 23.5 pg — ABNORMAL LOW (ref 26.0–34.0)
MCHC: 31.4 g/dL (ref 30.0–36.0)
MCV: 74.8 fL — ABNORMAL LOW (ref 80.0–100.0)
Monocytes Absolute: 0.6 10*3/uL (ref 0.1–1.0)
Monocytes Relative: 8 %
Neutro Abs: 4.7 10*3/uL (ref 1.7–7.7)
Neutrophils Relative %: 59 %
Platelets: 605 10*3/uL — ABNORMAL HIGH (ref 150–400)
RBC: 4.13 MIL/uL (ref 3.87–5.11)
RDW: 15.1 % (ref 11.5–15.5)
WBC: 7.9 10*3/uL (ref 4.0–10.5)
nRBC: 0 % (ref 0.0–0.2)

## 2021-09-05 LAB — I-STAT BETA HCG BLOOD, ED (MC, WL, AP ONLY): I-stat hCG, quantitative: <5 m[IU]/mL (ref <5)

## 2021-09-05 MED ORDER — INSULIN ASPART 100 UNIT/ML IJ SOLN
4.0000 [IU] | Freq: Once | INTRAMUSCULAR | Status: AC
  Administered 2021-09-05: 4 [IU] via SUBCUTANEOUS

## 2021-09-05 NOTE — Discharge Instructions (Addendum)
You have been seen and discharged from the emergency department.  Your blood work showed mildly elevated blood sugar.  You are given medicine here in the department.  The rest of your blood work/work-up was normal.  Stable hydrated.  Take home medications as directed.  Follow-up with your primary provider for further evaluation and further care. If you have any worsening symptoms or further concerns for your health please return to an emergency department for further evaluation.
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
## 2021-09-05 NOTE — ED Notes (Signed)
Reviewed discharge instructions with patient. Follow-up care reviewed. Patient verbalized understanding. Patient A&Ox4, VSS, and ambulatory with steady gait upon discharge.  

## 2021-09-05 NOTE — ED Notes (Signed)
Pt reports sore throat onset yesterday and denies any other cold like s/s. Pt reports her sugar was high as well.
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
## 2021-09-05 NOTE — ED Notes (Signed)
Went to assess pt and pt not in room. Pt's coat on bed.

## 2021-09-06 LAB — URINE CULTURE: Culture: >=100000 — AB

## 2021-09-18 ENCOUNTER — Other Ambulatory Visit: Payer: Self-pay | Admitting: Critical Care Medicine

## 2021-09-18 ENCOUNTER — Encounter: Payer: Self-pay | Admitting: Physician Assistant

## 2021-09-18 DIAGNOSIS — H9390 Unspecified disorder of ear, unspecified ear: Secondary | ICD-10-CM

## 2021-09-18 MED ORDER — CEPHALEXIN 500 MG PO CAPS: 500.0000 mg | ORAL_CAPSULE | Freq: Four times a day (QID) | ORAL | 0 refills | Status: AC

## 2021-09-18 MED ORDER — "INSULIN SYRINGE 31G X 5/16"" 0.5 ML MISC": 1.0000 | Freq: Every day | 0 refills | Status: DC

## 2021-09-18 NOTE — Progress Notes (Signed)
Pt seen by Dr Joya Gaskins.  She has a lesion behind her R ear, it is about .05 x 1 cm, it has some pus and some blood when pressed.  She will need this removed, most likely by Dermatology. She would like [place] to make the referral, most likely [facility] on [address].   She is concerned about lesions on her abdomen, they are from the adhesive on ECG patches, not harmful to her.    118/69, 106, 99%  She is to see her PCP this Friday.   Her cell number is different, [phone]. She still has the other number, but someone is using it.   The [place] she uses is the [facility] on [address]. Keflex was sent there, instructions written out.   She asks for insulin syringes, they were sent in as well.  She would be ok using the insulin pens, but they ran out too soon.   She will use the bus to pick up meds tomorrow.   Rosaria Ferries, PA-C 09/18/2021 4:06 PM

## 2021-11-13 ENCOUNTER — Other Ambulatory Visit: Payer: Self-pay | Admitting: Critical Care Medicine

## 2021-11-13 ENCOUNTER — Encounter: Payer: Self-pay | Admitting: Physician Assistant

## 2021-11-13 DIAGNOSIS — L97321 Non-pressure chronic ulcer of left ankle limited to breakdown of skin: Secondary | ICD-10-CM

## 2021-11-13 MED ORDER — CEPHALEXIN 500 MG PO CAPS: 500.0000 mg | ORAL_CAPSULE | Freq: Three times a day (TID) | ORAL | 0 refills | Status: AC

## 2021-11-13 NOTE — Progress Notes (Signed)
Pt seen by Dr Joya Gaskins.   Pt was sitting outside during the repair period. She sat on her L foot, it was folded under her.   Her foot is chronically numb, so she did not notice anything.  She got a wound on her L ankle, on the end of the fibula.   The skin around it is darkened, but no tenderness or pus. The wound is open but dried up, no drainage. Her L foot has edema, R foot has minimal edema. Distal pulses are intact. Cap refill WNL.  She has an appt next week w/ [facility] for her toenails, the referral was updated to let them know about her foot wound.   Rx for Keflex 500 mg tid was sent  for 7 days. She was given Tylenol for pain.   Her CBG was 68 today. On recheck, it was 94.  Rosaria Ferries, PA-C 11/13/2021 3:44 PM

## 2021-11-18 ENCOUNTER — Encounter: Payer: Self-pay | Admitting: *Deleted

## 2021-11-18 NOTE — Congregational Nurse Program (Signed)
It was reported that pt has had periods of disorientation during night and once during day. "Wanders in her sleep" per Health visitor. Fell at one episode and was found sitting in floor. This am after episode CBG 64, pt comes to this nurse and states her other medication is causing this. She is advised that low blood sugar may cause this but she needs eval by PCP.  She states she checks her blood sugar after she eats and takes insulin when she can States "I just checked my sugar and it was 65 so I'm going to take my insulin. She is advised not to take any insulin now. She is also advised to check blood sugar before eating and take insulin based on that number. States she was going to take 30 units. She is strongly advised not to take insulin and to consume juice and a snack now, next meal at 1030. She states her blood sugar last evening was "60 something" and at bedtime she took 30 units. She is advised this unsafe and that is most probably the reason for her episodes of disorientation. Called her pcp in [place] and made appt for 8/15 at 0900. Advised case managers Noel Christmas and Marya Amsler that she will need to be in [place] at 0900 in am. Also brought them up to date on issues. Will notify dr Iran Planas for advisement

## 2021-11-19 ENCOUNTER — Encounter: Payer: Self-pay | Admitting: Podiatry

## 2021-11-19 ENCOUNTER — Ambulatory Visit (INDEPENDENT_AMBULATORY_CARE_PROVIDER_SITE_OTHER): Payer: Medicaid Other | Admitting: Podiatry

## 2021-11-19 DIAGNOSIS — E0865 Diabetes mellitus due to underlying condition with hyperglycemia: Secondary | ICD-10-CM | POA: Diagnosis not present

## 2021-11-19 DIAGNOSIS — B351 Tinea unguium: Secondary | ICD-10-CM | POA: Diagnosis not present

## 2021-11-19 DIAGNOSIS — M79674 Pain in right toe(s): Secondary | ICD-10-CM | POA: Diagnosis not present

## 2021-11-19 DIAGNOSIS — L97309 Non-pressure chronic ulcer of unspecified ankle with unspecified severity: Secondary | ICD-10-CM

## 2021-11-19 DIAGNOSIS — E13622 Other specified diabetes mellitus with other skin ulcer: Secondary | ICD-10-CM | POA: Diagnosis not present

## 2021-11-19 DIAGNOSIS — M79675 Pain in left toe(s): Secondary | ICD-10-CM | POA: Diagnosis not present

## 2021-11-24 NOTE — Progress Notes (Signed)
  Subjective:  Patient ID: [name], female    DOB: Aug 30, 1975,  MRN: [id number]  [name] presents to clinic today for preventative diabetic foot care and painful thick toenails that are difficult to trim. Pain interferes with ambulation. Aggravating factors include wearing enclosed shoe gear. Pain is relieved with periodic professional debridement.  Patient states blood glucose was 266 mg/dl today. Last known HgA1c was unknown.    New problem(s): with chief concern of injury to left ankle. Injury occurred several weeks ago. Injury recurred as a result of a sitting on the hot concrete. Patient states aggravating factor(s) is/are direct pressure.  Patient has tseen her PCP who prescribed Cephalexin, 500 mg. She relates left ankle is not painful.  PCP is [name], NP , and last visit was  November 19, 2021  No Known Allergies  Review of Systems: Negative except as noted in the HPI.  Objective:  There were no vitals filed for this visit.  [name] is a pleasant 46 y.o. female in NAD. AAO X 3.  Vascular Examination: CFT <3 seconds b/l LE. Faintly palpable pedal pulses b/l. Pedal hair absent. No pain with calf compression b/l. No edema noted b/l LE. No cyanosis or clubbing noted b/l LE.  Dermatological Examination: Toenails 1-5 b/l elongated, discolored, dystrophic, thickened, crumbly with subungual debris and tenderness to dorsal palpation.   Wound left lateral malleolus with postinflammatory hyperpigmentation. Central core measuring 1.0 cm in diameter and surrounding area of hyperpigmentation measuring 2.5 x 3.5 cm. No drainage nor purulence expressed.   Musculoskeletal Examination: Muscle strength 5/5 to all lower extremity muscle groups bilaterally. HAV with bunion deformity noted b/l LE. Patient ambulates independent of any assistive aids. She is wearing soft, supportive shoe gear on today with no evidence of skin irritations on lower  extremities.  Neurological Examination: Protective sensation intact 5/5 intact bilaterally with 10g monofilament b/l. Vibratory sensation intact b/l.  Assessment/Plan: 1. Pain due to onychomycosis of toenails of both feet   2. 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 Diabetes mellitus due to underlying condition, uncontrolled, with hyperglycemia ([name])     -Patient was evaluated and treated. All patient's and/or POA's questions/concerns answered on today's visit. -Patient to continue antibiotics per PCP. Keep left ankle protected . -Mycotic toenails 1-5 bilaterally were debrided in length and girth with sterile nail nippers and dremel without incident. -Patient referred to Dr. Sherryle Lis for evaluation of left ankle ulceration. -Patient/POA to call should there be question/concern in the interim.   Return in about 3 months (around 02/19/2022).  Marzetta Board, DPM

## 2021-11-28 ENCOUNTER — Ambulatory Visit (INDEPENDENT_AMBULATORY_CARE_PROVIDER_SITE_OTHER): Payer: Medicaid Other | Admitting: Podiatry

## 2021-11-28 DIAGNOSIS — M216X2 Other acquired deformities of left foot: Secondary | ICD-10-CM | POA: Diagnosis not present

## 2021-11-28 DIAGNOSIS — E13622 Other specified diabetes mellitus with other skin ulcer: Secondary | ICD-10-CM

## 2021-11-28 DIAGNOSIS — L97309 Non-pressure chronic ulcer of unspecified ankle with unspecified severity: Secondary | ICD-10-CM

## 2021-11-28 NOTE — Progress Notes (Signed)
  Subjective:  Patient ID: [name], female    DOB: 01-17-1976,  MRN: [id number]  Chief Complaint  Patient presents with   Diabetic Ulcer    left ankle wound. On Keflex 500 mg po tid started 11/13/21. per [facility]    46 y.o. female presents with the above complaint. History confirmed with patient.  This been developed not long ago she is taking the antibiotics that were prescribed, denies fever chills nausea vomiting  Objective:  Physical Exam: warm, good capillary refill, no trophic changes or ulcerative lesions, normal DP and PT pulses, and full-thickness ulceration with exposed subcutaneous tissue and postdebridement measures 1.2 x 0.5 x 0.3 cm.  Fibrotic wound base.  No signs of infection or exposed bone tendon or joint.  Assessment:   1. 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  Ulcer left ankle -We discussed the etiology and factors that are a part of the wound healing process.  We also discussed the risk of infection both soft tissue and osteomyelitis from open ulceration.  Discussed the risk of limb loss if this happens or worsens. -Debridement as below. -Dressed with Iodosorb, DSD. -Continue home dressing changes daily with gauze and Silvadene -She will follow-up with the wound care center, referral was placed for this  Procedure: Excisional Debridement of Wound Rationale: Removal of non-viable soft tissue from the wound to promote healing.  Anesthesia: none Post-Debridement Wound Measurements: 1.2 cm x 0.5 cm x 0.3 cm  Type of Debridement: Sharp Excisional Tissue Removed: Non-viable soft tissue Depth of Debridement: subcutaneous tissue. Technique: Sharp excisional debridement to bleeding, viable wound base.  Dressing: Dry, sterile, compression dressing. Disposition: Patient tolerated procedure well.        Return if symptoms worsen or fail to improve.

## 2021-11-28 NOTE — Patient Instructions (Signed)
Call  [phone] if you do not hear from the wound care center about your referral

## 2021-12-11 ENCOUNTER — Encounter (HOSPITAL_BASED_OUTPATIENT_CLINIC_OR_DEPARTMENT_OTHER): Payer: Medicaid Other | Attending: General Surgery | Admitting: General Surgery

## 2021-12-11 DIAGNOSIS — E11622 Type 2 diabetes mellitus with other skin ulcer: Secondary | ICD-10-CM | POA: Insufficient documentation

## 2021-12-11 DIAGNOSIS — E1151 Type 2 diabetes mellitus with diabetic peripheral angiopathy without gangrene: Secondary | ICD-10-CM | POA: Insufficient documentation

## 2021-12-11 DIAGNOSIS — I10 Essential (primary) hypertension: Secondary | ICD-10-CM | POA: Insufficient documentation

## 2021-12-11 DIAGNOSIS — K219 Gastro-esophageal reflux disease without esophagitis: Secondary | ICD-10-CM | POA: Insufficient documentation

## 2021-12-11 DIAGNOSIS — L97322 Non-pressure chronic ulcer of left ankle with fat layer exposed: Secondary | ICD-10-CM | POA: Insufficient documentation

## 2021-12-11 NOTE — Progress Notes (Signed)
[name], [name] ([id number]) Visit Report for 12/11/2021 Abuse Risk Screen Details Patient Name: Date of Service: [name] 12/11/2021 10:30 A M Medical Record Number: [id number] Patient Account Number: [id number] Date of Birth/Sex: Treating RN: [birth year] (46 y.o. [name] Primary Care [name]: [name] Other Clinician: Referring [name]: Treating [name]/Extender: [name]: 0 Abuse Risk Screen Items Answer ABUSE RISK SCREEN: Has anyone close to you tried to hurt or harm you recentlyo No Do you feel uncomfortable with anyone in your familyo No Has anyone forced you do things that you didnt want to doo No Electronic Signature(s) Signed: 12/11/2021 4:47:34 PM By: Tina Creamer RN, BSN Entered By: [name] on 12/11/2021 11:13:55 -------------------------------------------------------------------------------- Activities of Daily Living Details Patient Name: Date of Service: [name] 12/11/2021 10:30 A M Medical Record Number: [id number] Patient Account Number: [id number] Date of Birth/Sex: Treating RN: 06/23/1975 (46 y.o. [name] Primary Care Amiel Sharrow: [name] Other Clinician: Referring [name]: Treating [name]/Extender: [name]: 0 Activities of Daily Living Items Answer Activities of Daily Living (Please select one for each item) Drive Automobile Not Able T Medications ake Need Assistance Use T elephone Completely Able Care for Appearance Need Assistance Use T oilet Completely Able Bath / Shower Completely Able Dress Self Completely Able Feed Self Completely Able Walk Completely Able Get In / Out Bed Completely Able Housework Need Assistance Prepare Meals Need Assistance Handle Money Need Assistance Shop for Self Need Assistance Electronic Signature(s) Signed: 12/11/2021 4:47:34 PM By: Tina Creamer RN, BSN Entered By:  [name] on 12/11/2021 11:14:53 -------------------------------------------------------------------------------- Education Screening Details Patient Name: Date of Service: [name] 12/11/2021 10:30 A M Medical Record Number: [id number] Patient Account Number: [id number] Date of Birth/Sex: Treating RN: 05/07/1975 (46 y.o. [name] Primary Care Zaydyn Havey: [name] Other Clinician: Referring [name]: Treating [name]/Extender: [name]: 0 Learning Preferences/Education Level/Primary Language Learning Preference: Explanation Highest Education Level: High School Preferred Language: English Cognitive Barrier Language Barrier: No Translator Needed: No Memory Deficit: No Emotional Barrier: No Cultural/Religious Beliefs Affecting Medical Care: No Physical Barrier Impaired Vision: Yes Glasses Impaired Hearing: No Decreased Hand dexterity: No Knowledge/Comprehension Knowledge Level: Medium Comprehension Level: Medium Ability to understand written instructions: Medium Ability to understand verbal instructions: Medium Motivation Anxiety Level: Calm Cooperation: Cooperative Education Importance: Denies Need Interest in Health Problems: Asks Questions Perception: Coherent Willingness to Engage in Self-Management Medium Activities: Readiness to Engage in Self-Management Medium Activities: Electronic Signature(s) Signed: 12/11/2021 4:47:34 PM By: Tina Creamer RN, BSN Entered By: [name] on 12/11/2021 11:15:48 -------------------------------------------------------------------------------- Fall Risk Assessment Details Patient Name: Date of Service: [name] 12/11/2021 10:30 A M Medical Record Number: [id number] Patient Account Number: [id number] Date of Birth/Sex: Treating RN: 1975-11-22 (46 y.o. [name] Primary Care Garald Rhew: [name] Other Clinician: Referring  Pat Elicker: Treating Yaneli Keithley/Extender: [name]: 0 Fall Risk Assessment Items Have you had 2 or more falls in the last 12 monthso 0 Yes Have you had any fall that resulted in injury in the last 12 monthso 0 Yes FALLS RISK SCREEN History of falling - immediate or within 3 months 25 Yes Secondary diagnosis (Do you have 2 or more medical diagnoseso) 15 Yes Ambulatory aid None/bed rest/wheelchair/nurse 0 Yes Crutches/cane/walker 0 No Furniture 0 No Intravenous therapy Access/Saline/Heparin Lock 0 No Gait/Transferring Normal/ bed rest/ wheelchair 0 No Weak (short steps  with or without shuffle, stooped but able to lift head while walking, may seek 10 Yes support from furniture) Impaired (short steps with shuffle, may have difficulty arising from chair, head down, impaired 0 No balance) Mental Status Oriented to own ability 0 Yes Electronic Signature(s) Signed: 12/11/2021 4:47:34 PM By: Tina Creamer RN, BSN Entered By: [name] on 12/11/2021 11:16:48 -------------------------------------------------------------------------------- Foot Assessment Details Patient Name: Date of Service: [name] 12/11/2021 10:30 A M Medical Record Number: [id number] Patient Account Number: [id number] Date of Birth/Sex: Treating RN: November 22, 1975 (46 y.o. [name] Primary Care Ellon Marasco: [name] Other Clinician: Referring Kaisyn Millea: Treating Jaiyah Beining/Extender: [name]: 0 Foot Assessment Items Site Locations + = Sensation present, - = Sensation absent, C = Callus, U = Ulcer R = Redness, W = Warmth, M = Maceration, PU = Pre-ulcerative lesion F = Fissure, S = Swelling, D = Dryness Assessment Right: Left: Other Deformity: No No Prior Foot Ulcer: No No Prior Amputation: No No Charcot Joint: No No Ambulatory Status: Ambulatory Without Help Gait: Electronic Signature(s) Signed: 12/11/2021  4:47:34 PM By: Tina Creamer RN, BSN Entered By: [name] on 12/11/2021 11:19:13 -------------------------------------------------------------------------------- Nutrition Risk Screening Details Patient Name: Date of Service: [name] 12/11/2021 10:30 A M Medical Record Number: [id number] Patient Account Number: [id number] Date of Birth/Sex: Treating RN: 29-Sep-1975 (46 y.o. [name] Primary Care [name]: [name] Other Clinician: Referring Yianna Tersigni: Treating Khyli Swaim/Extender: [name]: 0 Height (in): 58 Weight (lbs): 111 Body Mass Index (BMI): 23.2 Nutrition Risk Screening Items Score Screening NUTRITION RISK SCREEN: I have an illness or condition that made me change the kind and/or amount of food I eat 0 No I eat fewer than two meals per day 0 No I eat few fruits and vegetables, or milk products 0 No I have three or more drinks of beer, liquor or wine almost every day 0 No I have tooth or mouth problems that make it hard for me to eat 0 No I don't always have enough money to buy the food I need 0 No I eat alone most of the time 0 No I take three or more different prescribed or over-the-counter drugs a day 1 Yes Without wanting to, I have lost or gained 10 pounds in the last six months 0 No I am not always physically able to shop, cook and/or feed myself 2 Yes Nutrition Protocols Good Risk Protocol Moderate Risk Protocol 0 Provide education on nutrition High Risk Proctocol Risk Level: Moderate Risk Score: 3 Electronic Signature(s) Signed: 12/11/2021 4:47:34 PM By: Tina Creamer RN, BSN Entered By: [name] on 12/11/2021 11:17:34

## 2021-12-12 NOTE — Progress Notes (Signed)
[name], [name] ([id number]) Visit Report for 12/11/2021 Chief Complaint Document Details Patient Name: Date of Service: [name] 12/11/2021 10:30 A Church Medical Record Number: [id number] Patient Account Number: [id number] Date of Birth/Sex: Treating RN: Apr 09, 1975 (46 y.o. [name] Primary Care Provider: Vonita Church Other Clinician: Referring Provider: Treating Provider/Extender: [name] in Treatment: 0 Information Obtained from: Patient Chief Complaint Patient seen for complaints of Non-Healing Wound. Electronic Signature(s) Signed: 12/11/2021 12:24:20 PM By: Tina Maudlin MD FACS Entered By: [name] on 12/11/2021 12:24:20 -------------------------------------------------------------------------------- Debridement Details Patient Name: Date of Service: [name] Medical Record Number: [id number] Patient Account Number: [id number] Date of Birth/Sex: Treating RN: 08/08/1975 (46 y.o. [name] Primary Care Provider: Vonita Church Other Clinician: Referring Provider: Treating Provider/Extender: [name] in Treatment: 0 Debridement Performed for Assessment: Wound #1 Left,Lateral Ankle Performed By: Physician Tina Maudlin, MD Debridement Type: Debridement Level of Consciousness (Pre-procedure): Awake and Alert Pre-procedure Verification/Time Out Yes - 11:40 Taken: Start Time: 11:41 Pain Control: Lidocaine 5% topical ointment T Area Debrided (L x W): otal 0.7 (cm) x 1.2 (cm) = 0.84 (cm) Tissue and other material debrided: Viable, Slough, Slough Level: Non-Viable Tissue Debridement Description: Selective/Open Wound Instrument: Curette Bleeding: Minimum Hemostasis Achieved: Pressure Procedural Pain: 0 Post Procedural Pain: 0 Response to Treatment: Procedure was tolerated well Level of Consciousness (Post- Awake and Alert procedure): Post  Debridement Measurements of Total Wound Length: (cm) 0.7 Width: (cm) 1.2 Depth: (cm) 0.2 Volume: (cm) 0.132 Character of Wound/Ulcer Post Debridement: Requires Further Debridement Post Procedure Diagnosis Same as Pre-procedure Electronic Signature(s) Signed: 12/11/2021 12:20:36 PM By: Tina Maudlin MD FACS Signed: 12/11/2021 4:47:34 PM By: Tina Creamer RN, BSN Entered By: [name] on 12/11/2021 11:44:24 -------------------------------------------------------------------------------- HPI Details Patient Name: Date of Service: [name] 12/11/2021 10:30 A Church Medical Record Number: [id number] Patient Account Number: [id number] Date of Birth/Sex: Treating RN: 03/28/1976 (46 y.o. [name] Primary Care Provider: Vonita Church Other Clinician: Referring Provider: Treating Provider/Extender: [name] in Treatment: 0 History of Present Illness HPI Description: ADMISSION 12/11/2021 History is somewhat limited secondary to intellectual delay. Some of the history has been garnered from review of the electronic medical record. The patient reports that she was sitting on pavement with her left foot tucked underneath her. She says that she developed a wound on that site. She saw her PCP. They note from that clinic date (11/13/2021: Says that the wound was open but dried without any drainage. Left foot edema was described. A 7-day course of Keflex was prescribed. She is followed by podiatry for routine diabetic foot care. When she saw them on August 15, the provider referred her to a different podiatrist who debrided the wound. He applied Iodosorb and prescribed Silvadene daily dressing changes. The patient states that she has not been changing her dressing. She apparently resides in some sort of facility and she says there are nurses and doctors there. Specific details are difficult to sort out. She denies any significant pain to the wound. On her  left lateral malleolus, there is a circular wound that does seem consistent with the history provided. There is some slough buildup in the center of the wound. No fluctuance, odor, or purulent drainage. Electronic Signature(s) Signed: 12/11/2021 12:31:22 PM By: Tina Maudlin MD FACS Entered By: [name] on 12/11/2021 12:31:21 -------------------------------------------------------------------------------- Physical Exam Details Patient Name: Date of Service: [name] 12/11/2021 10:30 A Church Medical Record Number: [id number] Patient Account Number: [id number] Date of Birth/Sex: Treating RN: 10/26/1975 (46 y.o. [name] Primary Care Provider: Vonita Church Other Clinician: Referring Provider: Treating Provider/Extender: [name] in Treatment: 0 Constitutional . . . . No acute distress. Respiratory Normal work of breathing on room air.. Cardiovascular . Notes 12/11/2021: On her left lateral malleolus, there is a circular wound that does seem consistent with the history provided. There is some slough buildup in the center of the wound. No fluctuance, odor, or purulent drainage. Electronic Signature(s) Signed: 12/11/2021 12:32:21 PM By: Tina Maudlin MD FACS Entered By: [name] on 12/11/2021 12:32:21 -------------------------------------------------------------------------------- Physician Orders Details Patient Name: Date of Service: [name] Medical Record Number: [id number] Patient Account Number: [id number] Date of Birth/Sex: Treating RN: [birth year] (46 y.o. [name] Primary Care Provider: Vonita Church Other Clinician: Referring Provider: Treating Provider/Extender: [name] in Treatment: 0 Verbal / Phone Orders: No Diagnosis Coding ICD-10 Coding Code Description [phone] Non-pressure chronic ulcer of left ankle with fat layer  exposed E11.10 Type 2 diabetes mellitus with ketoacidosis without coma E11.622 Type 2 diabetes mellitus with other skin ulcer Follow-up Appointments ppointment in 1 week. - Dr. Celine Church rm 4 Return A Monday 12/16/21 at 2:30 PM Anesthetic Wound #1 Left,Lateral Ankle (In clinic) Topical Lidocaine 5% applied to wound bed - prior to debridement Bathing/ Shower/ Hygiene May shower and wash wound with soap and water. - on days when the dressing is changed Wound Treatment Wound #1 - Ankle Wound Laterality: Left, Lateral Cleanser: Soap and Water Every Other Day/15 Days Discharge Instructions: May shower and wash wound with dial antibacterial soap and water prior to dressing change. Peri-Wound Care: Sween Lotion (Moisturizing lotion) Every Other Day/15 Days Discharge Instructions: Apply moisturizing lotion as directed Prim Dressing: IODOFLEX 0.9% Cadexomer Iodine Pad 4x6 cm (DME) (Dispense As Written) Every Other Day/15 Days ary Discharge Instructions: Apply to wound bed as instructed Secondary Dressing: Bordered Gauze, 4x4 in (DME) (Dispense As Written) Every Other Day/15 Days Discharge Instructions: Apply over primary dressing as directed. Secondary Dressing: Optifoam Non-Adhesive Dressing, 4x4 in (DME) (Dispense As Written) Every Other Day/15 Days Discharge Instructions: Apply over primary dressing as directed. Secured With: Elastic Bandage 4 inch (ACE bandage) Every Other Day/15 Days Discharge Instructions: Secure with ACE bandage as directed. Secured With: [employer], 4.5x3.1 (in/yd) (DME) (Dispense As Written) Every Other Day/15 Days Discharge Instructions: Secure with Kerlix as directed. Electronic Signature(s) Signed: 12/11/2021 12:32:45 PM By: Tina Maudlin MD FACS Previous Signature: 12/11/2021 12:20:36 PM Version By: Tina Maudlin MD FACS Entered By: [name] on 12/11/2021  12:32:45 -------------------------------------------------------------------------------- Problem List Details Patient Name: Date of Service: [name] Medical Record Number: [id number] Patient Account Number: [id number] Date of Birth/Sex: Treating RN: [birth year] (46 y.o. [name] Primary Care Provider: Vonita Church Other Clinician: Referring Provider: Treating Provider/Extender: [name] in Treatment: 0 Active Problems ICD-10 Encounter Code Description Active Date MDM Diagnosis [phone] Non-pressure chronic ulcer of left ankle with fat layer exposed 12/11/2021 No Yes E11.10 Type 2 diabetes mellitus with ketoacidosis without coma 12/11/2021 No Yes E11.622 Type 2 diabetes mellitus with other skin ulcer 12/11/2021 No Yes Inactive Problems Resolved Problems Electronic Signature(s) Signed: 12/11/2021 12:23:55 PM By: Tina Maudlin MD FACS Previous Signature: 12/11/2021 12:20:36 PM Version By: Tina Maudlin MD FACS Previous Signature: 12/11/2021 11:31:27 AM  Version By: Tina Maudlin MD FACS Entered By: [name] on 12/11/2021 12:23:54 -------------------------------------------------------------------------------- Progress Note Details Patient Name: Date of Service: [name] 12/11/2021 10:30 A Church Medical Record Number: [id number] Patient Account Number: [id number] Date of Birth/Sex: Treating RN: 1975-05-18 (46 y.o. [name] Primary Care Provider: Vonita Church Other Clinician: Referring Provider: Treating Provider/Extender: [name] in Treatment: 0 Subjective Chief Complaint Information obtained from Patient Patient seen for complaints of Non-Healing Wound. History of Present Illness (HPI) ADMISSION 12/11/2021 History is somewhat limited secondary to intellectual delay. Some of the history has been garnered from review of the electronic medical record.  The patient reports that she was sitting on pavement with her left foot tucked underneath her. She says that she developed a wound on that site. She saw her PCP. They note from that clinic date (11/13/2021: Says that the wound was open but dried without any drainage. Left foot edema was described. A 7-day course of Keflex was prescribed. She is followed by podiatry for routine diabetic foot care. When she saw them on August 15, the provider referred her to a different podiatrist who debrided the wound. He applied Iodosorb and prescribed Silvadene daily dressing changes. The patient states that she has not been changing her dressing. She apparently resides in some sort of facility and she says there are nurses and doctors there. Specific details are difficult to sort out. She denies any significant pain to the wound. On her left lateral malleolus, there is a circular wound that does seem consistent with the history provided. There is some slough buildup in the center of the wound. No fluctuance, odor, or purulent drainage. Patient History Unable to Obtain Patient History due to Altered Mental Status. Information obtained from Patient. Allergies No allergies have been documented for the patient General Notes: No known allergies Family History Diabetes - Mother,Father, Stroke - Siblings. Social History Never smoker, Marital Status - Single, Alcohol Use - Never, Drug Use - No History, Caffeine Use - Daily. Medical History Cardiovascular Patient has history of Hypertension Endocrine Patient has history of Type II Diabetes Psychiatric Denies history of Anorexia/bulimia, Confinement Anxiety Patient is treated with Insulin, Oral Agents. Blood sugar is tested. Hospitalization/Surgery History - Cholecystectomy. - pelvic mass. - ventral hernia/ hernia repair. - Hand surgery (right hand). Medical A Surgical History Notes nd Gastrointestinal GERD Endocrine DKA Giant ovarian mass Review of Systems  (ROS) Constitutional Symptoms (General Health) Denies complaints or symptoms of Fatigue, Fever, Chills, Marked Weight Change. Eyes Complains or has symptoms of Glasses / Contacts. Denies complaints or symptoms of Vision Changes. Psychiatric Denies complaints or symptoms of Claustrophobia. Objective Constitutional No acute distress. Vitals Time Taken: 11:08 AM, Height: 58 in, Source: Stated, Weight: 111 lbs, Source: Stated, BMI: 23.2, Temperature: 98.4 F, Pulse: 99 bpm, Respiratory Rate: 16 breaths/min, Blood Pressure: 98/64 mmHg, Capillary Blood Glucose: 89 mg/dl. Respiratory Normal work of breathing on room air.. General Notes: 12/11/2021: On her left lateral malleolus, there is a circular wound that does seem consistent with the history provided. There is some slough buildup in the center of the wound. No fluctuance, odor, or purulent drainage. Integumentary (Hair, Skin) Wound #1 status is Open. Original cause of wound was Laceration. The date acquired was: 10/24/2021. The wound is located on the Left,Lateral Ankle. The wound measures 0.7cm length x 1.2cm width x 0.2cm depth; 0.66cm^2 area and 0.132cm^3 volume. There is Fat Layer (Subcutaneous Tissue) exposed. There is no tunneling noted, however, there is  undermining starting at 3:00 and ending at 7:00 with a maximum distance of 0.5cm. There is a medium amount of serosanguineous drainage noted. The wound margin is flat and intact. There is medium (34-66%) granulation within the wound bed. There is a medium (34-66%) amount of necrotic tissue within the wound bed. Wound #1 status is Open. Original cause of wound was Laceration. The date acquired was: 10/24/2021. The wound is located on the Left,Lateral Ankle. The wound measures 0.7cm length x 1.2cm width x 0.2cm depth; 0.66cm^2 area and 0.132cm^3 volume. There is Fat Layer (Subcutaneous Tissue) exposed. There is no tunneling noted, however, there is undermining starting at 3:00 and ending at  7:00 with a maximum distance of 0.5cm. There is a medium amount of serosanguineous drainage noted. The wound margin is flat and intact. There is medium (34-66%) granulation within the wound bed. There is a medium (34-66%) amount of necrotic tissue within the wound bed. Assessment Active Problems ICD-10 Non-pressure chronic ulcer of left ankle with fat layer exposed Type 2 diabetes mellitus with ketoacidosis without coma Type 2 diabetes mellitus with other skin ulcer Procedures Wound #1 Pre-procedure diagnosis of Wound #1 is an Abrasion located on the Left,Lateral Ankle . There was a Selective/Open Wound Non-Viable Tissue Debridement with a total area of 0.84 sq cm performed by Tina Maudlin, MD. With the following instrument(s): Curette to remove Viable tissue/material. Material removed includes [facility] after achieving pain control using Lidocaine 5% topical ointment. No specimens were taken. A time out was conducted at 11:40, prior to the start of the procedure. A Minimum amount of bleeding was controlled with Pressure. The procedure was tolerated well with a pain level of 0 throughout and a pain level of 0 following the procedure. Post Debridement Measurements: 0.7cm length x 1.2cm width x 0.2cm depth; 0.132cm^3 volume. Character of Wound/Ulcer Post Debridement requires further debridement. Post procedure Diagnosis Wound #1: Same as Pre-Procedure Plan Follow-up Appointments: Return Appointment in 1 week. - Dr. Celine Church rm 4 Monday 12/16/21 at 2:30 PM Anesthetic: Wound #1 Left,Lateral Ankle: (In clinic) Topical Lidocaine 5% applied to wound bed - prior to debridement Bathing/ Shower/ Hygiene: May shower and wash wound with soap and water. - on days when the dressing is changed WOUND #1: - Ankle Wound Laterality: Left, Lateral Cleanser: Soap and Water Every Other Day/15 Days Discharge Instructions: May shower and wash wound with dial antibacterial soap and water prior to dressing  change. Peri-Wound Care: Sween Lotion (Moisturizing lotion) Every Other Day/15 Days Discharge Instructions: Apply moisturizing lotion as directed Prim Dressing: IODOFLEX 0.9% Cadexomer Iodine Pad 4x6 cm (DME) (Dispense As Written) Every Other Day/15 Days ary Discharge Instructions: Apply to wound bed as instructed Secondary Dressing: Bordered Gauze, 4x4 in (DME) (Dispense As Written) Every Other Day/15 Days Discharge Instructions: Apply over primary dressing as directed. Secondary Dressing: Optifoam Non-Adhesive Dressing, 4x4 in (DME) (Dispense As Written) Every Other Day/15 Days Discharge Instructions: Apply over primary dressing as directed. Secured With: Elastic Bandage 4 inch (ACE bandage) Every Other Day/15 Days Discharge Instructions: Secure with ACE bandage as directed. Secured With: [employer], 4.5x3.1 (in/yd) (DME) (Dispense As Written) Every Other Day/15 Days Discharge Instructions: Secure with Kerlix as directed. 12/11/2021: On her left lateral malleolus, there is a circular wound that does seem consistent with the history provided. There is some slough buildup in the center of the wound. No fluctuance, odor, or purulent drainage. I used a curette to debride slough from the wound. We will use Iodoflex with gauze and  an Ace bandage wrap. It is very unclear what resources are available to this patient and her living facility. Ideally, we would want her dressing changed on an every other day basis. It may be that she can only have it changed weekly at our office. We will look further into this. Plan to follow-up in 1 week's time. Electronic Signature(s) Signed: 12/11/2021 12:33:43 PM By: Tina Maudlin MD FACS Entered By: [name] on 12/11/2021 12:33:43 -------------------------------------------------------------------------------- HxROS Details Patient Name: Date of Service: [name] Medical Record Number: [id number] Patient Account  Number: [id number] Date of Birth/Sex: Treating RN: 1975-06-02 (46 y.o. [name] Primary Care Provider: Vonita Church Other Clinician: Referring Provider: Treating Provider/Extender: [name] in Treatment: 0 Unable to Obtain Patient History due to Altered Mental Status Information Obtained From Patient Constitutional Symptoms (General Health) Complaints and Symptoms: Negative for: Fatigue; Fever; Chills; Marked Weight Change Eyes Complaints and Symptoms: Positive for: Glasses / Contacts Negative for: Vision Changes Psychiatric Complaints and Symptoms: Negative for: Claustrophobia Medical History: Negative for: Anorexia/bulimia; Confinement Anxiety Cardiovascular Medical History: Positive for: Hypertension Gastrointestinal Medical History: Past Medical History Notes: GERD Endocrine Medical History: Positive for: Type II Diabetes Past Medical History Notes: DKA Giant ovarian mass Treated with: Insulin, Oral agents Blood sugar tested every day: Yes Tested : 2 Immunizations Pneumococcal Vaccine: Received Pneumococcal Vaccination: Yes Received Pneumococcal Vaccination On or After 60th Birthday: No Implantable Devices No devices added Hospitalization / Surgery History Type of Hospitalization/Surgery Cholecystectomy pelvic mass ventral hernia/ hernia repair Hand surgery (right hand) Family and Social History Diabetes: Yes - Mother,Father; Stroke: Yes - Siblings; Never smoker; Marital Status - Single; Alcohol Use: Never; Drug Use: No History; Caffeine Use: Daily; Financial Concerns: No; Food, Clothing or Shelter Needs: No; Transportation Concerns: No Physician Affirmation I have reviewed and agree with the above information. Electronic Signature(s) Signed: 12/11/2021 12:20:36 PM By: Tina Maudlin MD FACS Signed: 12/11/2021 4:47:34 PM By: Tina Creamer RN, BSN Signed: 12/12/2021 4:37:16 PM By: Blanche East RN Previous Signature:  12/11/2021 9:38:40 AM Version By: Tina Maudlin MD FACS Entered By: [name] on 12/11/2021 11:13:28 -------------------------------------------------------------------------------- SuperBill Details Patient Name: Date of Service: [name] 12/11/2021 Medical Record Number: [id number] Patient Account Number: [id number] Date of Birth/Sex: Treating RN: Aug 31, 1975 (46 y.o. [place] Nest[place], [place] Primary Care Provider: Vonita Church Other Clinician: Referring Provider: Treating Provider/Extender: [name] in Treatment: 0 Diagnosis Coding ICD-10 Codes Code Description [phone] Non-pressure chronic ulcer of left ankle with fat layer exposed E11.10 Type 2 diabetes mellitus with ketoacidosis without coma E11.622 Type 2 diabetes mellitus with other skin ulcer Facility Procedures CPT4 Code: [id number] Description: 99213 - WOUND CARE VISIT-LEV 3 EST PT Modifier: 25 Quantity: 1 CPT4 Code: [id number] Description: 56314 - DEBRIDE WOUND 1ST 20 SQ CM OR < ICD-10 Diagnosis Description L97.322 Non-pressure chronic ulcer of left ankle with fat layer exposed Modifier: Quantity: 1 Physician Procedures : CPT4 Code Description Modifier [id number] - WC PHYS LEVEL 4 - NEW PT 25 ICD-10 Diagnosis Description L97.322 Non-pressure chronic ulcer of left ankle with fat layer exposed E11.10 Type 2 diabetes mellitus with ketoacidosis without coma E11.622 Type  2 diabetes mellitus with other skin ulcer Quantity: 1 : [id number] - WC PHYS DEBR WO ANESTH 20 SQ CM ICD-10 Diagnosis Description L97.322 Non-pressure chronic ulcer of left ankle with fat layer exposed Quantity: 1 Electronic Signature(s) Signed: 12/11/2021 12:33:58 PM By: Tina Maudlin MD FACS Previous Signature:  12/11/2021 12:20:36 PM Version By: Tina Maudlin MD FACS Entered By: [name] on 12/11/2021 12:33:58

## 2021-12-12 NOTE — Progress Notes (Signed)
ZEVA, LEBER ([id number]) Visit Report for 12/11/2021 Allergy List Details Patient Name: Date of Service: [name] 12/11/2021 10:30 A M Medical Record Number: [id number] Patient Account Number: [id number] Date of Birth/Sex: Treating Church: 1975/04/13 (46 y.o. [name] Primary Care [name]: [name] Other Clinician: Referring [name]: Treating [name]/Extender: [name] in Treatment: 0 Allergies Active Allergies Inactive Allergies Allergy Notes No known allergies Electronic Signature(s) Signed: 12/11/2021 4:47:34 PM By: [name] Entered By: [name] on 12/11/2021 11:10:22 -------------------------------------------------------------------------------- Arrival Information Details Patient Name: Date of Service: [name] 12/11/2021 10:30 A M Medical Record Number: [id number] Patient Account Number: [id number] Date of Birth/Sex: Treating Church: 1975-06-08 (46 y.o. [name] Primary Care Paulette Rockford: [name] Other Clinician: Referring Bekka Qian: Treating [name]/Extender: [name] in Treatment: 0 Visit Information Patient Arrived: Ambulatory Arrival Time: 11:05 Accompanied By: self Transfer Assistance: None Patient Identification Verified: Yes Secondary Verification Process Completed: Yes Patient Requires Transmission-Based Precautions: No Patient Has Alerts: Yes Patient Alerts: ABI LLE 1.22 Electronic Signature(s) Signed: 12/12/2021 4:37:16 PM By: [name] Entered By: [name] on 12/11/2021 12:19:02 -------------------------------------------------------------------------------- Clinic Level of Care Assessment Details Patient Name: Date of Service: [name] 12/11/2021 10:30 A M Medical Record Number: [id number] Patient Account Number: [id number] Date of Birth/Sex: Treating Church: 11-17-1975 (46 y.o. [name] Primary Care  [name]: [name] Other Clinician: Referring Erion Hermans: Treating [name]/Extender: [name] in Treatment: 0 Clinic Level of Care Assessment Items TOOL 1 Quantity Score X- 1 0 Use when EandM and Procedure is performed on INITIAL visit ASSESSMENTS - Nursing Assessment / Reassessment X- 1 20 General Physical Exam (combine w/ comprehensive assessment (listed just below) when performed on new pt. evals) X- 1 25 Comprehensive Assessment (HX, ROS, Risk Assessments, Wounds Hx, etc.) ASSESSMENTS - Wound and Skin Assessment / Reassessment X- 1 10 Dermatologic / Skin Assessment (not related to wound area) ASSESSMENTS - Ostomy and/or Continence Assessment and Care '[]'$  - 0 Incontinence Assessment and Management '[]'$  - 0 Ostomy Care Assessment and Management (repouching, etc.) PROCESS - Coordination of Care X - Simple Patient / Family Education for ongoing care 1 15 '[]'$  - 0 Complex (extensive) Patient / Family Education for ongoing care X- 1 10 Staff obtains Programmer, systems, Records, T Results / Process Orders est X- 1 10 Staff telephones HHA, Nursing Homes / Clarify orders / etc '[]'$  - 0 Routine Transfer to another Facility (non-emergent condition) '[]'$  - 0 Routine Hospital Admission (non-emergent condition) '[]'$  - 0 New Admissions / Biomedical engineer / Ordering NPWT Apligraf, etc. , '[]'$  - 0 Emergency Hospital Admission (emergent condition) PROCESS - Special Needs '[]'$  - 0 Pediatric / Minor Patient Management '[]'$  - 0 Isolation Patient Management '[]'$  - 0 Hearing / Language / Visual special needs '[]'$  - 0 Assessment of Community assistance (transportation, D/C planning, etc.) '[]'$  - 0 Additional assistance / Altered mentation '[]'$  - 0 Support Surface(s) Assessment (bed, cushion, seat, etc.) INTERVENTIONS - Miscellaneous '[]'$  - 0 External ear exam '[]'$  - 0 Patient Transfer (multiple staff / Civil Service fast streamer / Similar devices) '[]'$  - 0 Simple Staple / Suture removal (25  or less) '[]'$  - 0 Complex Staple / Suture removal (26 or more) '[]'$  - 0 Hypo/Hyperglycemic Management (do not check if billed separately) X- 1 15 Ankle / Brachial Index (ABI) - do not check if billed separately Has the patient been seen at the hospital within the last three  years: Yes Total Score: 105 Level Of Care: New/Established - Level 3 Electronic Signature(s) Signed: 12/12/2021 4:37:16 PM By: [name] Entered By: [name] on 12/11/2021 12:20:29 -------------------------------------------------------------------------------- Encounter Discharge Information Details Patient Name: Date of Service: [name] 12/11/2021 10:30 A M Medical Record Number: [id number] Patient Account Number: [id number] Date of Birth/Sex: Treating Church: 07/27/1975 (46 y.o. [name] Primary Care [name]: [name] Other Clinician: Referring [name]: Treating [name]/Extender: [name] in Treatment: 0 Encounter Discharge Information Items Post Procedure Vitals Discharge Condition: Stable Temperature (F): 98.4 Ambulatory Status: Ambulatory Pulse (bpm): 99 Discharge Destination: Home Respiratory Rate (breaths/min): 16 Transportation: Private Auto Blood Pressure (mmHg): 98/64 Accompanied By: self Schedule Follow-up Appointment: Yes Clinical Summary of Care: Electronic Signature(s) Signed: 12/11/2021 4:47:34 PM By: [name] Entered By: [name] on 12/11/2021 12:02:40 -------------------------------------------------------------------------------- Lower Extremity Assessment Details Patient Name: Date of Service: [name] 12/11/2021 10:30 A M Medical Record Number: [id number] Patient Account Number: [id number] Date of Birth/Sex: Treating Church: [birth year] (46 y.o. [name] Primary Care [name]: [name] Other Clinician: Referring Pinchus Weckwerth: Treating [name]/Extender: [name] in Treatment: 0 Edema Assessment Assessed: [name]: No] [name]: No] E[Left: dema] [Right: :] Calf Left: Right: Point of Measurement: From Medial Instep 17 cm Ankle Left: Right: Point of Measurement: From Medial Instep 30 cm Vascular Assessment Pulses: Dorsalis Pedis Palpable: [Left:Yes] Blood Pressure: Brachial: [Left:98] Dorsalis Pedis: 120 Ankle: Posterior Tibial: 110 Ankle Brachial Index: [Left:1.22] Electronic Signature(s) Signed: 12/11/2021 4:47:34 PM By: [name] Entered By: [name] on 12/11/2021 11:24:41 -------------------------------------------------------------------------------- Multi Wound Chart Details Patient Name: Date of Service: [name] 12/11/2021 10:30 A M Medical Record Number: [id number] Patient Account Number: [id number] Date of Birth/Sex: Treating Church: [birth year] (46 y.o. [name] Primary Care Sabien Umland: [name] Other Clinician: Referring Ambar Raphael: Treating Kamiyah Kindel/Extender: [name] in Treatment: 0 Vital Signs Height(in): 29 Capillary Blood Glucose(mg/dl): 2 Weight(lbs): 111 Pulse(bpm): 99 Body Mass Index(BMI): 23.2 Blood Pressure(mmHg): 98/64 Temperature(F): 98.4 Respiratory Rate(breaths/min): 16 Photos: [1:No Photos Left, Lateral Ankle] [N/A:No Photos N/A Left, Lateral Ankle N/A] Wound Location: [1:Laceration] [N/A:Laceration N/A] Wounding Event: [1:Abrasion] [N/A:Abrasion N/A] Primary Etiology: [1:Hypertension, Type II Diabetes] [N/A:Hypertension, Type II Diabetes N/A] Comorbid History: [1:10/24/2021] [N/A:10/24/2021 N/A] Date Acquired: [1:0] [N/A:0 N/A] Weeks of Treatment: [1:Open] [N/A:Open N/A] Wound Status: [1:No] [N/A:No N/A] Wound Recurrence: [1:0.7x1.2x0.2] [N/A:0.7x1.2x0.2 N/A] Measurements L x W x D (cm) [1:0.66] [N/A:0.66 N/A] A (cm) : rea [1:0.132] [N/A:0.132 N/A] Volume (cm) : [1:0.00%] [N/A:0.00% N/A] % Reduction  in A rea: [1:0.00%] [N/A:0.00% N/A] % Reduction in Volume: [1:3] [N/A:3] Starting Position 1 (o'clock): [1:7] [N/A:7] Ending Position 1 (o'clock): [1:0.5] [N/A:0.5] Maximum Distance 1 (cm): [1:Yes] [N/A:Yes N/A] Undermining: [1:Full Thickness Without Exposed] [N/A:Full Thickness Without Exposed N/A] Classification: [1:Support Structures Medium] [N/A:Support Structures Medium N/A] Exudate Amount: [1:Serosanguineous] [N/A:Serosanguineous N/A] Exudate Type: [1:red, brown] [N/A:red, brown N/A] Exudate Color: [1:Flat and Intact] [N/A:Flat and Intact N/A] Wound Margin: [1:Medium (34-66%)] [N/A:Medium (34-66%) N/A] Granulation Amount: [1:Medium (34-66%)] [N/A:Medium (34-66%) N/A] Necrotic Amount: [1:Fat Layer (Subcutaneous Tissue): Yes Fat Layer (Subcutaneous Tissue): Yes N/A] Exposed Structures: [1:N/A] [N/A:Debridement - Selective/Open Wound N/A] Debridement: Pre-procedure Verification/Time Out N/A [N/A:11:40 N/A] Taken: [1:N/A] [N/A:Lidocaine 5% topical ointment N/A] Pain Control: [1:N/A] [N/A:Slough N/A] Tissue Debrided: [1:N/A] [N/A:Non-Viable Tissue N/A] Level: [1:N/A] [N/A:0.84 N/A] Debridement A (sq cm): [1:rea N/A] [N/A:Curette N/A] Instrument: [1:N/A] [N/A:Minimum N/A] Bleeding: [1:N/A] [N/A:Pressure N/A] Hemostasis A chieved: [1:N/A] [  N/A:0 N/A] Procedural Pain: [1:N/A] [N/A:0 N/A] Post Procedural Pain: [1:N/A] [N/A:Procedure was tolerated well N/A] Debridement Treatment Response: [1:N/A] [N/A:0.7x1.2x0.2 N/A] Post Debridement Measurements L x W x D (cm) [1:N/A] [N/A:0.132 N/A] Post Debridement Volume: (cm) [1:N/A] [N/A:Debridement N/A] Treatment Notes Wound #1 (Ankle) Wound Laterality: Left, Lateral Cleanser Soap and Water Discharge Instruction: May shower and wash wound with dial antibacterial soap and water prior to dressing change. Peri-Wound Care Sween Lotion (Moisturizing lotion) Discharge Instruction: Apply moisturizing lotion as directed Topical Primary  Dressing IODOFLEX 0.9% Cadexomer Iodine Pad 4x6 cm Discharge Instruction: Apply to wound bed as instructed Secondary Dressing Bordered Gauze, 4x4 in Discharge Instruction: Apply over primary dressing as directed. Optifoam Non-Adhesive Dressing, 4x4 in Discharge Instruction: Apply over primary dressing as directed. Secured With Elastic Bandage 4 inch (ACE bandage) Discharge Instruction: Secure with ACE bandage as directed. Kerlix Roll Sterile, 4.5x3.1 (in/yd) Discharge Instruction: Secure with Kerlix as directed. Compression Wrap Compression Stockings Add-Ons Electronic Signature(s) Signed: 12/11/2021 12:24:03 PM By: Fredirick Maudlin MD FACS Signed: 12/12/2021 4:37:16 PM By: [name] Entered By: Fredirick Maudlin on 12/11/2021 12:24:03 -------------------------------------------------------------------------------- Multi-Disciplinary Care Plan Details Patient Name: Date of Service: Tina Demark [facility] Church 12/11/2021 10:30 A M Medical Record Number: [id number] Patient Account Number: [id number] Date of Birth/Sex: Treating Church: [birth year] (46 y.o. [name] Primary Care Kasey Church: [name] Other Clinician: Referring Korin Hartwell: Treating Abdullah Rizzi/Extender: [name] in Treatment: 0 Active Inactive Orientation to the Wound Care Program Nursing Diagnoses: Knowledge deficit related to the wound healing center program Goals: Patient/caregiver will verbalize understanding of the [place] Program Date Initiated: 12/11/2021 Target Resolution Date: 01/08/2022 Goal Status: Active Interventions: Provide education on orientation to the wound center Notes: Wound/Skin Impairment Nursing Diagnoses: Knowledge deficit related to ulceration/compromised skin integrity Goals: Ulcer/skin breakdown will have a volume reduction of 30% by week 4 Date Initiated: 12/11/2021 Target Resolution Date: 01/08/2022 Goal Status:  Active Interventions: Assess patient/caregiver ability to obtain necessary supplies Assess ulceration(s) every visit Treatment Activities: Skin care regimen initiated : 12/11/2021 Notes: Electronic Signature(s) Signed: 12/11/2021 4:47:34 PM By: [name] Entered By: [name] on 12/11/2021 11:36:43 -------------------------------------------------------------------------------- Pain Assessment Details Patient Name: Date of Service: [name] 12/11/2021 10:30 A M Medical Record Number: [id number] Patient Account Number: [id number] Date of Birth/Sex: Treating Church: 07-19-1975 (46 y.o. [name] Primary Care Monserrate Blaschke: [name] Other Clinician: Referring Itzae Miralles: Treating Beniah Magnan/Extender: [name] in Treatment: 0 Active Problems Location of Pain Severity and Description of Pain Patient Has Paino Yes Site Locations Pain Management and Medication Current Pain Management: Electronic Signature(s) Signed: 12/11/2021 4:47:34 PM By: [name] Entered By: [name] on 12/11/2021 11:35:35 -------------------------------------------------------------------------------- Patient/Caregiver Education Details Patient Name: Date of Service: [name] 9/6/2023andnbsp10:30 A M Medical Record Number: [id number] Patient Account Number: [id number] Date of Birth/Gender: Treating Church: 1975/06/14 (46 y.o. [name] Primary Care Physician: [name] Other Clinician: Referring Physician: Treating Physician/Extender: [name] in Treatment: 0 Education Assessment Education Provided To: Patient Education Topics Provided Welcome T [facility]: o Handouts: Welcome T [facility] o Methods: Explain/Verbal Responses: Reinforcements needed, State content correctly Wound/Skin Impairment: Handouts: Caring for Your Ulcer, Skin Care Do's and  Dont's Methods: Explain/Verbal Responses: Reinforcements needed, State content correctly Electronic Signature(s) Signed: 12/11/2021 4:47:34 PM By: [name] Entered By: [name] on 12/11/2021 11:37:06 -------------------------------------------------------------------------------- Wound Assessment Details Patient Name: Date  of Service: [name] 12/11/2021 10:30 A M Medical Record Number: [id number] Patient Account Number: [id number] Date of Birth/Sex: Treating Church: Aug 26, 1975 (46 y.o. [name] Primary Care Dequante Tremaine: [name] Other Clinician: Referring Urie Loughner: Treating Tavari Loadholt/Extender: [name] in Treatment: 0 Wound Status Wound Number: 1 Primary Etiology: Abrasion Wound Location: Left, Lateral Ankle Wound Status: Open Wounding Event: Laceration Comorbid History: Hypertension, Type II Diabetes Date Acquired: 10/24/2021 Weeks Of Treatment: 0 Clustered Wound: No Wound Measurements Length: (cm) 0.7 Width: (cm) 1.2 Depth: (cm) 0.2 Area: (cm) 0.66 Volume: (cm) 0.132 % Reduction in Area: 0% % Reduction in Volume: 0% Tunneling: No Undermining: Yes Starting Position (o'clock): 3 Ending Position (o'clock): 7 Maximum Distance: (cm) 0.5 Wound Description Classification: Full Thickness Without Exposed Support Structures Wound Margin: Flat and Intact Exudate Amount: Medium Exudate Type: Serosanguineous Exudate Color: red, brown Foul Odor After Cleansing: No Slough/Fibrino Yes Wound Bed Granulation Amount: Medium (34-66%) Exposed Structure Necrotic Amount: Medium (34-66%) Fat Layer (Subcutaneous Tissue) Exposed: Yes Electronic Signature(s) Signed: 12/11/2021 4:47:34 PM By: [name] Entered By: [name] on 12/11/2021 11:35:08 -------------------------------------------------------------------------------- Wound Assessment Details Patient Name: Date of Service: [name] 12/11/2021 10:30 A M Medical Record Number: [id number] Patient Account Number: [id number] Date of Birth/Sex: Treating Church: Dec 27, 1975 (46 y.o. [name] Primary Care Davie Claud: [name] Other Clinician: Referring Natassja Ollis: Treating Halen Mossbarger/Extender: [name] in Treatment: 0 Wound Status Wound Number: 1 Primary Etiology: Abrasion Wound Location: Left, Lateral Ankle Wound Status: Open Wounding Event: Laceration Comorbid History: Hypertension, Type II Diabetes Date Acquired: 10/24/2021 Weeks Of Treatment: 0 Clustered Wound: No Wound Measurements Length: (cm) 0.7 Width: (cm) 1.2 Depth: (cm) 0.2 Area: (cm) 0.66 Volume: (cm) 0.132 % Reduction in Area: 0% % Reduction in Volume: 0% Tunneling: No Undermining: Yes Starting Position (o'clock): 3 Ending Position (o'clock): 7 Maximum Distance: (cm) 0.5 Wound Description Classification: Full Thickness Without Exposed Support Structures Wound Margin: Flat and Intact Exudate Amount: Medium Exudate Type: Serosanguineous Exudate Color: red, brown Foul Odor After Cleansing: No Slough/Fibrino Yes Wound Bed Granulation Amount: Medium (34-66%) Exposed Structure Necrotic Amount: Medium (34-66%) Fat Layer (Subcutaneous Tissue) Exposed: Yes Treatment Notes Wound #1 (Ankle) Wound Laterality: Left, Lateral Cleanser Soap and Water Discharge Instruction: May shower and wash wound with dial antibacterial soap and water prior to dressing change. Peri-Wound Care Sween Lotion (Moisturizing lotion) Discharge Instruction: Apply moisturizing lotion as directed Topical Primary Dressing IODOFLEX 0.9% Cadexomer Iodine Pad 4x6 cm Discharge Instruction: Apply to wound bed as instructed Secondary Dressing Bordered Gauze, 4x4 in Discharge Instruction: Apply over primary dressing as directed. Optifoam Non-Adhesive Dressing, 4x4 in Discharge Instruction: Apply over primary dressing as directed. Secured  With Elastic Bandage 4 inch (ACE bandage) Discharge Instruction: Secure with ACE bandage as directed. Kerlix Roll Sterile, 4.5x3.1 (in/yd) Discharge Instruction: Secure with Kerlix as directed. Compression Wrap Compression Stockings Add-Ons Electronic Signature(s) Signed: 12/11/2021 4:47:34 PM By: [name] Entered By: [name] on 12/11/2021 11:35:17 -------------------------------------------------------------------------------- Vitals Details Patient Name: Date of Service: [name] 12/11/2021 10:30 A M Medical Record Number: [id number] Patient Account Number: [id number] Date of Birth/Sex: Treating Church: 04/16/1975 (46 y.o. [name] Primary Care Olney Monier: [name] Other Clinician: Referring Annis Lagoy: Treating Jessalyn Hinojosa/Extender: [name] in Treatment: 0 Vital Signs Time Taken: 11:08 Temperature (F): 98.4 Height (in): 58 Pulse (bpm): 99 Source: Stated Respiratory Rate (breaths/min): 16 Weight (lbs): 111 Blood Pressure (mmHg): 98/64  Source: Stated Capillary Blood Glucose (mg/dl): 89 Body Mass Index (BMI): 23.2 Reference Range: 80 - 120 mg / dl Electronic Signature(s) Signed: 12/11/2021 4:47:34 PM By: [name] Entered By: [name] on 12/11/2021 11:10:13

## 2021-12-16 ENCOUNTER — Encounter (HOSPITAL_BASED_OUTPATIENT_CLINIC_OR_DEPARTMENT_OTHER): Payer: Medicaid Other | Admitting: General Surgery

## 2021-12-16 DIAGNOSIS — E11622 Type 2 diabetes mellitus with other skin ulcer: Secondary | ICD-10-CM | POA: Diagnosis not present

## 2021-12-17 ENCOUNTER — Encounter (HOSPITAL_COMMUNITY): Payer: Self-pay | Admitting: Family Medicine

## 2021-12-17 ENCOUNTER — Emergency Department (HOSPITAL_COMMUNITY): Payer: Medicaid Other

## 2021-12-17 ENCOUNTER — Observation Stay (HOSPITAL_COMMUNITY)
Admission: EM | Admit: 2021-12-17 | Discharge: 2021-12-18 | Disposition: A | Discharge status: Home / Self Care | Payer: Medicaid Other | Attending: Family Medicine | Admitting: Family Medicine

## 2021-12-17 DIAGNOSIS — L97329 Non-pressure chronic ulcer of left ankle with unspecified severity: Secondary | ICD-10-CM | POA: Diagnosis not present

## 2021-12-17 DIAGNOSIS — E861 Hypovolemia: Secondary | ICD-10-CM | POA: Diagnosis not present

## 2021-12-17 DIAGNOSIS — N179 Acute kidney failure, unspecified: Secondary | ICD-10-CM | POA: Diagnosis present

## 2021-12-17 DIAGNOSIS — Z79899 Other long term (current) drug therapy: Secondary | ICD-10-CM | POA: Diagnosis not present

## 2021-12-17 DIAGNOSIS — E860 Dehydration: Secondary | ICD-10-CM | POA: Diagnosis not present

## 2021-12-17 DIAGNOSIS — Z7984 Long term (current) use of oral hypoglycemic drugs: Secondary | ICD-10-CM | POA: Diagnosis not present

## 2021-12-17 DIAGNOSIS — N390 Urinary tract infection, site not specified: Secondary | ICD-10-CM | POA: Diagnosis not present

## 2021-12-17 DIAGNOSIS — E119 Type 2 diabetes mellitus without complications: Secondary | ICD-10-CM | POA: Diagnosis not present

## 2021-12-17 DIAGNOSIS — I9589 Other hypotension: Secondary | ICD-10-CM | POA: Diagnosis not present

## 2021-12-17 DIAGNOSIS — Z794 Long term (current) use of insulin: Secondary | ICD-10-CM

## 2021-12-17 DIAGNOSIS — N3090 Cystitis, unspecified without hematuria: Secondary | ICD-10-CM

## 2021-12-17 DIAGNOSIS — Z7982 Long term (current) use of aspirin: Secondary | ICD-10-CM | POA: Insufficient documentation

## 2021-12-17 DIAGNOSIS — I10 Essential (primary) hypertension: Secondary | ICD-10-CM | POA: Insufficient documentation

## 2021-12-17 DIAGNOSIS — I959 Hypotension, unspecified: Secondary | ICD-10-CM | POA: Diagnosis not present

## 2021-12-17 DIAGNOSIS — R55 Syncope and collapse: Secondary | ICD-10-CM | POA: Diagnosis present

## 2021-12-17 LAB — URINALYSIS, ROUTINE W REFLEX MICROSCOPIC
Bilirubin Urine: NEGATIVE
Glucose, UA: NEGATIVE mg/dL
Ketones, ur: NEGATIVE mg/dL
Nitrite: NEGATIVE
Protein, ur: >=300 mg/dL — AB
Specific Gravity, Urine: 1.018 (ref 1.005–1.030)
WBC, UA: >50 WBC/hpf — ABNORMAL HIGH (ref 0–5)
pH: 5 (ref 5.0–8.0)

## 2021-12-17 LAB — CBC WITH DIFFERENTIAL/PLATELET
Abs Immature Granulocytes: 0.02 10*3/uL (ref 0.00–0.07)
Basophils Absolute: 0 10*3/uL (ref 0.0–0.1)
Basophils Relative: 0 %
Eosinophils Absolute: 0.1 10*3/uL (ref 0.0–0.5)
Eosinophils Relative: 2 %
HCT: 30.2 % — ABNORMAL LOW (ref 36.0–46.0)
Hemoglobin: 9 g/dL — ABNORMAL LOW (ref 12.0–15.0)
Immature Granulocytes: 0 %
Lymphocytes Relative: 19 %
Lymphs Abs: 1.5 10*3/uL (ref 0.7–4.0)
MCH: 22.1 pg — ABNORMAL LOW (ref 26.0–34.0)
MCHC: 29.8 g/dL — ABNORMAL LOW (ref 30.0–36.0)
MCV: 74.2 fL — ABNORMAL LOW (ref 80.0–100.0)
Monocytes Absolute: 0.7 10*3/uL (ref 0.1–1.0)
Monocytes Relative: 9 %
Neutro Abs: 5.5 10*3/uL (ref 1.7–7.7)
Neutrophils Relative %: 70 %
Platelets: 467 10*3/uL — ABNORMAL HIGH (ref 150–400)
RBC: 4.07 MIL/uL (ref 3.87–5.11)
RDW: 18.3 % — ABNORMAL HIGH (ref 11.5–15.5)
WBC: 7.8 10*3/uL (ref 4.0–10.5)
nRBC: 0 % (ref 0.0–0.2)

## 2021-12-17 LAB — PROTIME-INR
INR: 1 (ref 0.8–1.2)
Prothrombin Time: 13.5 seconds (ref 11.4–15.2)

## 2021-12-17 LAB — I-STAT BETA HCG BLOOD, ED (MC, WL, AP ONLY): I-stat hCG, quantitative: <5 m[IU]/mL (ref <5)

## 2021-12-17 LAB — COMPREHENSIVE METABOLIC PANEL
ALT: 13 U/L (ref 0–44)
AST: 23 U/L (ref 15–41)
Albumin: 3.8 g/dL (ref 3.5–5.0)
Alkaline Phosphatase: 72 U/L (ref 38–126)
Anion gap: 9 (ref 5–15)
BUN: 25 mg/dL — ABNORMAL HIGH (ref 6–20)
CO2: 23 mmol/L (ref 22–32)
Calcium: 9.5 mg/dL (ref 8.9–10.3)
Chloride: 107 mmol/L (ref 98–111)
Creatinine, Ser: 1.2 mg/dL — ABNORMAL HIGH (ref 0.44–1.00)
GFR, Estimated: 57 mL/min — ABNORMAL LOW (ref >60)
Glucose, Bld: 103 mg/dL — ABNORMAL HIGH (ref 70–99)
Potassium: 4.4 mmol/L (ref 3.5–5.1)
Sodium: 139 mmol/L (ref 135–145)
Total Bilirubin: 0.6 mg/dL (ref 0.3–1.2)
Total Protein: 8.7 g/dL — ABNORMAL HIGH (ref 6.5–8.1)

## 2021-12-17 LAB — TROPONIN I (HIGH SENSITIVITY)
Troponin I (High Sensitivity): 3 ng/L (ref <18)
Troponin I (High Sensitivity): 3 ng/L (ref <18)

## 2021-12-17 LAB — MAGNESIUM: Magnesium: 1.9 mg/dL (ref 1.7–2.4)

## 2021-12-17 LAB — LACTIC ACID, PLASMA: Lactic Acid, Venous: 1.8 mmol/L (ref 0.5–1.9)

## 2021-12-17 LAB — LIPASE, BLOOD: Lipase: 37 U/L (ref 11–51)

## 2021-12-17 MED ORDER — ACETAMINOPHEN 650 MG RE SUPP: 650.0000 mg | Freq: Four times a day (QID) | RECTAL | Status: DC | PRN

## 2021-12-17 MED ORDER — SENNOSIDES-DOCUSATE SODIUM 8.6-50 MG PO TABS: 1.0000 | ORAL_TABLET | Freq: Every evening | ORAL | Status: DC | PRN

## 2021-12-17 MED ORDER — SODIUM CHLORIDE 0.9 % IV SOLN
1.0000 g | Freq: Once | INTRAVENOUS | Status: AC
  Administered 2021-12-17: 1 g via INTRAVENOUS
  Filled 2021-12-17: qty 10

## 2021-12-17 MED ORDER — ONDANSETRON HCL 4 MG/2ML IJ SOLN: 4.0000 mg | Freq: Four times a day (QID) | INTRAMUSCULAR | Status: DC | PRN

## 2021-12-17 MED ORDER — LACTATED RINGERS IV BOLUS
1000.0000 mL | Freq: Once | INTRAVENOUS | Status: AC
  Administered 2021-12-17: 1000 mL via INTRAVENOUS

## 2021-12-17 MED ORDER — PRAVASTATIN SODIUM 20 MG PO TABS: 20.0000 mg | ORAL_TABLET | Freq: Every day | ORAL | Status: DC

## 2021-12-17 MED ORDER — ASPIRIN 81 MG PO TBEC: 81.0000 mg | DELAYED_RELEASE_TABLET | Freq: Every day | ORAL | Status: DC

## 2021-12-17 MED ORDER — FLUCONAZOLE 200 MG PO TABS
200.0000 mg | ORAL_TABLET | Freq: Once | ORAL | Status: AC
  Administered 2021-12-18: 200 mg via ORAL
  Filled 2021-12-17: qty 1

## 2021-12-17 MED ORDER — SODIUM CHLORIDE 0.9% FLUSH
3.0000 mL | Freq: Two times a day (BID) | INTRAVENOUS | Status: DC
  Administered 2021-12-18: 3 mL via INTRAVENOUS

## 2021-12-17 MED ORDER — LACTATED RINGERS IV SOLN: INTRAVENOUS | Status: AC

## 2021-12-17 MED ORDER — PANTOPRAZOLE SODIUM 40 MG PO TBEC: 40.0000 mg | DELAYED_RELEASE_TABLET | Freq: Every day | ORAL | Status: DC

## 2021-12-17 MED ORDER — LACTATED RINGERS IV SOLN: INTRAVENOUS | Status: DC

## 2021-12-17 MED ORDER — SODIUM CHLORIDE 0.9 % IV SOLN: 1.0000 g | INTRAVENOUS | Status: DC

## 2021-12-17 MED ORDER — ENOXAPARIN SODIUM 40 MG/0.4ML IJ SOSY: 40.0000 mg | PREFILLED_SYRINGE | INTRAMUSCULAR | Status: DC

## 2021-12-17 MED ORDER — ACETAMINOPHEN 325 MG PO TABS: 650.0000 mg | ORAL_TABLET | Freq: Four times a day (QID) | ORAL | Status: DC | PRN

## 2021-12-17 MED ORDER — ONDANSETRON HCL 4 MG PO TABS: 4.0000 mg | ORAL_TABLET | Freq: Four times a day (QID) | ORAL | Status: DC | PRN

## 2021-12-17 NOTE — ED Provider Notes (Signed)
[facility] DEPT Provider Note   CSN: [id number] Arrival date & time: 12/17/21  1816     History {Add pertinent medical, surgical, social history, OB history to HPI:1} Chief Complaint  Patient presents with   Hypotension    [name] is a 46 y.o. female.  HPI Patient presents for near syncope.  Medical history includes DM, GERD, HTN, HLD, DiGeorge syndrome, homelessness.  Recently, she underwent debridement of the left lateral ankle wound 6 days ago.  She denies any worsened pain to this area.  She reports that she was in her normal state of health earlier today.  This afternoon, she did have some chest discomfort.  She also endorses left shoulder pain.  The symptoms have since resolved.  She took her afternoon medications at 4 PM, including blood pressure medicine.  Following that, she felt dizzy.  She was told that it did not look like she was breathing.  Patient does remember this episode so not consistent with syncope.  History is limited by intellectual delay.    Home Medications Prior to Admission medications   Medication Sig Start Date End Date Taking? Authorizing Provider  Insulin Syringe-Needle U-100 30G X 5/16" 0.5 ML MISC Use to administer insulin 2 (two) times daily. 07/03/21   Elsie Stain, MD  Accu-Chek Softclix Lancets lancets Use 3 (three) times daily as directed 08/08/21   Elsie Stain, MD  ASPIRIN LOW DOSE 81 MG EC tablet Take 1 tablet (81 mg total) by mouth daily. 07/24/21   Elsie Stain, MD  azithromycin (ZITHROMAX) 250 MG tablet Take two tablets on day 1 then one daily until gone 08/07/21   Elsie Stain, MD  Blood Glucose Monitoring Suppl (ACCU-CHEK GUIDE) w/Device KIT Use to check blood sugar 3 (three) times daily. 07/03/21   Elsie Stain, MD  EASY COMFORT PEN NEEDLES 31G X 5 MM MISC USE TWICE A DAY WITH INSULIN PEN 12/30/17   [provider]  ferrous sulfate 220 (44 Fe) MG/5ML solution Take by  mouth. Patient not taking: Reported on 07/03/2021 10/11/20   [provider]  fluticasone (FLONASE) 50 MCG/ACT nasal spray Place 1 spray into both nostrils daily. 08/01/21   Elodia Florence., MD  glucose blood (ACCU-CHEK GUIDE) test strip Use 3 (three) times daily as instructed 07/03/21   Elsie Stain, MD  ibuprofen (ADVIL,MOTRIN) 600 MG tablet Take 600 mg by mouth every 6 (six) hours as needed for headache or mild pain.  Patient not taking: Reported on 07/03/2021    [provider]  insulin glargine (LANTUS) 100 UNIT/ML injection Inject 0.3 mLs (30 Units total) into the skin in the morning AND 0.2 mLs (20 Units total) every evening. 07/03/21   Elsie Stain, MD  Insulin Syringe-Needle U-100 (INSULIN SYRINGE .5CC/31GX5/16") 31G X 5/16" 0.5 ML MISC Inject 1 Syringe into the skin daily. 09/18/21   Elsie Stain, MD  Lancets Misc. (ACCU-CHEK SOFTCLIX LANCET DEV) KIT Use to check blood sugar twice a day 07/24/21   Elsie Stain, MD  lisinopril (ZESTRIL) 10 MG tablet Take 10 mg by mouth daily. 11/07/21   [provider]  metFORMIN (GLUCOPHAGE) 500 MG tablet Take 1 tablet (500 mg total) by mouth 2 (two) times daily with a meal. 07/24/21   Elsie Stain, MD  omeprazole (PRILOSEC) 2 mg/mL SUSP Take 10 mLs (20 mg total) by mouth daily. 06/26/17   Emeline General, PA-C  omeprazole (PRILOSEC) 20 MG capsule Take  1 capsule (20 mg total) by mouth daily. 06/26/17   Avie Echevaria B, PA-C  ondansetron (ZOFRAN) 4 MG tablet Take 1 tablet (4 mg total) by mouth every 6 (six) hours. Patient not taking: Reported on 01/12/2019 02/07/17   Nat Christen, MD  pravastatin (PRAVACHOL) 20 MG tablet Take by mouth.    [provider]  senna (SENOKOT) 8.6 MG TABS tablet Take 1 tablet by mouth daily as needed for mild constipation.  Patient not taking: Reported on 07/03/2021    [provider]  silver sulfADIAZINE (SILVADENE) 1 % cream Apply 1 application topically to  wound on both ankles once daily. 07/03/21   Elsie Stain, MD      Allergies    Patient has no known allergies.    Review of Systems   Review of Systems  Cardiovascular:  Positive for chest pain.  Musculoskeletal:  Positive for arthralgias.  Neurological:  Positive for dizziness and light-headedness.  All other systems reviewed and are negative.   Physical Exam Updated Vital Signs BP (!) 93/54 (BP Location: Right Arm)   Pulse 94   Temp 98.6 F (37 C) (Oral)   Resp 18   SpO2 100%  Physical Exam Vitals and nursing note reviewed.  Constitutional:      General: She is not in acute distress.    Appearance: She is well-developed. She is not ill-appearing, toxic-appearing or diaphoretic.  HENT:     Head: Normocephalic and atraumatic.     Right Ear: External ear normal.     Left Ear: External ear normal.     Nose: Nose normal.     Mouth/Throat:     Mouth: Mucous membranes are moist.     Pharynx: Oropharynx is clear.  Eyes:     Extraocular Movements: Extraocular movements intact.     Conjunctiva/sclera: Conjunctivae normal.  Cardiovascular:     Rate and Rhythm: Normal rate and regular rhythm.     Heart sounds: No murmur heard. Pulmonary:     Effort: Pulmonary effort is normal. No respiratory distress.     Breath sounds: Normal breath sounds. No wheezing or rales.  Chest:     Chest wall: No tenderness.  Abdominal:     General: There is no distension.     Palpations: Abdomen is soft.     Tenderness: There is no abdominal tenderness.  Musculoskeletal:        General: No swelling.     Cervical back: Normal range of motion and neck supple.     Right lower leg: No edema.     Left lower leg: No edema.     Comments: Congenital deformities consistent with DiGeorge syndrome.  Skin:    General: Skin is warm and dry.     Coloration: Skin is not jaundiced or pale.     Comments: Well-healing wound to lateral malleolus of left ankle.  Neurological:     General: No focal  deficit present.     Mental Status: She is alert and oriented to person, place, and time.     Cranial Nerves: No cranial nerve deficit.     Sensory: No sensory deficit.     Motor: No weakness.  Psychiatric:        Mood and Affect: Mood normal.        Behavior: Behavior normal.     ED Results / Procedures / Treatments   Labs (all labs ordered are listed, but only abnormal results are displayed) Labs Reviewed - No data to  display  EKG None  Radiology No results found.  Procedures Procedures  {Document cardiac monitor, telemetry assessment procedure when appropriate:1}  Medications Ordered in ED Medications - No data to display  ED Course/ Medical Decision Making/ A&P                           Medical Decision Making  ***  {Document critical care time when appropriate:1} {Document review of labs and clinical decision tools ie heart score, Chads2Vasc2 etc:1}  {Document your independent review of radiology images, and any outside records:1} {Document your discussion with family members, caretakers, and with consultants:1} {Document social determinants of health affecting pt's care:1} {Document your decision making why or why not admission, treatments were needed:1} Final Clinical Impression(s) / ED Diagnoses Final diagnoses:  None    Rx / DC Orders ED Discharge Orders     None

## 2021-12-17 NOTE — H&P (Signed)
History and Physical    [name] ZOX:[id number] DOB: 11/17/1975 DOA: 12/17/2021  PCP: Worthy Flank, NP   Patient coming from: Homeless shelter   Chief Complaint: Lightheaded   HPI: [name] is a pleasant 46 y.o. female with medical history significant for DiGeorge syndrome, insulin-dependent DM, hypertension, and hyperlipidemia who presents to the emergency department for evaluation of lightheadedness.  Patient reports that she was having some general malaise with lightheadedness and aches this morning but it was not that severe until this afternoon when she felt as though she was about to pass out.  She reports recent dysuria but denies fever or chills.  She denies cough or shortness of breath.  ED Course: Upon arrival to the ED, patient is found to be afebrile and saturating well on room air blood pressure as low as 85/45.  EKG features sinus rhythm and chest x-ray negative for acute cardiopulmonary disease.  Chemistry panel notable for creatinine 1.20.  CBC with hemoglobin 9.0, MCV 74.2, and platelets 467,000.  Lactic acid was normal and troponin was normal x2.  Urine was turbid with bacteriuria and pyuria.  Urine was sent for culture and the patient was given 2 L of LR and 1 g IV Rocephin.  Review of Systems:  All other systems reviewed and apart from HPI, are negative.  Past Medical History:  Diagnosis Date   Diabetes mellitus without complication (HCC)    DiGeorge syndrome (HCC)    Ectrodactyly    Genetic defect    genetic mutation of unknown significance per [facility] genetic notes   GERD (gastroesophageal reflux disease)    Hypercholesterolemia    Hypertension    Intellectual disability    Microcephaly (HCC)    Pelvic mass    Ventral hernia     Past Surgical History:  Procedure Laterality Date   CHOLECYSTECTOMY     HAND SURGERY Right    HERNIA REPAIR      Social History:   reports that she has never smoked. She has never used smokeless tobacco. She  reports that she does not drink alcohol and does not use drugs.  No Known Allergies  Family History  Problem Relation Age of Onset   Diabetes Mother    GER disease Mother    Diabetes Father      Prior to Admission medications   Medication Sig Start Date End Date Taking? Authorizing Provider  Acetaminophen 500 MG capsule Take 500-1,000 mg by mouth every 6 (six) hours as needed for pain.   Yes [provider]  ASPIRIN LOW DOSE 81 MG EC tablet Take 1 tablet (81 mg total) by mouth daily. 07/24/21  Yes Storm Frisk, MD  fluticasone (FLONASE) 50 MCG/ACT nasal spray Place 1 spray into both nostrils daily. Patient taking differently: Place 1 spray into both nostrils daily as needed for allergies or rhinitis. 08/01/21  Yes Zigmund Daniel., MD  insulin glargine (LANTUS) 100 UNIT/ML injection Inject 0.3 mLs (30 Units total) into the skin in the morning AND 0.2 mLs (20 Units total) every evening. Patient taking differently: Inject 40 units into the skin in the morning and 30 units at bedtime 07/03/21  Yes Storm Frisk, MD  Insulin Syringe-Needle U-100 30G X 5/16" 0.5 ML MISC Use to administer insulin 2 (two) times daily. 07/03/21   Storm Frisk, MD  lisinopril (ZESTRIL) 10 MG tablet Take 10 mg by mouth daily. 11/07/21  Yes [provider]  metFORMIN (GLUCOPHAGE) 500 MG tablet Take 1 tablet (500  mg total) by mouth 2 (two) times daily with a meal. 07/24/21  Yes Storm Frisk, MD  omeprazole (PRILOSEC) 20 MG capsule Take 1 capsule (20 mg total) by mouth daily. Patient taking differently: Take 20 mg by mouth daily before breakfast. 06/26/17  Yes Mathews Robinsons B, PA-C  pravastatin (PRAVACHOL) 20 MG tablet Take by mouth.   Yes [provider]  Accu-Chek Softclix Lancets lancets Use 3 (three) times daily as directed 08/08/21   Storm Frisk, MD  Blood Glucose Monitoring Suppl (ACCU-CHEK GUIDE) w/Device KIT Use to check blood sugar 3 (three) times daily.  07/03/21   Storm Frisk, MD  EASY COMFORT PEN NEEDLES 31G X 5 MM MISC USE TWICE A DAY WITH INSULIN PEN 12/30/17   [provider]  glucose blood (ACCU-CHEK GUIDE) test strip Use 3 (three) times daily as instructed 07/03/21   Storm Frisk, MD  Insulin Syringe-Needle U-100 (INSULIN SYRINGE .5CC/31GX5/16") 31G X 5/16" 0.5 ML MISC Inject 1 Syringe into the skin daily. 09/18/21   Storm Frisk, MD  Lancets Misc. (ACCU-CHEK SOFTCLIX LANCET DEV) KIT Use to check blood sugar twice a day 07/24/21   Storm Frisk, MD  ondansetron (ZOFRAN) 4 MG tablet Take 1 tablet (4 mg total) by mouth every 6 (six) hours. Patient not taking: Reported on 12/17/2021 02/07/17   Donnetta Hutching, MD  silver sulfADIAZINE (SILVADENE) 1 % cream Apply 1 application topically to wound on both ankles once daily. Patient not taking: Reported on 12/17/2021 07/03/21   Storm Frisk, MD    Physical Exam: Vitals:   12/17/21 2230 12/17/21 2332 12/18/21 0000 12/18/21 0007  BP: (!) 108/56 103/66 (!) 147/80   Pulse: 100 92 97   Resp: 15 16    Temp:    (!) 97.2 F (36.2 C)  TempSrc:    Oral  SpO2: 100% 100% 100%     Constitutional: NAD, calm  Eyes: PERTLA, lids and conjunctivae normal ENMT: Mucous membranes are moist. Posterior pharynx clear of any exudate or lesions.   Neck: supple, no masses  Respiratory: no wheezing, no crackles. No accessory muscle use.  Cardiovascular: S1 & S2 heard, regular rate and rhythm. No extremity edema. No significant JVD. Abdomen: No distension, no tenderness, soft. Bowel sounds active.  Musculoskeletal: no clubbing / cyanosis. Skeletal deformities.   Skin: no significant rashes, lesions, ulcers. Warm, dry, well-perfused. Neurologic: CN 2-12 grossly intact. Moving all extremities. Alert and oriented to person, place, and situation.  Psychiatric: Pleasant. Cooperative.    Labs and Imaging on Admission: I have personally reviewed following labs and imaging studies  CBC: Recent  Labs  Lab 12/17/21 1840  WBC 7.8  NEUTROABS 5.5  HGB 9.0*  HCT 30.2*  MCV 74.2*  PLT 467*   Basic Metabolic Panel: Recent Labs  Lab 12/17/21 1840  NA 139  K 4.4  CL 107  CO2 23  GLUCOSE 103*  BUN 25*  CREATININE 1.20*  CALCIUM 9.5  MG 1.9   GFR: CrCl cannot be calculated (Unknown ideal weight.). Liver Function Tests: Recent Labs  Lab 12/17/21 1840  AST 23  ALT 13  ALKPHOS 72  BILITOT 0.6  PROT 8.7*  ALBUMIN 3.8   Recent Labs  Lab 12/17/21 1840  LIPASE 37   No results for input(s): "AMMONIA" in the last 168 hours. Coagulation Profile: Recent Labs  Lab 12/17/21 1840  INR 1.0   Cardiac Enzymes: No results for input(s): "CKTOTAL", "CKMB", "CKMBINDEX", "TROPONINI" in the last 168 hours.  BNP (last 3 results) No results for input(s): "PROBNP" in the last 8760 hours. HbA1C: No results for input(s): "HGBA1C" in the last 72 hours. CBG: No results for input(s): "GLUCAP" in the last 168 hours. Lipid Profile: No results for input(s): "CHOL", "HDL", "LDLCALC", "TRIG", "CHOLHDL", "LDLDIRECT" in the last 72 hours. Thyroid Function Tests: No results for input(s): "TSH", "T4TOTAL", "FREET4", "T3FREE", "THYROIDAB" in the last 72 hours. Anemia Panel: No results for input(s): "VITAMINB12", "FOLATE", "FERRITIN", "TIBC", "IRON", "RETICCTPCT" in the last 72 hours. Urine analysis:    Component Value Date/Time   COLORURINE YELLOW 12/17/2021 1841   APPEARANCEUR TURBID (A) 12/17/2021 1841   LABSPEC 1.018 12/17/2021 1841   PHURINE 5.0 12/17/2021 1841   GLUCOSEU NEGATIVE 12/17/2021 1841   HGBUR MODERATE (A) 12/17/2021 1841   BILIRUBINUR NEGATIVE 12/17/2021 1841   KETONESUR NEGATIVE 12/17/2021 1841   PROTEINUR >=300 (A) 12/17/2021 1841   UROBILINOGEN 0.2 01/15/2007 2203   NITRITE NEGATIVE 12/17/2021 1841   LEUKOCYTESUR LARGE (A) 12/17/2021 1841   Sepsis Labs: @LABRCNTIP (procalcitonin:4,lacticidven:4) )No results found for this or any previous visit (from the past 240  hour(s)).   Radiological Exams on Admission: DG Chest Portable 1 View  Result Date: 12/17/2021 CLINICAL DATA:  Hypotension and left shoulder pain EXAM: PORTABLE CHEST 1 VIEW COMPARISON:  09/26/2020 FINDINGS: The heart size and mediastinal contours are within normal limits. Both lungs are clear. The visualized skeletal structures are unremarkable. IMPRESSION: No active disease. Electronically Signed   By: Deatra Robinson M.D.   On: 12/17/2021 19:04    EKG: Independently reviewed. Sinus rhythm.   Assessment/Plan   1. Hypotension  - Presents with lightheadedness and near-syncope and found to be hypotensive with BP as low as 85/45 in ED  - There is no fever or other SIRS criteria, no overt bleeding, and troponin and EKG are normal  - BP normalized with IVF and this was likely d/t hypovolemia and antihypertensive use  - Hold lisinopril, continue IVF hydration, and monitor closely overnight   2. AKI  - SCr is 1.20 on admission, up from 0.86 in June 2023 in setting of hypotension and ACE-inhibition   - She was given 2 liters LR in ED  - Continue IVF hydration, hold lisinopril, repeat chem panel in am   3. UTI  - Pt reports dysuria  - She is not septic on admission  - Urine culture was collected in ED and Rocephin started  - Continue Rocephin    4. Insulin-dependent DM  - A1c was 7.8% in June 2023  - Continue CBG checks and insulin    5. Chronic left ankle ulcer  - Does not appear infected  - Continue wound care     DVT prophylaxis: Lovenox  Code Status: Full  Level of Care: Level of care: Telemetry Family Communication: None present  Disposition Plan:  Patient is from: Shelter Anticipated d/c is to: Shelter   Anticipated d/c date is: 9/13 or 12/19/21  Patient currently: pending stable BP and renal function  Consults called: none Admission status: observation     Briscoe Deutscher, MD [facility]  12/18/2021, 12:46 AM

## 2021-12-17 NOTE — ED Triage Notes (Signed)
[facility] brings pt in for hypotension and left shoulder pain.staff states pt had a possible syncopal episode today. States left shoulder has been hurting for about 2 days, denies injury. Pt alert and oriented.

## 2021-12-17 NOTE — ED Notes (Signed)
Pt ambulatory to [facility] for UA, noted steady gait

## 2021-12-18 ENCOUNTER — Encounter (HOSPITAL_BASED_OUTPATIENT_CLINIC_OR_DEPARTMENT_OTHER): Payer: Medicaid Other | Admitting: General Surgery

## 2021-12-18 DIAGNOSIS — E861 Hypovolemia: Secondary | ICD-10-CM | POA: Diagnosis not present

## 2021-12-18 DIAGNOSIS — E11622 Type 2 diabetes mellitus with other skin ulcer: Secondary | ICD-10-CM | POA: Diagnosis not present

## 2021-12-18 DIAGNOSIS — I9589 Other hypotension: Secondary | ICD-10-CM | POA: Diagnosis not present

## 2021-12-18 LAB — BASIC METABOLIC PANEL
Anion gap: 6 (ref 5–15)
BUN: 20 mg/dL (ref 6–20)
CO2: 25 mmol/L (ref 22–32)
Calcium: 8.8 mg/dL — ABNORMAL LOW (ref 8.9–10.3)
Chloride: 105 mmol/L (ref 98–111)
Creatinine, Ser: 0.7 mg/dL (ref 0.44–1.00)
GFR, Estimated: >60 mL/min (ref >60)
Glucose, Bld: 108 mg/dL — ABNORMAL HIGH (ref 70–99)
Potassium: 3.8 mmol/L (ref 3.5–5.1)
Sodium: 136 mmol/L (ref 135–145)

## 2021-12-18 LAB — RETICULOCYTES
Immature Retic Fract: 13.4 % (ref 2.3–15.9)
RBC.: 3.82 MIL/uL — ABNORMAL LOW (ref 3.87–5.11)
Retic Count, Absolute: 33.6 10*3/uL (ref 19.0–186.0)
Retic Ct Pct: 0.9 % (ref 0.4–3.1)

## 2021-12-18 LAB — HIV ANTIBODY (ROUTINE TESTING W REFLEX): HIV Screen 4th Generation wRfx: NONREACTIVE

## 2021-12-18 LAB — CBC
HCT: 28.1 % — ABNORMAL LOW (ref 36.0–46.0)
Hemoglobin: 8.6 g/dL — ABNORMAL LOW (ref 12.0–15.0)
MCH: 22.2 pg — ABNORMAL LOW (ref 26.0–34.0)
MCHC: 30.6 g/dL (ref 30.0–36.0)
MCV: 72.6 fL — ABNORMAL LOW (ref 80.0–100.0)
Platelets: 409 10*3/uL — ABNORMAL HIGH (ref 150–400)
RBC: 3.87 MIL/uL (ref 3.87–5.11)
RDW: 17.8 % — ABNORMAL HIGH (ref 11.5–15.5)
WBC: 6.2 10*3/uL (ref 4.0–10.5)
nRBC: 0 % (ref 0.0–0.2)

## 2021-12-18 LAB — CBG MONITORING, ED
Glucose-Capillary: 191 mg/dL — ABNORMAL HIGH (ref 70–99)
Glucose-Capillary: 51 mg/dL — ABNORMAL LOW (ref 70–99)
Glucose-Capillary: 71 mg/dL (ref 70–99)

## 2021-12-18 MED ORDER — INSULIN ASPART 100 UNIT/ML IJ SOLN
0.0000 [IU] | Freq: Three times a day (TID) | INTRAMUSCULAR | Status: DC
  Filled 2021-12-18: qty 0.06

## 2021-12-18 MED ORDER — INSULIN GLARGINE-YFGN 100 UNIT/ML ~~LOC~~ SOLN
30.0000 [IU] | Freq: Every day | SUBCUTANEOUS | Status: DC
  Administered 2021-12-18: 30 [IU] via SUBCUTANEOUS
  Filled 2021-12-18 (×2): qty 0.3

## 2021-12-18 MED ORDER — INSULIN ASPART 100 UNIT/ML IJ SOLN
0.0000 [IU] | Freq: Every day | INTRAMUSCULAR | Status: DC
  Filled 2021-12-18: qty 0.05

## 2021-12-18 MED ORDER — CEPHALEXIN 500 MG PO CAPS: 500.0000 mg | ORAL_CAPSULE | Freq: Two times a day (BID) | ORAL | 0 refills | Status: AC

## 2021-12-18 NOTE — Progress Notes (Signed)
[name], [name] ([id number]) Visit Report for 12/16/2021 Arrival Information Details Patient Name: Date of Service: [name] 12/16/2021 2:30 PM Medical Record Number: [id number] Patient Account Number: [id number] Date of Birth/Sex: Treating RN: 05/30/1975 (46 y.o. [name], [name] Primary Care [name]: [name] Other Clinician: Referring Jahnyla Parrillo: Treating Cathern Tahir/Extender: [name] in Treatment: 0 Visit Information History Since Last Visit All ordered tests and consults were completed: Yes Patient Arrived: Ambulatory Added or deleted any medications: No Arrival Time: 14:36 Any new allergies or adverse reactions: No Accompanied By: self Had a fall or experienced change in No Transfer Assistance: None activities of daily living that may affect Patient Requires Transmission-Based Precautions: No risk of falls: Patient Has Alerts: Yes Signs or symptoms of abuse/neglect since last visito No Patient Alerts: ABI LLE 1.22 Hospitalized since last visit: No Implantable device outside of the clinic excluding No cellular tissue based products placed in the center since last visit: Has Dressing in Place as Prescribed: Yes Pain Present Now: No Electronic Signature(s) Signed: 12/18/2021 4:45:05 PM By: Blanche East RN Entered By: Blanche East on 12/16/2021 14:42:05 -------------------------------------------------------------------------------- Encounter Discharge Information Details Patient Name: Date of Service: [name] 12/16/2021 2:30 PM Medical Record Number: [id number] Patient Account Number: [id number] Date of Birth/Sex: Treating RN: 01/04/1976 (46 y.o. [name] Primary Care Krislyn Donnan: [name] Other Clinician: Referring Christipher Rieger: Treating Azaylea Maves/Extender: [name] in Treatment: 0 Encounter Discharge Information Items Post Procedure Vitals Discharge Condition:  Stable Temperature (F): 98.1 Ambulatory Status: Ambulatory Pulse (bpm): 96 Discharge Destination: Other (Note Required) Respiratory Rate (breaths/min): 16 Telephoned: No Blood Pressure (mmHg): 99/63 Orders Sent: No Transportation: Other Accompanied By: self Schedule Follow-up Appointment: Yes Clinical Summary of Care: Electronic Signature(s) Signed: 12/18/2021 4:45:05 PM By: Blanche East RN Entered By: Blanche East on 12/16/2021 15:13:38 -------------------------------------------------------------------------------- Lower Extremity Assessment Details Patient Name: Date of Service: [name] 12/16/2021 2:30 PM Medical Record Number: [id number] Patient Account Number: [id number] Date of Birth/Sex: Treating RN: 1976/02/04 (46 y.o. [name] Primary Care Laraya Pestka: [name] Other Clinician: Referring Romeka Scifres: Treating Julienne Vogler/Extender: [name] in Treatment: 0 Edema Assessment Assessed: [Left: No] [Right: No] E[Left: dema] [Right: :] Calf Left: Right: Point of Measurement: From Medial Instep 30.2 cm Ankle Left: Right: Point of Measurement: From Medial Instep 16 cm Vascular Assessment Pulses: Dorsalis Pedis Palpable: [Left:Yes] Electronic Signature(s) Signed: 12/18/2021 4:45:05 PM By: Blanche East RN Entered By: Blanche East on 12/16/2021 14:43:24 -------------------------------------------------------------------------------- Multi Wound Chart Details Patient Name: Date of Service: [name] 12/16/2021 2:30 PM Medical Record Number: [id number] Patient Account Number: [id number] Date of Birth/Sex: Treating RN: 1975-11-01 (46 y.o. [name] Primary Care Smriti Barkow: [name] Other Clinician: Referring Bensyn Bornemann: Treating Davelyn Gwinn/Extender: [name] in Treatment: 0 Vital Signs Height(in): 58 Capillary Blood Glucose(mg/dl): 141 Weight(lbs):  111 Pulse(bpm): 96 Body Mass Index(BMI): 23.2 Blood Pressure(mmHg): 99/63 Temperature(F): 98.1 Respiratory Rate(breaths/min): 16 Photos: [1:Left, Lateral Ankle] [N/A:N/A N/A] Wound Location: [1:Laceration] [N/A:N/A] Wounding Event: [1:Abrasion] [N/A:N/A] Primary Etiology: [1:Hypertension, Type II Diabetes] [N/A:N/A] Comorbid History: [1:10/24/2021] [N/A:N/A] Date Acquired: [1:0] [N/A:N/A] Weeks of Treatment: [1:Open] [N/A:N/A] Wound Status: [1:No] [N/A:N/A] Wound Recurrence: [1:0.8x1.3x0.3] [N/A:N/A] Measurements L x W x D (cm) [1:0.817] [N/A:N/A] A (cm) : rea [1:0.245] [N/A:N/A] Volume (cm) : [1:-23.80%] [N/A:N/A] % Reduction in A rea: [1:-85.60%] [N/A:N/A] % Reduction in Volume: [1:Full Thickness Without Exposed] [N/A:N/A] Classification: [1:Support Structures Medium] [N/A:N/A] Exudate Amount: [1:Serosanguineous] [N/A:N/A] Exudate Type: [  1:red, brown] [N/A:N/A] Exudate Color: [1:Flat and Intact] [N/A:N/A] Wound Margin: [1:Medium (34-66%)] [N/A:N/A] Granulation Amount: [1:Medium (34-66%)] [N/A:N/A] Necrotic Amount: [1:Fat Layer (Subcutaneous Tissue): Yes N/A] Exposed Structures: [1:Fascia: No Tendon: No Muscle: No Joint: No Bone: No Debridement - Excisional] [N/A:N/A] Debridement: Pre-procedure Verification/Time Out 14:52 [N/A:N/A] Taken: [1:Lidocaine 5% topical ointment] [N/A:N/A] Pain Control: [1:Subcutaneous, Slough] [N/A:N/A] Tissue Debrided: [1:Skin/Subcutaneous Tissue] [N/A:N/A] Level: [1:1.04] [N/A:N/A] Debridement A (sq cm): [1:rea Curette] [N/A:N/A] Instrument: [1:Minimum] [N/A:N/A] Bleeding: [1:Pressure] [N/A:N/A] Hemostasis A chieved: [1:0] [N/A:N/A] Procedural Pain: [1:0] [N/A:N/A] Post Procedural Pain: [1:Procedure was tolerated well] [N/A:N/A] Debridement Treatment Response: [1:0.8x1.3x0.3] [N/A:N/A] Post Debridement Measurements L x W x D (cm) [1:0.245] [N/A:N/A] Post Debridement Volume: (cm) [1:Debridement] [N/A:N/A] Treatment Notes Electronic  Signature(s) Signed: 12/16/2021 2:58:01 PM By: Fredirick Maudlin MD FACS Signed: 12/18/2021 4:45:05 PM By: Blanche East RN Entered By: Fredirick Maudlin on 12/16/2021 14:58:01 -------------------------------------------------------------------------------- Multi-Disciplinary Care Plan Details Patient Name: Date of Service: Tina Demark [facility] Church 12/16/2021 2:30 PM Medical Record Number: [id number] Patient Account Number: [id number] Date of Birth/Sex: Treating RN: Jul 04, 1975 (46 y.o. [name] Primary Care Malak Orantes: [name] Other Clinician: Referring Jazon Jipson: Treating Jackolyn Geron/Extender: [name] in Treatment: 0 Active Inactive Orientation to the Wound Care Program Nursing Diagnoses: Knowledge deficit related to the wound healing center program Goals: Patient/caregiver will verbalize understanding of the [place] Program Date Initiated: 12/11/2021 Target Resolution Date: 01/08/2022 Goal Status: Active Interventions: Provide education on orientation to the wound center Notes: Wound/Skin Impairment Nursing Diagnoses: Knowledge deficit related to ulceration/compromised skin integrity Goals: Ulcer/skin breakdown will have a volume reduction of 30% by week 4 Date Initiated: 12/11/2021 Target Resolution Date: 01/08/2022 Goal Status: Active Interventions: Assess patient/caregiver ability to obtain necessary supplies Assess ulceration(s) every visit Treatment Activities: Skin care regimen initiated : 12/11/2021 Notes: Electronic Signature(s) Signed: 12/18/2021 4:45:05 PM By: Blanche East RN Entered By: Blanche East on 12/16/2021 14:44:53 -------------------------------------------------------------------------------- Pain Assessment Details Patient Name: Date of Service: Tina Demark [facility] Church 12/16/2021 2:30 PM Medical Record Number: [id number] Patient Account Number: [id number] Date of Birth/Sex: Treating RN: 1975/10/11 (46  y.o. [name] Primary Care Olman Yono: [name] Other Clinician: Referring Abena Erdman: Treating Stamatia Masri/Extender: [name] in Treatment: 0 Active Problems Location of Pain Severity and Description of Pain Patient Has Paino No Site Locations Pain Management and Medication Current Pain Management: Electronic Signature(s) Signed: 12/18/2021 4:45:05 PM By: Blanche East RN Entered By: Blanche East on 12/16/2021 14:42:49 -------------------------------------------------------------------------------- Patient/Caregiver Education Details Patient Name: Date of Service: [name] 9/11/2023andnbsp2:30 PM Medical Record Number: [id number] Patient Account Number: [id number] Date of Birth/Gender: Treating RN: 22-Mar-1976 (46 y.o. [name] Primary Care Physician: [name] Other Clinician: Referring Physician: Treating Physician/Extender: [name] in Treatment: 0 Education Assessment Education Provided To: Patient Education Topics Provided Safety: Methods: Explain/Verbal Responses: Reinforcements needed, State content correctly Wound/Skin Impairment: Methods: Explain/Verbal Responses: Reinforcements needed, State content correctly Electronic Signature(s) Signed: 12/18/2021 4:45:05 PM By: Blanche East RN Entered By: Blanche East on 12/16/2021 14:45:32 -------------------------------------------------------------------------------- Wound Assessment Details Patient Name: Date of Service: Tina Demark [facility] Church 12/16/2021 2:30 PM Medical Record Number: [id number] Patient Account Number: [id number] Date of Birth/Sex: Treating RN: 11/18/1975 (46 y.o. [name] Primary Care Markail Diekman: [name] Other Clinician: Referring Jezel Basto: Treating Savannha Welle/Extender: [name] in Treatment: 0 Wound Status Wound Number: 1 Primary Etiology:  Abrasion Wound Location: Left, Lateral Ankle Wound Status: Open Wounding Event: Laceration Comorbid History: Hypertension,  Type II Diabetes Date Acquired: 10/24/2021 Weeks Of Treatment: 0 Clustered Wound: No Photos Wound Measurements Length: (cm) 0.8 Width: (cm) 1.3 Depth: (cm) 0.3 Area: (cm) 0.817 Volume: (cm) 0.245 % Reduction in Area: -23.8% % Reduction in Volume: -85.6% Tunneling: No Undermining: No Wound Description Classification: Full Thickness Without Exposed Support Structures Wound Margin: Flat and Intact Exudate Amount: Medium Exudate Type: Serosanguineous Exudate Color: red, brown Foul Odor After Cleansing: No Slough/Fibrino Yes Wound Bed Granulation Amount: Medium (34-66%) Exposed Structure Necrotic Amount: Medium (34-66%) Fascia Exposed: No Necrotic Quality: Adherent Slough Fat Layer (Subcutaneous Tissue) Exposed: Yes Tendon Exposed: No Muscle Exposed: No Joint Exposed: No Bone Exposed: No Electronic Signature(s) Signed: 12/18/2021 4:45:05 PM By: Blanche East RN Entered By: Blanche East on 12/16/2021 14:53:26 -------------------------------------------------------------------------------- Vitals Details Patient Name: Date of Service: [name] 12/16/2021 2:30 PM Medical Record Number: [id number] Patient Account Number: [id number] Date of Birth/Sex: Treating RN: [birth year] (46 y.o. [name], [place] Primary Care Kiaja Shorty: [name] Other Clinician: Referring Shae Hinnenkamp: Treating Shuntavia Yerby/Extender: [name] in Treatment: 0 Vital Signs Time Taken: 14:36 Temperature (F): 98.1 Height (in): 58 Pulse (bpm): 96 Weight (lbs): 111 Respiratory Rate (breaths/min): 16 Body Mass Index (BMI): 23.2 Blood Pressure (mmHg): 99/63 Capillary Blood Glucose (mg/dl): 141 Reference Range: 80 - 120 mg / dl Electronic Signature(s) Signed: 12/18/2021 4:45:05 PM By: Blanche East RN Entered By: Blanche East on  12/16/2021 14:42:45

## 2021-12-18 NOTE — Progress Notes (Signed)
[name], [name] ([id number]) Visit Report for 12/18/2021 SuperBill Details Patient Name: Date of Service: [name] 12/18/2021 Medical Record Number: [id number] Patient Account Number: [id number] Date of Birth/Sex: Treating RN: Jun 19, 1975 (46 y.o. [name] Primary Care Provider: [name] Other Clinician: Referring Provider: Treating Provider/Extender: [name] in Treatment: 1 Diagnosis Coding ICD-10 Codes Code Description [id number] Non-pressure chronic ulcer of left ankle with fat layer exposed E11.10 Type 2 diabetes mellitus with ketoacidosis without coma E11.622 Type 2 diabetes mellitus with other skin ulcer Facility Procedures CPT4 Code Description Modifier Quantity [id number] - WOUND CARE VISIT-LEV 3 EST PT 1 Electronic Signature(s) Signed: 12/18/2021 4:17:59 PM By: Fredirick Maudlin MD FACS Signed: 12/18/2021 4:20:00 PM By: Dellie Catholic RN Entered By: Dellie Catholic on 12/18/2021 16:15:49

## 2021-12-18 NOTE — Progress Notes (Signed)
[name], HOFSTRA ([id number]) Visit Report for 12/16/2021 Chief Complaint Document Details Patient Name: Date of Service: [name] 12/16/2021 2:30 PM Medical Record Number: [id number] Patient Account Number: [id number] Date of Birth/Sex: Treating RN: 10-25-1975 (46 y.o. [name] Primary Care Provider: [name] Other Clinician: Referring Provider: Treating Provider/Extender: [name] in Treatment: 0 Information Obtained from: Patient Chief Complaint Patient seen for complaints of Non-Healing Wound. Electronic Signature(s) Signed: 12/16/2021 2:58:06 PM By: Fredirick Maudlin MD FACS Entered By: Fredirick Maudlin on 12/16/2021 14:58:06 -------------------------------------------------------------------------------- Debridement Details Patient Name: Date of Service: [name] 12/16/2021 2:30 PM Medical Record Number: [id number] Patient Account Number: [id number] Date of Birth/Sex: Treating RN: 03/12/1976 (46 y.o. [name], [place] Primary Care Provider: [name] Other Clinician: Referring Provider: Treating Provider/Extender: [name] in Treatment: 0 Debridement Performed for Assessment: Wound #1 Left,Lateral Ankle Performed By: Physician Fredirick Maudlin, MD Debridement Type: Debridement Level of Consciousness (Pre-procedure): Awake and Alert Pre-procedure Verification/Time Out Yes - 14:52 Taken: Start Time: 14:53 Pain Control: Lidocaine 5% topical ointment T Area Debrided (L x W): otal 0.8 (cm) x 1.3 (cm) = 1.04 (cm) Tissue and other material debrided: Viable, Non-Viable, Slough, Subcutaneous, Slough Level: Skin/Subcutaneous Tissue Debridement Description: Excisional Instrument: Curette Bleeding: Minimum Hemostasis Achieved: Pressure Procedural Pain: 0 Post Procedural Pain: 0 Response to Treatment: Procedure was tolerated well Level of Consciousness (Post- Awake and  Alert procedure): Post Debridement Measurements of Total Wound Length: (cm) 0.8 Width: (cm) 1.3 Depth: (cm) 0.3 Volume: (cm) 0.245 Character of Wound/Ulcer Post Debridement: Improved Post Procedure Diagnosis Same as Pre-procedure Notes Scribing for Dr. Celine Ahr by Blanche East, RN Electronic Signature(s) Signed: 12/16/2021 3:19:09 PM By: Fredirick Maudlin MD FACS Signed: 12/18/2021 4:45:05 PM By: Blanche East RN Entered By: Blanche East on 12/16/2021 15:12:27 -------------------------------------------------------------------------------- HPI Details Patient Name: Date of Service: [name] UELINE 12/16/2021 2:30 PM Medical Record Number: [id number] Patient Account Number: [id number] Date of Birth/Sex: Treating RN: 08/24/1975 (46 y.o. [name] Primary Care Provider: [name] Other Clinician: Referring Provider: Treating Provider/Extender: [name] in Treatment: 0 History of Present Illness HPI Description: ADMISSION 12/11/2021 History is somewhat limited secondary to intellectual delay. Some of the history has been garnered from review of the electronic medical record. The patient reports that she was sitting on pavement with her left foot tucked underneath her. She says that she developed a wound on that site. She saw her PCP. They note from that clinic date (11/13/2021: Says that the wound was open but dried without any drainage. Left foot edema was described. A 7-day course of Keflex was prescribed. She is followed by podiatry for routine diabetic foot care. When she saw them on August 15, the provider referred her to a different podiatrist who debrided the wound. He applied Iodosorb and prescribed Silvadene daily dressing changes. The patient states that she has not been changing her dressing. She apparently resides in some sort of facility and she says there are nurses and doctors there. Specific details are difficult to sort out. She  denies any significant pain to the wound. On her left lateral malleolus, there is a circular wound that does seem consistent with the history provided. There is some slough buildup in the center of the wound. No fluctuance, odor, or purulent drainage. 12/16/2021: The wound is about the same size today but the surface is quite a bit cleaner. Electronic Signature(s) Signed: 12/16/2021 2:58:34 PM By: Fredirick Maudlin  MD FACS Entered By: Fredirick Maudlin on 12/16/2021 14:58:34 -------------------------------------------------------------------------------- Physical Exam Details Patient Name: Date of Service: [name] 12/16/2021 2:30 PM Medical Record Number: [id number] Patient Account Number: [id number] Date of Birth/Sex: Treating RN: 08-08-1975 (46 y.o. [name] Primary Care Provider: [name] Other Clinician: Referring Provider: Treating Provider/Extender: [name] in Treatment: 0 Constitutional Within normal range for this patient. . . . No acute distress.Marland Kitchen Respiratory Normal work of breathing on room air.. Notes 12/16/2021: The wound is about the same size today but the surface is quite a bit cleaner. Electronic Signature(s) Signed: 12/16/2021 3:00:59 PM By: Fredirick Maudlin MD FACS Entered By: Fredirick Maudlin on 12/16/2021 15:00:59 -------------------------------------------------------------------------------- Physician Orders Details Patient Name: Date of Service: [name] [facility] UELINE 12/16/2021 2:30 PM Medical Record Number: [id number] Patient Account Number: [id number] Date of Birth/Sex: Treating RN: [birth year] (46 y.o. [name], [place] Primary Care Provider: [name] Other Clinician: Referring Provider: Treating Provider/Extender: [name] in Treatment: 0 Verbal / Phone Orders: No Diagnosis Coding ICD-10 Coding Code Description [phone] Non-pressure chronic ulcer of left ankle  with fat layer exposed E11.10 Type 2 diabetes mellitus with ketoacidosis without coma E11.622 Type 2 diabetes mellitus with other skin ulcer Follow-up Appointments ppointment in 1 week. - Dr. Celine Ahr rm 4 Return A Monday 12/23/21 at 1:00 PM Nurse Visit: - RM 3 Wednesday 12/18/21 at 2:00PM Anesthetic Wound #1 Left,Lateral Ankle (In clinic) Topical Lidocaine 5% applied to wound bed - prior to debridement Bathing/ Shower/ Hygiene May shower and wash wound with soap and water. - on days when the dressing is changed Wound Treatment Wound #1 - Ankle Wound Laterality: Left, Lateral Cleanser: Soap and Water Every Other Day/15 Days Discharge Instructions: May shower and wash wound with dial antibacterial soap and water prior to dressing change. Peri-Wound Care: Sween Lotion (Moisturizing lotion) Every Other Day/15 Days Discharge Instructions: Apply moisturizing lotion as directed Prim Dressing: IODOFLEX 0.9% Cadexomer Iodine Pad 4x6 cm (Dispense As Written) Every Other Day/15 Days ary Discharge Instructions: Apply to wound bed as instructed Secondary Dressing: Bordered Gauze, 4x4 in (Dispense As Written) Every Other Day/15 Days Discharge Instructions: Apply over primary dressing as directed. Secondary Dressing: Optifoam Non-Adhesive Dressing, 4x4 in (Dispense As Written) Every Other Day/15 Days Discharge Instructions: Apply over primary dressing as directed. Secured With: Elastic Bandage 4 inch (ACE bandage) Every Other Day/15 Days Discharge Instructions: Secure with ACE bandage as directed. Secured With: [employer], 4.5x3.1 (in/yd) (Dispense As Written) Every Other Day/15 Days Discharge Instructions: Secure with Kerlix as directed. Patient Medications llergies: Documentation for allergies has not been submitted A Notifications Medication Indication Start End prior to debridement 12/16/2021 lidocaine DOSE topical 5 % cream - cream topical Electronic Signature(s) Signed: 12/16/2021  3:01:13 PM By: Fredirick Maudlin MD FACS Entered By: Fredirick Maudlin on 12/16/2021 15:01:13 Prescription 12/16/2021 -------------------------------------------------------------------------------- Dub Amis MD Patient Name: Provider: March 21, 1976 [id number] Date of Birth: NPI#Vicente Masson Sex: DEA #: [phone] Phone #: License #: [place] Patient Address: [address] [address] , [place], [place] [address] [phone] Allergies Medication Medication: Route: Strength: Form: lidocaine 5 % topical cream topical 5% cream Class: HEMORRHOIDALS, LOCAL RECTAL ANESTHETICS Dose: Frequency / Time: Indication: cream topical prior to debridement Number of Refills: Number of Units: 0 Generic Substitution: Start Date: End Date: One Time Use: Substitution Permitted 1/70/0174 No Note  to Pharmacy: Hand Signature: Date(s): Electronic Signature(s) Signed: 12/16/2021 3:02:16 PM By: Fredirick Maudlin MD FACS Entered By: Fredirick Maudlin on 12/16/2021 15:02:15 -------------------------------------------------------------------------------- Problem List Details Patient Name: Date of Service: [name] 12/16/2021 2:30 PM Medical Record Number: [id number] Patient Account Number: [id number] Date of Birth/Sex: Treating RN: 02-Jan-1976 (46 y.o. [name] Primary Care Provider: [name] Other Clinician: Referring Provider: Treating Provider/Extender: [name] in Treatment: 0 Active Problems ICD-10 Encounter Code Description Active Date MDM Diagnosis [phone] Non-pressure chronic ulcer of left ankle with fat layer exposed 12/11/2021 No Yes E11.10 Type 2 diabetes mellitus with ketoacidosis without coma 12/11/2021 No Yes E11.622 Type 2 diabetes mellitus with other skin ulcer 12/11/2021 No Yes Inactive  Problems Resolved Problems Electronic Signature(s) Signed: 12/16/2021 2:57:47 PM By: Fredirick Maudlin MD FACS Entered By: Fredirick Maudlin on 12/16/2021 14:57:47 -------------------------------------------------------------------------------- Progress Note Details Patient Name: Date of Service: [name] 12/16/2021 2:30 PM Medical Record Number: [id number] Patient Account Number: [id number] Date of Birth/Sex: Treating RN: 06/22/1975 (46 y.o. [name] Primary Care Provider: [name] Other Clinician: Referring Provider: Treating Provider/Extender: [name] in Treatment: 0 Subjective Chief Complaint Information obtained from Patient Patient seen for complaints of Non-Healing Wound. History of Present Illness (HPI) ADMISSION 12/11/2021 History is somewhat limited secondary to intellectual delay. Some of the history has been garnered from review of the electronic medical record. The patient reports that she was sitting on pavement with her left foot tucked underneath her. She says that she developed a wound on that site. She saw her PCP. They note from that clinic date (11/13/2021: Says that the wound was open but dried without any drainage. Left foot edema was described. A 7-day course of Keflex was prescribed. She is followed by podiatry for routine diabetic foot care. When she saw them on August 15, the provider referred her to a different podiatrist who debrided the wound. He applied Iodosorb and prescribed Silvadene daily dressing changes. The patient states that she has not been changing her dressing. She apparently resides in some sort of facility and she says there are nurses and doctors there. Specific details are difficult to sort out. She denies any significant pain to the wound. On her left lateral malleolus, there is a circular wound that does seem consistent with the history provided. There is some slough buildup in the center  of the wound. No fluctuance, odor, or purulent drainage. 12/16/2021: The wound is about the same size today but the surface is quite a bit cleaner. Patient History Unable to Obtain Patient History due to Altered Mental Status. Information obtained from Patient. Family History Diabetes - Mother,Father, Stroke - Siblings. Social History Never smoker, Marital Status - Single, Alcohol Use - Never, Drug Use - No History, Caffeine Use - Daily. Medical History Cardiovascular Patient has history of Hypertension Endocrine Patient has history of Type II Diabetes Psychiatric Denies history of Anorexia/bulimia, Confinement Anxiety Hospitalization/Surgery History - Cholecystectomy. - pelvic mass. - ventral hernia/ hernia repair. - Hand surgery (right hand). Medical A Surgical History Notes nd Gastrointestinal GERD Endocrine DKA Giant ovarian mass Objective Constitutional Within normal range for this patient. No acute distress.. Vitals Time Taken: 2:36 PM, Height: 58 in, Weight: 111 lbs, BMI: 23.2, Temperature: 98.1 F, Pulse: 96 bpm, Respiratory Rate: 16 breaths/min, Blood Pressure: 99/63 mmHg, Capillary Blood Glucose: 141 mg/dl. Respiratory Normal work of breathing on room air.. General Notes: 12/16/2021: The wound is about the same size  today but the surface is quite a bit cleaner. Integumentary (Hair, Skin) Wound #1 status is Open. Original cause of wound was Laceration. The date acquired was: 10/24/2021. The wound is located on the Left,Lateral Ankle. The wound measures 0.8cm length x 1.3cm width x 0.3cm depth; 0.817cm^2 area and 0.245cm^3 volume. There is Fat Layer (Subcutaneous Tissue) exposed. There is no tunneling or undermining noted. There is a medium amount of serosanguineous drainage noted. The wound margin is flat and intact. There is medium (34-66%) granulation within the wound bed. There is a medium (34-66%) amount of necrotic tissue within the wound bed including Adherent  Slough. Assessment Active Problems ICD-10 Non-pressure chronic ulcer of left ankle with fat layer exposed Type 2 diabetes mellitus with ketoacidosis without coma Type 2 diabetes mellitus with other skin ulcer Procedures Wound #1 Pre-procedure diagnosis of Wound #1 is an Abrasion located on the Left,Lateral Ankle . There was a Excisional Skin/Subcutaneous Tissue Debridement with a total area of 1.04 sq cm performed by Fredirick Maudlin, MD. With the following instrument(s): Curette to remove Viable and Non-Viable tissue/material. Material removed includes Subcutaneous Tissue and Slough and after achieving pain control using Lidocaine 5% topical ointment. No specimens were taken. A time out was conducted at 14:52, prior to the start of the procedure. A Minimum amount of bleeding was controlled with Pressure. The procedure was tolerated well with a pain level of 0 throughout and a pain level of 0 following the procedure. Post Debridement Measurements: 0.8cm length x 1.3cm width x 0.3cm depth; 0.245cm^3 volume. Character of Wound/Ulcer Post Debridement is improved. Post procedure Diagnosis Wound #1: Same as Pre-Procedure Plan Follow-up Appointments: Return Appointment in 1 week. - Dr. Celine Ahr rm 4 Monday 12/23/21 at 1:00 PM Nurse Visit: - RM 3 Wednesday 12/18/21 at 2:00PM Anesthetic: Wound #1 Left,Lateral Ankle: (In clinic) Topical Lidocaine 5% applied to wound bed - prior to debridement Bathing/ Shower/ Hygiene: May shower and wash wound with soap and water. - on days when the dressing is changed The following medication(s) was prescribed: lidocaine topical 5 % cream cream topical for prior to debridement was prescribed at facility WOUND #1: - Ankle Wound Laterality: Left, Lateral Cleanser: Soap and Water Every Other Day/15 Days Discharge Instructions: May shower and wash wound with dial antibacterial soap and water prior to dressing change. Peri-Wound Care: Sween Lotion (Moisturizing lotion)  Every Other Day/15 Days Discharge Instructions: Apply moisturizing lotion as directed Prim Dressing: IODOFLEX 0.9% Cadexomer Iodine Pad 4x6 cm (Dispense As Written) Every Other Day/15 Days ary Discharge Instructions: Apply to wound bed as instructed Secondary Dressing: Bordered Gauze, 4x4 in (Dispense As Written) Every Other Day/15 Days Discharge Instructions: Apply over primary dressing as directed. Secondary Dressing: Optifoam Non-Adhesive Dressing, 4x4 in (Dispense As Written) Every Other Day/15 Days Discharge Instructions: Apply over primary dressing as directed. Secured With: Elastic Bandage 4 inch (ACE bandage) Every Other Day/15 Days Discharge Instructions: Secure with ACE bandage as directed. Secured With: [employer], 4.5x3.1 (in/yd) (Dispense As Written) Every Other Day/15 Days Discharge Instructions: Secure with Kerlix as directed. 12/16/2021: The wound is about the same size today but the surface is quite a bit cleaner. I used a curette to debride slough and nonviable subcutaneous tissue from the wound. I am going to use Iodoflex for another week and then hopefully can change to a different dressing if the wound continues to clean up with the chemical debridement. Follow-up in 1 week. Electronic Signature(s) Signed: 12/16/2021 3:01:55 PM By: Fredirick Maudlin MD FACS Entered  By: Fredirick Maudlin on 12/16/2021 15:01:55 -------------------------------------------------------------------------------- HxROS Details Patient Name: Date of Service: [name] 12/16/2021 2:30 PM Medical Record Number: [id number] Patient Account Number: [id number] Date of Birth/Sex: Treating RN: Dec 02, 1975 (46 y.o. [name] Primary Care Provider: [name] Other Clinician: Referring Provider: Treating Provider/Extender: [name] in Treatment: 0 Unable to Obtain Patient History due to Altered Mental Status Information Obtained  From Patient Cardiovascular Medical History: Positive for: Hypertension Gastrointestinal Medical History: Past Medical History Notes: GERD Endocrine Medical History: Positive for: Type II Diabetes Past Medical History Notes: DKA Giant ovarian mass Treated with: Insulin, Oral agents Blood sugar tested every day: Yes Tested : 2 Psychiatric Medical History: Negative for: Anorexia/bulimia; Confinement Anxiety Immunizations Pneumococcal Vaccine: Received Pneumococcal Vaccination: Yes Received Pneumococcal Vaccination On or After 60th Birthday: No Implantable Devices No devices added Hospitalization / Surgery History Type of Hospitalization/Surgery Cholecystectomy pelvic mass ventral hernia/ hernia repair Hand surgery (right hand) Family and Social History Diabetes: Yes - Mother,Father; Stroke: Yes - Siblings; Never smoker; Marital Status - Single; Alcohol Use: Never; Drug Use: No History; Caffeine Use: Daily; Financial Concerns: No; Food, Clothing or Shelter Needs: No; Transportation Concerns: No Engineer, maintenance) Signed: 12/16/2021 3:19:09 PM By: Fredirick Maudlin MD FACS Signed: 12/18/2021 4:45:05 PM By: Blanche East RN Entered By: Fredirick Maudlin on 12/16/2021 15:00:20 -------------------------------------------------------------------------------- SuperBill Details Patient Name: Date of Service: [name] Katherine Basset 12/16/2021 Medical Record Number: [id number] Patient Account Number: [id number] Date of Birth/Sex: Treating RN: July 18, 1975 (46 y.o. [name], [place] Primary Care Provider: [name] Other Clinician: Referring Provider: Treating Provider/Extender: [name] in Treatment: 0 Diagnosis Coding ICD-10 Codes Code Description [phone] Non-pressure chronic ulcer of left ankle with fat layer exposed E11.10 Type 2 diabetes mellitus with ketoacidosis without coma E11.622 Type 2 diabetes mellitus with other skin  ulcer Facility Procedures CPT4 Code: [id number] Description: 84696 - DEB SUBQ TISSUE 20 SQ CM/< ICD-10 Diagnosis Description L97.322 Non-pressure chronic ulcer of left ankle with fat layer exposed Modifier: Quantity: 1 Physician Procedures : CPT4 Code Description Modifier [id number] 32440 - WC PHYS LEVEL 3 - EST PT 25 ICD-10 Diagnosis Description L97.322 Non-pressure chronic ulcer of left ankle with fat layer exposed E11.10 Type 2 diabetes mellitus with ketoacidosis without coma E11.622 Type  2 diabetes mellitus with other skin ulcer Quantity: 1 : [id number] 11042 - WC PHYS SUBQ TISS 20 SQ CM ICD-10 Diagnosis Description L97.322 Non-pressure chronic ulcer of left ankle with fat layer exposed Quantity: 1 Electronic Signature(s) Signed: 12/16/2021 3:02:11 PM By: Fredirick Maudlin MD FACS Entered By: Fredirick Maudlin on 12/16/2021 15:02:11

## 2021-12-18 NOTE — Progress Notes (Signed)
[name], [name] ([id number]) Visit Report for 12/18/2021 Arrival Information Details Patient Name: Date of Service: [name] 12/18/2021 2:00 PM Medical Record Number: [id number] Patient Account Number: [id number] Date of Birth/Sex: Treating Church: 12/11/1975 (46 y.o. [name], [place] [facility] [name]: [name] Other Clinician: Referring [name]: Treating [name]/Extender: [name] in Treatment: 1 Visit Information History Since Last Visit All ordered tests and consults were completed: Yes Patient Arrived: Ambulatory Added or deleted any medications: No Arrival Time: 14:17 Any new allergies or adverse reactions: No Accompanied By: self Had a fall or experienced change in No Transfer Assistance: None activities of daily living that may affect Patient Requires Transmission-Based Precautions: No risk of falls: Patient Has Alerts: Yes Signs or symptoms of abuse/neglect since last visito No Patient Alerts: ABI LLE 1.22 Hospitalized since last visit: No Implantable device outside of the clinic excluding No cellular tissue based products placed in the center since last visit: Has Dressing in Place as Prescribed: No Pain Present Now: No Electronic Signature(s) Signed: 12/18/2021 4:45:05 PM By: [name] Entered By: [name] on 12/18/2021 14:18:00 -------------------------------------------------------------------------------- Clinic Level of Care Assessment Details Patient Name: Date of Service: [name] 12/18/2021 2:00 PM Medical Record Number: [id number] Patient Account Number: [id number] Date of Birth/Sex: Treating Church: 1975/10/23 (46 y.o. [name] Primary Care [name]: [name] Other Clinician: Referring [name]: Treating [name]/Extender: [name] in Treatment: 1 Clinic Level of Care Assessment Items TOOL 4 Quantity Score X- 1 0 Use when only an  EandM is performed on FOLLOW-UP visit ASSESSMENTS - Nursing Assessment / Reassessment X- 1 10 Reassessment of Co-morbidities (includes updates in patient status) X- 1 5 Reassessment of Adherence to Treatment Plan ASSESSMENTS - Wound and Skin A ssessment / Reassessment X - Simple Wound Assessment / Reassessment - one wound 1 5 '[]'$  - 0 Complex Wound Assessment / Reassessment - multiple wounds '[]'$  - 0 Dermatologic / Skin Assessment (not related to wound area) ASSESSMENTS - Focused Assessment '[]'$  - 0 Circumferential Edema Measurements - multi extremities '[]'$  - 0 Nutritional Assessment / Counseling / Intervention '[]'$  - 0 Lower Extremity Assessment (monofilament, tuning fork, pulses) '[]'$  - 0 Peripheral Arterial Disease Assessment (using hand held doppler) ASSESSMENTS - Ostomy and/or Continence Assessment and Care '[]'$  - 0 Incontinence Assessment and Management '[]'$  - 0 Ostomy Care Assessment and Management (repouching, etc.) PROCESS - Coordination of Care X - Simple Patient / Family Education for ongoing care 1 15 '[]'$  - 0 Complex (extensive) Patient / Family Education for ongoing care X- 1 10 Staff obtains Programmer, systems, Records, T Results / Process Orders est X- 1 10 Staff telephones HHA, Nursing Homes / Clarify orders / etc '[]'$  - 0 Routine Transfer to another Facility (non-emergent condition) '[]'$  - 0 Routine Hospital Admission (non-emergent condition) '[]'$  - 0 New Admissions / Biomedical engineer / Ordering NPWT Apligraf, etc. , '[]'$  - 0 Emergency Hospital Admission (emergent condition) X- 1 10 Simple Discharge Coordination '[]'$  - 0 Complex (extensive) Discharge Coordination PROCESS - Special Needs '[]'$  - 0 Pediatric / Minor Patient Management '[]'$  - 0 Isolation Patient Management '[]'$  - 0 Hearing / Language / Visual special needs '[]'$  - 0 Assessment of Community assistance (transportation, D/C planning, etc.) '[]'$  - 0 Additional assistance / Altered mentation '[]'$  - 0 Support Surface(s)  Assessment (bed, cushion, seat, etc.) INTERVENTIONS - Wound Cleansing / Measurement X - Simple Wound Cleansing - one wound 1 5 '[]'$  - 0 Complex Wound  Cleansing - multiple wounds '[]'$  - 0 Wound Imaging (photographs - any number of wounds) '[]'$  - 0 Wound Tracing (instead of photographs) '[]'$  - 0 Simple Wound Measurement - one wound '[]'$  - 0 Complex Wound Measurement - multiple wounds INTERVENTIONS - Wound Dressings X - Small Wound Dressing one or multiple wounds 1 10 '[]'$  - 0 Medium Wound Dressing one or multiple wounds '[]'$  - 0 Large Wound Dressing one or multiple wounds '[]'$  - 0 Application of Medications - topical '[]'$  - 0 Application of Medications - injection INTERVENTIONS - Miscellaneous '[]'$  - 0 External ear exam '[]'$  - 0 Specimen Collection (cultures, biopsies, blood, body fluids, etc.) '[]'$  - 0 Specimen(s) / Culture(s) sent or taken to Lab for analysis '[]'$  - 0 Patient Transfer (multiple staff / Civil Service fast streamer / Similar devices) '[]'$  - 0 Simple Staple / Suture removal (25 or less) '[]'$  - 0 Complex Staple / Suture removal (26 or more) '[]'$  - 0 Hypo / Hyperglycemic Management (close monitor of Blood Glucose) '[]'$  - 0 Ankle / Brachial Index (ABI) - do not check if billed separately X- 1 5 Vital Signs Has the patient been seen at the hospital within the last three years: Yes Total Score: 85 Level Of Care: New/Established - Level 3 Electronic Signature(s) Signed: 12/18/2021 3:00:01 PM By: [name] Entered By: [name] on 12/18/2021 14:57:12 -------------------------------------------------------------------------------- Encounter Discharge Information Details Patient Name: Date of Service: [name] 12/18/2021 2:00 PM Medical Record Number: [id number] Patient Account Number: [id number] Date of Birth/Sex: Treating Church: Mar 07, 1976 (46 y.o. [name] Primary Care [name]: [name] Other Clinician: Referring Janssen Zee: Treating Markeria Goetsch/Extender: [name] in Treatment: 1 Encounter Discharge Information Items Discharge Condition: Stable Ambulatory Status: Ambulatory Discharge Destination: Home Transportation: Private Auto Accompanied By: self Schedule Follow-up Appointment: Yes Clinical Summary of Care: Electronic Signature(s) Signed: 12/18/2021 4:45:05 PM By: [name] Entered By: [name] on 12/18/2021 14:31:02 -------------------------------------------------------------------------------- Patient/Caregiver Education Details Patient Name: Date of Service: [name] 9/13/2023andnbsp2:00 PM Medical Record Number: [id number] Patient Account Number: [id number] Date of Birth/Gender: Treating Church: 07-09-1975 (46 y.o. [name] Primary Care Physician: [name] Other Clinician: Referring Physician: Treating Physician/Extender: [name] in Treatment: 1 Education Assessment Education Provided To: Patient Education Topics Provided Wound/Skin Impairment: Methods: Explain/Verbal Responses: Reinforcements needed, State content correctly Electronic Signature(s) Signed: 12/18/2021 4:45:05 PM By: [name] Entered By: [name] on 12/18/2021 14:30:48 -------------------------------------------------------------------------------- Wound Assessment Details Patient Name: Date of Service: [name] 12/18/2021 2:00 PM Medical Record Number: [id number] Patient Account Number: [id number] Date of Birth/Sex: Treating Church: 05-18-1975 (46 y.o. [name], [name] Primary Care Damany Eastman: [name] Other Clinician: Referring Amro Winebarger: Treating Emanuel Campos/Extender: [name] in Treatment: 1 Wound Status Wound Number: 1 Primary Etiology: Abrasion Wound Location: Left, Lateral Ankle Wound Status: Open Wounding Event: Laceration Comorbid History: Hypertension, Type II Diabetes Date Acquired:  10/24/2021 Weeks Of Treatment: 1 Clustered Wound: No Wound Measurements Length: (cm) 0.8 Width: (cm) 1.3 Depth: (cm) 0.3 Area: (cm) 0.817 Volume: (cm) 0.245 % Reduction in Area: -23.8% % Reduction in Volume: -85.6% Epithelialization: Medium (34-66%) Tunneling: No Undermining: No Wound Description Classification: Full Thickness Without Exposed Support Structures Wound Margin: Flat and Intact Exudate Amount: Medium Exudate Type: Serosanguineous Exudate Color: red, Church Foul Odor After Cleansing: No Slough/Fibrino Yes Wound Bed Granulation Amount: Medium (34-66%) Exposed Structure Granulation Quality: Pink Fascia Exposed: No Necrotic Amount: Medium (34-66%) Fat Layer (  Subcutaneous Tissue) Exposed: Yes Necrotic Quality: Adherent Slough Tendon Exposed: No Muscle Exposed: No Joint Exposed: No Bone Exposed: No Treatment Notes Wound #1 (Ankle) Wound Laterality: Left, Lateral Cleanser Soap and Water Discharge Instruction: May shower and wash wound with dial antibacterial soap and water prior to dressing change. Peri-Wound Care Sween Lotion (Moisturizing lotion) Discharge Instruction: Apply moisturizing lotion as directed Topical Primary Dressing IODOFLEX 0.9% Cadexomer Iodine Pad 4x6 cm Discharge Instruction: Apply to wound bed as instructed Secondary Dressing Bordered Gauze, 4x4 in Discharge Instruction: Apply over primary dressing as directed. Optifoam Non-Adhesive Dressing, 4x4 in Discharge Instruction: Apply over primary dressing as directed. Secured With Elastic Bandage 4 inch (ACE bandage) Discharge Instruction: Secure with ACE bandage as directed. Kerlix Roll Sterile, 4.5x3.1 (in/yd) Discharge Instruction: Secure with Kerlix as directed. Compression Wrap Compression Stockings Add-Ons Electronic Signature(s) Signed: 12/18/2021 4:45:05 PM By: [name] Entered By: [name] on 12/18/2021  14:19:26 -------------------------------------------------------------------------------- Vitals Details Patient Name: Date of Service: [name] 12/18/2021 2:00 PM Medical Record Number: [id number] Patient Account Number: [id number] Date of Birth/Sex: Treating Church: 08-Sep-1975 (46 y.o. [name], [place] Primary [facility] Michaela Broski: [name] Other Clinician: Referring Andras Grunewald: Treating Ilianna Bown/Extender: [name] in Treatment: 1 Vital Signs Time Taken: 14:18 Temperature (F): 98.1 Height (in): 58 Pulse (bpm): 120 Weight (lbs): 111 Respiratory Rate (breaths/min): 18 Body Mass Index (BMI): 23.2 Blood Pressure (mmHg): 123/71 Reference Range: 80 - 120 mg / dl Electronic Signature(s) Signed: 12/18/2021 4:45:05 PM By: [name] Entered By: [name] on 12/18/2021 14:18:19

## 2021-12-18 NOTE — ED Notes (Signed)
Cbg 51, pt denies symptoms. Orange juice given to pt, will re-check cbg.

## 2021-12-18 NOTE — Discharge Instructions (Signed)
[name],  You were in the hospital because of dizziness. This was because of low blood pressure. You were found to have dehydration when you were in the hospital and were treated with IV fluids. I recommend discontinuing your lisinopril until you can see your primary care physician. While you were here, you also were found to have low blood sugar (hypoglycemia). I recommend resume your previous recommended regimen of insulin 30 units in the morning and 20 units at night to minimized hypoglycemia; please also see your primary care physician about this as well.

## 2021-12-18 NOTE — Discharge Summary (Signed)
Physician Discharge Summary   Patient: [name] MRN: [id number] DOB: 1975/09/13  Admit date:     12/17/2021  Discharge date: 12/18/21  Discharge Physician: Cordelia Poche, MD   PCP: [name], NP   Recommendations at discharge:  Discontinue lisinopril until follow-up with PCP Follow-up urine culture  Discharge Diagnoses: Principal Problem:   Hypotension due to hypovolemia Active Problems:   Acute lower UTI   AKI (acute kidney injury) ([place])   Insulin-requiring or dependent type II diabetes mellitus ([place])   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[place])  Resolved Problems:   * No resolved hospital problems. *  Hospital Course: [name] is a 46 y.o. female with a history of DiGeorge syndrome, diabetes mellitus, hypertension, chronic ankle ulcer. Patient presented secondary to lightheadedness and was found to have evidence of hypotension, AKI and likely UTI. IV fluids initiated with improvement of blood pressure and renal function. Empiric ceftriaxone initiated and patient discharged on Keflex. Recommendation to discontinue lisinopril on discharge.  Assessment and Plan:  Hypotension Secondary to hypovolemia/dehydration and antihypertensive usage. Lisinopril held and patient given IV fluids with improvement of hypotension. Lisinopril discontinued on discharge.  AKI Baseline creatinine of about 0.9. Creatinine elevated at 1.2 on admission. Patient received IV fluids with improved creatinine of 0.7 on day of discharge.  UTI Associated symptom of dysuria. Urinalysis suggests UTI. Urine culture obtained on admission and empiric Ceftriaxone initiated. Discharge on Keflex.  Insulin dependent diabetes mellitus Uncontrolled with hyper- and hypoglycemia. Patient states she is taking 40 units of insulin glargine at night. She reports hypoglycemia episodes at home. Patient received insulin glargine 40 units this admission with resultant fasting CBG of 51 without symptoms.  Recommendation to return to prior prescription of insuline glargine 30 units in AM and 20 units at night.   Consultants: None Procedures performed: None  Disposition: Home Diet recommendation: Carb modified diet   DISCHARGE MEDICATION: Allergies as of 12/18/2021   No Known Allergies      Medication List     STOP taking these medications    lisinopril 10 MG tablet Commonly known as: ZESTRIL       TAKE these medications    Accu-Chek Guide test strip Generic drug: glucose blood Use 3 (three) times daily as instructed   Accu-Chek Guide w/Device Kit Use to check blood sugar 3 (three) times daily.   Accu-Chek Softclix Lancet Dev Kit Use to check blood sugar twice a day   Accu-Chek Softclix Lancets lancets Use 3 (three) times daily as directed   Acetaminophen 500 MG capsule Take 500-1,000 mg by mouth every 6 (six) hours as needed for pain.   Aspirin Low Dose 81 MG tablet Generic drug: aspirin EC Take 1 tablet (81 mg total) by mouth daily.   cephALEXin 500 MG capsule Commonly known as: KEFLEX Take 1 capsule (500 mg total) by mouth 2 (two) times daily for 4 days.   Easy Comfort Pen Needles 31G X 5 MM Misc Generic drug: Insulin Pen Needle USE TWICE A DAY WITH INSULIN PEN   fluticasone 50 MCG/ACT nasal spray Commonly known as: FLONASE Place 1 spray into both nostrils daily. What changed:  when to take this reasons to take this   Lantus 100 UNIT/ML injection Generic drug: insulin glargine Inject 0.3 mLs (30 Units total) into the skin in the morning AND 0.2 mLs (20 Units total) every evening. What changed: See the new instructions.   metFORMIN 500 MG tablet Commonly known as: GLUCOPHAGE Take 1 tablet (500 mg total) by  mouth 2 (two) times daily with a meal.   omeprazole 20 MG capsule Commonly known as: PRILOSEC Take 1 capsule (20 mg total) by mouth daily. What changed: when to take this   ondansetron 4 MG tablet Commonly known as: ZOFRAN Take 1 tablet  (4 mg total) by mouth every 6 (six) hours.   pravastatin 20 MG tablet Commonly known as: PRAVACHOL Take by mouth.   SSD 1 % cream Generic drug: silver sulfADIAZINE Apply 1 application topically to wound on both ankles once daily.   UltiCare Insulin Syringe 30G X 5/16" 0.5 ML Misc Generic drug: Insulin Syringe-Needle U-100 Use to administer insulin 2 (two) times daily.   INSULIN SYRINGE .5CC/31GX5/16" 31G X 5/16" 0.5 ML Misc Inject 1 Syringe into the skin daily.        Follow-up Information     [name], NP. Schedule an appointment as soon as possible for a visit in 1 week(s).   Specialty: Nurse Practitioner Why: For hospital follow-up Contact information: [address] [place] [address] [phone]                Discharge Exam: BP 107/74   Pulse 89   Temp 98.3 F (36.8 C) (Oral)   Resp 18   SpO2 97%   General exam: Appears calm and comfortable Respiratory system: Clear to auscultation. Respiratory effort normal. Cardiovascular system: S1 & S2 heard, RRR. Systolic murmur. Gastrointestinal system: Abdomen is nondistended, soft and nontender. Normal bowel sounds heard. Central nervous system: Alert and oriented. No focal neurological deficits. Skin: No cyanosis. No rashes Psychiatry: Judgement and insight appear normal. Mood & affect appropriate.   Condition at discharge: stable  The results of significant diagnostics from this hospitalization (including imaging, microbiology, ancillary and laboratory) are listed below for reference.   Imaging Studies: DG Chest Portable 1 View  Result Date: 12/17/2021 CLINICAL DATA:  Hypotension and left shoulder pain EXAM: PORTABLE CHEST 1 VIEW COMPARISON:  09/26/2020 FINDINGS: The heart size and mediastinal contours are within normal limits. Both lungs are clear. The visualized skeletal structures are unremarkable. IMPRESSION: No active disease. Electronically Signed   By: Ulyses Jarred M.D.   On: 12/17/2021  19:04    Microbiology: Results for orders placed or performed during the hospital encounter of 09/05/21  Urine Culture     Status: Abnormal   Collection Time: 09/05/21  9:13 AM   Specimen: Urine, Clean Catch  Result Value Ref Range Status   Specimen Description URINE, CLEAN CATCH  Final   Special Requests   Final    NONE Performed at [facility], [place] [address], [place],  [address]    Culture (A)  Final    >=100,000 COLONIES/mL MULTIPLE SPECIES PRESENT, SUGGEST RECOLLECTION   Report Status 09/06/2021 FINAL  Final    Labs: CBC: Recent Labs  Lab 12/17/21 1840 12/18/21 0508  WBC 7.8 6.2  NEUTROABS 5.5  --   HGB 9.0* 8.6*  HCT 30.2* 28.1*  MCV 74.2* 72.6*  PLT 467* 638*   Basic Metabolic Panel: Recent Labs  Lab 12/17/21 1840 12/18/21 0508  NA 139 136  K 4.4 3.8  CL 107 105  CO2 23 25  GLUCOSE 103* 108*  BUN 25* 20  CREATININE 1.20* 0.70  CALCIUM 9.5 8.8*  MG 1.9  --    Liver Function Tests: Recent Labs  Lab 12/17/21 1840  AST 23  ALT 13  ALKPHOS 72  BILITOT 0.6  PROT 8.7*  ALBUMIN 3.8  CBG: Recent Labs  Lab 12/18/21 0055 12/18/21 0744 12/18/21 0812  GLUCAP 191* 51* 71    Discharge time spent: 35 minutes.  Signed: Cordelia Poche, MD [facility] 12/18/2021

## 2021-12-18 NOTE — ED Notes (Signed)
Pt appears to be sleeping, observe even RR and unlabored, NAD noted, side rails up x2 for safety, plan of care ongoing, call light within reach, no further concerns as of present.   

## 2021-12-18 NOTE — ED Notes (Signed)
Pt up to bedside commode, no assist needed, will obtain morning labs once pt has finish

## 2021-12-19 LAB — URINE CULTURE

## 2021-12-23 ENCOUNTER — Encounter (HOSPITAL_BASED_OUTPATIENT_CLINIC_OR_DEPARTMENT_OTHER): Payer: Medicaid Other | Admitting: General Surgery

## 2021-12-23 DIAGNOSIS — E11622 Type 2 diabetes mellitus with other skin ulcer: Secondary | ICD-10-CM | POA: Diagnosis not present

## 2021-12-23 NOTE — Progress Notes (Signed)
ANNIYAH, MOOD ([id number]) Visit Report for 12/23/2021 Arrival Information Details Patient Name: Date of Service: [name] 12/23/2021 1:00 PM Medical Record Number: [id number] Patient Account Number: [id number] Date of Birth/Sex: Treating RN: 04/05/1976 (46 y.o. Iver Nestle, [place] Primary Care Yanet Balliet: [name] Other Clinician: Referring Anapaula Severt: Treating Sven Pinheiro/Extender: [name] in Treatment: 1 Visit Information History Since Last Visit All ordered tests and consults were completed: Yes Patient Arrived: Ambulatory Added or deleted any medications: No Arrival Time: 13:02 Any new allergies or adverse reactions: No Accompanied By: self Had a fall or experienced change in No Transfer Assistance: None activities of daily living that may affect Patient Identification Verified: Yes risk of falls: Secondary Verification Process Completed: Yes Signs or symptoms of abuse/neglect since last visito No Patient Requires Transmission-Based Precautions: No Hospitalized since last visit: No Patient Has Alerts: Yes Implantable device outside of the clinic excluding No Patient Alerts: ABI LLE 1.22 cellular tissue based products placed in the center since last visit: Has Dressing in Place as Prescribed: Yes Pain Present Now: No Electronic Signature(s) Signed: 12/23/2021 5:27:19 PM By: Blanche East RN Entered By: Blanche East on 12/23/2021 13:02:45 -------------------------------------------------------------------------------- Encounter Discharge Information Details Patient Name: Date of Service: [name] UELINE 12/23/2021 1:00 PM Medical Record Number: [id number] Patient Account Number: [id number] Date of Birth/Sex: Treating RN: 1976-01-29 (46 y.o. [name] Primary Care Legend Tumminello: [name] Other Clinician: Referring Shaquelle Hernon: Treating Jasten Guyette/Extender: [name] in Treatment:  1 Encounter Discharge Information Items Post Procedure Vitals Discharge Condition: Stable Temperature (F): 98.7 Ambulatory Status: Ambulatory Pulse (bpm): 118 Discharge Destination: Home Respiratory Rate (breaths/min): 16 Transportation: Other Blood Pressure (mmHg): 107/67 Accompanied By: self Schedule Follow-up Appointment: Yes Clinical Summary of Care: Electronic Signature(s) Signed: 12/23/2021 5:27:19 PM By: Blanche East RN Entered By: Blanche East on 12/23/2021 13:39:21 -------------------------------------------------------------------------------- Lower Extremity Assessment Details Patient Name: Date of Service: [name] [facility] UELINE 12/23/2021 1:00 PM Medical Record Number: [id number] Patient Account Number: [id number] Date of Birth/Sex: Treating RN: [birth year] (46 y.o. [name] Primary Care Shiraz Bastyr: [name] Other Clinician: Referring Karinna Beadles: Treating Ithzel Fedorchak/Extender: [name] in Treatment: 1 Edema Assessment Assessed: [Left: No] [Right: No] E[Left: dema] [Right: :] Calf Left: Right: Point of Measurement: From Medial Instep 30.5 cm Ankle Left: Right: Point of Measurement: From Medial Instep 16 cm Vascular Assessment Pulses: Dorsalis Pedis Palpable: [Left:Yes] Electronic Signature(s) Signed: 12/23/2021 5:27:19 PM By: Blanche East RN Entered By: Blanche East on 12/23/2021 13:08:36 -------------------------------------------------------------------------------- Multi Wound Chart Details Patient Name: Date of Service: [name] UELINE 12/23/2021 1:00 PM Medical Record Number: [id number] Patient Account Number: [id number] Date of Birth/Sex: Treating RN: 02-28-1976 (46 y.o. [name] Primary Care Joniya Boberg: [name] Other Clinician: Referring Makenize Messman: Treating Jacier Gladu/Extender: [name] in Treatment: 1 Vital Signs Height(in): 58 Pulse(bpm):  118 Weight(lbs): 111 Blood Pressure(mmHg): 107/67 Body Mass Index(BMI): 23.2 Temperature(F): 98.7 Respiratory Rate(breaths/min): 16 Photos: [N/A:N/A] Left, Lateral Ankle N/A N/A Wound Location: Laceration N/A N/A Wounding Event: Abrasion N/A N/A Primary Etiology: Hypertension, Type II Diabetes N/A N/A Comorbid History: 10/24/2021 N/A N/A Date Acquired: 1 N/A N/A Weeks of Treatment: Open N/A N/A Wound Status: No N/A N/A Wound Recurrence: 0.8x0.6x0.3 N/A N/A Measurements L x W x D (cm) 0.377 N/A N/A A (cm) : rea 0.113 N/A N/A Volume (cm) : 42.90% N/A N/A % Reduction in A rea: 14.40% N/A N/A % Reduction in Volume: 6 Starting Position 1 (o'clock):  9 Ending Position 1 (o'clock): 0.5 Maximum Distance 1 (cm): Yes N/A N/A Undermining: Full Thickness Without Exposed N/A N/A Classification: Support Structures Medium N/A N/A Exudate A mount: Serosanguineous N/A N/A Exudate Type: red, brown N/A N/A Exudate Color: Flat and Intact N/A N/A Wound Margin: Medium (34-66%) N/A N/A Granulation A mount: Pink N/A N/A Granulation Quality: Medium (34-66%) N/A N/A Necrotic A mount: Fat Layer (Subcutaneous Tissue): Yes N/A N/A Exposed Structures: Fascia: No Tendon: No Muscle: No Joint: No Bone: No Medium (34-66%) N/A N/A Epithelialization: Debridement - Excisional N/A N/A Debridement: Pre-procedure Verification/Time Out 13:22 N/A N/A Taken: Necrotic/Eschar, Callus, N/A N/A Tissue Debrided: Subcutaneous, Slough Skin/Subcutaneous Tissue N/A N/A Level: 0.48 N/A N/A Debridement A (sq cm): rea Curette N/A N/A Instrument: Minimum N/A N/A Bleeding: Pressure N/A N/A Hemostasis Achieved: 0 N/A N/A Procedural Pain: 0 N/A N/A Post Procedural Pain: Debridement Treatment Response: Procedure was tolerated well N/A N/A Post Debridement Measurements L x 0.8x0.6x0.3 N/A N/A W x D (cm) 0.113 N/A N/A Post Debridement Volume: (cm) Debridement N/A N/A Procedures  Performed: Treatment Notes Electronic Signature(s) Signed: 12/23/2021 1:32:24 PM By: Fredirick Maudlin MD FACS Signed: 12/23/2021 5:27:19 PM By: Blanche East RN Entered By: Fredirick Maudlin on 12/23/2021 13:32:24 -------------------------------------------------------------------------------- Multi-Disciplinary Care Plan Details Patient Name: Date of Service: [name] [facility] UELINE 12/23/2021 1:00 PM Medical Record Number: [id number] Patient Account Number: [id number] Date of Birth/Sex: Treating RN: [birth year] (46 y.o. [name] Primary Care Zymire Turnbo: [name] Other Clinician: Referring Haleema Vanderheyden: Treating Liahna Brickner/Extender: [name] in Treatment: 1 Active Inactive Orientation to the Wound Care Program Nursing Diagnoses: Knowledge deficit related to the wound healing center program Goals: Patient/caregiver will verbalize understanding of the [place] Program Date Initiated: 12/11/2021 Target Resolution Date: 01/08/2022 Goal Status: Active Interventions: Provide education on orientation to the wound center Notes: Wound/Skin Impairment Nursing Diagnoses: Knowledge deficit related to ulceration/compromised skin integrity Goals: Ulcer/skin breakdown will have a volume reduction of 30% by week 4 Date Initiated: 12/11/2021 Target Resolution Date: 01/08/2022 Goal Status: Active Interventions: Assess patient/caregiver ability to obtain necessary supplies Assess ulceration(s) every visit Treatment Activities: Skin care regimen initiated : 12/11/2021 Notes: Electronic Signature(s) Signed: 12/23/2021 5:27:19 PM By: Blanche East RN Entered By: Blanche East on 12/23/2021 13:15:12 -------------------------------------------------------------------------------- Pain Assessment Details Patient Name: Date of Service: [name] [facility] UELINE 12/23/2021 1:00 PM Medical Record Number: [id number] Patient Account Number: [id number] Date of  Birth/Sex: Treating RN: [birth year] (46 y.o. [name] Primary Care Ronie Barnhart: [name] Other Clinician: Referring Janeene Sand: Treating Ayat Drenning/Extender: [name] in Treatment: 1 Active Problems Location of Pain Severity and Description of Pain Patient Has Paino No Site Locations Pain Management and Medication Current Pain Management: Electronic Signature(s) Signed: 12/23/2021 5:27:19 PM By: Blanche East RN Entered By: Blanche East on 12/23/2021 13:03:18 -------------------------------------------------------------------------------- Patient/Caregiver Education Details Patient Name: Date of Service: [name] 9/18/2023andnbsp1:00 PM Medical Record Number: [id number] Patient Account Number: [id number] Date of Birth/Gender: Treating RN: 02-15-1976 (46 y.o. [name] Primary Care Physician: [name] Other Clinician: Referring Physician: Treating Physician/Extender: [name] in Treatment: 1 Education Assessment Education Provided To: Patient Education Topics Provided Wound/Skin Impairment: Methods: Explain/Verbal Responses: Reinforcements needed, State content correctly Electronic Signature(s) Signed: 12/23/2021 5:27:19 PM By: Blanche East RN Entered By: Blanche East on 12/23/2021 13:15:35 -------------------------------------------------------------------------------- Wound Assessment Details Patient Name: Date of Service: [name] [facility]ELINE 12/23/2021 1:00 PM Medical Record Number: [id number] Patient Account Number: [id number] Date  of Birth/Sex: Treating RN: [birth year] (46 y.o. Iver Nestle, Jamie Primary Care Johnell Bas: [name] Other Clinician: Referring Josimar Corning: Treating Loral Campi/Extender: [name] in Treatment: 1 Wound Status Wound Number: 1 Primary Etiology: Abrasion Wound Location: Left, Lateral Ankle Wound Status:  Open Wounding Event: Laceration Comorbid History: Hypertension, Type II Diabetes Date Acquired: 10/24/2021 Weeks Of Treatment: 1 Clustered Wound: No Photos Wound Measurements Length: (cm) 0.8 Width: (cm) 0.6 Depth: (cm) 0.3 Area: (cm) 0.377 Volume: (cm) 0.113 % Reduction in Area: 42.9% % Reduction in Volume: 14.4% Epithelialization: Medium (34-66%) Tunneling: No Undermining: Yes Starting Position (o'clock): 6 Ending Position (o'clock): 9 Maximum Distance: (cm) 0.5 Wound Description Classification: Full Thickness Without Exposed Support Structures Wound Margin: Flat and Intact Exudate Amount: Medium Exudate Type: Serosanguineous Exudate Color: red, brown Foul Odor After Cleansing: No Slough/Fibrino Yes Wound Bed Granulation Amount: Medium (34-66%) Exposed Structure Granulation Quality: Pink Fascia Exposed: No Necrotic Amount: Medium (34-66%) Fat Layer (Subcutaneous Tissue) Exposed: Yes Necrotic Quality: Adherent Slough Tendon Exposed: No Muscle Exposed: No Joint Exposed: No Bone Exposed: No Treatment Notes Wound #1 (Ankle) Wound Laterality: Left, Lateral Cleanser Soap and Water Discharge Instruction: May shower and wash wound with dial antibacterial soap and water prior to dressing change. Peri-Wound Care Sween Lotion (Moisturizing lotion) Discharge Instruction: Apply moisturizing lotion as directed Topical Primary Dressing IODOFLEX 0.9% Cadexomer Iodine Pad 4x6 cm Discharge Instruction: Apply to wound bed as instructed Secondary Dressing Bordered Gauze, 4x4 in Discharge Instruction: Apply over primary dressing as directed. Optifoam Non-Adhesive Dressing, 4x4 in Discharge Instruction: Apply over primary dressing as directed. Secured With Elastic Bandage 4 inch (ACE bandage) Discharge Instruction: Secure with ACE bandage as directed. Kerlix Roll Sterile, 4.5x3.1 (in/yd) Discharge Instruction: Secure with Kerlix as directed. Compression Wrap Compression  Stockings Add-Ons Electronic Signature(s) Signed: 12/23/2021 5:27:19 PM By: Blanche East RN Entered By: Blanche East on 12/23/2021 13:14:26 -------------------------------------------------------------------------------- Vitals Details Patient Name: Date of Service: [name] [facility] UELINE 12/23/2021 1:00 PM Medical Record Number: [id number] Patient Account Number: [id number] Date of Birth/Sex: Treating RN: 1975-08-20 (46 y.o. Iver Nestle, Jamie Primary Care Guerin Lashomb: [name] Other Clinician: Referring Dinna Severs: Treating Trianna Lupien/Extender: [name] in Treatment: 1 Vital Signs Time Taken: 13:00 Temperature (F): 98.7 Height (in): 58 Pulse (bpm): 118 Weight (lbs): 111 Respiratory Rate (breaths/min): 16 Body Mass Index (BMI): 23.2 Blood Pressure (mmHg): 107/67 Reference Range: 80 - 120 mg / dl Electronic Signature(s) Signed: 12/23/2021 5:27:19 PM By: Blanche East RN Entered By: Blanche East on 12/23/2021 13:03:12

## 2021-12-23 NOTE — Progress Notes (Signed)
[name], [name] ([id number]) Visit Report for 12/23/2021 Chief Complaint Document Details Patient Name: Date of Service: [name] 12/23/2021 1:00 PM Medical Record Number: [id number] Patient Account Number: [id number] Date of Birth/Sex: Treating RN: 1976/03/17 (46 y.o. [name] Primary Care Provider: [name] Other Clinician: Referring Provider: Treating Provider/Extender: [name] in Treatment: 1 Information Obtained from: Patient Chief Complaint Patient seen for complaints of Non-Healing Wound. Electronic Signature(s) Signed: 12/23/2021 1:32:51 PM By: Fredirick Maudlin MD FACS Entered By: Fredirick Maudlin on 12/23/2021 13:32:50 -------------------------------------------------------------------------------- Debridement Details Patient Name: Date of Service: [name] [facility] 12/23/2021 1:00 PM Medical Record Number: [id number] Patient Account Number: [id number] Date of Birth/Sex: Treating RN: Jul 11, 1975 (46 y.o. [name] Primary Care Provider: [name] Other Clinician: Referring Provider: Treating Provider/Extender: [name] in Treatment: 1 Debridement Performed for Assessment: Wound #1 Left,Lateral Ankle Performed By: Physician Fredirick Maudlin, MD Debridement Type: Debridement Level of Consciousness (Pre-procedure): Awake and Alert Pre-procedure Verification/Time Out Yes - 13:22 Taken: Start Time: 13:23 T Area Debrided (L x W): otal 0.8 (cm) x 0.6 (cm) = 0.48 (cm) Tissue and other material debrided: Eschar, Slough, Subcutaneous, Slough Level: Skin/Subcutaneous Tissue Debridement Description: Excisional Instrument: Curette Bleeding: Minimum Hemostasis Achieved: Pressure Procedural Pain: 0 Post Procedural Pain: 0 Response to Treatment: Procedure was tolerated well Level of Consciousness (Post- Awake and Alert procedure): Post Debridement Measurements of Total  Wound Length: (cm) 0.8 Width: (cm) 0.6 Depth: (cm) 0.3 Volume: (cm) 0.113 Character of Wound/Ulcer Post Debridement: Requires Further Debridement Post Procedure Diagnosis Same as Pre-procedure Notes Scribed by Dr. Celine Ahr by Blanche East, RN Electronic Signature(s) Signed: 12/23/2021 1:32:45 PM By: Fredirick Maudlin MD FACS Signed: 12/23/2021 5:27:19 PM By: Blanche East RN Entered By: Fredirick Maudlin on 12/23/2021 13:32:45 -------------------------------------------------------------------------------- HPI Details Patient Name: Date of Service: [name] [facility] 12/23/2021 1:00 PM Medical Record Number: [id number] Patient Account Number: [id number] Date of Birth/Sex: Treating RN: [birth year] (46 y.o. [name] Primary Care Provider: [name] Other Clinician: Referring Provider: Treating Provider/Extender: [name] in Treatment: 1 History of Present Illness HPI Description: ADMISSION 12/11/2021 History is somewhat limited secondary to intellectual delay. Some of the history has been garnered from review of the electronic medical record. The patient reports that she was sitting on pavement with her left foot tucked underneath her. She says that she developed a wound on that site. She saw her PCP. They note from that clinic date (11/13/2021: Says that the wound was open but dried without any drainage. Left foot edema was described. A 7-day course of Keflex was prescribed. She is followed by podiatry for routine diabetic foot care. When she saw them on August 15, the provider referred her to a different podiatrist who debrided the wound. He applied Iodosorb and prescribed Silvadene daily dressing changes. The patient states that she has not been changing her dressing. She apparently resides in some sort of facility and she says there are nurses and doctors there. Specific details are difficult to sort out. She denies any significant pain to the  wound. On her left lateral malleolus, there is a circular wound that does seem consistent with the history provided. There is some slough buildup in the center of the wound. No fluctuance, odor, or purulent drainage. 12/16/2021: The wound is about the same size today but the surface is quite a bit cleaner. 12/23/2021: The wound continues to contract. Has developed some undermining but this appears to  be secondary to some perimeter eschar. No concern for infection. Electronic Signature(s) Signed: 12/23/2021 1:33:21 PM By: Fredirick Maudlin MD FACS Entered By: Fredirick Maudlin on 12/23/2021 13:33:21 -------------------------------------------------------------------------------- Physical Exam Details Patient Name: Date of Service: [name] 12/23/2021 1:00 PM Medical Record Number: [id number] Patient Account Number: [id number] Date of Birth/Sex: Treating RN: 29-May-1975 (46 y.o. [name] Primary Care Provider: [name] Other Clinician: Referring Provider: Treating Provider/Extender: [name] in Treatment: 1 Constitutional .Tachycardic, asymptomatic.. . . No acute distress.Marland Kitchen Respiratory Normal work of breathing on room air.. Notes 12/23/2021: The wound continues to contract. Has developed some undermining but this appears to be secondary to some perimeter eschar. No concern for infection. Electronic Signature(s) Signed: 12/23/2021 1:34:22 PM By: Fredirick Maudlin MD FACS Entered By: Fredirick Maudlin on 12/23/2021 13:34:22 -------------------------------------------------------------------------------- Physician Orders Details Patient Name: Date of Service: [name] [facility] 12/23/2021 1:00 PM Medical Record Number: [id number] Patient Account Number: [id number] Date of Birth/Sex: Treating RN: 1975-12-06 (46 y.o. Iver Nestle, [place] Primary Care Provider: [name] Other Clinician: Referring Provider: Treating  Provider/Extender: [name] in Treatment: 1 Verbal / Phone Orders: No Diagnosis Coding ICD-10 Coding Code Description [phone] Non-pressure chronic ulcer of left ankle with fat layer exposed E11.10 Type 2 diabetes mellitus with ketoacidosis without coma E11.622 Type 2 diabetes mellitus with other skin ulcer Follow-up Appointments ppointment in 1 week. - Dr. Celine Ahr rm 4 Return A Monday 12/30/21 at 1:00 PM Nurse Visit: - RM 2 Thursday 12/26/21 at 11:30 AM Anesthetic Wound #1 Left,Lateral Ankle (In clinic) Topical Lidocaine 5% applied to wound bed - prior to debridement Bathing/ Shower/ Hygiene May shower and wash wound with soap and water. - on days when the dressing is changed Wound Treatment Wound #1 - Ankle Wound Laterality: Left, Lateral Cleanser: Soap and Water Every Other Day/15 Days Discharge Instructions: May shower and wash wound with dial antibacterial soap and water prior to dressing change. Peri-Wound Care: Sween Lotion (Moisturizing lotion) Every Other Day/15 Days Discharge Instructions: Apply moisturizing lotion as directed Prim Dressing: IODOFLEX 0.9% Cadexomer Iodine Pad 4x6 cm (Dispense As Written) Every Other Day/15 Days ary Discharge Instructions: Apply to wound bed as instructed Secondary Dressing: Bordered Gauze, 4x4 in (Dispense As Written) Every Other Day/15 Days Discharge Instructions: Apply over primary dressing as directed. Secondary Dressing: Optifoam Non-Adhesive Dressing, 4x4 in (Dispense As Written) Every Other Day/15 Days Discharge Instructions: Apply over primary dressing as directed. Secured With: Elastic Bandage 4 inch (ACE bandage) Every Other Day/15 Days Discharge Instructions: Secure with ACE bandage as directed. Secured With: [employer], 4.5x3.1 (in/yd) (Dispense As Written) Every Other Day/15 Days Discharge Instructions: Secure with Kerlix as directed. Patient Medications llergies: Documentation for  allergies has not been submitted A Notifications Medication Indication Start End prior to debridement 12/23/2021 lidocaine DOSE topical 5 % ointment - ointment topical Electronic Signature(s) Signed: 12/23/2021 1:34:33 PM By: Fredirick Maudlin MD FACS Entered By: Fredirick Maudlin on 12/23/2021 13:34:33 Prescription 12/23/2021 -------------------------------------------------------------------------------- Dub Amis MD Patient Name: Provider: 1975/09/01 [id number] Date of Birth: NPI#Vicente Masson Sex: DEA #: [phone] Phone #: License #: [place] Patient Address: [address] [place] [address] , [place], [place] [address] [phone] Allergies Medication Medication: Route: Strength: Form: lidocaine 5 % topical ointment topical 5% ointment Class: TOPICAL LOCAL ANESTHETICS Dose: Frequency / Time: Indication: ointment topical prior to debridement  Number of Refills: Number of Units: 0 Generic Substitution: Start Date: End Date: One Time Use: Substitution Permitted 0/53/9767 No Note to Pharmacy: Hand Signature: Date(s): Electronic Signature(s) Signed: 12/23/2021 1:40:36 PM By: Fredirick Maudlin MD FACS Entered By: Fredirick Maudlin on 12/23/2021 13:40:36 -------------------------------------------------------------------------------- Problem List Details Patient Name: Date of Service: [name] [facility] 12/23/2021 1:00 PM Medical Record Number: [id number] Patient Account Number: [id number] Date of Birth/Sex: Treating RN: 1975-08-24 (46 y.o. [name] Primary Care Provider: [name] Other Clinician: Referring Provider: Treating Provider/Extender: [name] in Treatment: 1 Active Problems ICD-10 Encounter Code Description Active Date MDM Diagnosis [phone] Non-pressure chronic ulcer of left ankle  with fat layer exposed 12/11/2021 No Yes E11.10 Type 2 diabetes mellitus with ketoacidosis without coma 12/11/2021 No Yes E11.622 Type 2 diabetes mellitus with other skin ulcer 12/11/2021 No Yes Inactive Problems Resolved Problems Electronic Signature(s) Signed: 12/23/2021 1:32:13 PM By: Fredirick Maudlin MD FACS Entered By: Fredirick Maudlin on 12/23/2021 13:32:13 -------------------------------------------------------------------------------- Progress Note Details Patient Name: Date of Service: [name] 12/23/2021 1:00 PM Medical Record Number: [id number] Patient Account Number: [id number] Date of Birth/Sex: Treating RN: 05-16-1975 (46 y.o. [name] Primary Care Provider: [name] Other Clinician: Referring Provider: Treating Provider/Extender: [name] in Treatment: 1 Subjective Chief Complaint Information obtained from Patient Patient seen for complaints of Non-Healing Wound. History of Present Illness (HPI) ADMISSION 12/11/2021 History is somewhat limited secondary to intellectual delay. Some of the history has been garnered from review of the electronic medical record. The patient reports that she was sitting on pavement with her left foot tucked underneath her. She says that she developed a wound on that site. She saw her PCP. They note from that clinic date (11/13/2021: Says that the wound was open but dried without any drainage. Left foot edema was described. A 7-day course of Keflex was prescribed. She is followed by podiatry for routine diabetic foot care. When she saw them on August 15, the provider referred her to a different podiatrist who debrided the wound. He applied Iodosorb and prescribed Silvadene daily dressing changes. The patient states that she has not been changing her dressing. She apparently resides in some sort of facility and she says there are nurses and doctors there. Specific details are difficult to sort out.  She denies any significant pain to the wound. On her left lateral malleolus, there is a circular wound that does seem consistent with the history provided. There is some slough buildup in the center of the wound. No fluctuance, odor, or purulent drainage. 12/16/2021: The wound is about the same size today but the surface is quite a bit cleaner. 12/23/2021: The wound continues to contract. Has developed some undermining but this appears to be secondary to some perimeter eschar. No concern for infection. Patient History Unable to Obtain Patient History due to Altered Mental Status. Information obtained from Patient. Family History Diabetes - Mother,Father, Stroke - Siblings. Social History Never smoker, Marital Status - Single, Alcohol Use - Never, Drug Use - No History, Caffeine Use - Daily. Medical History Cardiovascular Patient has history of Hypertension Endocrine Patient has history of Type II Diabetes Psychiatric Denies history of Anorexia/bulimia, Confinement Anxiety Hospitalization/Surgery History - Cholecystectomy. - pelvic mass. - ventral hernia/ hernia repair. - Hand surgery (right hand). Medical A Surgical History Notes nd Gastrointestinal GERD Endocrine DKA Giant ovarian mass Objective Constitutional Tachycardic, asymptomatic.Marland Kitchen No acute distress.. Vitals Time Taken: 1:00 PM, Height: 58 in, Weight:  111 lbs, BMI: 23.2, Temperature: 98.7 F, Pulse: 118 bpm, Respiratory Rate: 16 breaths/min, Blood Pressure: 107/67 mmHg. Respiratory Normal work of breathing on room air.. General Notes: 12/23/2021: The wound continues to contract. Has developed some undermining but this appears to be secondary to some perimeter eschar. No concern for infection. Integumentary (Hair, Skin) Wound #1 status is Open. Original cause of wound was Laceration. The date acquired was: 10/24/2021. The wound has been in treatment 1 weeks. The wound is located on the Left,Lateral Ankle. The wound measures  0.8cm length x 0.6cm width x 0.3cm depth; 0.377cm^2 area and 0.113cm^3 volume. There is Fat Layer (Subcutaneous Tissue) exposed. There is no tunneling noted, however, there is undermining starting at 6:00 and ending at 9:00 with a maximum distance of 0.5cm. There is a medium amount of serosanguineous drainage noted. The wound margin is flat and intact. There is medium (34-66%) pink granulation within the wound bed. There is a medium (34-66%) amount of necrotic tissue within the wound bed including Adherent Slough. Assessment Active Problems ICD-10 Non-pressure chronic ulcer of left ankle with fat layer exposed Type 2 diabetes mellitus with ketoacidosis without coma Type 2 diabetes mellitus with other skin ulcer Procedures Wound #1 Pre-procedure diagnosis of Wound #1 is an Abrasion located on the Left,Lateral Ankle . There was a Excisional Skin/Subcutaneous Tissue Debridement with a total area of 0.48 sq cm performed by Fredirick Maudlin, MD. With the following instrument(s): Curette Material removed includes Eschar, Subcutaneous Tissue, and Slough. No specimens were taken. A time out was conducted at 13:22, prior to the start of the procedure. A Minimum amount of bleeding was controlled with Pressure. The procedure was tolerated well with a pain level of 0 throughout and a pain level of 0 following the procedure. Post Debridement Measurements: 0.8cm length x 0.6cm width x 0.3cm depth; 0.113cm^3 volume. Character of Wound/Ulcer Post Debridement requires further debridement. Post procedure Diagnosis Wound #1: Same as Pre-Procedure General Notes: Scribed by Dr. Celine Ahr by Blanche East, RN. Plan Follow-up Appointments: Return Appointment in 1 week. - Dr. Celine Ahr rm 4 Monday 12/30/21 at 1:00 PM Nurse Visit: - RM 2 Thursday 12/26/21 at 11:30 AM Anesthetic: Wound #1 Left,Lateral Ankle: (In clinic) Topical Lidocaine 5% applied to wound bed - prior to debridement Bathing/ Shower/ Hygiene: May shower  and wash wound with soap and water. - on days when the dressing is changed The following medication(s) was prescribed: lidocaine topical 5 % ointment ointment topical for prior to debridement was prescribed at facility WOUND #1: - Ankle Wound Laterality: Left, Lateral Cleanser: Soap and Water Every Other Day/15 Days Discharge Instructions: May shower and wash wound with dial antibacterial soap and water prior to dressing change. Peri-Wound Care: Sween Lotion (Moisturizing lotion) Every Other Day/15 Days Discharge Instructions: Apply moisturizing lotion as directed Prim Dressing: IODOFLEX 0.9% Cadexomer Iodine Pad 4x6 cm (Dispense As Written) Every Other Day/15 Days ary Discharge Instructions: Apply to wound bed as instructed Secondary Dressing: Bordered Gauze, 4x4 in (Dispense As Written) Every Other Day/15 Days Discharge Instructions: Apply over primary dressing as directed. Secondary Dressing: Optifoam Non-Adhesive Dressing, 4x4 in (Dispense As Written) Every Other Day/15 Days Discharge Instructions: Apply over primary dressing as directed. Secured With: Elastic Bandage 4 inch (ACE bandage) Every Other Day/15 Days Discharge Instructions: Secure with ACE bandage as directed. Secured With: [employer], 4.5x3.1 (in/yd) (Dispense As Written) Every Other Day/15 Days Discharge Instructions: Secure with Kerlix as directed. 12/23/2021: The wound continues to contract. Has developed some undermining but this  appears to be secondary to some perimeter eschar. No concern for infection. I used a curette to debride the perimeter eschar and saucerized the wound. I also debrided slough and nonviable subcutaneous tissue from the site. We will continue using Iodoflex/Iodosorb with bordered gauze dressing and an Ace bandage. Follow-up in 1 week. Electronic Signature(s) Signed: 12/23/2021 1:38:06 PM By: Fredirick Maudlin MD FACS Entered By: Fredirick Maudlin on 12/23/2021  13:38:06 -------------------------------------------------------------------------------- HxROS Details Patient Name: Date of Service: [name] [facility]ELINE 12/23/2021 1:00 PM Medical Record Number: [id number] Patient Account Number: [id number] Date of Birth/Sex: Treating RN: [birth year] (46 y.o. [name] Primary Care Provider: [name] Other Clinician: Referring Provider: Treating Provider/Extender: [name] in Treatment: 1 Unable to Obtain Patient History due to Altered Mental Status Information Obtained From Patient Cardiovascular Medical History: Positive for: Hypertension Gastrointestinal Medical History: Past Medical History Notes: GERD Endocrine Medical History: Positive for: Type II Diabetes Past Medical History Notes: DKA Giant ovarian mass Treated with: Insulin, Oral agents Blood sugar tested every day: Yes Tested : 2 Psychiatric Medical History: Negative for: Anorexia/bulimia; Confinement Anxiety Immunizations Pneumococcal Vaccine: Received Pneumococcal Vaccination: Yes Received Pneumococcal Vaccination On or After 60th Birthday: No Implantable Devices No devices added Hospitalization / Surgery History Type of Hospitalization/Surgery Cholecystectomy pelvic mass ventral hernia/ hernia repair Hand surgery (right hand) Family and Social History Diabetes: Yes - Mother,Father; Stroke: Yes - Siblings; Never smoker; Marital Status - Single; Alcohol Use: Never; Drug Use: No History; Caffeine Use: Daily; Financial Concerns: No; Food, Clothing or Shelter Needs: No; Transportation Concerns: No Engineer, maintenance) Signed: 12/23/2021 1:40:49 PM By: Fredirick Maudlin MD FACS Signed: 12/23/2021 5:27:19 PM By: Blanche East RN Entered By: Fredirick Maudlin on 12/23/2021 13:33:55 -------------------------------------------------------------------------------- SuperBill Details Patient Name: Date of Service: [name]  Katherine Basset 12/23/2021 Medical Record Number: [id number] Patient Account Number: [id number] Date of Birth/Sex: Treating RN: November 15, 1975 (46 y.o. Iver Nestle, [place] Primary Care Provider: [name] Other Clinician: Referring Provider: Treating Provider/Extender: [name] in Treatment: 1 Diagnosis Coding ICD-10 Codes Code Description [phone] Non-pressure chronic ulcer of left ankle with fat layer exposed E11.10 Type 2 diabetes mellitus with ketoacidosis without coma E11.622 Type 2 diabetes mellitus with other skin ulcer Facility Procedures CPT4 Code: [id number] Description: 70962 - DEB SUBQ TISSUE 20 SQ CM/< ICD-10 Diagnosis Description L97.322 Non-pressure chronic ulcer of left ankle with fat layer exposed Modifier: Quantity: 1 Physician Procedures : CPT4 Code Description Modifier [id number] - WC PHYS LEVEL 3 - EST PT 25 ICD-10 Diagnosis Description L97.322 Non-pressure chronic ulcer of left ankle with fat layer exposed E11.10 Type 2 diabetes mellitus with ketoacidosis without coma E11.622 Type  2 diabetes mellitus with other skin ulcer Quantity: 1 Electronic Signature(s) Signed: 12/23/2021 1:38:22 PM By: Fredirick Maudlin MD FACS Entered By: Fredirick Maudlin on 12/23/2021 13:38:22

## 2021-12-26 ENCOUNTER — Encounter (HOSPITAL_BASED_OUTPATIENT_CLINIC_OR_DEPARTMENT_OTHER): Payer: Medicaid Other | Admitting: General Surgery

## 2021-12-26 DIAGNOSIS — E11622 Type 2 diabetes mellitus with other skin ulcer: Secondary | ICD-10-CM | POA: Diagnosis not present

## 2021-12-26 NOTE — Progress Notes (Signed)
[name], [name] ([id number]) Visit Report for 12/26/2021 SuperBill Details Patient Name: Date of Service: [name] 12/26/2021 Medical Record Number: [id number] Patient Account Number: [id number] Date of Birth/Sex: Treating RN: 10-01-1975 (46 y.o. [name] Primary Care Provider: [name] Other Clinician: Referring Provider: Treating Provider/Extender: [name] in Treatment: 2 Diagnosis Coding ICD-10 Codes Code Description [phone] Non-pressure chronic ulcer of left ankle with fat layer exposed E11.10 Type 2 diabetes mellitus with ketoacidosis without coma E11.622 Type 2 diabetes mellitus with other skin ulcer Facility Procedures CPT4 Code Description Modifier Quantity [id number] - WOUND CARE VISIT-LEV 3 EST PT 25 1 Electronic Signature(s) Signed: 12/26/2021 3:00:56 PM By: Fredirick Maudlin MD FACS Signed: 12/26/2021 4:18:53 PM By: Blanche East RN Entered By: Blanche East on 12/26/2021 14:40:36

## 2021-12-26 NOTE — Progress Notes (Signed)
NHYIRA, LEANO ([id number]) Visit Report for 12/26/2021 Arrival Information Details Patient Name: Date of Service: [name] 12/26/2021 11:30 A M Medical Record Number: [id number] Patient Account Number: [id number] Date of Birth/Sex: Treating RN: 06-07-1975 (46 y.o. [name] Primary Care Trevar Boehringer: [name] Other Clinician: Referring Gisselle Galvis: Treating Pantelis Elgersma/Extender: [name] in Treatment: 2 Visit Information History Since Last Visit Added or deleted any medications: No Patient Arrived: Ambulatory Any new allergies or adverse reactions: No Arrival Time: 11:15 Had a fall or experienced change in No Accompanied By: self activities of daily living that may affect Transfer Assistance: None risk of falls: Patient Identification Verified: Yes Signs or symptoms of abuse/neglect since last visito No Patient Requires Transmission-Based Precautions: No Hospitalized since last visit: No Patient Has Alerts: Yes Implantable device outside of the clinic excluding No Patient Alerts: ABI LLE 1.22 cellular tissue based products placed in the center since last visit: Has Dressing in Place as Prescribed: Yes Pain Present Now: No Electronic Signature(s) Signed: 12/26/2021 5:27:48 PM By: Dellie Catholic RN Entered By: Dellie Catholic on 12/26/2021 11:17:50 -------------------------------------------------------------------------------- Clinic Level of Care Assessment Details Patient Name: Date of Service: [name] 12/26/2021 11:30 A M Medical Record Number: [id number] Patient Account Number: [id number] Date of Birth/Sex: Treating RN: 1976/02/18 (45 y.o. Iver Nestle, [place] Primary Care Taggert Bozzi: [name] Other Clinician: Referring Zuma Hust: Treating Michell Kader/Extender: [name] in Treatment: 2 Clinic Level of Care Assessment Items TOOL 4 Quantity Score X- 1 0 Use when only an EandM  is performed on FOLLOW-UP visit ASSESSMENTS - Nursing Assessment / Reassessment X- 1 10 Reassessment of Co-morbidities (includes updates in patient status) X- 1 5 Reassessment of Adherence to Treatment Plan ASSESSMENTS - Wound and Skin A ssessment / Reassessment X - Simple Wound Assessment / Reassessment - one wound 1 5 '[]'$  - 0 Complex Wound Assessment / Reassessment - multiple wounds '[]'$  - 0 Dermatologic / Skin Assessment (not related to wound area) ASSESSMENTS - Focused Assessment '[]'$  - 0 Circumferential Edema Measurements - multi extremities '[]'$  - 0 Nutritional Assessment / Counseling / Intervention '[]'$  - 0 Lower Extremity Assessment (monofilament, tuning fork, pulses) '[]'$  - 0 Peripheral Arterial Disease Assessment (using hand held doppler) ASSESSMENTS - Ostomy and/or Continence Assessment and Care '[]'$  - 0 Incontinence Assessment and Management '[]'$  - 0 Ostomy Care Assessment and Management (repouching, etc.) PROCESS - Coordination of Care X - Simple Patient / Family Education for ongoing care 1 15 '[]'$  - 0 Complex (extensive) Patient / Family Education for ongoing care X- 1 10 Staff obtains Programmer, systems, Records, T Results / Process Orders est X- 1 10 Staff telephones HHA, Nursing Homes / Clarify orders / etc '[]'$  - 0 Routine Transfer to another Facility (non-emergent condition) '[]'$  - 0 Routine Hospital Admission (non-emergent condition) '[]'$  - 0 New Admissions / Biomedical engineer / Ordering NPWT Apligraf, etc. , '[]'$  - 0 Emergency Hospital Admission (emergent condition) X- 1 10 Simple Discharge Coordination '[]'$  - 0 Complex (extensive) Discharge Coordination PROCESS - Special Needs '[]'$  - 0 Pediatric / Minor Patient Management '[]'$  - 0 Isolation Patient Management '[]'$  - 0 Hearing / Language / Visual special needs '[]'$  - 0 Assessment of Community assistance (transportation, D/C planning, etc.) '[]'$  - 0 Additional assistance / Altered mentation '[]'$  - 0 Support Surface(s)  Assessment (bed, cushion, seat, etc.) INTERVENTIONS - Wound Cleansing / Measurement X - Simple Wound Cleansing - one wound 1 5 '[]'$  - 0 Complex Wound Cleansing -  multiple wounds '[]'$  - 0 Wound Imaging (photographs - any number of wounds) '[]'$  - 0 Wound Tracing (instead of photographs) X- 1 5 Simple Wound Measurement - one wound '[]'$  - 0 Complex Wound Measurement - multiple wounds INTERVENTIONS - Wound Dressings X - Small Wound Dressing one or multiple wounds 1 10 '[]'$  - 0 Medium Wound Dressing one or multiple wounds '[]'$  - 0 Large Wound Dressing one or multiple wounds '[]'$  - 0 Application of Medications - topical '[]'$  - 0 Application of Medications - injection INTERVENTIONS - Miscellaneous '[]'$  - 0 External ear exam '[]'$  - 0 Specimen Collection (cultures, biopsies, blood, body fluids, etc.) '[]'$  - 0 Specimen(s) / Culture(s) sent or taken to Lab for analysis '[]'$  - 0 Patient Transfer (multiple staff / Civil Service fast streamer / Similar devices) '[]'$  - 0 Simple Staple / Suture removal (25 or less) '[]'$  - 0 Complex Staple / Suture removal (26 or more) '[]'$  - 0 Hypo / Hyperglycemic Management (close monitor of Blood Glucose) '[]'$  - 0 Ankle / Brachial Index (ABI) - do not check if billed separately X- 1 5 Vital Signs Has the patient been seen at the hospital within the last three years: Yes Total Score: 90 Level Of Care: New/Established - Level 3 Electronic Signature(s) Signed: 12/26/2021 4:18:53 PM By: Blanche East RN Entered By: Blanche East on 12/26/2021 14:39:49 -------------------------------------------------------------------------------- Encounter Discharge Information Details Patient Name: Date of Service: [name] [facility] 12/26/2021 11:30 A M Medical Record Number: [id number] Patient Account Number: [id number] Date of Birth/Sex: Treating RN: [birth year] (46 y.o. [name] Primary Care Lowell Makara: [name] Other Clinician: Referring Jonel Sick: Treating Lataria Courser/Extender: [name] in Treatment: 2 Encounter Discharge Information Items Discharge Condition: Stable Ambulatory Status: Ambulatory Discharge Destination: Home Transportation: Private Auto Accompanied By: self Schedule Follow-up Appointment: Yes Clinical Summary of Care: Electronic Signature(s) Signed: 12/26/2021 4:18:53 PM By: Blanche East RN Entered By: Blanche East on 12/26/2021 14:40:27 -------------------------------------------------------------------------------- Patient/Caregiver Education Details Patient Name: Date of Service: [name] 9/21/2023andnbsp11:30 A M Medical Record Number: [id number] Patient Account Number: [id number] Date of Birth/Gender: Treating RN: [birth year] (46 y.o. [name] Primary Care Physician: [name] Other Clinician: Referring Physician: Treating Physician/Extender: [name] in Treatment: 2 Education Assessment Education Provided To: Patient Education Topics Provided Wound/Skin Impairment: Methods: Explain/Verbal Responses: Reinforcements needed, State content correctly Electronic Signature(s) Signed: 12/26/2021 4:18:53 PM By: Blanche East RN Entered By: Blanche East on 12/26/2021 14:40:11 -------------------------------------------------------------------------------- Wound Assessment Details Patient Name: Date of Service: [name] 12/26/2021 11:30 A M Medical Record Number: [id number] Patient Account Number: [id number] Date of Birth/Sex: Treating RN: [birth year] (46 y.o. [name] Primary Care Laverda Stribling: [name] Other Clinician: Referring Leonna Schlee: Treating Caress Reffitt/Extender: [name] in Treatment: 2 Wound Status Wound Number: 1 Primary Etiology: Abrasion Wound Location: Left, Lateral Ankle Wound Status: Open Wounding Event: Laceration Comorbid History: Hypertension, Type II Diabetes Date Acquired:  10/24/2021 Weeks Of Treatment: 2 Clustered Wound: No Wound Measurements Length: (cm) 0.8 Width: (cm) 0.6 Depth: (cm) 0.3 Area: (cm) 0.377 Volume: (cm) 0.113 % Reduction in Area: 42.9% % Reduction in Volume: 14.4% Epithelialization: Medium (34-66%) Tunneling: No Undermining: Yes Starting Position (o'clock): 6 Ending Position (o'clock): 9 Maximum Distance: (cm) 0.5 Wound Description Classification: Full Thickness Without Exposed Support Structures Wound Margin: Flat and Intact Exudate Amount: Medium Exudate Type: Serosanguineous Exudate Color: red, brown Foul Odor After Cleansing: No Slough/Fibrino Yes Wound Bed Granulation Amount: Medium (34-66%) Exposed  Structure Granulation Quality: Pink Fascia Exposed: No Necrotic Amount: Medium (34-66%) Fat Layer (Subcutaneous Tissue) Exposed: Yes Necrotic Quality: Adherent Slough Tendon Exposed: No Muscle Exposed: No Joint Exposed: No Bone Exposed: No Treatment Notes Wound #1 (Ankle) Wound Laterality: Left, Lateral Cleanser Soap and Water Discharge Instruction: May shower and wash wound with dial antibacterial soap and water prior to dressing change. Peri-Wound Care Sween Lotion (Moisturizing lotion) Discharge Instruction: Apply moisturizing lotion as directed Topical Primary Dressing IODOFLEX 0.9% Cadexomer Iodine Pad 4x6 cm Discharge Instruction: Apply to wound bed as instructed Secondary Dressing Bordered Gauze, 4x4 in Discharge Instruction: Apply over primary dressing as directed. Optifoam Non-Adhesive Dressing, 4x4 in Discharge Instruction: Apply over primary dressing as directed. Secured With Elastic Bandage 4 inch (ACE bandage) Discharge Instruction: Secure with ACE bandage as directed. Kerlix Roll Sterile, 4.5x3.1 (in/yd) Discharge Instruction: Secure with Kerlix as directed. Compression Wrap Compression Stockings Add-Ons Electronic Signature(s) Signed: 12/26/2021 5:27:48 PM By: Dellie Catholic RN Entered By:  Dellie Catholic on 12/26/2021 11:18:50 -------------------------------------------------------------------------------- Vitals Details Patient Name: Date of Service: [name] [facility]ELINE 12/26/2021 11:30 A M Medical Record Number: [id number] Patient Account Number: [id number] Date of Birth/Sex: Treating RN: December 23, 1975 (46 y.o. [name] Primary Care Dquan Cortopassi: [name] Other Clinician: Referring Mykael Batz: Treating Brandi Tomlinson/Extender: [name] in Treatment: 2 Vital Signs Time Taken: 11:17 Temperature (F): 97.9 Height (in): 58 Pulse (bpm): 88 Weight (lbs): 111 Respiratory Rate (breaths/min): 16 Body Mass Index (BMI): 23.2 Blood Pressure (mmHg): 114/67 Reference Range: 80 - 120 mg / dl Electronic Signature(s) Signed: 12/26/2021 5:27:48 PM By: Dellie Catholic RN Entered By: Dellie Catholic on 12/26/2021 11:18:14

## 2021-12-30 ENCOUNTER — Encounter (HOSPITAL_BASED_OUTPATIENT_CLINIC_OR_DEPARTMENT_OTHER): Payer: Medicaid Other | Admitting: General Surgery

## 2021-12-30 DIAGNOSIS — E11622 Type 2 diabetes mellitus with other skin ulcer: Secondary | ICD-10-CM | POA: Diagnosis not present

## 2022-01-03 ENCOUNTER — Encounter (HOSPITAL_BASED_OUTPATIENT_CLINIC_OR_DEPARTMENT_OTHER): Payer: Medicaid Other | Admitting: General Surgery

## 2022-01-03 DIAGNOSIS — E11622 Type 2 diabetes mellitus with other skin ulcer: Secondary | ICD-10-CM | POA: Diagnosis not present

## 2022-01-03 NOTE — Progress Notes (Signed)
JULEE, STOLL ([id number]) Visit Report for 12/30/2021 Chief Complaint Document Details Patient Name: Date of Service: [name] 12/30/2021 1:00 PM Medical Record Number: [id number] Patient Account Number: [id number] Date of Birth/Sex: Treating RN: 08-Oct-1975 (46 y.o. [name] Primary Care Provider: [name] Other Clinician: Referring Provider: Treating Provider/Extender: [name] in Treatment: 2 Information Obtained from: Patient Chief Complaint Patient seen for complaints of Non-Healing Wound. Electronic Signature(s) Signed: 12/30/2021 1:24:10 PM By: Fredirick Maudlin MD FACS Entered By: Fredirick Maudlin on 12/30/2021 13:24:10 -------------------------------------------------------------------------------- Debridement Details Patient Name: Date of Service: [name] [facility] UELINE 12/30/2021 1:00 PM Medical Record Number: [id number] Patient Account Number: [id number] Date of Birth/Sex: Treating RN: March 23, 1976 (46 y.o. Iver Nestle, [place] Primary Care Provider: [name] Other Clinician: Referring Provider: Treating Provider/Extender: [name] in Treatment: 2 Debridement Performed for Assessment: Wound #1 Left,Lateral Ankle Performed By: Physician Fredirick Maudlin, MD Debridement Type: Debridement Level of Consciousness (Pre-procedure): Awake and Alert Pre-procedure Verification/Time Out Yes - 12:59 Taken: Start Time: 13:00 Pain Control: Lidocaine 5% topical ointment T Area Debrided (L x W): otal 1 (cm) x 1.2 (cm) = 1.2 (cm) Tissue and other material debrided: Eschar, [place], Other: Skin removed Level: Non-Viable Tissue Debridement Description: Selective/Open Wound Instrument: Curette, Forceps, Scissors Bleeding: Minimum Hemostasis Achieved: Pressure Procedural Pain: 0 Post Procedural Pain: 0 Response to Treatment: Procedure was tolerated well Level of Consciousness (Post-  Awake and Alert procedure): Post Debridement Measurements of Total Wound Length: (cm) 1 Width: (cm) 1.2 Depth: (cm) 0.3 Volume: (cm) 0.283 Character of Wound/Ulcer Post Debridement: Requires Further Debridement Post Procedure Diagnosis Same as Pre-procedure Notes Scribed for Dr. Celine Ahr by Blanche East, RN Electronic Signature(s) Signed: 12/30/2021 1:59:41 PM By: Fredirick Maudlin MD FACS Signed: 01/03/2022 4:38:59 PM By: Blanche East RN Entered By: Blanche East on 12/30/2021 13:03:01 -------------------------------------------------------------------------------- HPI Details Patient Name: Date of Service: [name] UELINE 12/30/2021 1:00 PM Medical Record Number: [id number] Patient Account Number: [id number] Date of Birth/Sex: Treating RN: 11-Feb-1976 (46 y.o. [name] Primary Care Provider: [name] Other Clinician: Referring Provider: Treating Provider/Extender: [name] in Treatment: 2 History of Present Illness HPI Description: ADMISSION 12/11/2021 History is somewhat limited secondary to intellectual delay. Some of the history has been garnered from review of the electronic medical record. The patient reports that she was sitting on pavement with her left foot tucked underneath her. She says that she developed a wound on that site. She saw her PCP. They note from that clinic date (11/13/2021: Says that the wound was open but dried without any drainage. Left foot edema was described. A 7-day course of Keflex was prescribed. She is followed by podiatry for routine diabetic foot care. When she saw them on August 15, the provider referred her to a different podiatrist who debrided the wound. He applied Iodosorb and prescribed Silvadene daily dressing changes. The patient states that she has not been changing her dressing. She apparently resides in some sort of facility and she says there are nurses and doctors there. Specific details are  difficult to sort out. She denies any significant pain to the wound. On her left lateral malleolus, there is a circular wound that does seem consistent with the history provided. There is some slough buildup in the center of the wound. No fluctuance, odor, or purulent drainage. 12/16/2021: The wound is about the same size today but the surface is quite a bit cleaner. 12/23/2021: The wound  continues to contract. Has developed some undermining but this appears to be secondary to some perimeter eschar. No concern for infection. 12/30/2021: The area of undermining has broken down and she now has 3 small areas that are open. They all connect under the thin bands of skin. Her drainage has decreased. There is a little bit of slough and eschar accumulation. Electronic Signature(s) Signed: 12/30/2021 1:24:56 PM By: Fredirick Maudlin MD FACS Entered By: Fredirick Maudlin on 12/30/2021 13:24:56 -------------------------------------------------------------------------------- Physical Exam Details Patient Name: Date of Service: [name] 12/30/2021 1:00 PM Medical Record Number: [id number] Patient Account Number: [id number] Date of Birth/Sex: Treating RN: 04-23-1975 (46 y.o. [name] Primary Care Provider: [name] Other Clinician: Referring Provider: Treating Provider/Extender: [name] in Treatment: 2 Constitutional . Slightly tachycardic, asymptomatic. . . No acute distress.Marland Kitchen Respiratory Normal work of breathing on room air.. Notes 12/30/2021: The area of undermining has broken down and she now has 3 small areas that are open. They all connect under the thin bands of skin. Her drainage has decreased. There is a little bit of slough and eschar accumulation Electronic Signature(s) Signed: 12/30/2021 1:28:17 PM By: Fredirick Maudlin MD FACS Entered By: Fredirick Maudlin on 12/30/2021  13:28:17 -------------------------------------------------------------------------------- Physician Orders Details Patient Name: Date of Service: [name] [facility] [address] UELINE 12/30/2021 1:00 PM Medical Record Number: [id number] Patient Account Number: [id number] Date of Birth/Sex: Treating RN: 27-Jul-1975 (46 y.o. Iver Nestle, [place] Primary Care Provider: [name] Other Clinician: Referring Provider: Treating Provider/Extender: [name] in Treatment: 2 Verbal / Phone Orders: No Diagnosis Coding ICD-10 Coding Code Description [phone] Non-pressure chronic ulcer of left ankle with fat layer exposed E11.10 Type 2 diabetes mellitus with ketoacidosis without coma E11.622 Type 2 diabetes mellitus with other skin ulcer Follow-up Appointments ppointment in 1 week. - Dr. Celine Ahr rm 4 Return A Monday Nurse Visit: - Rm 2 Friday 01/03/22 at 11:30 AM Anesthetic Wound #1 Left,Lateral Ankle (In clinic) Topical Lidocaine 4% applied to wound bed - in clinic, prior to debridement Bathing/ Shower/ Hygiene May shower and wash wound with soap and water. - on days when the dressing is changed Wound Treatment Wound #1 - Ankle Wound Laterality: Left, Lateral Cleanser: Soap and Water Discharge Instructions: May shower and wash wound with dial antibacterial soap and water prior to dressing change. Peri-Wound Care: Sween Lotion (Moisturizing lotion) Discharge Instructions: Apply moisturizing lotion as directed Prim Dressing: KerraCel Ag Gelling Fiber Dressing, 2x2 in (silver alginate) ary Discharge Instructions: Apply silver alginate to wound bed as instructed Secondary Dressing: Bordered Gauze, 4x4 in Discharge Instructions: Apply over primary dressing as directed. Secondary Dressing: Optifoam Non-Adhesive Dressing, 4x4 in Discharge Instructions: Apply over primary dressing as directed. Secured With: [employer], 4.5x3.1 (in/yd) Discharge Instructions: Secure with  Kerlix as directed. Secured With: 42M Medipore Public affairs consultant Surgical T 2x10 (in/yd) ape Discharge Instructions: Secure with tape as directed. Electronic Signature(s) Signed: 12/30/2021 1:28:30 PM By: Fredirick Maudlin MD FACS Entered By: Fredirick Maudlin on 12/30/2021 13:28:29 -------------------------------------------------------------------------------- Problem List Details Patient Name: Date of Service: [name] [facility] UELINE 12/30/2021 1:00 PM Medical Record Number: [id number] Patient Account Number: [id number] Date of Birth/Sex: Treating RN: Jul 06, 1975 (46 y.o. [name] Primary Care Provider: [name] Other Clinician: Referring Provider: Treating Provider/Extender: [name] in Treatment: 2 Active Problems ICD-10 Encounter Code Description Active Date MDM Diagnosis [phone] Non-pressure chronic ulcer of left ankle with fat layer exposed 12/11/2021 No Yes E11.10 Type  2 diabetes mellitus with ketoacidosis without coma 12/11/2021 No Yes E11.622 Type 2 diabetes mellitus with other skin ulcer 12/11/2021 No Yes Inactive Problems Resolved Problems Electronic Signature(s) Signed: 12/30/2021 1:23:59 PM By: Fredirick Maudlin MD FACS Entered By: Fredirick Maudlin on 12/30/2021 13:23:59 -------------------------------------------------------------------------------- Progress Note Details Patient Name: Date of Service: [name] 12/30/2021 1:00 PM Medical Record Number: [id number] Patient Account Number: [id number] Date of Birth/Sex: Treating RN: 06-Jul-1975 (46 y.o. [name] Primary Care Provider: [name] Other Clinician: Referring Provider: Treating Provider/Extender: [name] in Treatment: 2 Subjective Chief Complaint Information obtained from Patient Patient seen for complaints of Non-Healing Wound. History of Present Illness (HPI) ADMISSION 12/11/2021 History is somewhat limited  secondary to intellectual delay. Some of the history has been garnered from review of the electronic medical record. The patient reports that she was sitting on pavement with her left foot tucked underneath her. She says that she developed a wound on that site. She saw her PCP. They note from that clinic date (11/13/2021: Says that the wound was open but dried without any drainage. Left foot edema was described. A 7-day course of Keflex was prescribed. She is followed by podiatry for routine diabetic foot care. When she saw them on August 15, the provider referred her to a different podiatrist who debrided the wound. He applied Iodosorb and prescribed Silvadene daily dressing changes. The patient states that she has not been changing her dressing. She apparently resides in some sort of facility and she says there are nurses and doctors there. Specific details are difficult to sort out. She denies any significant pain to the wound. On her left lateral malleolus, there is a circular wound that does seem consistent with the history provided. There is some slough buildup in the center of the wound. No fluctuance, odor, or purulent drainage. 12/16/2021: The wound is about the same size today but the surface is quite a bit cleaner. 12/23/2021: The wound continues to contract. Has developed some undermining but this appears to be secondary to some perimeter eschar. No concern for infection. 12/30/2021: The area of undermining has broken down and she now has 3 small areas that are open. They all connect under the thin bands of skin. Her drainage has decreased. There is a little bit of slough and eschar accumulation. Patient History Unable to Obtain Patient History due to Altered Mental Status. Information obtained from Patient. Family History Diabetes - Mother,Father, Stroke - Siblings. Social History Never smoker, Marital Status - Single, Alcohol Use - Never, Drug Use - No History, Caffeine Use -  Daily. Medical History Cardiovascular Patient has history of Hypertension Endocrine Patient has history of Type II Diabetes Psychiatric Denies history of Anorexia/bulimia, Confinement Anxiety Hospitalization/Surgery History - Cholecystectomy. - pelvic mass. - ventral hernia/ hernia repair. - Hand surgery (right hand). Medical A Surgical History Notes nd Gastrointestinal GERD Endocrine DKA Giant ovarian mass Objective Constitutional Slightly tachycardic, asymptomatic. No acute distress.. Vitals Time Taken: 12:44 PM, Height: 58 in, Weight: 111 lbs, BMI: 23.2, Temperature: 98.9 F, Pulse: 103 bpm, Respiratory Rate: 16 breaths/min, Blood Pressure: 108/64 mmHg. Respiratory Normal work of breathing on room air.. General Notes: 12/30/2021: The area of undermining has broken down and she now has 3 small areas that are open. They all connect under the thin bands of skin. Her drainage has decreased. There is a little bit of slough and eschar accumulation Integumentary (Hair, Skin) Wound #1 status is Open. Original cause of wound was Laceration.  The date acquired was: 10/24/2021. The wound has been in treatment 2 weeks. The wound is located on the Left,Lateral Ankle. The wound measures 1cm length x 1.2cm width x 0.3cm depth; 0.942cm^2 area and 0.283cm^3 volume. There is Fat Layer (Subcutaneous Tissue) exposed. There is no tunneling or undermining noted. There is a medium amount of serosanguineous drainage noted. The wound margin is flat and intact. There is medium (34-66%) pink granulation within the wound bed. There is a medium (34-66%) amount of necrotic tissue within the wound bed including Adherent Slough. Assessment Active Problems ICD-10 Non-pressure chronic ulcer of left ankle with fat layer exposed Type 2 diabetes mellitus with ketoacidosis without coma Type 2 diabetes mellitus with other skin ulcer Procedures Wound #1 Pre-procedure diagnosis of Wound #1 is an Abrasion located on  the Left,Lateral Ankle . There was a Selective/Open Wound Non-Viable Tissue Debridement with a total area of 1.2 sq cm performed by Fredirick Maudlin, MD. With the following instrument(s): Curette, Forceps, and Scissors Material removed includes Eschar, Slough, and Other: Skin removed after achieving pain control using Lidocaine 5% topical ointment. A time out was conducted at 12:59, prior to the start of the procedure. A Minimum amount of bleeding was controlled with Pressure. The procedure was tolerated well with a pain level of 0 throughout and a pain level of 0 following the procedure. Post Debridement Measurements: 1cm length x 1.2cm width x 0.3cm depth; 0.283cm^3 volume. Character of Wound/Ulcer Post Debridement requires further debridement. Post procedure Diagnosis Wound #1: Same as Pre-Procedure General Notes: Scribed for Dr. Celine Ahr by Blanche East, RN. Plan Follow-up Appointments: Return Appointment in 1 week. - Dr. Celine Ahr rm 4 Monday Nurse Visit: - Rm 2 Friday 01/03/22 at 11:30 AM Anesthetic: Wound #1 Left,Lateral Ankle: (In clinic) Topical Lidocaine 4% applied to wound bed - in clinic, prior to debridement Bathing/ Shower/ Hygiene: May shower and wash wound with soap and water. - on days when the dressing is changed WOUND #1: - Ankle Wound Laterality: Left, Lateral Cleanser: Soap and Water Discharge Instructions: May shower and wash wound with dial antibacterial soap and water prior to dressing change. Peri-Wound Care: Sween Lotion (Moisturizing lotion) Discharge Instructions: Apply moisturizing lotion as directed Prim Dressing: KerraCel Ag Gelling Fiber Dressing, 2x2 in (silver alginate) ary Discharge Instructions: Apply silver alginate to wound bed as instructed Secondary Dressing: Bordered Gauze, 4x4 in Discharge Instructions: Apply over primary dressing as directed. Secondary Dressing: Optifoam Non-Adhesive Dressing, 4x4 in Discharge Instructions: Apply over primary dressing  as directed. Secured With: [employer], 4.5x3.1 (in/yd) Discharge Instructions: Secure with Kerlix as directed. Secured With: 78M Medipore Public affairs consultant Surgical T 2x10 (in/yd) ape Discharge Instructions: Secure with tape as directed. 12/30/2021: The area of undermining has broken down and she now has 3 small areas that are open. They all connect under the thin bands of skin. Her drainage has decreased. There is a little bit of slough and eschar accumulation Based curette debride slough and eschar from the wound. I used scissors and forceps to excise the bands of skin between the 3 wounds, creating 1 larger wound. The wound is good enough that I do not think we need Iodoflex any longer; we will change to silver alginate. She will have a nurse visit later this week and see me again next Monday. Electronic Signature(s) Signed: 12/30/2021 1:29:11 PM By: Fredirick Maudlin MD FACS Entered By: Fredirick Maudlin on 12/30/2021 13:29:11 -------------------------------------------------------------------------------- HxROS Details Patient Name: Date of Service: TRO Watt Climes Katherine Basset 12/30/2021 1:00 PM  Medical Record Number: [id number] Patient Account Number: [id number] Date of Birth/Sex: Treating RN: 02/28/1976 (46 y.o. [name] Primary Care Provider: [name] Other Clinician: Referring Provider: Treating Provider/Extender: [name] in Treatment: 2 Unable to Obtain Patient History due to Altered Mental Status Information Obtained From Patient Cardiovascular Medical History: Positive for: Hypertension Gastrointestinal Medical History: Past Medical History Notes: GERD Endocrine Medical History: Positive for: Type II Diabetes Past Medical History Notes: DKA Giant ovarian mass Treated with: Insulin, Oral agents Blood sugar tested every day: Yes Tested : 2 Psychiatric Medical History: Negative for: Anorexia/bulimia; Confinement  Anxiety Immunizations Pneumococcal Vaccine: Received Pneumococcal Vaccination: Yes Received Pneumococcal Vaccination On or After 60th Birthday: No Implantable Devices No devices added Hospitalization / Surgery History Type of Hospitalization/Surgery Cholecystectomy pelvic mass ventral hernia/ hernia repair Hand surgery (right hand) Family and Social History Diabetes: Yes - Mother,Father; Stroke: Yes - Siblings; Never smoker; Marital Status - Single; Alcohol Use: Never; Drug Use: No History; Caffeine Use: Daily; Financial Concerns: No; Food, Clothing or Shelter Needs: No; Transportation Concerns: No Engineer, maintenance) Signed: 12/30/2021 1:59:41 PM By: Fredirick Maudlin MD FACS Signed: 01/03/2022 4:38:59 PM By: Blanche East RN Entered By: Fredirick Maudlin on 12/30/2021 13:27:52 -------------------------------------------------------------------------------- SuperBill Details Patient Name: Date of Service: [name] 12/30/2021 Medical Record Number: [id number] Patient Account Number: [id number] Date of Birth/Sex: Treating RN: [birth year] (46 y.o. Iver Nestle, [place] Primary Care Provider: [name] Other Clinician: Referring Provider: Treating Provider/Extender: [name] in Treatment: 2 Diagnosis Coding ICD-10 Codes Code Description [phone] Non-pressure chronic ulcer of left ankle with fat layer exposed E11.10 Type 2 diabetes mellitus with ketoacidosis without coma E11.622 Type 2 diabetes mellitus with other skin ulcer Facility Procedures CPT4 Code: [id number] Description: [phone] - DEBRIDE WOUND 1ST 20 SQ CM OR < ICD-10 Diagnosis Description L97.322 Non-pressure chronic ulcer of left ankle with fat layer exposed Modifier: Quantity: 1 Physician Procedures : CPT4 Code Description Modifier [id number] - WC PHYS LEVEL 3 - EST PT 25 ICD-10 Diagnosis Description L97.322 Non-pressure chronic ulcer of left ankle with fat layer exposed  E11.10 Type 2 diabetes mellitus with ketoacidosis without coma E11.622 Type  2 diabetes mellitus with other skin ulcer Quantity: 1 : [id number] - WC PHYS DEBR WO ANESTH 20 SQ CM ICD-10 Diagnosis Description L97.322 Non-pressure chronic ulcer of left ankle with fat layer exposed Quantity: 1 Electronic Signature(s) Signed: 12/30/2021 1:29:28 PM By: Fredirick Maudlin MD FACS Entered By: Fredirick Maudlin on 12/30/2021 13:29:28

## 2022-01-03 NOTE — Progress Notes (Signed)
[name] ([id number]) Visit Report for 12/30/2021 Arrival Information Details Patient Name: Date of Service: [name] 12/30/2021 1:00 PM Medical Record Number: [id number] Patient Account Number: [id number] Date of Birth/Sex: Treating RN: [birth year] (46 y.o. [name], [place] Primary Care Avon Mergenthaler: [name] Other Clinician: Referring Parthena Fergeson: Treating Allyn Bartelson/Extender: [name] in Treatment: 2 Visit Information History Since Last Visit All ordered tests and consults were completed: Yes Patient Arrived: Ambulatory Added or deleted any medications: No Arrival Time: 12:44 Any new allergies or adverse reactions: No Accompanied By: self Had a fall or experienced change in No Transfer Assistance: None activities of daily living that may affect Patient Requires Transmission-Based Precautions: No risk of falls: Patient Has Alerts: Yes Signs or symptoms of abuse/neglect since last visito No Patient Alerts: ABI LLE 1.22 Hospitalized since last visit: No Implantable device outside of the clinic excluding No cellular tissue based products placed in the center since last visit: Has Dressing in Place as Prescribed: Yes Pain Present Now: No Electronic Signature(s) Signed: 01/03/2022 4:38:59 PM By: Blanche East RN Entered By: Blanche East on 12/30/2021 12:44:34 -------------------------------------------------------------------------------- Encounter Discharge Information Details Patient Name: Date of Service: [name] [facility] 12/30/2021 1:00 PM Medical Record Number: [id number] Patient Account Number: [id number] Date of Birth/Sex: Treating RN: Mar 31, 1976 (46 y.o. [name] Primary Care Malique Driskill: [name] Other Clinician: Referring William Laske: Treating Roena Sassaman/Extender: [name] in Treatment: 2 Encounter Discharge Information Items Post Procedure Vitals Discharge Condition:  Stable Temperature (F): 98.9 Ambulatory Status: Ambulatory Pulse (bpm): 103 Discharge Destination: Home Respiratory Rate (breaths/min): 16 Transportation: Other Blood Pressure (mmHg): 108/64 Accompanied By: self Schedule Follow-up Appointment: Yes Clinical Summary of Care: Electronic Signature(s) Signed: 01/03/2022 4:38:59 PM By: Blanche East RN Entered By: Blanche East on 12/30/2021 13:19:30 -------------------------------------------------------------------------------- Lower Extremity Assessment Details Patient Name: Date of Service: [name] 12/30/2021 1:00 PM Medical Record Number: [id number] Patient Account Number: [id number] Date of Birth/Sex: Treating RN: 1975-09-25 (46 y.o. [name] Primary Care [name]: [name] Other Clinician: Referring [name]: Treating [name]/Extender: [name] in Treatment: 2 Edema Assessment Assessed: [Left: No] [Right: No] E[Left: dema] [Right: :] Calf Left: Right: Point of Measurement: From Medial Instep 30.5 cm Ankle Left: Right: Point of Measurement: From Medial Instep 16 cm Vascular Assessment Pulses: Dorsalis Pedis Palpable: [Left:Yes] Electronic Signature(s) Signed: 01/03/2022 4:38:59 PM By: Blanche East RN Entered By: Blanche East on 12/30/2021 12:53:43 -------------------------------------------------------------------------------- Multi Wound Chart Details Patient Name: Date of Service: [name] [facility] 12/30/2021 1:00 PM Medical Record Number: [id number] Patient Account Number: [id number] Date of Birth/Sex: Treating RN: 09-02-1975 (46 y.o. [name] Primary Care Renesme Church: [name] Other Clinician: Referring Ahmya Bernick: Treating [name]/Extender: [name] in Treatment: 2 Vital Signs Height(in): 58 Pulse(bpm): 103 Weight(lbs): 111 Blood Pressure(mmHg): 108/64 Body Mass Index(BMI): 23.2 Temperature(F):  98.9 Respiratory Rate(breaths/min): 16 Photos: [N/A:N/A] Left, Lateral Ankle N/A N/A Wound Location: Laceration N/A N/A Wounding Event: Abrasion N/A N/A Primary Etiology: Hypertension, Type II Diabetes N/A N/A Comorbid History: 10/24/2021 N/A N/A Date Acquired: 2 N/A N/A Weeks of Treatment: Open N/A N/A Wound Status: No N/A N/A Wound Recurrence: 1x1.2x0.3 N/A N/A Measurements L x W x D (cm) 0.942 N/A N/A A (cm) : rea 0.283 N/A N/A Volume (cm) : -42.70% N/A N/A % Reduction in A rea: -114.40% N/A N/A % Reduction in Volume: Full Thickness Without Exposed N/A N/A Classification: Support Structures Medium N/A N/A Exudate A  mount: Serosanguineous N/A N/A Exudate Type: red, brown N/A N/A Exudate Color: Flat and Intact N/A N/A Wound Margin: Medium (34-66%) N/A N/A Granulation A mount: Pink N/A N/A Granulation Quality: Medium (34-66%) N/A N/A Necrotic A mount: Fat Layer (Subcutaneous Tissue): Yes N/A N/A Exposed Structures: Fascia: No Tendon: No Muscle: No Joint: No Bone: No Medium (34-66%) N/A N/A Epithelialization: Debridement - Selective/Open Wound N/A N/A Debridement: Pre-procedure Verification/Time Out 12:59 N/A N/A Taken: Lidocaine 5% topical ointment N/A N/A Pain Control: Necrotic/Eschar, Other, Slough N/A N/A Tissue Debrided: Non-Viable Tissue N/A N/A Level: 1.2 N/A N/A Debridement A (sq cm): rea Curette, Forceps, Scissors N/A N/A Instrument: Minimum N/A N/A Bleeding: Pressure N/A N/A Hemostasis A chieved: 0 N/A N/A Procedural Pain: 0 N/A N/A Post Procedural Pain: Procedure was tolerated well N/A N/A Debridement Treatment Response: 1x1.2x0.3 N/A N/A Post Debridement Measurements L x W x D (cm) 0.283 N/A N/A Post Debridement Volume: (cm) Debridement N/A N/A Procedures Performed: Treatment Notes Wound #1 (Ankle) Wound Laterality: Left, Lateral Cleanser Soap and Water Discharge Instruction: May shower and wash wound with dial  antibacterial soap and water prior to dressing change. Peri-Wound Care Sween Lotion (Moisturizing lotion) Discharge Instruction: Apply moisturizing lotion as directed Topical Primary Dressing KerraCel Ag Gelling Fiber Dressing, 2x2 in (silver alginate) Discharge Instruction: Apply silver alginate to wound bed as instructed Secondary Dressing Bordered Gauze, 4x4 in Discharge Instruction: Apply over primary dressing as directed. Optifoam Non-Adhesive Dressing, 4x4 in Discharge Instruction: Apply over primary dressing as directed. Secured With [employer], 4.5x3.1 (in/yd) Discharge Instruction: Secure with Kerlix as directed. 52M Medipore Soft Cloth Surgical T 2x10 (in/yd) ape Discharge Instruction: Secure with tape as directed. Compression Wrap Compression Stockings Add-Ons Electronic Signature(s) Signed: 12/30/2021 1:24:05 PM By: Fredirick Maudlin MD FACS Signed: 01/03/2022 4:38:59 PM By: Blanche East RN Entered By: Fredirick Maudlin on 12/30/2021 13:24:05 -------------------------------------------------------------------------------- Multi-Disciplinary Care Plan Details Patient Name: Date of Service: [name] [facility] 12/30/2021 1:00 PM Medical Record Number: [id number] Patient Account Number: [id number] Date of Birth/Sex: Treating RN: [birth year] (46 y.o. [name] Primary Care Jaydan Chretien: [name] Other Clinician: Referring Antonette Hendricks: Treating Kadden Osterhout/Extender: [name] in Treatment: 2 Active Inactive Orientation to the Wound Care Program Nursing Diagnoses: Knowledge deficit related to the wound healing center program Goals: Patient/caregiver will verbalize understanding of the [place] Program Date Initiated: 12/11/2021 Target Resolution Date: 01/08/2022 Goal Status: Active Interventions: Provide education on orientation to the wound center Notes: Wound/Skin Impairment Nursing Diagnoses: Knowledge  deficit related to ulceration/compromised skin integrity Goals: Ulcer/skin breakdown will have a volume reduction of 30% by week 4 Date Initiated: 12/11/2021 Target Resolution Date: 01/08/2022 Goal Status: Active Interventions: Assess patient/caregiver ability to obtain necessary supplies Assess ulceration(s) every visit Treatment Activities: Skin care regimen initiated : 12/11/2021 Notes: Electronic Signature(s) Signed: 01/03/2022 4:38:59 PM By: Blanche East RN Entered By: Blanche East on 12/30/2021 12:45:26 -------------------------------------------------------------------------------- Pain Assessment Details Patient Name: Date of Service: [name] [facility] 12/30/2021 1:00 PM Medical Record Number: [id number] Patient Account Number: [id number] Date of Birth/Sex: Treating RN: December 09, 1975 (46 y.o. [name] Primary Care Miria Cappelli: [name] Other Clinician: Referring Humbert Morozov: Treating Shonda Mandarino/Extender: [name] in Treatment: 2 Active Problems Location of Pain Severity and Description of Pain Patient Has Paino No Site Locations Pain Management and Medication Current Pain Management: Electronic Signature(s) Signed: 01/03/2022 4:38:59 PM By: Blanche East RN Entered By: Blanche East on 12/30/2021 12:44:53 -------------------------------------------------------------------------------- Patient/Caregiver Education Details Patient Name: Date of  Service: [name] 9/25/2023andnbsp1:00 PM Medical Record Number: [id number] Patient Account Number: [id number] Date of Birth/Gender: Treating RN: September 14, 1975 (46 y.o. [name] Primary Care Physician: [name] Other Clinician: Referring Physician: Treating Physician/Extender: [name] in Treatment: 2 Education Assessment Education Provided To: Patient Education Topics Provided Wound/Skin Impairment: Methods:  Explain/Verbal Responses: Reinforcements needed, State content correctly Electronic Signature(s) Signed: 01/03/2022 4:38:59 PM By: Blanche East RN Entered By: Blanche East on 12/30/2021 12:45:39 -------------------------------------------------------------------------------- Wound Assessment Details Patient Name: Date of Service: [name] [facility] 12/30/2021 1:00 PM Medical Record Number: [id number] Patient Account Number: [id number] Date of Birth/Sex: Treating RN: April 09, 1975 (46 y.o. [name], [place] Primary Care Ko Bardon: [name] Other Clinician: Referring Lasheba Stevens: Treating Caressa Scearce/Extender: [name] in Treatment: 2 Wound Status Wound Number: 1 Primary Etiology: Abrasion Wound Location: Left, Lateral Ankle Wound Status: Open Wounding Event: Laceration Comorbid History: Hypertension, Type II Diabetes Date Acquired: 10/24/2021 Weeks Of Treatment: 2 Clustered Wound: No Photos Wound Measurements Length: (cm) 1 Width: (cm) 1.2 Depth: (cm) 0.3 Area: (cm) 0.942 Volume: (cm) 0.283 % Reduction in Area: -42.7% % Reduction in Volume: -114.4% Epithelialization: Medium (34-66%) Tunneling: No Undermining: No Wound Description Classification: Full Thickness Without Exposed Support Structures Wound Margin: Flat and Intact Exudate Amount: Medium Exudate Type: Serosanguineous Exudate Color: red, brown Foul Odor After Cleansing: No Slough/Fibrino Yes Wound Bed Granulation Amount: Medium (34-66%) Exposed Structure Granulation Quality: Pink Fascia Exposed: No Necrotic Amount: Medium (34-66%) Fat Layer (Subcutaneous Tissue) Exposed: Yes Necrotic Quality: Adherent Slough Tendon Exposed: No Muscle Exposed: No Joint Exposed: No Bone Exposed: No Treatment Notes Wound #1 (Ankle) Wound Laterality: Left, Lateral Cleanser Soap and Water Discharge Instruction: May shower and wash wound with dial antibacterial soap and water prior to  dressing change. Peri-Wound Care Sween Lotion (Moisturizing lotion) Discharge Instruction: Apply moisturizing lotion as directed Topical Primary Dressing KerraCel Ag Gelling Fiber Dressing, 2x2 in (silver alginate) Discharge Instruction: Apply silver alginate to wound bed as instructed Secondary Dressing Bordered Gauze, 4x4 in Discharge Instruction: Apply over primary dressing as directed. Optifoam Non-Adhesive Dressing, 4x4 in Discharge Instruction: Apply over primary dressing as directed. Secured With [employer], 4.5x3.1 (in/yd) Discharge Instruction: Secure with Kerlix as directed. 93M Medipore Soft Cloth Surgical T 2x10 (in/yd) ape Discharge Instruction: Secure with tape as directed. Compression Wrap Compression Stockings Add-Ons Electronic Signature(s) Signed: 01/03/2022 4:38:59 PM By: Blanche East RN Entered By: Blanche East on 12/30/2021 12:54:58 -------------------------------------------------------------------------------- Vitals Details Patient Name: Date of Service: [name] [facility] 12/30/2021 1:00 PM Medical Record Number: [id number] Patient Account Number: [id number] Date of Birth/Sex: Treating RN: 05-29-1975 (46 y.o. [name], [place] Primary Care Wynn Kernes: [name] Other Clinician: Referring Monita Swier: Treating Pat Sires/Extender: [name] in Treatment: 2 Vital Signs Time Taken: 12:44 Temperature (F): 98.9 Height (in): 58 Pulse (bpm): 103 Weight (lbs): 111 Respiratory Rate (breaths/min): 16 Body Mass Index (BMI): 23.2 Blood Pressure (mmHg): 108/64 Reference Range: 80 - 120 mg / dl Electronic Signature(s) Signed: 01/03/2022 4:38:59 PM By: Blanche East RN Entered By: Blanche East on 12/30/2021 13:19:19

## 2022-01-06 ENCOUNTER — Encounter (HOSPITAL_BASED_OUTPATIENT_CLINIC_OR_DEPARTMENT_OTHER): Payer: Medicaid Other | Attending: General Surgery | Admitting: General Surgery

## 2022-01-06 DIAGNOSIS — E1110 Type 2 diabetes mellitus with ketoacidosis without coma: Secondary | ICD-10-CM | POA: Insufficient documentation

## 2022-01-06 DIAGNOSIS — E11622 Type 2 diabetes mellitus with other skin ulcer: Secondary | ICD-10-CM | POA: Diagnosis present

## 2022-01-06 DIAGNOSIS — L97322 Non-pressure chronic ulcer of left ankle with fat layer exposed: Secondary | ICD-10-CM | POA: Diagnosis not present

## 2022-01-06 NOTE — Progress Notes (Signed)
[name], [name] ([id number]) Visit Report for 01/06/2022 Arrival Information Details Patient Name: Date of Service: [name] 01/06/2022 1:45 PM Medical Record Number: [id number] Patient Account Number: [id number] Date of Birth/Sex: Treating RN: 1976-02-17 (46 y.o. [name], [place] Primary Care Chayse Gracey: [name] Other Clinician: Referring Lakeith Careaga: Treating [name]/Extender: [name] in Treatment: 3 Visit Information History Since Last Visit All ordered tests and consults were completed: Yes Patient Arrived: Ambulatory Added or deleted any medications: No Arrival Time: 14:04 Any new allergies or adverse reactions: No Accompanied By: Self Had a fall or experienced change in No Transfer Assistance: None activities of daily living that may affect Patient Requires Transmission-Based Precautions: No risk of falls: Patient Has Alerts: Yes Signs or symptoms of abuse/neglect since last visito No Patient Alerts: ABI LLE 1.22 Hospitalized since last visit: No Implantable device outside of the clinic excluding No cellular tissue based products placed in the center since last visit: Has Dressing in Place as Prescribed: Yes Pain Present Now: No Electronic Signature(s) Signed: 01/06/2022 4:21:20 PM By: Tina East RN Entered By: [name] on 01/06/2022 14:04:31 -------------------------------------------------------------------------------- Encounter Discharge Information Details Patient Name: Date of Service: [name] 01/06/2022 1:45 PM Medical Record Number: [id number] Patient Account Number: [id number] Date of Birth/Sex: Treating RN: 18-May-1975 (46 y.o. [name] Primary Care Bynum Mccullars: [name] Other Clinician: Referring Aquiles Ruffini: Treating Tzivia Oneil/Extender: [name] in Treatment: 3 Encounter Discharge Information Items Post Procedure Vitals Discharge Condition:  Stable Temperature (F): 98.4 Ambulatory Status: Ambulatory Pulse (bpm): 102 Discharge Destination: Home Respiratory Rate (breaths/min): 16 Transportation: Other Blood Pressure (mmHg): 94/60 Accompanied By: self Schedule Follow-up Appointment: Yes Clinical Summary of Care: Electronic Signature(s) Signed: 01/06/2022 4:21:20 PM By: Tina East RN Entered By: [name] on 01/06/2022 14:29:34 -------------------------------------------------------------------------------- Lower Extremity Assessment Details Patient Name: Date of Service: [name] 01/06/2022 1:45 PM Medical Record Number: [id number] Patient Account Number: [id number] Date of Birth/Sex: Treating RN: 12/14/1975 (46 y.o. [name] Primary Care Awa Bachicha: [name] Other Clinician: Referring Marielys Trinidad: Treating [name]/Extender: [name] in Treatment: 3 Edema Assessment Assessed: [Left: No] [Right: No] E[Left: dema] [Right: :] Calf Left: Right: Point of Measurement: From Medial Instep 30.8 cm Ankle Left: Right: Point of Measurement: From Medial Instep 16.2 cm Vascular Assessment Pulses: Dorsalis Pedis Palpable: [Left:Yes] Electronic Signature(s) Signed: 01/06/2022 4:21:20 PM By: Tina East RN Entered By: [name] on 01/06/2022 14:07:35 -------------------------------------------------------------------------------- Multi Wound Chart Details Patient Name: Date of Service: [name] 01/06/2022 1:45 PM Medical Record Number: [id number] Patient Account Number: [id number] Date of Birth/Sex: Treating RN: 1975/04/14 (46 y.o. F) Primary Care Myan Locatelli: [name] Other Clinician: Referring Chrles Selley: Treating [name]/Extender: [name] in Treatment: 3 Vital Signs Height(in): 58 Pulse(bpm): 102 Weight(lbs): 111 Blood Pressure(mmHg): 94/60 Body Mass Index(BMI): 23.2 Temperature(F):  98.4 Respiratory Rate(breaths/min): 16 Photos: [N/A:N/A] Left, Lateral Ankle N/A N/A Wound Location: Laceration N/A N/A Wounding Event: Abrasion N/A N/A Primary Etiology: Hypertension, Type II Diabetes N/A N/A Comorbid History: 10/24/2021 N/A N/A Date Acquired: 3 N/A N/A Weeks of Treatment: Open N/A N/A Wound Status: No N/A N/A Wound Recurrence: 1.7x1.4x0.2 N/A N/A Measurements L x W x D (cm) 1.869 N/A N/A A (cm) : rea 0.374 N/A N/A Volume (cm) : -183.20% N/A N/A % Reduction in A rea: -183.30% N/A N/A % Reduction in Volume: Full Thickness Without Exposed N/A N/A Classification: Support Structures Medium N/A N/A Exudate Amount: Serosanguineous N/A  N/A Exudate Type: red, brown N/A N/A Exudate Color: Fat Layer (Subcutaneous Tissue): Yes N/A N/A Exposed Structures: Fascia: No Tendon: No Muscle: No Joint: No Bone: No Debridement - Selective/Open Wound N/A N/A Debridement: Pre-procedure Verification/Time Out 14:14 N/A N/A Taken: Lidocaine 5% topical ointment N/A N/A Pain Control: Necrotic/Eschar N/A N/A Tissue Debrided: Non-Viable Tissue N/A N/A Level: 2.38 N/A N/A Debridement A (sq cm): rea Curette N/A N/A Instrument: Minimum N/A N/A Bleeding: Pressure N/A N/A Hemostasis A chieved: 0 N/A N/A Procedural Pain: 0 N/A N/A Post Procedural Pain: Procedure was tolerated well N/A N/A Debridement Treatment Response: 1.7x1.4x0.2 N/A N/A Post Debridement Measurements L x W x D (cm) 0.374 N/A N/A Post Debridement Volume: (cm) Excoriation: Yes N/A N/A Periwound Skin Texture: Induration: No Callus: No Crepitus: No Rash: No Scarring: No Maceration: Yes N/A N/A Periwound Skin Moisture: Dry/Scaly: No Cool/Cold N/A N/A Temperature: Debridement N/A N/A Procedures Performed: Treatment Notes Wound #1 (Ankle) Wound Laterality: Left, Lateral Cleanser Soap and Water Discharge Instruction: May shower and wash wound with dial antibacterial soap and  water prior to dressing change. Peri-Wound Care Sween Lotion (Moisturizing lotion) Discharge Instruction: Apply moisturizing lotion as directed Topical Primary Dressing KerraCel Ag Gelling Fiber Dressing, 2x2 in (silver alginate) Discharge Instruction: Apply silver alginate to wound bed as instructed Secondary Dressing Bordered Gauze, 4x4 in Discharge Instruction: Apply over primary dressing as directed. Optifoam Non-Adhesive Dressing, 4x4 in Discharge Instruction: Apply over primary dressing as directed. Secured With [employer], 4.5x3.1 (in/yd) Discharge Instruction: Secure with Kerlix as directed. 1M Medipore Soft Cloth Surgical T 2x10 (in/yd) ape Discharge Instruction: Secure with tape as directed. Compression Wrap Compression Stockings Add-Ons Electronic Signature(s) Signed: 01/06/2022 2:49:21 PM By: Fredirick Maudlin MD FACS Entered By: Fredirick Maudlin on 01/06/2022 14:49:21 -------------------------------------------------------------------------------- Multi-Disciplinary Care Plan Details Patient Name: Date of Service: Tina Demark [facility] Church 01/06/2022 1:45 PM Medical Record Number: [id number] Patient Account Number: [id number] Date of Birth/Sex: Treating RN: [birth year] (46 y.o. [name] Primary Care Jamiracle Avants: [name] Other Clinician: Referring Kevin Space: Treating Tyronza Happe/Extender: [name] in Treatment: 3 Active Inactive Orientation to the Wound Care Program Nursing Diagnoses: Knowledge deficit related to the wound healing center program Goals: Patient/caregiver will verbalize understanding of the [place] Program Date Initiated: 12/11/2021 Target Resolution Date: 01/08/2022 Goal Status: Active Interventions: Provide education on orientation to the wound center Notes: Wound/Skin Impairment Nursing Diagnoses: Knowledge deficit related to ulceration/compromised skin integrity Goals: Ulcer/skin  breakdown will have a volume reduction of 30% by week 4 Date Initiated: 12/11/2021 Date Inactivated: 01/06/2022 Target Resolution Date: 01/08/2022 Goal Status: Met Ulcer/skin breakdown will have a volume reduction of 50% by week 8 Date Initiated: 01/06/2022 Target Resolution Date: 02/10/2022 Goal Status: Active Interventions: Assess patient/caregiver ability to obtain necessary supplies Assess ulceration(s) every visit Treatment Activities: Skin care regimen initiated : 12/11/2021 Notes: Electronic Signature(s) Signed: 01/06/2022 4:21:20 PM By: Tina East RN Entered By: [name] on 01/06/2022 14:09:50 -------------------------------------------------------------------------------- Pain Assessment Details Patient Name: Date of Service: [name] 01/06/2022 1:45 PM Medical Record Number: [id number] Patient Account Number: [id number] Date of Birth/Sex: Treating RN: [birth year] (46 y.o. [name] Primary Care Sloka Volante: [name] Other Clinician: Referring Bertine Schlottman: Treating Zylan Almquist/Extender: [name] in Treatment: 3 Active Problems Location of Pain Severity and Description of Pain Patient Has Paino No Site Locations Pain Management and Medication Current Pain Management: Electronic Signature(s) Signed: 01/06/2022 4:21:20 PM By: Tina East RN Entered By: [name] on 01/06/2022  14:05:05 -------------------------------------------------------------------------------- Patient/Caregiver Education Details Patient Name: Date of Service: [name] 10/2/2023andnbsp1:45 PM Medical Record Number: [id number] Patient Account Number: [id number] Date of Birth/Gender: Treating RN: [birth year] (46 y.o. [name] Primary Care Physician: [name] Other Clinician: Referring Physician: Treating Physician/Extender: [name] in Treatment: 3 Education Assessment Education  Provided To: Patient Education Topics Provided Wound/Skin Impairment: Methods: Explain/Verbal Responses: Reinforcements needed, State content correctly Electronic Signature(s) Signed: 01/06/2022 4:21:20 PM By: Tina East RN Entered By: [name] on 01/06/2022 14:14:18 -------------------------------------------------------------------------------- Wound Assessment Details Patient Name: Date of Service: [name] 01/06/2022 1:45 PM Medical Record Number: [id number] Patient Account Number: [id number] Date of Birth/Sex: Treating RN: 1975/10/22 (46 y.o. [name], [name] Primary Care Chong Wojdyla: [name] Other Clinician: Referring Dinisha Cai: Treating Edris Schneck/Extender: [name] in Treatment: 3 Wound Status Wound Number: 1 Primary Etiology: Abrasion Wound Location: Left, Lateral Ankle Wound Status: Open Wounding Event: Laceration Comorbid History: Hypertension, Type II Diabetes Date Acquired: 10/24/2021 Weeks Of Treatment: 3 Clustered Wound: No Photos Wound Measurements Length: (cm) 1.7 Width: (cm) 1.4 Depth: (cm) 0.2 Area: (cm) 1.869 Volume: (cm) 0.374 % Reduction in Area: -183.2% % Reduction in Volume: -183.3% Tunneling: No Undermining: No Wound Description Classification: Full Thickness Without Exposed Support Structu Exudate Amount: Medium Exudate Type: Serosanguineous Exudate Color: red, brown res Wound Bed Exposed Structure Fascia Exposed: No Fat Layer (Subcutaneous Tissue) Exposed: Yes Tendon Exposed: No Muscle Exposed: No Joint Exposed: No Bone Exposed: No Periwound Skin Texture Texture Color No Abnormalities Noted: No No Abnormalities Noted: No Callus: No Temperature / Pain Crepitus: No Temperature: Cool/Cold Excoriation: Yes Induration: No Rash: No Scarring: No Moisture No Abnormalities Noted: No Dry / Scaly: No Maceration: Yes Treatment Notes Wound #1 (Ankle) Wound Laterality: Left,  Lateral Cleanser Soap and Water Discharge Instruction: May shower and wash wound with dial antibacterial soap and water prior to dressing change. Peri-Wound Care Sween Lotion (Moisturizing lotion) Discharge Instruction: Apply moisturizing lotion as directed Topical Primary Dressing KerraCel Ag Gelling Fiber Dressing, 2x2 in (silver alginate) Discharge Instruction: Apply silver alginate to wound bed as instructed Secondary Dressing Bordered Gauze, 4x4 in Discharge Instruction: Apply over primary dressing as directed. Optifoam Non-Adhesive Dressing, 4x4 in Discharge Instruction: Apply over primary dressing as directed. Secured With [employer], 4.5x3.1 (in/yd) Discharge Instruction: Secure with Kerlix as directed. 67M Medipore Soft Cloth Surgical T 2x10 (in/yd) ape Discharge Instruction: Secure with tape as directed. Compression Wrap Compression Stockings Add-Ons Electronic Signature(s) Signed: 01/06/2022 4:21:20 PM By: Tina East RN Entered By: [name] on 01/06/2022 14:11:56 -------------------------------------------------------------------------------- Vitals Details Patient Name: Date of Service: Tina Demark [facility] Church 01/06/2022 1:45 PM Medical Record Number: [id number] Patient Account Number: [id number] Date of Birth/Sex: Treating RN: 10-26-1975 (46 y.o. [name], [name] Primary Care Jaquilla Woodroof: [name] Other Clinician: Referring Orilla Templeman: Treating Alfred Harrel/Extender: [name] in Treatment: 3 Vital Signs Time Taken: 14:00 Temperature (F): 98.4 Height (in): 58 Pulse (bpm): 102 Weight (lbs): 111 Respiratory Rate (breaths/min): 16 Body Mass Index (BMI): 23.2 Blood Pressure (mmHg): 94/60 Reference Range: 80 - 120 mg / dl Electronic Signature(s) Signed: 01/06/2022 4:21:20 PM By: Tina East RN Signed: 01/06/2022 4:21:20 PM By: Tina East RN Entered By: [name] on 01/06/2022 14:05:01

## 2022-01-06 NOTE — Progress Notes (Signed)
[name], [name] ([id number]) Visit Report for 01/06/2022 Chief Complaint Document Details Patient Name: Date of Service: [name] 01/06/2022 1:45 PM Medical Record Number: [id number] Patient Account Number: [id number] Date of Birth/Sex: Treating RN: [birth year] (46 y.o. F) Primary Care Provider: [name] Other Clinician: Referring Provider: Treating Provider/Extender: [name] in Treatment: 3 Information Obtained from: Patient Chief Complaint Patient seen for complaints of Non-Healing Wound. Electronic Signature(s) Signed: 01/06/2022 2:49:26 PM By: Fredirick Maudlin MD FACS Entered By: Fredirick Maudlin on 01/06/2022 14:49:26 -------------------------------------------------------------------------------- Debridement Details Patient Name: Date of Service: [name] 01/06/2022 1:45 PM Medical Record Number: [id number] Patient Account Number: [id number] Date of Birth/Sex: Treating RN: 1975/08/20 (46 y.o. Iver Nestle, Anderson Primary Care Provider: [name] Other Clinician: Referring Provider: Treating Provider/Extender: [name] in Treatment: 3 Debridement Performed for Assessment: Wound #1 Left,Lateral Ankle Performed By: Physician Fredirick Maudlin, MD Debridement Type: Debridement Level of Consciousness (Pre-procedure): Awake and Alert Pre-procedure Verification/Time Out Yes - 14:14 Taken: Start Time: 14:15 Pain Control: Lidocaine 5% topical ointment T Area Debrided (L x W): otal 1.7 (cm) x 1.4 (cm) = 2.38 (cm) Tissue and other material debrided: Non-Viable, Eschar Level: Non-Viable Tissue Debridement Description: Selective/Open Wound Instrument: Curette Bleeding: Minimum Hemostasis Achieved: Pressure Procedural Pain: 0 Post Procedural Pain: 0 Response to Treatment: Procedure was tolerated well Level of Consciousness (Post- Awake and Alert procedure): Post Debridement  Measurements of Total Wound Length: (cm) 1.7 Width: (cm) 1.4 Depth: (cm) 0.2 Volume: (cm) 0.374 Character of Wound/Ulcer Post Debridement: Requires Further Debridement Post Procedure Diagnosis Same as Pre-procedure Notes Scribed for Dr. Celine Ahr by Blanche East, RN Electronic Signature(s) Signed: 01/06/2022 3:28:59 PM By: Fredirick Maudlin MD FACS Signed: 01/06/2022 4:21:20 PM By: Blanche East RN Entered By: Blanche East on 01/06/2022 14:16:34 -------------------------------------------------------------------------------- HPI Details Patient Name: Date of Service: [name] UELINE 01/06/2022 1:45 PM Medical Record Number: [id number] Patient Account Number: [id number] Date of Birth/Sex: Treating RN: 1975/07/15 (46 y.o. F) Primary Care Provider: [name] Other Clinician: Referring Provider: Treating Provider/Extender: [name] in Treatment: 3 History of Present Illness HPI Description: ADMISSION 12/11/2021 History is somewhat limited secondary to intellectual delay. Some of the history has been garnered from review of the electronic medical record. The patient reports that she was sitting on pavement with her left foot tucked underneath her. She says that she developed a wound on that site. She saw her PCP. They note from that clinic date (11/13/2021: Says that the wound was open but dried without any drainage. Left foot edema was described. A 7-day course of Keflex was prescribed. She is followed by podiatry for routine diabetic foot care. When she saw them on August 15, the provider referred her to a different podiatrist who debrided the wound. He applied Iodosorb and prescribed Silvadene daily dressing changes. The patient states that she has not been changing her dressing. She apparently resides in some sort of facility and she says there are nurses and doctors there. Specific details are difficult to sort out. She denies any significant pain to  the wound. On her left lateral malleolus, there is a circular wound that does seem consistent with the history provided. There is some slough buildup in the center of the wound. No fluctuance, odor, or purulent drainage. 12/16/2021: The wound is about the same size today but the surface is quite a bit cleaner. 12/23/2021: The wound continues to contract. Has developed some undermining but this appears  to be secondary to some perimeter eschar. No concern for infection. 12/30/2021: The area of undermining has broken down and she now has 3 small areas that are open. They all connect under the thin bands of skin. Her drainage has decreased. There is a little bit of slough and eschar accumulation. 01/06/2022: The wound looks a bit shallower today; I removed the skin that was causing the undermining at her last visit with me. There is a little bit of slough overlying good granulation tissue at the more distal aspect of the wound. The more proximal aspect is a bit pale and fibrous. Electronic Signature(s) Signed: 01/06/2022 2:51:16 PM By: Fredirick Maudlin MD FACS Entered By: Fredirick Maudlin on 01/06/2022 14:51:16 -------------------------------------------------------------------------------- Physical Exam Details Patient Name: Date of Service: [name] 01/06/2022 1:45 PM Medical Record Number: [id number] Patient Account Number: [id number] Date of Birth/Sex: Treating RN: 20-Jan-1976 (46 y.o. F) Primary Care Provider: [name] Other Clinician: Referring Provider: Treating Provider/Extender: [name] in Treatment: 3 Constitutional Within normal range for this patient. Slightly tachycardic, asymptomatic. . . No acute distress.Marland Kitchen Respiratory Normal work of breathing on room air.. Notes 01/06/2022: The wound looks a bit shallower today; I removed the skin that was causing the undermining at her last visit with me. There is a little bit of slough overlying  good granulation tissue at the more distal aspect of the wound. The more proximal aspect is a bit pale and fibrous. Electronic Signature(s) Signed: 01/06/2022 2:51:56 PM By: Fredirick Maudlin MD FACS Entered By: Fredirick Maudlin on 01/06/2022 14:51:56 -------------------------------------------------------------------------------- Physician Orders Details Patient Name: Date of Service: [name] 01/06/2022 1:45 PM Medical Record Number: [id number] Patient Account Number: [id number] Date of Birth/Sex: Treating RN: Jan 01, 1976 (46 y.o. Iver Nestle, Mariano Colon Primary Care Provider: [name] Other Clinician: Referring Provider: Treating Provider/Extender: [name] in Treatment: 3 Verbal / Phone Orders: No Diagnosis Coding ICD-10 Coding Code Description [phone] Non-pressure chronic ulcer of left ankle with fat layer exposed E11.10 Type 2 diabetes mellitus with ketoacidosis without coma E11.622 Type 2 diabetes mellitus with other skin ulcer Follow-up Appointments ppointment in 1 week. - Dr. Celine Ahr rm 2 Return A Tuesday 01/14/22 at 12:45 PM Nurse Visit: - Rm 2 Friday 01/10/22 at 11:30 AM Anesthetic Wound #1 Left,Lateral Ankle (In clinic) Topical Lidocaine 5% applied to wound bed - in clinic, prior to debridement Bathing/ Shower/ Hygiene May shower and wash wound with soap and water. - on days when the dressing is changed Wound Treatment Wound #1 - Ankle Wound Laterality: Left, Lateral Cleanser: Soap and Water Discharge Instructions: May shower and wash wound with dial antibacterial soap and water prior to dressing change. Peri-Wound Care: Sween Lotion (Moisturizing lotion) Discharge Instructions: Apply moisturizing lotion as directed Prim Dressing: KerraCel Ag Gelling Fiber Dressing, 2x2 in (silver alginate) ary Discharge Instructions: Apply silver alginate to wound bed as instructed Secondary Dressing: Bordered Gauze, 4x4 in Discharge  Instructions: Apply over primary dressing as directed. Secondary Dressing: Optifoam Non-Adhesive Dressing, 4x4 in Discharge Instructions: Apply over primary dressing as directed. Secured With: [employer], 4.5x3.1 (in/yd) Discharge Instructions: Secure with Kerlix as directed. Secured With: 28M Medipore Public affairs consultant Surgical T 2x10 (in/yd) ape Discharge Instructions: Secure with tape as directed. Electronic Signature(s) Signed: 01/06/2022 2:52:50 PM By: Fredirick Maudlin MD FACS Entered By: Fredirick Maudlin on 01/06/2022 14:52:50 -------------------------------------------------------------------------------- Problem List Details Patient Name: Date of Service: [name] 01/06/2022 1:45 PM Medical Record Number: [id number]  Patient Account Number: [id number] Date of Birth/Sex: Treating RN: [birth year] (46 y.o. [name] Primary Care Provider: [name] Other Clinician: Referring Provider: Treating Provider/Extender: [name] in Treatment: 3 Active Problems ICD-10 Encounter Code Description Active Date MDM Diagnosis [phone] Non-pressure chronic ulcer of left ankle with fat layer exposed 12/11/2021 No Yes E11.10 Type 2 diabetes mellitus with ketoacidosis without coma 12/11/2021 No Yes E11.622 Type 2 diabetes mellitus with other skin ulcer 12/11/2021 No Yes Inactive Problems Resolved Problems Electronic Signature(s) Signed: 01/06/2022 2:49:14 PM By: Fredirick Maudlin MD FACS Previous Signature: 01/06/2022 2:31:03 PM Version By: Fredirick Maudlin MD FACS Entered By: Fredirick Maudlin on 01/06/2022 14:49:14 -------------------------------------------------------------------------------- Progress Note Details Patient Name: Date of Service: [name] 01/06/2022 1:45 PM Medical Record Number: [id number] Patient Account Number: [id number] Date of Birth/Sex: Treating RN: August 05, 1975 (46 y.o. F) Primary Care Provider: [name] Other Clinician: Referring Provider: Treating Provider/Extender: [name] in Treatment: 3 Subjective Chief Complaint Information obtained from Patient Patient seen for complaints of Non-Healing Wound. History of Present Illness (HPI) ADMISSION 12/11/2021 History is somewhat limited secondary to intellectual delay. Some of the history has been garnered from review of the electronic medical record. The patient reports that she was sitting on pavement with her left foot tucked underneath her. She says that she developed a wound on that site. She saw her PCP. They note from that clinic date (11/13/2021: Says that the wound was open but dried without any drainage. Left foot edema was described. A 7-day course of Keflex was prescribed. She is followed by podiatry for routine diabetic foot care. When she saw them on August 15, the provider referred her to a different podiatrist who debrided the wound. He applied Iodosorb and prescribed Silvadene daily dressing changes. The patient states that she has not been changing her dressing. She apparently resides in some sort of facility and she says there are nurses and doctors there. Specific details are difficult to sort out. She denies any significant pain to the wound. On her left lateral malleolus, there is a circular wound that does seem consistent with the history provided. There is some slough buildup in the center of the wound. No fluctuance, odor, or purulent drainage. 12/16/2021: The wound is about the same size today but the surface is quite a bit cleaner. 12/23/2021: The wound continues to contract. Has developed some undermining but this appears to be secondary to some perimeter eschar. No concern for infection. 12/30/2021: The area of undermining has broken down and she now has 3 small areas that are open. They all connect under the thin bands of skin. Her drainage has decreased. There is a little bit of  slough and eschar accumulation. 01/06/2022: The wound looks a bit shallower today; I removed the skin that was causing the undermining at her last visit with me. There is a little bit of slough overlying good granulation tissue at the more distal aspect of the wound. The more proximal aspect is a bit pale and fibrous. Patient History Unable to Obtain Patient History due to Altered Mental Status. Information obtained from Patient. Family History Diabetes - Mother,Father, Stroke - Siblings. Social History Never smoker, Marital Status - Single, Alcohol Use - Never, Drug Use - No History, Caffeine Use - Daily. Medical History Cardiovascular Patient has history of Hypertension Endocrine Patient has history of Type II Diabetes Psychiatric Denies history of Anorexia/bulimia, Confinement Anxiety Hospitalization/Surgery History - Cholecystectomy. - pelvic mass. - ventral  hernia/ hernia repair. - Hand surgery (right hand). Medical A Surgical History Notes nd Gastrointestinal GERD Endocrine DKA Giant ovarian mass Objective Constitutional Within normal range for this patient. Slightly tachycardic, asymptomatic. No acute distress.. Vitals Time Taken: 2:00 PM, Height: 58 in, Weight: 111 lbs, BMI: 23.2, Temperature: 98.4 F, Pulse: 102 bpm, Respiratory Rate: 16 breaths/min, Blood Pressure: 94/60 mmHg. Respiratory Normal work of breathing on room air.. General Notes: 01/06/2022: The wound looks a bit shallower today; I removed the skin that was causing the undermining at her last visit with me. There is a little bit of slough overlying good granulation tissue at the more distal aspect of the wound. The more proximal aspect is a bit pale and fibrous. Integumentary (Hair, Skin) Wound #1 status is Open. Original cause of wound was Laceration. The date acquired was: 10/24/2021. The wound has been in treatment 3 weeks. The wound is located on the Left,Lateral Ankle. The wound measures 1.7cm length x  1.4cm width x 0.2cm depth; 1.869cm^2 area and 0.374cm^3 volume. There is Fat Layer (Subcutaneous Tissue) exposed. There is no tunneling or undermining noted. There is a medium amount of serosanguineous drainage noted. The periwound skin appearance exhibited: Excoriation, Maceration. The periwound skin appearance did not exhibit: Callus, Crepitus, Induration, Rash, Scarring, Dry/Scaly. Periwound temperature was noted as Cool/Cold. Assessment Active Problems ICD-10 Non-pressure chronic ulcer of left ankle with fat layer exposed Type 2 diabetes mellitus with ketoacidosis without coma Type 2 diabetes mellitus with other skin ulcer Procedures Wound #1 Pre-procedure diagnosis of Wound #1 is an Abrasion located on the Left,Lateral Ankle . There was a Selective/Open Wound Non-Viable Tissue Debridement with a total area of 2.38 sq cm performed by Fredirick Maudlin, MD. With the following instrument(s): Curette to remove Non-Viable tissue/material. Material removed includes Eschar after achieving pain control using Lidocaine 5% topical ointment. No specimens were taken. A time out was conducted at 14:14, prior to the start of the procedure. A Minimum amount of bleeding was controlled with Pressure. The procedure was tolerated well with a pain level of 0 throughout and a pain level of 0 following the procedure. Post Debridement Measurements: 1.7cm length x 1.4cm width x 0.2cm depth; 0.374cm^3 volume. Character of Wound/Ulcer Post Debridement requires further debridement. Post procedure Diagnosis Wound #1: Same as Pre-Procedure General Notes: Scribed for Dr. Celine Ahr by Blanche East, RN. Plan Follow-up Appointments: Return Appointment in 1 week. - Dr. Celine Ahr rm 2 Tuesday 01/14/22 at 12:45 PM Nurse Visit: - Rm 2 Friday 01/10/22 at 11:30 AM Anesthetic: Wound #1 Left,Lateral Ankle: (In clinic) Topical Lidocaine 5% applied to wound bed - in clinic, prior to debridement Bathing/ Shower/ Hygiene: May shower  and wash wound with soap and water. - on days when the dressing is changed WOUND #1: - Ankle Wound Laterality: Left, Lateral Cleanser: Soap and Water Discharge Instructions: May shower and wash wound with dial antibacterial soap and water prior to dressing change. Peri-Wound Care: Sween Lotion (Moisturizing lotion) Discharge Instructions: Apply moisturizing lotion as directed Prim Dressing: KerraCel Ag Gelling Fiber Dressing, 2x2 in (silver alginate) ary Discharge Instructions: Apply silver alginate to wound bed as instructed Secondary Dressing: Bordered Gauze, 4x4 in Discharge Instructions: Apply over primary dressing as directed. Secondary Dressing: Optifoam Non-Adhesive Dressing, 4x4 in Discharge Instructions: Apply over primary dressing as directed. Secured With: [employer], 4.5x3.1 (in/yd) Discharge Instructions: Secure with Kerlix as directed. Secured With: 74M Medipore Public affairs consultant Surgical T 2x10 (in/yd) ape Discharge Instructions: Secure with tape as directed. 01/06/2022: The wound looks  a bit shallower today; I removed the skin that was causing the undermining at her last visit with me. There is a little bit of slough overlying good granulation tissue at the more distal aspect of the wound. The more proximal aspect is a bit pale and fibrous. I used a curette to debride slough from her wound. We will continue to use silver alginate with a foam border dressing. She will have a nurse visit on Friday and then follow-up with me in 1 week's time. Electronic Signature(s) Signed: 01/06/2022 2:53:13 PM By: Fredirick Maudlin MD FACS Entered By: Fredirick Maudlin on 01/06/2022 14:53:13 -------------------------------------------------------------------------------- HxROS Details Patient Name: Date of Service: [name] 01/06/2022 1:45 PM Medical Record Number: [id number] Patient Account Number: [id number] Date of Birth/Sex: Treating RN: 07-06-1975 (46 y.o. F) Primary Care  Provider: [name] Other Clinician: Referring Provider: Treating Provider/Extender: [name] in Treatment: 3 Unable to Obtain Patient History due to Altered Mental Status Information Obtained From Patient Cardiovascular Medical History: Positive for: Hypertension Gastrointestinal Medical History: Past Medical History Notes: GERD Endocrine Medical History: Positive for: Type II Diabetes Past Medical History Notes: DKA Giant ovarian mass Treated with: Insulin, Oral agents Blood sugar tested every day: Yes Tested : 2 Psychiatric Medical History: Negative for: Anorexia/bulimia; Confinement Anxiety Immunizations Pneumococcal Vaccine: Received Pneumococcal Vaccination: Yes Received Pneumococcal Vaccination On or After 60th Birthday: No Implantable Devices No devices added Hospitalization / Surgery History Type of Hospitalization/Surgery Cholecystectomy pelvic mass ventral hernia/ hernia repair Hand surgery (right hand) Family and Social History Diabetes: Yes - Mother,Father; Stroke: Yes - Siblings; Never smoker; Marital Status - Single; Alcohol Use: Never; Drug Use: No History; Caffeine Use: Daily; Financial Concerns: No; Food, Clothing or Shelter Needs: No; Transportation Concerns: No Engineer, maintenance) Signed: 01/06/2022 3:28:59 PM By: Fredirick Maudlin MD FACS Entered By: Fredirick Maudlin on 01/06/2022 14:51:23 -------------------------------------------------------------------------------- SuperBill Details Patient Name: Date of Service: [name] 01/06/2022 Medical Record Number: [id number] Patient Account Number: [id number] Date of Birth/Sex: Treating RN: 12-14-1975 (46 y.o. F) Primary Care Provider: [name] Other Clinician: Referring Provider: Treating Provider/Extender: [name] in Treatment: 3 Diagnosis Coding ICD-10 Codes Code Description [phone] Non-pressure  chronic ulcer of left ankle with fat layer exposed E11.10 Type 2 diabetes mellitus with ketoacidosis without coma E11.622 Type 2 diabetes mellitus with other skin ulcer Facility Procedures CPT4 Code: [id number] Description: 66063 - DEBRIDE WOUND 1ST 20 SQ CM OR < ICD-10 Diagnosis Description L97.322 Non-pressure chronic ulcer of left ankle with fat layer exposed Modifier: Quantity: 1 Physician Procedures : CPT4 Code Description Modifier [id number] 32355 - WC PHYS LEVEL 3 - EST PT 25 ICD-10 Diagnosis Description L97.322 Non-pressure chronic ulcer of left ankle with fat layer exposed E11.622 Type 2 diabetes mellitus with other skin ulcer Quantity: 1 : [id number] - WC PHYS DEBR WO ANESTH 20 SQ CM ICD-10 Diagnosis Description L97.322 Non-pressure chronic ulcer of left ankle with fat layer exposed Quantity: 1 Electronic Signature(s) Signed: 01/06/2022 2:54:16 PM By: Fredirick Maudlin MD FACS Entered By: Fredirick Maudlin on 01/06/2022 14:54:16

## 2022-01-06 NOTE — Progress Notes (Signed)
[name], [name] ([id number]) Visit Report for 01/03/2022 Arrival Information Details Patient Name: Date of Service: [name] 01/03/2022 12:00 Church Medical Record Number: [id number] Patient Account Number: [id number] Date of Birth/Sex: Treating Church: 1975/08/17 (46 y.o. [name] Primary Care [name]: [name]: Referring [name]: Treating [name]/Extender: [name] in Treatment: 3 Visit Information History Since Last Visit Added or deleted any medications: No Patient Arrived: Ambulatory Any new allergies or adverse reactions: No Arrival Time: 12:02 Had a fall or experienced change in No Accompanied By: self activities of daily living that may affect Transfer Assistance: None risk of falls: Patient Identification Verified: Yes Signs or symptoms of abuse/neglect since last visito No Secondary Verification Process Completed: Yes Hospitalized since last visit: No Patient Requires Transmission-Based Precautions: No Implantable device outside of the clinic excluding No Patient Has Alerts: Yes cellular tissue based products placed in the center Patient Alerts: ABI LLE 1.22 since last visit: Has Dressing in Place as Prescribed: Yes Pain Present Now: No Electronic Signature(s) Signed: 01/06/2022 4:12:00 Church By: [name] Entered By: [name] on 01/03/2022 12:02:57 -------------------------------------------------------------------------------- Clinic Level of Care Assessment Details Patient Name: Date of Service: [name] 01/03/2022 12:00 Church Medical Record Number: [id number] Patient Account Number: [id number] Date of Birth/Sex: Treating Church: 02-25-1976 (46 y.o. [name], [name] Primary Care [name]: [name]: Referring [name]: Treating [name]/Extender: [name] in Treatment: 3 Clinic Level of Care Assessment  Items TOOL 4 Quantity Score X- 1 0 Use when only an EandM is performed on FOLLOW-UP visit ASSESSMENTS - Nursing Assessment / Reassessment X- 1 10 Reassessment of Co-morbidities (includes updates in patient status) X- 1 5 Reassessment of Adherence to Treatment Plan ASSESSMENTS - Wound and Skin A ssessment / Reassessment X - Simple Wound Assessment / Reassessment - one wound 1 5 '[]'$  - 0 Complex Wound Assessment / Reassessment - multiple wounds X- 1 10 Dermatologic / Skin Assessment (not related to wound area) ASSESSMENTS - Focused Assessment X- 1 5 Circumferential Edema Measurements - multi extremities '[]'$  - 0 Nutritional Assessment / Counseling / Intervention '[]'$  - 0 Lower Extremity Assessment (monofilament, tuning fork, pulses) '[]'$  - 0 Peripheral Arterial Disease Assessment (using hand held doppler) ASSESSMENTS - Ostomy and/or Continence Assessment and Care '[]'$  - 0 Incontinence Assessment and Management '[]'$  - 0 Ostomy Care Assessment and Management (repouching, etc.) PROCESS - Coordination of Care X - Simple Patient / Family Education for ongoing care 1 15 '[]'$  - 0 Complex (extensive) Patient / Family Education for ongoing care X- 1 10 Staff obtains Programmer, systems, Records, T Results / Process Orders est '[]'$  - 0 Staff telephones HHA, Nursing Homes / Clarify orders / etc '[]'$  - 0 Routine Transfer to another Facility (non-emergent condition) '[]'$  - 0 Routine Hospital Admission (non-emergent condition) '[]'$  - 0 New Admissions / Biomedical engineer / Ordering NPWT Apligraf, etc. , '[]'$  - 0 Emergency Hospital Admission (emergent condition) X- 1 10 Simple Discharge Coordination '[]'$  - 0 Complex (extensive) Discharge Coordination PROCESS - Special Needs '[]'$  - 0 Pediatric / Minor Patient Management '[]'$  - 0 Isolation Patient Management '[]'$  - 0 Hearing / Language / Visual special needs '[]'$  - 0 Assessment of Community assistance (transportation, D/C planning, etc.) '[]'$  - 0 Additional  assistance / Altered mentation '[]'$  - 0 Support Surface(s) Assessment (bed, cushion, seat, etc.) INTERVENTIONS - Wound Cleansing / Measurement X - Simple Wound Cleansing - one wound 1 5 '[]'$  - 0 Complex  Wound Cleansing - multiple wounds X- 1 5 Wound Imaging (photographs - any number of wounds) '[]'$  - 0 Wound Tracing (instead of photographs) X- 1 5 Simple Wound Measurement - one wound '[]'$  - 0 Complex Wound Measurement - multiple wounds INTERVENTIONS - Wound Dressings X - Small Wound Dressing one or multiple wounds 1 10 '[]'$  - 0 Medium Wound Dressing one or multiple wounds '[]'$  - 0 Large Wound Dressing one or multiple wounds '[]'$  - 0 Application of Medications - topical '[]'$  - 0 Application of Medications - injection INTERVENTIONS - Miscellaneous '[]'$  - 0 External ear exam '[]'$  - 0 Specimen Collection (cultures, biopsies, blood, body fluids, etc.) '[]'$  - 0 Specimen(s) / Culture(s) sent or taken to Lab for analysis '[]'$  - 0 Patient Transfer (multiple staff / Civil Service fast streamer / Similar devices) '[]'$  - 0 Simple Staple / Suture removal (25 or less) '[]'$  - 0 Complex Staple / Suture removal (26 or more) '[]'$  - 0 Hypo / Hyperglycemic Management (close monitor of Blood Glucose) '[]'$  - 0 Ankle / Brachial Index (ABI) - do not check if billed separately X- 1 5 Vital Signs Has the patient been seen at the hospital within the last three years: Yes Total Score: 100 Level Of Care: New/Established - Level 3 Electronic Signature(s) Signed: 01/06/2022 5:03:01 Church By: Deon Pilling Church, BSN Entered By: Deon Pilling on 01/06/2022 16:48:14 -------------------------------------------------------------------------------- Encounter Discharge Information Details Patient Name: Date of Service: [name] 01/03/2022 12:00 Church Medical Record Number: [id number] Patient Account Number: [id number] Date of Birth/Sex: Treating Church: 1975-06-16 (46 y.o. [name] Primary Care Gelila Well: [name] Other  Church: Referring Keairra Bardon: Treating Joni Norrod/Extender: [name] in Treatment: 3 Encounter Discharge Information Items Discharge Condition: Stable Ambulatory Status: Ambulatory Discharge Destination: Home Transportation: Private Auto Accompanied By: self Schedule Follow-up Appointment: Yes Clinical Summary of Care: Patient Declined Electronic Signature(s) Signed: 01/06/2022 4:12:00 Church By: [name] Entered By: [name] on 01/03/2022 12:03:48 -------------------------------------------------------------------------------- Patient/Caregiver Education Details Patient Name: Date of Service: [name] 9/29/2023andnbsp12:00 Church Medical Record Number: [id number] Patient Account Number: [id number] Date of Birth/Gender: Treating Church: [birth year] (46 y.o. [name] Primary Care Physician: [name]: Referring Physician: Treating Physician/Extender: [name] in Treatment: 3 Education Assessment Education Provided To: Patient Education Topics Provided Welcome T [facility]: o Methods: Explain/Verbal Responses: Reinforcements needed, State content correctly Electronic Signature(s) Signed: 01/06/2022 4:12:00 Church By: [name] Entered By: [name] on 01/03/2022 12:03:34 -------------------------------------------------------------------------------- Wound Assessment Details Patient Name: Date of Service: [name] 01/03/2022 12:00 Church Medical Record Number: [id number] Patient Account Number: [id number] Date of Birth/Sex: Treating Church: 30-Oct-1975 (46 y.o. [name] Primary Care Addalee Kavanagh: [name]: Referring Cassiel Fernandez: Treating Eashan Schipani/Extender: [name] in Treatment: 3 Wound Status Wound Number: 1 Primary Etiology: Abrasion Wound Location: Left, Lateral  Ankle Wound Status: Open Wounding Event: Laceration Date Acquired: 10/24/2021 Weeks Of Treatment: 3 Clustered Wound: No Wound Measurements Length: (cm) 1 Width: (cm) 1.2 Depth: (cm) 0.3 Area: (cm) 0.942 Volume: (cm) 0.283 % Reduction in Area: -42.7% % Reduction in Volume: -114.4% Wound Description Classification: Full Thickness Without Exposed Support Structu Exudate Amount: Medium Exudate Type: Serosanguineous Exudate Color: red, brown res Electronic Signature(s) Signed: 01/06/2022 4:12:00 Church By: [name] Entered By: [name] on 01/03/2022 12:03:10

## 2022-01-07 NOTE — Progress Notes (Signed)
VIVION, ROMANO ([id number]) Visit Report for 01/03/2022 SuperBill Details Patient Name: Date of Service: [name] 01/03/2022 Medical Record Number: [id number] Patient Account Number: [id number] Date of Birth/Sex: Treating RN: 1975/11/20 (46 y.o. 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

## 2022-01-10 ENCOUNTER — Encounter (HOSPITAL_BASED_OUTPATIENT_CLINIC_OR_DEPARTMENT_OTHER): Payer: Medicaid Other | Admitting: General Surgery

## 2022-01-10 DIAGNOSIS — E11622 Type 2 diabetes mellitus with other skin ulcer: Secondary | ICD-10-CM | POA: Diagnosis not present

## 2022-01-10 NOTE — Progress Notes (Signed)
ERMA, RAICHE ([id number]) Visit Report for 01/10/2022 SuperBill Details Patient Name: Date of Service: [name] 01/10/2022 Medical Record Number: [id number] Patient Account Number: [id number] Date of Birth/Sex: Treating RN: 11-24-1975 (46 y.o. 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

## 2022-01-10 NOTE — Progress Notes (Signed)
[name], SEEBECK ([id number]) Visit Report for 01/10/2022 Arrival Information Details Patient Name: Date of Service: [name] 01/10/2022 11:30 A M Medical Record Number: [id number] Patient Account Number: [id number] Date of Birth/Sex: Treating RN: 1975/06/23 (46 y.o. Harlow Ohms Primary Care Analisa Sledd: [name] Other Clinician: Referring Modine Oppenheimer: Treating Renardo Cheatum/Extender: [name] in Treatment: 4 Visit Information History Since Last Visit Added or deleted any medications: No Patient Arrived: Ambulatory Any new allergies or adverse reactions: No Arrival Time: 11:29 Had a fall or experienced change in No Accompanied By: self activities of daily living that may affect Transfer Assistance: None risk of falls: Patient Identification Verified: Yes Signs or symptoms of abuse/neglect since last visito No Secondary Verification Process Completed: Yes Hospitalized since last visit: No Patient Requires Transmission-Based Precautions: No Implantable device outside of the clinic excluding No Patient Has Alerts: Yes cellular tissue based products placed in the center Patient Alerts: ABI LLE 1.22 since last visit: Has Dressing in Place as Prescribed: Yes Pain Present Now: Yes Electronic Signature(s) Signed: 01/10/2022 4:25:48 PM By: Adline Peals Entered By: Adline Peals on 01/10/2022 11:29:52 -------------------------------------------------------------------------------- Clinic Level of Care Assessment Details Patient Name: Date of Service: [name] 01/10/2022 11:30 [place] Record Number: [id number] Patient Account Number: [id number] Date of Birth/Sex: Treating RN: 1975/11/20 (46 y.o. Harlow Ohms Primary Care Shenekia Riess: [name] Other Clinician: Referring [name]: Treating [name]/Extender: [name] in Treatment: 4 Clinic Level of Care Assessment  Items TOOL 4 Quantity Score X- 1 0 Use when only an EandM is performed on FOLLOW-UP visit ASSESSMENTS - Nursing Assessment / Reassessment '[]'$  - 0 Reassessment of Co-morbidities (includes updates in patient status) '[]'$  - 0 Reassessment of Adherence to Treatment Plan ASSESSMENTS - Wound and Skin A ssessment / Reassessment '[]'$  - 0 Simple Wound Assessment / Reassessment - one wound '[]'$  - 0 Complex Wound Assessment / Reassessment - multiple wounds '[]'$  - 0 Dermatologic / Skin Assessment (not related to wound area) ASSESSMENTS - Focused Assessment '[]'$  - 0 Circumferential Edema Measurements - multi extremities '[]'$  - 0 Nutritional Assessment / Counseling / Intervention '[]'$  - 0 Lower Extremity Assessment (monofilament, tuning fork, pulses) '[]'$  - 0 Peripheral Arterial Disease Assessment (using hand held doppler) ASSESSMENTS - Ostomy and/or Continence Assessment and Care '[]'$  - 0 Incontinence Assessment and Management '[]'$  - 0 Ostomy Care Assessment and Management (repouching, etc.) PROCESS - Coordination of Care X - Simple Patient / Family Education for ongoing care 1 15 '[]'$  - 0 Complex (extensive) Patient / Family Education for ongoing care X- 1 10 Staff obtains Programmer, systems, Records, T Results / Process Orders est '[]'$  - 0 Staff telephones HHA, Nursing Homes / Clarify orders / etc '[]'$  - 0 Routine Transfer to another Facility (non-emergent condition) '[]'$  - 0 Routine Hospital Admission (non-emergent condition) '[]'$  - 0 New Admissions / Biomedical engineer / Ordering NPWT Apligraf, etc. , '[]'$  - 0 Emergency Hospital Admission (emergent condition) X- 1 10 Simple Discharge Coordination '[]'$  - 0 Complex (extensive) Discharge Coordination PROCESS - Special Needs '[]'$  - 0 Pediatric / Minor Patient Management '[]'$  - 0 Isolation Patient Management '[]'$  - 0 Hearing / Language / Visual special needs '[]'$  - 0 Assessment of Community assistance (transportation, D/C planning, etc.) '[]'$  - 0 Additional  assistance / Altered mentation '[]'$  - 0 Support Surface(s) Assessment (bed, cushion, seat, etc.) INTERVENTIONS - Wound Cleansing / Measurement X - Simple Wound Cleansing - one wound 1 5 '[]'$  - 0 Complex  Wound Cleansing - multiple wounds '[]'$  - 0 Wound Imaging (photographs - any number of wounds) '[]'$  - 0 Wound Tracing (instead of photographs) '[]'$  - 0 Simple Wound Measurement - one wound '[]'$  - 0 Complex Wound Measurement - multiple wounds INTERVENTIONS - Wound Dressings X - Small Wound Dressing one or multiple wounds 1 10 '[]'$  - 0 Medium Wound Dressing one or multiple wounds '[]'$  - 0 Large Wound Dressing one or multiple wounds '[]'$  - 0 Application of Medications - topical '[]'$  - 0 Application of Medications - injection INTERVENTIONS - Miscellaneous '[]'$  - 0 External ear exam '[]'$  - 0 Specimen Collection (cultures, biopsies, blood, body fluids, etc.) '[]'$  - 0 Specimen(s) / Culture(s) sent or taken to Lab for analysis '[]'$  - 0 Patient Transfer (multiple staff / Civil Service fast streamer / Similar devices) '[]'$  - 0 Simple Staple / Suture removal (25 or less) '[]'$  - 0 Complex Staple / Suture removal (26 or more) '[]'$  - 0 Hypo / Hyperglycemic Management (close monitor of Blood Glucose) '[]'$  - 0 Ankle / Brachial Index (ABI) - do not check if billed separately X- 1 5 Vital Signs Has the patient been seen at the hospital within the last three years: Yes Total Score: 55 Level Of Care: New/Established - Level 2 Electronic Signature(s) Signed: 01/10/2022 4:25:48 PM By: Adline Peals Entered By: Adline Peals on 01/10/2022 11:43:54 -------------------------------------------------------------------------------- Encounter Discharge Information Details Patient Name: Date of Service: [name] [facility]ELINE 01/10/2022 11:30 A M Medical Record Number: [id number] Patient Account Number: [id number] Date of Birth/Sex: Treating RN: [birth year] (46 y.o. Harlow Ohms Primary Care Elsworth Ledin: [name] Other  Clinician: Referring Blayne Garlick: Treating Kimberli Winne/Extender: [name] in Treatment: 4 Encounter Discharge Information Items Discharge Condition: Stable Ambulatory Status: Ambulatory Discharge Destination: Home Transportation: Private Auto Accompanied By: self Schedule Follow-up Appointment: Yes Clinical Summary of Care: Patient Declined Electronic Signature(s) Signed: 01/10/2022 4:25:48 PM By: Sabas Sous By: Adline Peals on 01/10/2022 11:43:18 -------------------------------------------------------------------------------- Patient/Caregiver Education Details Patient Name: Date of Service: [name] 10/6/2023andnbsp11:30 [place] Record Number: [id number] Patient Account Number: [id number] Date of Birth/Gender: Treating RN: 1975-12-18 (46 y.o. Harlow Ohms Primary Care Physician: [name] Other Clinician: Referring Physician: Treating Physician/Extender: [name] in Treatment: 4 Education Assessment Education Provided To: Patient Education Topics Provided Wound/Skin Impairment: Methods: Explain/Verbal Responses: Reinforcements needed, State content correctly Electronic Signature(s) Signed: 01/10/2022 4:25:48 PM By: Adline Peals Entered By: Adline Peals on 01/10/2022 11:43:05 -------------------------------------------------------------------------------- Wound Assessment Details Patient Name: Date of Service: [name] 01/10/2022 11:30 A M Medical Record Number: [id number] Patient Account Number: [id number] Date of Birth/Sex: Treating RN: 08-19-1975 (46 y.o. Harlow Ohms Primary Care Marlise Fahr: [name] Other Clinician: Referring Bhargav Barbaro: Treating Asiyah Pineau/Extender: [name] in Treatment: 4 Wound Status Wound Number: 1 Primary Etiology: Abrasion Wound Location: Left, Lateral Ankle Wound  Status: Open Wounding Event: Laceration Comorbid History: Hypertension, Type II Diabetes Date Acquired: 10/24/2021 Weeks Of Treatment: 4 Clustered Wound: No Wound Measurements Length: (cm) 1.7 Width: (cm) 1.4 Depth: (cm) 0.2 Area: (cm) 1.869 Volume: (cm) 0.374 % Reduction in Area: -183.2% % Reduction in Volume: -183.3% Wound Description Classification: Full Thickness Without Exposed Support Structu Exudate Amount: Medium Exudate Type: Serosanguineous Exudate Color: red, brown res Wound Bed Exposed Structure Fascia Exposed: No Fat Layer (Subcutaneous Tissue) Exposed: Yes Tendon Exposed: No Muscle Exposed: No Joint Exposed: No Bone Exposed: No Periwound Skin Texture Texture Color No Abnormalities Noted: No No  Abnormalities Noted: No Callus: No Temperature / Pain Crepitus: No Temperature: Cool/Cold Excoriation: Yes Induration: No Rash: No Scarring: No Moisture No Abnormalities Noted: No Dry / Scaly: No Maceration: Yes Treatment Notes Wound #1 (Ankle) Wound Laterality: Left, Lateral Cleanser Soap and Water Discharge Instruction: May shower and wash wound with dial antibacterial soap and water prior to dressing change. Peri-Wound Care Sween Lotion (Moisturizing lotion) Discharge Instruction: Apply moisturizing lotion as directed Topical Primary Dressing KerraCel Ag Gelling Fiber Dressing, 2x2 in (silver alginate) Discharge Instruction: Apply silver alginate to wound bed as instructed Secondary Dressing Bordered Gauze, 4x4 in Discharge Instruction: Apply over primary dressing as directed. Optifoam Non-Adhesive Dressing, 4x4 in Discharge Instruction: Apply over primary dressing as directed. Secured With [employer], 4.5x3.1 (in/yd) Discharge Instruction: Secure with Kerlix as directed. 42M Medipore Soft Cloth Surgical T 2x10 (in/yd) ape Discharge Instruction: Secure with tape as directed. Compression Wrap Compression Stockings Add-Ons Electronic  Signature(s) Signed: 01/10/2022 4:25:48 PM By: Adline Peals Entered By: Adline Peals on 01/10/2022 11:30:29 -------------------------------------------------------------------------------- Vitals Details Patient Name: Date of Service: [name] [facility]ELINE 01/10/2022 11:30 A M Medical Record Number: [id number] Patient Account Number: [id number] Date of Birth/Sex: Treating RN: 02-Dec-1975 (46 y.o. Harlow Ohms Primary Care Kitzia Camus: [name] Other Clinician: Referring Sueanne Maniaci: Treating Babara Buffalo/Extender: [name] in Treatment: 4 Vital Signs Time Taken: 11:30 Temperature (F): 98.4 Height (in): 58 Pulse (bpm): 105 Weight (lbs): 111 Respiratory Rate (breaths/min): 16 Body Mass Index (BMI): 23.2 Blood Pressure (mmHg): 115/63 Reference Range: 80 - 120 mg / dl Electronic Signature(s) Signed: 01/10/2022 4:25:48 PM By: Adline Peals Entered By: Adline Peals on 01/10/2022 11:31:28

## 2022-01-13 ENCOUNTER — Other Ambulatory Visit: Payer: Self-pay

## 2022-01-13 ENCOUNTER — Encounter (HOSPITAL_COMMUNITY): Payer: Self-pay

## 2022-01-13 ENCOUNTER — Emergency Department (HOSPITAL_COMMUNITY)
Admission: EM | Admit: 2022-01-13 | Discharge: 2022-01-14 | Disposition: A | Discharge status: Home / Self Care | Payer: Medicaid Other | Attending: Emergency Medicine | Admitting: Emergency Medicine

## 2022-01-13 DIAGNOSIS — I10 Essential (primary) hypertension: Secondary | ICD-10-CM | POA: Insufficient documentation

## 2022-01-13 DIAGNOSIS — Z79899 Other long term (current) drug therapy: Secondary | ICD-10-CM | POA: Insufficient documentation

## 2022-01-13 DIAGNOSIS — Z7984 Long term (current) use of oral hypoglycemic drugs: Secondary | ICD-10-CM | POA: Insufficient documentation

## 2022-01-13 DIAGNOSIS — R739 Hyperglycemia, unspecified: Secondary | ICD-10-CM | POA: Diagnosis not present

## 2022-01-13 DIAGNOSIS — Z794 Long term (current) use of insulin: Secondary | ICD-10-CM | POA: Diagnosis not present

## 2022-01-13 DIAGNOSIS — R040 Epistaxis: Secondary | ICD-10-CM | POA: Diagnosis present

## 2022-01-13 LAB — BASIC METABOLIC PANEL
Anion gap: 7 (ref 5–15)
BUN: 22 mg/dL — ABNORMAL HIGH (ref 6–20)
CO2: 24 mmol/L (ref 22–32)
Calcium: 9 mg/dL (ref 8.9–10.3)
Chloride: 103 mmol/L (ref 98–111)
Creatinine, Ser: 0.61 mg/dL (ref 0.44–1.00)
GFR, Estimated: >60 mL/min (ref >60)
Glucose, Bld: 131 mg/dL — ABNORMAL HIGH (ref 70–99)
Potassium: 3.8 mmol/L (ref 3.5–5.1)
Sodium: 134 mmol/L — ABNORMAL LOW (ref 135–145)

## 2022-01-13 LAB — CBC
HCT: 29.4 % — ABNORMAL LOW (ref 36.0–46.0)
Hemoglobin: 8.8 g/dL — ABNORMAL LOW (ref 12.0–15.0)
MCH: 21.8 pg — ABNORMAL LOW (ref 26.0–34.0)
MCHC: 29.9 g/dL — ABNORMAL LOW (ref 30.0–36.0)
MCV: 72.8 fL — ABNORMAL LOW (ref 80.0–100.0)
Platelets: 499 10*3/uL — ABNORMAL HIGH (ref 150–400)
RBC: 4.04 MIL/uL (ref 3.87–5.11)
RDW: 18.4 % — ABNORMAL HIGH (ref 11.5–15.5)
WBC: 7.3 10*3/uL (ref 4.0–10.5)
nRBC: 0 % (ref 0.0–0.2)

## 2022-01-13 LAB — CBG MONITORING, ED: Glucose-Capillary: 170 mg/dL — ABNORMAL HIGH (ref 70–99)

## 2022-01-13 NOTE — ED Provider Notes (Signed)
[facility]-EMERGENCY DEPT Provider Note   CSN: [id number] Arrival date & time: 01/13/22  2146     History {Add pertinent medical, surgical, social history, OB history to HPI:1} Chief Complaint  Patient presents with   Hyperglycemia    [name] is a 46 y.o. female.  The history is provided by the patient. No language interpreter was used.  Hyperglycemia      Home Medications Prior to Admission medications   Medication Sig Start Date End Date Taking? Authorizing Provider  Acetaminophen 500 MG capsule Take 500-1,000 mg by mouth every 6 (six) hours as needed for pain.   Yes [provider]  ASPIRIN LOW DOSE 81 MG EC tablet Take 1 tablet (81 mg total) by mouth daily. 07/24/21  Yes Storm Frisk, MD  fluticasone (FLONASE) 50 MCG/ACT nasal spray Place 1 spray into both nostrils daily. Patient taking differently: Place 1 spray into both nostrils daily as needed for allergies or rhinitis. 08/01/21  Yes Zigmund Daniel., MD  insulin glargine (LANTUS) 100 UNIT/ML injection Inject 0.3 mLs (30 Units total) into the skin in the morning AND 0.2 mLs (20 Units total) every evening. Patient taking differently: Inject 40 units into the skin in the morning and 30 units at bedtime 07/03/21  Yes Storm Frisk, MD  Insulin Syringe-Needle U-100 30G X 5/16" 0.5 ML MISC Use to administer insulin 2 (two) times daily. 07/03/21   Storm Frisk, MD  metFORMIN (GLUCOPHAGE) 500 MG tablet Take 1 tablet (500 mg total) by mouth 2 (two) times daily with a meal. 07/24/21  Yes Storm Frisk, MD  omeprazole (PRILOSEC) 20 MG capsule Take 1 capsule (20 mg total) by mouth daily. Patient taking differently: Take 20 mg by mouth daily before breakfast. 06/26/17  Yes Mathews Robinsons B, PA-C  pravastatin (PRAVACHOL) 20 MG tablet Take 20 mg by mouth daily.   Yes [provider]  Accu-Chek Softclix Lancets lancets Use 3 (three) times daily as directed 08/08/21    Storm Frisk, MD  Blood Glucose Monitoring Suppl (ACCU-CHEK GUIDE) w/Device KIT Use to check blood sugar 3 (three) times daily. 07/03/21   Storm Frisk, MD  EASY COMFORT PEN NEEDLES 31G X 5 MM MISC USE TWICE A DAY WITH INSULIN PEN 12/30/17   [provider]  glucose blood (ACCU-CHEK GUIDE) test strip Use 3 (three) times daily as instructed 07/03/21   Storm Frisk, MD  Insulin Syringe-Needle U-100 (INSULIN SYRINGE .5CC/31GX5/16") 31G X 5/16" 0.5 ML MISC Inject 1 Syringe into the skin daily. 09/18/21   Storm Frisk, MD  Lancets Misc. (ACCU-CHEK SOFTCLIX LANCET DEV) KIT Use to check blood sugar twice a day 07/24/21   Storm Frisk, MD  ondansetron (ZOFRAN) 4 MG tablet Take 1 tablet (4 mg total) by mouth every 6 (six) hours. Patient not taking: Reported on 12/17/2021 02/07/17   Donnetta Hutching, MD  silver sulfADIAZINE (SILVADENE) 1 % cream Apply 1 application topically to wound on both ankles once daily. Patient not taking: Reported on 12/17/2021 07/03/21   Storm Frisk, MD      Allergies    Patient has no known allergies.    Review of Systems   Review of Systems  Physical Exam Updated Vital Signs BP (!) 142/92   Pulse 92   Temp 97.9 F (36.6 C)   Resp 15   Ht 4\' 9"  (1.448 m)   Wt 44.9 kg   LMP 01/13/2022   SpO2 100%  BMI 21.42 kg/m  Physical Exam  ED Results / Procedures / Treatments   Labs (all labs ordered are listed, but only abnormal results are displayed) Labs Reviewed  BASIC METABOLIC PANEL - Abnormal; Notable for the following components:      Result Value   Sodium 134 (*)    Glucose, Bld 131 (*)    BUN 22 (*)    All other components within normal limits  CBC - Abnormal; Notable for the following components:   Hemoglobin 8.8 (*)    HCT 29.4 (*)    MCV 72.8 (*)    MCH 21.8 (*)    MCHC 29.9 (*)    RDW 18.4 (*)    Platelets 499 (*)    All other components within normal limits  CBG MONITORING, ED - Abnormal; Notable for the following  components:   Glucose-Capillary 170 (*)    All other components within normal limits    EKG None  Radiology No results found.  Procedures Procedures  {Document cardiac monitor, telemetry assessment procedure when appropriate:1}  Medications Ordered in ED Medications - No data to display  ED Course/ Medical Decision Making/ A&P                           Medical Decision Making Amount and/or Complexity of Data Reviewed Labs: ordered.   ***  {Document critical care time when appropriate:1} {Document review of labs and clinical decision tools ie heart score, Chads2Vasc2 etc:1}  {Document your independent review of radiology images, and any outside records:1} {Document your discussion with family members, caretakers, and with consultants:1} {Document social determinants of health affecting pt's care:1} {Document your decision making why or why not admission, treatments were needed:1} Final Clinical Impression(s) / ED Diagnoses Final diagnoses:  Left-sided epistaxis  Hyperglycemia    Rx / DC Orders ED Discharge Orders     None

## 2022-01-13 NOTE — ED Triage Notes (Signed)
Pt had a nose bleed around 6 pm, said her blood pressure was high, now her blood sugar is high at 294 per ems. Pt did not take her metformin this am but did take her lantus this evening.

## 2022-01-13 NOTE — Discharge Instructions (Signed)
Your evaluation in the ED was reassuring.  We recommend follow-up with your primary care doctor.  Continue your daily prescribed medications.

## 2022-01-14 ENCOUNTER — Encounter (HOSPITAL_BASED_OUTPATIENT_CLINIC_OR_DEPARTMENT_OTHER): Payer: Medicaid Other | Admitting: General Surgery

## 2022-01-14 DIAGNOSIS — E11622 Type 2 diabetes mellitus with other skin ulcer: Secondary | ICD-10-CM | POA: Diagnosis not present

## 2022-01-14 NOTE — Progress Notes (Signed)
YUETTE, PUTNAM ([id number]) [id number] Page 1 of 8 Visit Report for 01/14/2022 Arrival Information Details Patient Name: Date of Service: [name] 01/14/2022 12:45 PM Medical Record Number: [id number] Patient Account Number: [id number] Date of Birth/Sex: Treating RN: December 26, 1975 (46 y.o. [name] Primary Care Rahma Meller: [name] Other Clinician: Referring Lijah Bourque: Treating Francis Doenges/Extender: [name] in Treatment: 4 Visit Information History Since Last Visit Added or deleted any medications: No Patient Arrived: Ambulatory Any new allergies or adverse reactions: No Arrival Time: 12:41 Had a fall or experienced change in No Accompanied By: self activities of daily living that may affect Transfer Assistance: None risk of falls: Patient Identification Verified: Yes Signs or symptoms of abuse/neglect since last visito No Secondary Verification Process Completed: Yes Hospitalized since last visit: No Patient Requires Transmission-Based Precautions: No Implantable device outside of the clinic excluding No Patient Has Alerts: Yes cellular tissue based products placed in the center Patient Alerts: ABI LLE 1.22 since last visit: Has Dressing in Place as Prescribed: Yes Pain Present Now: No Electronic Signature(s) Signed: 01/14/2022 4:02:37 PM By: Adline Peals Entered By: Adline Peals on 01/14/2022 12:42:32 -------------------------------------------------------------------------------- Clinic Level of Care Assessment Details Patient Name: Date of Service: [name] 01/14/2022 12:45 PM Medical Record Number: [id number] Patient Account Number: [id number] Date of Birth/Sex: Treating RN: 1975-12-31 (46 y.o. [name] Primary Care Parul Porcelli: [name] Other Clinician: Referring Jonea Bukowski: Treating Thoams Siefert/Extender: [name]  in Treatment: 4 Clinic Level of Care Assessment Items TOOL 4 Quantity Score X- 1 0 Use when only an EandM is performed on FOLLOW-UP visit ASSESSMENTS - Nursing Assessment / Reassessment X- 1 10 Reassessment of Co-morbidities (includes updates in patient status) X- 1 5 Reassessment of Adherence to Treatment Plan ASSESSMENTS - Wound and Skin A ssessment / Reassessment X - Simple Wound Assessment / Reassessment - one wound 1 5 '[]'$  - 0 Complex Wound Assessment / Reassessment - multiple wounds '[]'$  - 0 Dermatologic / Skin Assessment (not related to wound area) ASSESSMENTS - Focused Assessment X- 1 5 Circumferential Edema Measurements - multi extremities '[]'$  - 0 Nutritional Assessment / Counseling / Intervention TYFFANI, FOGLESONG ([id number]) [id number] Page 2 of 8 X- 1 5 Lower Extremity Assessment (monofilament, tuning fork, pulses) '[]'$  - 0 Peripheral Arterial Disease Assessment (using hand held doppler) ASSESSMENTS - Ostomy and/or Continence Assessment and Care '[]'$  - 0 Incontinence Assessment and Management '[]'$  - 0 Ostomy Care Assessment and Management (repouching, etc.) PROCESS - Coordination of Care X - Simple Patient / Family Education for ongoing care 1 15 '[]'$  - 0 Complex (extensive) Patient / Family Education for ongoing care X- 1 10 Staff obtains Programmer, systems, Records, T Results / Process Orders est '[]'$  - 0 Staff telephones HHA, Nursing Homes / Clarify orders / etc '[]'$  - 0 Routine Transfer to another Facility (non-emergent condition) '[]'$  - 0 Routine Hospital Admission (non-emergent condition) '[]'$  - 0 New Admissions / Biomedical engineer / Ordering NPWT Apligraf, etc. , '[]'$  - 0 Emergency Hospital Admission (emergent condition) X- 1 10 Simple Discharge Coordination '[]'$  - 0 Complex (extensive) Discharge Coordination PROCESS - Special Needs '[]'$  - 0 Pediatric / Minor Patient Management '[]'$  - 0 Isolation Patient Management '[]'$  - 0 Hearing / Language  / Visual special needs '[]'$  - 0 Assessment of Community assistance (transportation, D/C planning, etc.) '[]'$  - 0 Additional assistance / Altered mentation '[]'$  - 0 Support Surface(s) Assessment (bed, cushion, seat, etc.) INTERVENTIONS - Wound Cleansing / Measurement X -  Simple Wound Cleansing - one wound 1 5 '[]'$  - 0 Complex Wound Cleansing - multiple wounds X- 1 5 Wound Imaging (photographs - any number of wounds) '[]'$  - 0 Wound Tracing (instead of photographs) '[]'$  - 0 Simple Wound Measurement - one wound '[]'$  - 0 Complex Wound Measurement - multiple wounds INTERVENTIONS - Wound Dressings X - Small Wound Dressing one or multiple wounds 1 10 '[]'$  - 0 Medium Wound Dressing one or multiple wounds '[]'$  - 0 Large Wound Dressing one or multiple wounds X- 1 5 Application of Medications - topical '[]'$  - 0 Application of Medications - injection INTERVENTIONS - Miscellaneous '[]'$  - 0 External ear exam '[]'$  - 0 Specimen Collection (cultures, biopsies, blood, body fluids, etc.) '[]'$  - 0 Specimen(s) / Culture(s) sent or taken to Lab for analysis '[]'$  - 0 Patient Transfer (multiple staff / Civil Service fast streamer / Similar devices) '[]'$  - 0 Simple Staple / Suture removal (25 or less) '[]'$  - 0 Complex Staple / Suture removal (26 or more) '[]'$  - 0 Hypo / Hyperglycemic Management (close monitor of Blood Glucose) [name], [name] ([id number]) [id number] Page 3 of 8 '[]'$  - 0 Ankle / Brachial Index (ABI) - do not check if billed separately X- 1 5 Vital Signs Has the patient been seen at the hospital within the last three years: Yes Total Score: 95 Level Of Care: New/Established - Level 3 Electronic Signature(s) Signed: 01/14/2022 4:02:37 PM By: Adline Peals Entered By: Adline Peals on 01/14/2022 13:20:35 -------------------------------------------------------------------------------- Encounter Discharge Information Details Patient Name: Date of Service: Tina [name]  01/14/2022 12:45 PM Medical Record Number: [id number] Patient Account Number: [id number] Date of Birth/Sex: Treating RN: 13-Mar-1976 (46 y.o. [name] Primary Care Fraidy Mccarrick: [name] Other Clinician: Referring Gwynn Crossley: Treating Hinata Diener/Extender: [name] in Treatment: 4 Encounter Discharge Information Items Discharge Condition: Stable Ambulatory Status: Ambulatory Discharge Destination: Home Transportation: Private Auto Accompanied By: self Schedule Follow-up Appointment: Yes Clinical Summary of Care: Patient Declined Electronic Signature(s) Signed: 01/14/2022 4:02:37 PM By: Sabas Sous By: Adline Peals on 01/14/2022 13:21:03 -------------------------------------------------------------------------------- Lower Extremity Assessment Details Patient Name: Date of Service: [name] 01/14/2022 12:45 PM Medical Record Number: [id number] Patient Account Number: [id number] Date of Birth/Sex: Treating RN: 05/03/1975 (46 y.o. [name] Primary Care Vicky Schleich: [name] Other Clinician: Referring Evian Salguero: Treating Odie Rauen/Extender: [name] in Treatment: 4 Edema Assessment Assessed: [Left: No] [Right: No] [Left: Edema] [Right: :] Calf Left: Right: Point of Measurement: From Medial Instep 31.8 cm Ankle Left: Right: Point of Measurement: From Medial Instep 17 cm Vascular Assessment [name], [name] ([id number]) [[id number] Page 4 of 8] Pulses: Dorsalis Pedis Palpable: [Left:Yes] Electronic Signature(s) Signed: 01/14/2022 4:02:37 PM By: Adline Peals Entered By: Adline Peals on 01/14/2022 12:44:40 -------------------------------------------------------------------------------- Multi Wound Chart Details Patient Name: Date of Service: [name] 01/14/2022 12:45 PM Medical Record Number:  [id number] Patient Account Number: [id number] Date of Birth/Sex: Treating RN: 07/06/1975 (46 y.o. [name] Primary Care Danyal Adorno: [name] Other Clinician: Referring Alverna Fawley: Treating Taytum Scheck/Extender: [name] in Treatment: 4 Vital Signs Height(in): 58 Pulse(bpm): 103 Weight(lbs): 111 Blood Pressure(mmHg): 105/66 Body Mass Index(BMI): 23.2 Temperature(F): 98.1 Respiratory Rate(breaths/min): 16 [1:Photos:] [N/A:N/A] Left, Lateral Ankle N/A N/A Wound Location: Laceration N/A N/A Wounding Event: Abrasion N/A N/A Primary Etiology: Hypertension, Type II Diabetes N/A N/A Comorbid History: 10/24/2021 N/A N/A Date Acquired: 4 N/A N/A Weeks of Treatment: Open N/A N/A Wound Status: No N/A N/A Wound Recurrence:  0x0x0 N/A N/A Measurements L x W x D (cm) 0 N/A N/A A (cm) : rea 0 N/A N/A Volume (cm) : 100.00% N/A N/A % Reduction in A rea: 100.00% N/A N/A % Reduction in Volume: Full Thickness Without Exposed N/A N/A Classification: Support Structures None Present N/A N/A Exudate Amount: Flat and Intact N/A N/A Wound Margin: None Present (0%) N/A N/A Granulation Amount: None Present (0%) N/A N/A Necrotic Amount: Fascia: No N/A N/A Exposed Structures: Fat Layer (Subcutaneous Tissue): No Tendon: No Muscle: No Joint: No Bone: No Large (67-100%) N/A N/A Epithelialization: Excoriation: Yes N/A N/A Periwound Skin Texture: Induration: No Callus: No Crepitus: No Rash: No Scarring: No Maceration: Yes N/A N/A Periwound Skin Moisture: Dry/Scaly: No No Abnormalities Noted N/A N/A Periwound Skin ColorKASUMI, [name] ([id number]) [id number] Page 5 of 8 No Abnormality N/A N/A Temperature: Treatment Notes Electronic Signature(s) Signed: 01/14/2022 12:50:57 PM By: Fredirick Maudlin MD FACS Signed: 01/14/2022 4:02:37 PM By: Sabas Sous By: Fredirick Maudlin on 01/14/2022  12:50:57 -------------------------------------------------------------------------------- [place] Details Patient Name: Date of Service: [name] 01/14/2022 12:45 PM Medical Record Number: [id number] Patient Account Number: [id number] Date of Birth/Sex: Treating RN: 09/11/1975 (46 y.o. [name] Primary Care Ja Ohman: [name] Other Clinician: Referring Zollie Clemence: Treating Matrice Herro/Extender: [name] in Treatment: 4 Active Inactive Electronic Signature(s) Signed: 01/14/2022 4:02:37 PM By: Sabas Sous By: Adline Peals on 01/14/2022 13:20:07 -------------------------------------------------------------------------------- Pain Assessment Details Patient Name: Date of Service: [name] 01/14/2022 12:45 PM Medical Record Number: [id number] Patient Account Number: [id number] Date of Birth/Sex: Treating RN: March 19, 1976 (46 y.o. [name] Primary Care Michaeljoseph Revolorio: [name] Other Clinician: Referring Pheonix Wisby: Treating Nishika Parkhurst/Extender: [name] in Treatment: 4 Active Problems Location of Pain Severity and Description of Pain Patient Has Paino No Site Locations Rate the pain. Current Pain Level: 0 [name], [name] ([id number]) [id number] Page 6 of 8 Pain Management and Medication Current Pain Management: Electronic Signature(s) Signed: 01/14/2022 4:02:37 PM By: Adline Peals Entered By: Adline Peals on 01/14/2022 12:42:49 -------------------------------------------------------------------------------- Patient/Caregiver Education Details Patient Name: Date of Service: [name] 10/10/2023andnbsp12:45 PM Medical Record Number: [id number] Patient Account Number: [id number] Date of Birth/Gender: Treating RN: 07/28/1975 (46 y.o. [name] Primary Care Physician:  [name] Other Clinician: Referring Physician: Treating Physician/Extender: [name] in Treatment: 4 Education Assessment Education Provided To: Patient Education Topics Provided Wound/Skin Impairment: Methods: Explain/Verbal Responses: Reinforcements needed, State content correctly Electronic Signature(s) Signed: 01/14/2022 4:02:37 PM By: Adline Peals Entered By: Adline Peals on 01/14/2022 12:48:30 -------------------------------------------------------------------------------- Wound Assessment Details Patient Name: Date of Service: [name] 01/14/2022 12:45 PM Medical Record Number: [id number] Patient Account Number: [id number] Date of Birth/Sex: Treating RN: 18-Jul-1975 (46 y.o. [name] Primary Care Milissa Fesperman: [name] Other Clinician: Referring Lanelle Lindo: Treating Sapna Padron/Extender: [name] in Treatment: 4 Wound Status Wound Number: 1 Primary Etiology: Abrasion Wound Location: Left, Lateral Ankle Wound Status: Open Wounding Event: Laceration Comorbid History: Hypertension, Type II Diabetes Date Acquired: 10/24/2021 Weeks Of Treatment: 4 Clustered Wound: No Photos [name], [name] ([id number]) [id number] Page 7 of 8 Wound Measurements Length: (cm) Width: (cm) Depth: (cm) Area: (cm) Volume: (cm) 0 % Reduction in Area: 100% 0 % Reduction in Volume: 100% 0 Epithelialization: Large (67-100%) 0 Tunneling: No 0 Undermining: No Wound Description Classification: Full Thickness Without Exposed Support Structures Wound Margin: Flat and Intact Exudate Amount: None Present Foul Odor After  Cleansing: No Slough/Fibrino No Wound Bed Granulation Amount: None Present (0%) Exposed Structure Necrotic Amount: None Present (0%) Fascia Exposed: No Fat Layer (Subcutaneous Tissue) Exposed: No Tendon Exposed: No Muscle Exposed: No Joint  Exposed: No Bone Exposed: No Periwound Skin Texture Texture Color No Abnormalities Noted: Yes No Abnormalities Noted: Yes Moisture Temperature / Pain No Abnormalities Noted: Yes Temperature: No Abnormality Electronic Signature(s) Signed: 01/14/2022 4:02:37 PM By: Adline Peals Entered By: Adline Peals on 01/14/2022 12:46:33 -------------------------------------------------------------------------------- Vitals Details Patient Name: Date of Service: Tina Desanctis Church 01/14/2022 12:45 PM Medical Record Number: [id number] Patient Account Number: [id number] Date of Birth/Sex: Treating RN: 1975-10-15 (46 y.o. [name] Primary Care Litha Lamartina: [name] Other Clinician: Referring Obie Kallenbach: Treating Glover Capano/Extender: [name] in Treatment: 4 Vital Signs Time Taken: 12:42 Temperature (F): 98.1 Height (in): 58 Pulse (bpm): 103 Weight (lbs): 111 Respiratory Rate (breaths/min): 16 Body Mass Index (BMI): 23.2 Blood Pressure (mmHg): 105/66 Reference Range: 80 - 120 mg / dl Electronic Signature(s) Signed: 01/14/2022 4:02:37 PM By: Adline Peals Entered By: Adline Peals on 01/14/2022 12:42:44 Claretta Fraise ([id number]) [id number] Page 8 of 8

## 2022-01-14 NOTE — Progress Notes (Signed)
LOVELYN, SHEERAN ([id number]) [id number] Page 1 of 7 Visit Report for 01/14/2022 Chief Complaint Document Details Patient Name: Date of Service: [name] [name] 01/14/2022 12:45 PM Medical Record Number: [id number] Patient Account Number: [id number] Date of Birth/Sex: Treating RN: 07-Nov-1975 (46 y.o. [name] Primary Care Provider: [name] Other Clinician: Referring Provider: Treating Provider/Extender: [name] in Treatment: 4 Information Obtained from: Patient Chief Complaint Patient seen for complaints of Non-Healing Wound. Electronic Signature(s) Signed: 01/14/2022 12:51:06 PM By: Fredirick Maudlin MD FACS Entered By: Fredirick Maudlin on 01/14/2022 12:51:05 -------------------------------------------------------------------------------- HPI Details Patient Name: Date of Service: [name] 01/14/2022 12:45 PM Medical Record Number: [id number] Patient Account Number: [id number] Date of Birth/Sex: Treating RN: 07-Jun-1975 (46 y.o. [name] Primary Care Provider: [name] Other Clinician: Referring Provider: Treating Provider/Extender: [name] in Treatment: 4 History of Present Illness HPI Description: ADMISSION 12/11/2021 History is somewhat limited secondary to intellectual delay. Some of the history has been garnered from review of the electronic medical record. The patient reports that she was sitting on pavement with her left foot tucked underneath her. She says that she developed a wound on that site. She saw her PCP. They note from that clinic date (11/13/2021: Says that the wound was open but dried without any drainage. Left foot edema was described. A 7-day course of Keflex was prescribed. She is followed by podiatry for routine diabetic foot care. When she saw them on August 15, the provider referred her to a different  podiatrist who debrided the wound. He applied Iodosorb and prescribed Silvadene daily dressing changes. The patient states that she has not been changing her dressing. She apparently resides in some sort of facility and she says there are nurses and doctors there. Specific details are difficult to sort out. She denies any significant pain to the wound. On her left lateral malleolus, there is a circular wound that does seem consistent with the history provided. There is some slough buildup in the center of the wound. No fluctuance, odor, or purulent drainage. 12/16/2021: The wound is about the same size today but the surface is quite a bit cleaner. 12/23/2021: The wound continues to contract. Has developed some undermining but this appears to be secondary to some perimeter eschar. No concern for infection. 12/30/2021: The area of undermining has broken down and she now has 3 small areas that are open. They all connect under the thin bands of skin. Her drainage has decreased. There is a little bit of slough and eschar accumulation. 01/06/2022: The wound looks a bit shallower today; I removed the skin that was causing the undermining at her last visit with me. There is a little bit of slough overlying good granulation tissue at the more distal aspect of the wound. The more proximal aspect is a bit pale and fibrous. 01/14/2022: Her wound has healed. Electronic Signature(s) Signed: 01/14/2022 12:52:57 PM By: Fredirick Maudlin MD [name] ([id number]) [id number] Page 2 of 7 Entered By: Fredirick Maudlin on 01/14/2022 12:52:57 -------------------------------------------------------------------------------- Physical Exam Details Patient Name: Date of Service: [name] 01/14/2022 12:45 PM Medical Record Number: [id number] Patient Account Number: [id number] Date of Birth/Sex: Treating RN: Oct 05, 1975 (46 y.o. [name] Primary Care Provider:  [name] Other Clinician: Referring Provider: Treating Provider/Extender: [name] in Treatment: 4 Constitutional . Slightly tachycardic, asymptomatic. . . No acute distress.Marland Kitchen Respiratory Normal work of breathing on room air.. Notes 01/14/2022:  Her wound is healed. Electronic Signature(s) Signed: 01/14/2022 1:04:09 PM By: Fredirick Maudlin MD FACS Entered By: Fredirick Maudlin on 01/14/2022 13:04:08 -------------------------------------------------------------------------------- Physician Orders Details Patient Name: Date of Service: [name] 01/14/2022 12:45 PM Medical Record Number: [id number] Patient Account Number: [id number] Date of Birth/Sex: Treating RN: 29-Nov-1975 (46 y.o. [name] Primary Care Provider: [name] Other Clinician: Referring Provider: Treating Provider/Extender: [name] in Treatment: 4 Verbal / Phone Orders: No Diagnosis Coding ICD-10 Coding Code Description [phone] Non-pressure chronic ulcer of left ankle with fat layer exposed E11.10 Type 2 diabetes mellitus with ketoacidosis without coma E11.622 Type 2 diabetes mellitus with other skin ulcer Discharge From [facility] Services Discharge from [facility]!!!!!!!!! Anesthetic (In clinic) Topical Lidocaine 4% applied to wound bed Additional Orders / Instructions Other: - protect wound with pad for the next week Patient Medications llergies: Documentation for allergies has not been submitted A Notifications Medication Indication Start End 01/14/2022 lidocaine DOSE topical 4 % cream - cream topical [name], [name] ([id number]) [id number] Page 3 of 7 Electronic Signature(s) Signed: 01/14/2022 1:05:44 PM By: Fredirick Maudlin MD FACS Entered By: Fredirick Maudlin on 01/14/2022  13:05:44 -------------------------------------------------------------------------------- Problem List Details Patient Name: Date of Service: [name] 01/14/2022 12:45 PM Medical Record Number: [id number] Patient Account Number: [id number] Date of Birth/Sex: Treating RN: [birth year] (46 y.o. [name] Primary Care Provider: [name] Other Clinician: Referring Provider: Treating Provider/Extender: [name] in Treatment: 4 Active Problems ICD-10 Encounter Code Description Active Date MDM Diagnosis [phone] Non-pressure chronic ulcer of left ankle with fat layer exposed 12/11/2021 No Yes E11.10 Type 2 diabetes mellitus with ketoacidosis without coma 12/11/2021 No Yes E11.622 Type 2 diabetes mellitus with other skin ulcer 12/11/2021 No Yes Inactive Problems Resolved Problems Electronic Signature(s) Signed: 01/14/2022 12:49:56 PM By: Fredirick Maudlin MD FACS Entered By: Fredirick Maudlin on 01/14/2022 12:49:56 -------------------------------------------------------------------------------- Progress Note Details Patient Name: Date of Service: [name] 01/14/2022 12:45 PM Medical Record Number: [id number] Patient Account Number: [id number] Date of Birth/Sex: Treating RN: 27-May-1975 (46 y.o. [name] Primary Care Provider: [name] Other Clinician: Referring Provider: Treating Provider/Extender: [name] in Treatment: 4 Subjective Chief Complaint Information obtained from Patient Patient seen for complaints of Non-Healing Wound. History of Present Illness (HPI) [name], [name] ([id number]) [id number] Page 4 of 7 ADMISSION 12/11/2021 History is somewhat limited secondary to intellectual delay. Some of the history has been garnered from review of the electronic medical record. The patient reports that she was sitting on pavement with her  left foot tucked underneath her. She says that she developed a wound on that site. She saw her PCP. They note from that clinic date (11/13/2021: Says that the wound was open but dried without any drainage. Left foot edema was described. A 7-day course of Keflex was prescribed. She is followed by podiatry for routine diabetic foot care. When she saw them on August 15, the provider referred her to a different podiatrist who debrided the wound. He applied Iodosorb and prescribed Silvadene daily dressing changes. The patient states that she has not been changing her dressing. She apparently resides in some sort of facility and she says there are nurses and doctors there. Specific details are difficult to sort out. She denies any significant pain to the wound. On her left lateral malleolus, there is a circular wound that does seem consistent with the history provided. There is some slough  buildup in the center of the wound. No fluctuance, odor, or purulent drainage. 12/16/2021: The wound is about the same size today but the surface is quite a bit cleaner. 12/23/2021: The wound continues to contract. Has developed some undermining but this appears to be secondary to some perimeter eschar. No concern for infection. 12/30/2021: The area of undermining has broken down and she now has 3 small areas that are open. They all connect under the thin bands of skin. Her drainage has decreased. There is a little bit of slough and eschar accumulation. 01/06/2022: The wound looks a bit shallower today; I removed the skin that was causing the undermining at her last visit with me. There is a little bit of slough overlying good granulation tissue at the more distal aspect of the wound. The more proximal aspect is a bit pale and fibrous. 01/14/2022: Her wound has healed. Patient History Unable to Obtain Patient History due to Altered Mental Status. Information obtained from Patient. Family History Diabetes - Mother,Father,  Stroke - Siblings. Social History Never smoker, Marital Status - Single, Alcohol Use - Never, Drug Use - No History, Caffeine Use - Daily. Medical History Cardiovascular Patient has history of Hypertension Endocrine Patient has history of Type II Diabetes Psychiatric Denies history of Anorexia/bulimia, Confinement Anxiety Hospitalization/Surgery History - Cholecystectomy. - pelvic mass. - ventral hernia/ hernia repair. - Hand surgery (right hand). Medical A Surgical History Notes nd Gastrointestinal GERD Endocrine DKA Giant ovarian mass Objective Constitutional Slightly tachycardic, asymptomatic. No acute distress.. Vitals Time Taken: 12:42 PM, Height: 58 in, Weight: 111 lbs, BMI: 23.2, Temperature: 98.1 F, Pulse: 103 bpm, Respiratory Rate: 16 breaths/min, Blood Pressure: 105/66 mmHg. Respiratory Normal work of breathing on room air.. General Notes: 01/14/2022: Her wound is healed. Integumentary (Hair, Skin) Wound #1 status is Open. Original cause of wound was Laceration. The date acquired was: 10/24/2021. The wound has been in treatment 4 weeks. The wound is located on the Left,Lateral Ankle. The wound measures 0cm length x 0cm width x 0cm depth; 0cm^2 area and 0cm^3 volume. There is no tunneling or undermining noted. There is a none present amount of drainage noted. The wound margin is flat and intact. There is no granulation within the wound bed. There is no necrotic tissue within the wound bed. The periwound skin appearance had no abnormalities noted for texture. The periwound skin appearance had no abnormalities noted for moisture. The periwound skin appearance had no abnormalities noted for color. Periwound temperature was noted as No Abnormality. Assessment [name], [name] ([id number]) [id number] Page 5 of 7 Active Problems ICD-10 Non-pressure chronic ulcer of left ankle with fat layer exposed Type 2 diabetes mellitus with ketoacidosis  without coma Type 2 diabetes mellitus with other skin ulcer Plan Discharge From [facility] Services: Discharge from [facility]!!!!!!!!! Anesthetic: (In clinic) Topical Lidocaine 4% applied to wound bed Additional Orders / Instructions: Other: - protect wound with pad for the next week The following medication(s) was prescribed: lidocaine topical 4 % cream cream topical was prescribed at facility 01/14/2022: Her wound is healed. I did recommend that she continue to keep the area protected and padded for another week to avoid reopening the freshly healed site. We will discharge her from the wound care center and she may follow-up as needed. Electronic Signature(s) Signed: 01/14/2022 1:06:16 PM By: Fredirick Maudlin MD FACS Entered By: Fredirick Maudlin on 01/14/2022 13:06:16 -------------------------------------------------------------------------------- HxROS Details Patient Name: Date of Service: [name] Watt Climes [name] 01/14/2022 12:45 PM Medical Record Number:  [id number] Patient Account Number: [id number] Date of Birth/Sex: Treating RN: 03-07-1976 (46 y.o. [name] Primary Care Provider: [name] Other Clinician: Referring Provider: Treating Provider/Extender: [name] in Treatment: 4 Unable to Obtain Patient History due to Altered Mental Status Information Obtained From Patient Cardiovascular Medical History: Positive for: Hypertension Gastrointestinal Medical History: Past Medical History Notes: GERD Endocrine Medical History: Positive for: Type II Diabetes Past Medical History Notes: DKA Giant ovarian mass Treated with: Insulin, Oral agents Blood sugar tested every day: Yes Tested : 2 Psychiatric [name], [name] ([id number]) [id number] Page 6 of 7 Medical History: Negative for: Anorexia/bulimia; Confinement Anxiety Immunizations Pneumococcal Vaccine: Received  Pneumococcal Vaccination: Yes Received Pneumococcal Vaccination On or After 60th Birthday: No Implantable Devices No devices added Hospitalization / Surgery History Type of Hospitalization/Surgery Cholecystectomy pelvic mass ventral hernia/ hernia repair Hand surgery (right hand) Family and Social History Diabetes: Yes - Mother,Father; Stroke: Yes - Siblings; Never smoker; Marital Status - Single; Alcohol Use: Never; Drug Use: No History; Caffeine Use: Daily; Financial Concerns: No; Food, Clothing or Shelter Needs: No; Transportation Concerns: No Engineer, maintenance) Signed: 01/14/2022 1:54:01 PM By: Fredirick Maudlin MD FACS Signed: 01/14/2022 4:02:37 PM By: Sabas Sous By: Fredirick Maudlin on 01/14/2022 12:59:54 -------------------------------------------------------------------------------- [place] Details Patient Name: Date of Service: [name] 01/14/2022 Medical Record Number: [id number] Patient Account Number: [id number] Date of Birth/Sex: Treating RN: 19-Jul-1975 (46 y.o. [name] Primary Care Provider: [name] Other Clinician: Referring Provider: Treating Provider/Extender: [name] in Treatment: 4 Diagnosis Coding ICD-10 Codes Code Description [phone] Non-pressure chronic ulcer of left ankle with fat layer exposed E11.10 Type 2 diabetes mellitus with ketoacidosis without coma E11.622 Type 2 diabetes mellitus with other skin ulcer Facility Procedures : CPT4 Code: [id number] Description: 99213 - WOUND CARE VISIT-LEV 3 EST PT Modifier: Quantity: 1 Physician Procedures : CPT4 Code Description Modifier [id number] - WC PHYS LEVEL 3 - EST PT ICD-10 Diagnosis Description F35.456 Non-pressure chronic ulcer of left ankle with fat layer exposed E11.622 Type 2 diabetes mellitus with other skin ulcer E11.10 Type 2 diabetes  mellitus with ketoacidosis without coma Quantity: 1 Electronic  Signature(s) Signed: 01/14/2022 1:54:01 PM By: Fredirick Maudlin MD FACS Signed: 01/14/2022 4:02:37 PM By: Adline Peals Previous Signature: 01/14/2022 1:06:28 PM Version By: Fredirick Maudlin MD [name] ([id number]) [id number] Page 7 of 7 Entered By: Adline Peals on 01/14/2022 13:20:42

## 2022-01-16 ENCOUNTER — Encounter (HOSPITAL_COMMUNITY): Payer: Self-pay

## 2022-01-16 ENCOUNTER — Other Ambulatory Visit: Payer: Self-pay

## 2022-01-16 ENCOUNTER — Emergency Department (HOSPITAL_COMMUNITY)
Admission: EM | Admit: 2022-01-16 | Discharge: 2022-01-16 | Disposition: A | Discharge status: Home / Self Care | Payer: Medicaid Other | Attending: Emergency Medicine | Admitting: Emergency Medicine

## 2022-01-16 DIAGNOSIS — E11649 Type 2 diabetes mellitus with hypoglycemia without coma: Secondary | ICD-10-CM | POA: Insufficient documentation

## 2022-01-16 DIAGNOSIS — E162 Hypoglycemia, unspecified: Secondary | ICD-10-CM

## 2022-01-16 DIAGNOSIS — R42 Dizziness and giddiness: Secondary | ICD-10-CM | POA: Diagnosis present

## 2022-01-16 DIAGNOSIS — Z7982 Long term (current) use of aspirin: Secondary | ICD-10-CM | POA: Insufficient documentation

## 2022-01-16 DIAGNOSIS — T68XXXA Hypothermia, initial encounter: Secondary | ICD-10-CM | POA: Diagnosis not present

## 2022-01-16 DIAGNOSIS — I10 Essential (primary) hypertension: Secondary | ICD-10-CM | POA: Insufficient documentation

## 2022-01-16 DIAGNOSIS — Z7984 Long term (current) use of oral hypoglycemic drugs: Secondary | ICD-10-CM | POA: Diagnosis not present

## 2022-01-16 DIAGNOSIS — X31XXXA Exposure to excessive natural cold, initial encounter: Secondary | ICD-10-CM | POA: Insufficient documentation

## 2022-01-16 DIAGNOSIS — Z794 Long term (current) use of insulin: Secondary | ICD-10-CM | POA: Diagnosis not present

## 2022-01-16 LAB — CBC WITH DIFFERENTIAL/PLATELET
Abs Immature Granulocytes: 0.04 10*3/uL (ref 0.00–0.07)
Basophils Absolute: 0 10*3/uL (ref 0.0–0.1)
Basophils Relative: 0 %
Eosinophils Absolute: 0.1 10*3/uL (ref 0.0–0.5)
Eosinophils Relative: 1 %
HCT: 29.8 % — ABNORMAL LOW (ref 36.0–46.0)
Hemoglobin: 8.8 g/dL — ABNORMAL LOW (ref 12.0–15.0)
Immature Granulocytes: 1 %
Lymphocytes Relative: 14 %
Lymphs Abs: 1 10*3/uL (ref 0.7–4.0)
MCH: 21.6 pg — ABNORMAL LOW (ref 26.0–34.0)
MCHC: 29.5 g/dL — ABNORMAL LOW (ref 30.0–36.0)
MCV: 73 fL — ABNORMAL LOW (ref 80.0–100.0)
Monocytes Absolute: 0.4 10*3/uL (ref 0.1–1.0)
Monocytes Relative: 5 %
Neutro Abs: 5.9 10*3/uL (ref 1.7–7.7)
Neutrophils Relative %: 79 %
Platelets: 506 10*3/uL — ABNORMAL HIGH (ref 150–400)
RBC: 4.08 MIL/uL (ref 3.87–5.11)
RDW: 18.3 % — ABNORMAL HIGH (ref 11.5–15.5)
WBC: 7.4 10*3/uL (ref 4.0–10.5)
nRBC: 0 % (ref 0.0–0.2)

## 2022-01-16 LAB — BASIC METABOLIC PANEL
Anion gap: 8 (ref 5–15)
BUN: 20 mg/dL (ref 6–20)
CO2: 21 mmol/L — ABNORMAL LOW (ref 22–32)
Calcium: 8.5 mg/dL — ABNORMAL LOW (ref 8.9–10.3)
Chloride: 105 mmol/L (ref 98–111)
Creatinine, Ser: 0.57 mg/dL (ref 0.44–1.00)
GFR, Estimated: >60 mL/min (ref >60)
Glucose, Bld: 139 mg/dL — ABNORMAL HIGH (ref 70–99)
Potassium: 3.9 mmol/L (ref 3.5–5.1)
Sodium: 134 mmol/L — ABNORMAL LOW (ref 135–145)

## 2022-01-16 LAB — CBG MONITORING, ED
Glucose-Capillary: 80 mg/dL (ref 70–99)
Glucose-Capillary: <10 mg/dL — CL (ref 70–99)
Glucose-Capillary: 187 mg/dL — ABNORMAL HIGH (ref 70–99)

## 2022-01-16 NOTE — Discharge Instructions (Signed)
You were seen today for hyperglycemia, or low blood sugar.  Please take medication as prescribed and be sure to eat meals as recommended by your primary care provider.  If you develop any life-threatening condition such as altered level of consciousness, shortness of breath, or chest pain, please return to the emergency department for reevaluation

## 2022-01-16 NOTE — ED Triage Notes (Signed)
Woke up this am with cbg 42-47  Complains of dizziness.  Reports has been taking meds.  Hasnt had any DM meds today.  Ate last night.  EMS fed her crackers and oral glucose.

## 2022-01-16 NOTE — ED Notes (Signed)
Bair hugger placed to rise temp. Provider notified.

## 2022-01-16 NOTE — ED Notes (Signed)
EMS allowed patient to go to bathroom prior to rooming patient and patient unable to provide specimen at this time.

## 2022-01-16 NOTE — ED Provider Notes (Signed)
[facility] DEPT Provider Note   CSN: [id number] Arrival date & time: 01/16/22  5784     History  Chief Complaint  Patient presents with   Hypoglycemia    [name] is a 46 y.o. female.  Patient presents to the hospital via EMS complaining of hyperglycemia.  Patient reportedly woke up this morning feeling dizzy and had a blood glucose reading of 42.  She rechecked her blood sugar and it was 47.  EMS on scene gave the patient crackers and oral glucose but did not recheck blood glucose prior to arrival at the emergency department.  Patient denies having any of her diabetic medications today but does report taking all of her medications as prescribed yesterday.  She reports that she ate last night but has not had breakfast yet today.  Patient denies abdominal pain, nausea, vomiting, dysuria, chest pain, shortness of breath.  Endorses feeling weak/dizzy.  Past medical history significant for type II DM, GERD, intellectual disability, hypertension, hypercholesterolemia, DiGeorge syndrome  HPI     Home Medications Prior to Admission medications   Medication Sig Start Date End Date Taking? Authorizing Provider  Insulin Syringe-Needle U-100 30G X 5/16" 0.5 ML MISC Use to administer insulin 2 (two) times daily. 07/03/21   Elsie Stain, MD  Accu-Chek Softclix Lancets lancets Use 3 (three) times daily as directed 08/08/21   Elsie Stain, MD  Acetaminophen 500 MG capsule Take 500-1,000 mg by mouth every 6 (six) hours as needed for pain.    [provider]  ASPIRIN LOW DOSE 81 MG EC tablet Take 1 tablet (81 mg total) by mouth daily. 07/24/21   Elsie Stain, MD  Blood Glucose Monitoring Suppl (ACCU-CHEK GUIDE) w/Device KIT Use to check blood sugar 3 (three) times daily. 07/03/21   Elsie Stain, MD  EASY COMFORT PEN NEEDLES 31G X 5 MM MISC USE TWICE A DAY WITH INSULIN PEN 12/30/17   [provider]  fluticasone (FLONASE) 50  MCG/ACT nasal spray Place 1 spray into both nostrils daily. Patient taking differently: Place 1 spray into both nostrils daily as needed for allergies or rhinitis. 08/01/21   Elodia Florence., MD  glucose blood (ACCU-CHEK GUIDE) test strip Use 3 (three) times daily as instructed 07/03/21   Elsie Stain, MD  insulin glargine (LANTUS) 100 UNIT/ML injection Inject 0.3 mLs (30 Units total) into the skin in the morning AND 0.2 mLs (20 Units total) every evening. Patient taking differently: Inject 40 units into the skin in the morning and 30 units at bedtime 07/03/21   Elsie Stain, MD  Insulin Syringe-Needle U-100 (INSULIN SYRINGE .5CC/31GX5/16") 31G X 5/16" 0.5 ML MISC Inject 1 Syringe into the skin daily. 09/18/21   Elsie Stain, MD  Lancets Misc. (ACCU-CHEK SOFTCLIX LANCET DEV) KIT Use to check blood sugar twice a day 07/24/21   Elsie Stain, MD  metFORMIN (GLUCOPHAGE) 500 MG tablet Take 1 tablet (500 mg total) by mouth 2 (two) times daily with a meal. 07/24/21   Elsie Stain, MD  omeprazole (PRILOSEC) 20 MG capsule Take 1 capsule (20 mg total) by mouth daily. Patient taking differently: Take 20 mg by mouth daily before breakfast. 06/26/17   Avie Echevaria B, PA-C  ondansetron (ZOFRAN) 4 MG tablet Take 1 tablet (4 mg total) by mouth every 6 (six) hours. Patient not taking: Reported on 12/17/2021 02/07/17   Nat Christen, MD  pravastatin (PRAVACHOL) 20 MG tablet Take 20 mg by mouth  daily.    [provider]  silver sulfADIAZINE (SILVADENE) 1 % cream Apply 1 application topically to wound on both ankles once daily. Patient not taking: Reported on 12/17/2021 07/03/21   Elsie Stain, MD      Allergies    Patient has no known allergies.    Review of Systems   Review of Systems  Constitutional:  Negative for fever.  Respiratory:  Negative for shortness of breath.   Cardiovascular:  Negative for chest pain.  Gastrointestinal:  Negative for abdominal pain, nausea and  vomiting.  Genitourinary:  Negative for dysuria.  Neurological:  Positive for weakness.    Physical Exam Updated Vital Signs BP 107/66 (BP Location: Left Arm)   Pulse 88   Temp (!) 97.4 F (36.3 C) (Oral)   Resp 16   Ht _0  (1.448 m)   Wt 44.9 kg   LMP 01/13/2022   SpO2 100%   BMI 21.42 kg/m  Physical Exam Vitals and nursing note reviewed.  Constitutional:      General: She is not in acute distress.    Appearance: She is well-developed.  HENT:     Head: Normocephalic and atraumatic.     Mouth/Throat:     Mouth: Mucous membranes are moist.  Eyes:     Conjunctiva/sclera: Conjunctivae normal.  Cardiovascular:     Rate and Rhythm: Normal rate and regular rhythm.     Heart sounds: No murmur heard. Pulmonary:     Effort: Pulmonary effort is normal. No respiratory distress.     Breath sounds: Normal breath sounds.  Abdominal:     Palpations: Abdomen is soft.     Tenderness: There is no abdominal tenderness.  Musculoskeletal:        General: No swelling.     Cervical back: Neck supple.  Skin:    General: Skin is warm and dry.     Capillary Refill: Capillary refill takes less than 2 seconds.  Neurological:     Mental Status: She is alert.  Psychiatric:        Mood and Affect: Mood normal.     ED Results / Procedures / Treatments   Labs (all labs ordered are listed, but only abnormal results are displayed) Labs Reviewed  BASIC METABOLIC PANEL - Abnormal; Notable for the following components:      Result Value   Sodium 134 (*)    CO2 21 (*)    Glucose, Bld 139 (*)    Calcium 8.5 (*)    All other components within normal limits  CBC WITH DIFFERENTIAL/PLATELET - Abnormal; Notable for the following components:   Hemoglobin 8.8 (*)    HCT 29.8 (*)    MCV 73.0 (*)    MCH 21.6 (*)    MCHC 29.5 (*)    RDW 18.3 (*)    Platelets 506 (*)    All other components within normal limits  CBG MONITORING, ED - Abnormal; Notable for the following components:    Glucose-Capillary <10 (*)    All other components within normal limits  CBG MONITORING, ED - Abnormal; Notable for the following components:   Glucose-Capillary 187 (*)    All other components within normal limits  URINALYSIS, ROUTINE W REFLEX MICROSCOPIC  CBG MONITORING, ED    EKG None  Radiology No results found.  Procedures Procedures    Medications Ordered in ED Medications - No data to display  ED Course/ Medical Decision Making/ A&P Clinical Course as of 01/16/22 0950  Thu  Jan 16, 2022  0925 Stable 60 YOF with IDD. Hypothermic and hypoglycemic this morning. She feels fine, ambulatory and tolerating PO intake [CC]    Clinical Course User Index [CC] Tretha Sciara, MD                           Medical Decision Making Amount and/or Complexity of Data Reviewed Labs: ordered.   This patient presents to the ED for concern of hypoglycemia, this involves an extensive number of treatment options, and is a complaint that carries with it a high risk of complications and morbidity.  The differential diagnosis includes inadequate nutrition, medication error, infection, and others   Co morbidities that complicate the patient evaluation  History of type 2 diabetes   Additional history obtained:  Additional history obtained from EMS External records from outside source obtained and reviewed including emergency department notes from 2 days ago where she was evaluated for hyperglycemia and epistaxis   Lab Tests:  I Ordered, and personally interpreted labs.  The pertinent results include: Repeat CBG 187, CBC with hemoglobin of 8.8 (consistent with patient's baseline), unremarkable BMP    Test / Admission - Considered:  The patient was reportedly outside upon EMS arrival.  Question of the patient's initial blood glucose readings may have been due to temperature.  Patient is now much warmer with last oral temp being 97.4 F.  Her glucose readings have been normal with  her BMP showing a glucose of 139.  There is no indication at this time for admission.  The patient is able to eat and drink without difficulty.  She may discharge home at this time.        Final Clinical Impression(s) / ED Diagnoses Final diagnoses:  Hypoglycemia  Hypothermia, initial encounter    Rx / DC Orders ED Discharge Orders     None         Ronny Bacon 01/16/22 [id number]    Tretha Sciara, MD 01/16/22 1409

## 2022-01-29 ENCOUNTER — Encounter (HOSPITAL_BASED_OUTPATIENT_CLINIC_OR_DEPARTMENT_OTHER): Payer: Medicaid Other | Admitting: General Surgery

## 2022-01-29 DIAGNOSIS — E11622 Type 2 diabetes mellitus with other skin ulcer: Secondary | ICD-10-CM | POA: Diagnosis not present

## 2022-01-29 NOTE — Progress Notes (Addendum)
LIVI, MCGANN ([id number]) [id number] Page 1 of 7 Visit Report for 01/29/2022 Chief Complaint Document Details Patient Name: Date of Service: [name] 01/29/2022 1:15 PM Medical Record Number: [id number] Patient Account Number: [id number] Date of Birth/Sex: Treating RN: [birth year] (46 y.o. F) Primary Care Provider: [name] Other Clinician: Referring Provider: Treating Provider/Extender: [name] in Treatment: 7 Information Obtained from: Patient Chief Complaint Patient seen for complaints of Non-Healing Wound. Electronic Signature(s) Signed: 01/29/2022 1:01:43 PM By: Fredirick Maudlin MD FACS Entered By: Fredirick Maudlin on 01/29/2022 13:01:42 -------------------------------------------------------------------------------- HPI Details Patient Name: Date of Service: [name] 01/29/2022 1:15 PM Medical Record Number: [id number] Patient Account Number: [id number] Date of Birth/Sex: Treating RN: May 31, 1975 (46 y.o. F) Primary Care Provider: [name] Other Clinician: Referring Provider: Treating Provider/Extender: [name] in Treatment: 7 History of Present Illness HPI Description: ADMISSION 12/11/2021 History is somewhat limited secondary to intellectual delay. Some of the history has been garnered from review of the electronic medical record. The patient reports that she was sitting on pavement with her left foot tucked underneath her. She says that she developed a wound on that site. She saw her PCP. They note from that clinic date (11/13/2021: Says that the wound was open but dried without any drainage. Left foot edema was described. A 7-day course of Keflex was prescribed. She is followed by podiatry for routine diabetic foot care. When she saw them on August 15, the provider referred her to a different podiatrist who debrided the wound. He applied  Iodosorb and prescribed Silvadene daily dressing changes. The patient states that she has not been changing her dressing. She apparently resides in some sort of facility and she says there are nurses and doctors there. Specific details are difficult to sort out. She denies any significant pain to the wound. On her left lateral malleolus, there is a circular wound that does seem consistent with the history provided. There is some slough buildup in the center of the wound. No fluctuance, odor, or purulent drainage. 12/16/2021: The wound is about the same size today but the surface is quite a bit cleaner. 12/23/2021: The wound continues to contract. Has developed some undermining but this appears to be secondary to some perimeter eschar. No concern for infection. 12/30/2021: The area of undermining has broken down and she now has 3 small areas that are open. They all connect under the thin bands of skin. Her drainage has decreased. There is a little bit of slough and eschar accumulation. 01/06/2022: The wound looks a bit shallower today; I removed the skin that was causing the undermining at her last visit with me. There is a little bit of slough overlying good granulation tissue at the more distal aspect of the wound. The more proximal aspect is a bit pale and fibrous. 01/14/2022: Her wound has healed. 01/29/2022: The patient returns to clinic today because she was concerned that her wound had reopened. On intake, however, it was noted that she had a thick buildup of Iodosorb on her skin; apparently she had been applying it despite her wound being healed. There is no open wound today. [name], [name] ([id number]) [id number] Page 2 of 7 Electronic Signature(s) Signed: 01/29/2022 1:02:18 PM By: Fredirick Maudlin MD FACS Entered By: Fredirick Maudlin on 01/29/2022 13:02:18 -------------------------------------------------------------------------------- Physical Exam  Details Patient Name: Date of Service: [name] 01/29/2022 1:15 PM Medical Record Number: [id number] Patient Account Number: [id number] Date of Birth/Sex: Treating  RN: 1975-05-21 (46 y.o. F) Primary Care Provider: [name] Other Clinician: Referring Provider: Treating Provider/Extender: [name] in Treatment: 7 Constitutional . Slightly tachycardic, asymptomatic. . . No acute distress.Marland Kitchen Respiratory Normal work of breathing on room air.. Notes 01/29/2022: Despite her concerns, the wound is not actually open. Electronic Signature(s) Signed: 01/29/2022 1:03:03 PM By: Fredirick Maudlin MD FACS Entered By: Fredirick Maudlin on 01/29/2022 13:03:03 -------------------------------------------------------------------------------- Physician Orders Details Patient Name: Date of Service: [name] 01/29/2022 1:15 PM Medical Record Number: [id number] Patient Account Number: [id number] Date of Birth/Sex: Treating RN: [birth year] (46 y.o. [name] Primary Care Provider: [name] Other Clinician: Referring Provider: Treating Provider/Extender: [name] in Treatment: 7 Verbal / Phone Orders: No Diagnosis Coding ICD-10 Coding Code Description [phone] Non-pressure chronic ulcer of left ankle with fat layer exposed E11.10 Type 2 diabetes mellitus with ketoacidosis without coma E11.622 Type 2 diabetes mellitus with other skin ulcer Discharge From [facility] Discharge from [place]!!!!!!!!! Anesthetic (In clinic) Topical Lidocaine 4% applied to wound bed Additional Orders / Instructions Other: - protect wound with pad for the next week Electronic Signature(s) Signed: 01/29/2022 1:03:11 PM By: Fredirick Maudlin MD FACS Entered By: Fredirick Maudlin on 01/29/2022 13:03:11 [name] ([id number]) [id number] Page 3 of  7 -------------------------------------------------------------------------------- Problem List Details Patient Name: Date of Service: [name] 01/29/2022 1:15 PM Medical Record Number: [id number] Patient Account Number: [id number] Date of Birth/Sex: Treating RN: Aug 19, 1975 (46 y.o. F) Primary Care Provider: [name] Other Clinician: Referring Provider: Treating Provider/Extender: [name] in Treatment: 7 Active Problems ICD-10 Encounter Code Description Active Date MDM Diagnosis L97.322 Non-pressure chronic ulcer of left ankle with fat layer exposed 12/11/2021 No Yes E11.10 Type 2 diabetes mellitus with ketoacidosis without coma 12/11/2021 No Yes E11.622 Type 2 diabetes mellitus with other skin ulcer 12/11/2021 No Yes Inactive Problems Resolved Problems Electronic Signature(s) Signed: 01/29/2022 1:01:31 PM By: Fredirick Maudlin MD FACS Entered By: Fredirick Maudlin on 01/29/2022 13:01:31 -------------------------------------------------------------------------------- Progress Note Details Patient Name: Date of Service: [name] 01/29/2022 1:15 PM Medical Record Number: [id number] Patient Account Number: [id number] Date of Birth/Sex: Treating RN: Nov 25, 1975 (46 y.o. F) Primary Care Provider: [name] Other Clinician: Referring Provider: Treating Provider/Extender: [name] in Treatment: 7 Subjective Chief Complaint Information obtained from Patient Patient seen for complaints of Non-Healing Wound. History of Present Illness (HPI) ADMISSION 12/11/2021 History is somewhat limited secondary to intellectual delay. Some of the history has been garnered from review of the electronic medical record. The patient reports that she was sitting on pavement with her left foot tucked underneath her. She says that she developed a wound on that site. She saw her PCP. They note from that clinic date  (11/13/2021: Says that the wound was open but dried without any drainage. Left foot edema was described. A 7-day course of Keflex was prescribed. She is followed by podiatry for routine diabetic foot care. When she saw them on August 15, the provider referred her to a different podiatrist who debrided the wound. He applied Iodosorb and prescribed Silvadene daily dressing changes. The patient states that she has not been changing her dressing. [name], [name] ([id number]) [id number] Page 4 of 7 She apparently resides in some sort of facility and she says there are nurses and doctors there. Specific details are difficult to sort out. She denies any significant pain to the wound. On her left  lateral malleolus, there is a circular wound that does seem consistent with the history provided. There is some slough buildup in the center of the wound. No fluctuance, odor, or purulent drainage. 12/16/2021: The wound is about the same size today but the surface is quite a bit cleaner. 12/23/2021: The wound continues to contract. Has developed some undermining but this appears to be secondary to some perimeter eschar. No concern for infection. 12/30/2021: The area of undermining has broken down and she now has 3 small areas that are open. They all connect under the thin bands of skin. Her drainage has decreased. There is a little bit of slough and eschar accumulation. 01/06/2022: The wound looks a bit shallower today; I removed the skin that was causing the undermining at her last visit with me. There is a little bit of slough overlying good granulation tissue at the more distal aspect of the wound. The more proximal aspect is a bit pale and fibrous. 01/14/2022: Her wound has healed. 01/29/2022: The patient returns to clinic today because she was concerned that her wound had reopened. On intake, however, it was noted that she had a thick buildup of Iodosorb on her skin; apparently she  had been applying it despite her wound being healed. There is no open wound today. Patient History Unable to Obtain Patient History due to Altered Mental Status. Information obtained from Patient. Family History Diabetes - Mother,Father, Stroke - Siblings. Social History Never smoker, Marital Status - Single, Alcohol Use - Never, Drug Use - No History, Caffeine Use - Daily. Medical History Cardiovascular Patient has history of Hypertension Endocrine Patient has history of Type II Diabetes Psychiatric Denies history of Anorexia/bulimia, Confinement Anxiety Hospitalization/Surgery History - Cholecystectomy. - pelvic mass. - ventral hernia/ hernia repair. - Hand surgery (right hand). Medical A Surgical History Notes nd Gastrointestinal GERD Endocrine DKA Giant ovarian mass Objective Constitutional Slightly tachycardic, asymptomatic. No acute distress.. Vitals Time Taken: 12:53 PM, Height: 58 in, Weight: 111 lbs, BMI: 23.2, Temperature: 98.6 F, Pulse: 103 bpm, Respiratory Rate: 18 breaths/min, Blood Pressure: 121/58 mmHg. Respiratory Normal work of breathing on room air.. General Notes: 01/29/2022: Despite her concerns, the wound is not actually open. Integumentary (Hair, Skin) Wound #1 status is Healed - Epithelialized. Original cause of wound was Laceration. The date acquired was: 10/24/2021. The wound has been in treatment 7 weeks. The wound is located on the Left,Lateral Ankle. The wound measures 0cm length x 0cm width x 0cm depth; 0cm^2 area and 0cm^3 volume. There is no tunneling or undermining noted. There is a none present amount of drainage noted. The wound margin is flat and intact. There is no granulation within the wound bed. There is no necrotic tissue within the wound bed. The periwound skin appearance had no abnormalities noted for texture. The periwound skin appearance had no abnormalities noted for moisture. The periwound skin appearance had no abnormalities noted for  color. Periwound temperature was noted as No Abnormality. Wound #1 status is Healed - Epithelialized. Original cause of wound was Laceration. The date acquired was: 10/24/2021. The wound has been in treatment 7 weeks. The wound is located on the Left,Lateral Ankle. The wound measures 0cm length x 0cm width x 0cm depth; 0cm^2 area and 0cm^3 volume. There is no tunneling or undermining noted. There is a none present amount of drainage noted. The wound margin is flat and intact. There is no granulation within the wound bed. There is no necrotic tissue within the wound bed. The periwound skin appearance  had no abnormalities noted for texture. The periwound skin appearance had no abnormalities noted for moisture. The periwound skin appearance had no abnormalities noted for color. Periwound temperature was noted as No Abnormality. [name], [name] ([id number]) [id number] Page 5 of 7 Assessment Active Problems ICD-10 Non-pressure chronic ulcer of left ankle with fat layer exposed Type 2 diabetes mellitus with ketoacidosis without coma Type 2 diabetes mellitus with other skin ulcer Plan Discharge From [facility] Services: Discharge from [place]!!!!!!!!! Anesthetic: (In clinic) Topical Lidocaine 4% applied to wound bed Additional Orders / Instructions: Other: - protect wound with pad for the next week 01/29/2022: The patient returns to clinic today because she was concerned that her wound had reopened. It actually had not but she had accumulated a thick layer of Iodosorb over the closed wound surface. She was reassured that her wound remained healed and that she did not need to continue to apply Iodosorb. We will see her as needed. Electronic Signature(s) Signed: 01/29/2022 1:03:47 PM By: Fredirick Maudlin MD FACS Entered By: Fredirick Maudlin on 01/29/2022  13:03:47 -------------------------------------------------------------------------------- HxROS Details Patient Name: Date of Service: [name] 01/29/2022 1:15 PM Medical Record Number: [id number] Patient Account Number: [id number] Date of Birth/Sex: Treating RN: [birth year] (46 y.o. F) Primary Care Provider: [name] Other Clinician: Referring Provider: Treating Provider/Extender: [name] in Treatment: 7 Unable to Obtain Patient History due to Altered Mental Status Information Obtained From Patient Cardiovascular Medical History: Positive for: Hypertension Gastrointestinal Medical History: Past Medical History Notes: GERD Endocrine Medical History: Positive for: Type II Diabetes Past Medical History Notes: DKA Giant ovarian mass Treated with: Insulin, Oral agents Blood sugar tested every day: Yes Tested : 2 [name], [name] ([id number]) [id number] Page 6 of 7 Psychiatric Medical History: Negative for: Anorexia/bulimia; Confinement Anxiety Immunizations Pneumococcal Vaccine: Received Pneumococcal Vaccination: Yes Received Pneumococcal Vaccination On or After 60th Birthday: No Implantable Devices No devices added Hospitalization / Surgery History Type of Hospitalization/Surgery Cholecystectomy pelvic mass ventral hernia/ hernia repair Hand surgery (right hand) Family and Social History Diabetes: Yes - Mother,Father; Stroke: Yes - Siblings; Never smoker; Marital Status - Single; Alcohol Use: Never; Drug Use: No History; Caffeine Use: Daily; Financial Concerns: No; Food, Clothing or Shelter Needs: No; Transportation Concerns: No Engineer, maintenance) Signed: 01/29/2022 3:07:17 PM By: Fredirick Maudlin MD FACS Entered By: Fredirick Maudlin on 01/29/2022 13:02:23 -------------------------------------------------------------------------------- SuperBill Details Patient Name: Date of  Service: [name] 01/29/2022 Medical Record Number: [id number] Patient Account Number: [id number] Date of Birth/Sex: Treating RN: 09-Nov-1975 (46 y.o. F) Primary Care Provider: [name] Other Clinician: Referring Provider: Treating Provider/Extender: [name] in Treatment: 7 Diagnosis Coding ICD-10 Codes Code Description [phone] Non-pressure chronic ulcer of left ankle with fat layer exposed E11.10 Type 2 diabetes mellitus with ketoacidosis without coma E11.622 Type 2 diabetes mellitus with other skin ulcer Facility Procedures : CPT4 Code: [id number] Description: 99213 - WOUND CARE VISIT-LEV 3 EST PT Modifier: 25 Quantity: 1 Physician Procedures : CPT4 Code Description Modifier [id number] - WC PHYS LEVEL 2 - EST PT ICD-10 Diagnosis Description G86.761 Non-pressure chronic ulcer of left ankle with fat layer exposed E11.10 Type 2 diabetes mellitus with ketoacidosis without coma E11.622 Type 2  diabetes mellitus with other skin ulcer Quantity: 1 Electronic Signature(s) Signed: 01/29/2022 3:07:17 PM By: Fredirick Maudlin MD Michaelene Song ([id number]) [id number] Page 7 of 7 Signed: 01/29/2022 4:57:50 PM By: Blanche East RN Previous Signature: 01/29/2022 1:04:06 PM Version By: Celine Ahr  Anderson Malta MD FACS Entered By: Blanche East on 01/29/2022 14:14:11

## 2022-01-29 NOTE — Progress Notes (Signed)
Tina, Church ([id number]) [id number] Page 1 of 8 Visit Report for 01/29/2022 Arrival Information Details Patient Name: Date of Service: [name] 01/29/2022 1:15 PM Medical Record Number: [id number] Patient Account Number: [id number] Date of Birth/Sex: Treating RN: [birth year] (46 y.o. [name], [name] Primary Care Kiko Ripp: [name] Other Clinician: Referring Sibbie Flammia: Treating Lillias Difrancesco/Extender: [name] in Treatment: 7 Visit Information History Since Last Visit Added or deleted any medications: No Patient Arrived: Ambulatory Any new allergies or adverse reactions: No Arrival Time: 12:52 Had a fall or experienced change in No Accompanied By: self activities of daily living that may affect Transfer Assistance: None risk of falls: Patient Requires Transmission-Based Precautions: No Signs or symptoms of abuse/neglect since last visito No Patient Has Alerts: Yes Hospitalized since last visit: No Patient Alerts: ABI LLE 1.22 Implantable device outside of the clinic excluding No cellular tissue based products placed in the center since last visit: Has Dressing in Place as Prescribed: No Pain Present Now: No Electronic Signature(s) Signed: 01/29/2022 4:57:50 PM By: Blanche East RN Entered By: Blanche East on 01/29/2022 12:52:59 -------------------------------------------------------------------------------- Clinic Level of Care Assessment Details Patient Name: Date of Service: [name] 01/29/2022 1:15 PM Medical Record Number: [id number] Patient Account Number: [id number] Date of Birth/Sex: Treating RN: 04-29-1975 (46 y.o. [name] Primary Care Rufino Staup: [name] Other Clinician: Referring Plez Belton: Treating Taariq Leitz/Extender: [name] in Treatment: 7 Clinic Level of Care Assessment Items TOOL 4 Quantity Score X- 1 0 Use when only an  EandM is performed on FOLLOW-UP visit ASSESSMENTS - Nursing Assessment / Reassessment X- 1 10 Reassessment of Co-morbidities (includes updates in patient status) X- 1 5 Reassessment of Adherence to Treatment Plan ASSESSMENTS - Wound and Skin A ssessment / Reassessment X - Simple Wound Assessment / Reassessment - one wound 1 5 '[]'$  - 0 Complex Wound Assessment / Reassessment - multiple wounds '[]'$  - 0 Dermatologic / Skin Assessment (not related to wound area) ASSESSMENTS - Focused Assessment X- 1 5 Circumferential Edema Measurements - multi extremities '[]'$  - 0 Nutritional Assessment / Counseling / Intervention DIEM, DICOCCO ([id number]) [id number] Page 2 of 8 '[]'$  - 0 Lower Extremity Assessment (monofilament, tuning fork, pulses) '[]'$  - 0 Peripheral Arterial Disease Assessment (using hand held doppler) ASSESSMENTS - Ostomy and/or Continence Assessment and Care '[]'$  - 0 Incontinence Assessment and Management '[]'$  - 0 Ostomy Care Assessment and Management (repouching, etc.) PROCESS - Coordination of Care X - Simple Patient / Family Education for ongoing care 1 15 '[]'$  - 0 Complex (extensive) Patient / Family Education for ongoing care '[]'$  - 0 Staff obtains Programmer, systems, Records, T Results / Process Orders est '[]'$  - 0 Staff telephones HHA, Nursing Homes / Clarify orders / etc '[]'$  - 0 Routine Transfer to another Facility (non-emergent condition) '[]'$  - 0 Routine Hospital Admission (non-emergent condition) '[]'$  - 0 New Admissions / Biomedical engineer / Ordering NPWT Apligraf, etc. , '[]'$  - 0 Emergency Hospital Admission (emergent condition) X- 1 10 Simple Discharge Coordination '[]'$  - 0 Complex (extensive) Discharge Coordination PROCESS - Special Needs '[]'$  - 0 Pediatric / Minor Patient Management '[]'$  - 0 Isolation Patient Management '[]'$  - 0 Hearing / Language / Visual special needs '[]'$  - 0 Assessment of Community assistance (transportation, D/C planning,  etc.) '[]'$  - 0 Additional assistance / Altered mentation '[]'$  - 0 Support Surface(s) Assessment (bed, cushion, seat, etc.) INTERVENTIONS - Wound Cleansing / Measurement X - Simple Wound Cleansing - one wound 1 5 '[]'$  -  0 Complex Wound Cleansing - multiple wounds X- 1 5 Wound Imaging (photographs - any number of wounds) '[]'$  - 0 Wound Tracing (instead of photographs) X- 1 5 Simple Wound Measurement - one wound '[]'$  - 0 Complex Wound Measurement - multiple wounds INTERVENTIONS - Wound Dressings X - Small Wound Dressing one or multiple wounds 1 10 '[]'$  - 0 Medium Wound Dressing one or multiple wounds '[]'$  - 0 Large Wound Dressing one or multiple wounds '[]'$  - 0 Application of Medications - topical '[]'$  - 0 Application of Medications - injection INTERVENTIONS - Miscellaneous '[]'$  - 0 External ear exam '[]'$  - 0 Specimen Collection (cultures, biopsies, blood, body fluids, etc.) '[]'$  - 0 Specimen(s) / Culture(s) sent or taken to Lab for analysis '[]'$  - 0 Patient Transfer (multiple staff / Civil Service fast streamer / Similar devices) '[]'$  - 0 Simple Staple / Suture removal (25 or less) '[]'$  - 0 Complex Staple / Suture removal (26 or more) '[]'$  - 0 Hypo / Hyperglycemic Management (close monitor of Blood Glucose) NIKOLETTE, REINDL ([id number]) [id number] Page 3 of 8 '[]'$  - 0 Ankle / Brachial Index (ABI) - do not check if billed separately X- 1 5 Vital Signs Has the patient been seen at the hospital within the last three years: Yes Total Score: 80 Level Of Care: New/Established - Level 3 Electronic Signature(s) Signed: 01/29/2022 4:57:50 PM By: Blanche East RN Entered By: Blanche East on 01/29/2022 14:14:01 -------------------------------------------------------------------------------- Lower Extremity Assessment Details Patient Name: Date of Service: [name] 01/29/2022 1:15 PM Medical Record Number: [id number] Patient Account Number: [id number] Date of Birth/Sex: Treating  RN: April 17, 1975 (46 y.o. [name] Primary Care Marshell Dilauro: [name] Other Clinician: Referring Faun Mcqueen: Treating Saxon Crosby/Extender: [name] in Treatment: 7 Edema Assessment Assessed: [Left: No] [Right: No] [Left: Edema] [Right: :] Calf Left: Right: Point of Measurement: From Medial Instep 32 cm Ankle Left: Right: Point of Measurement: From Medial Instep 16 cm Vascular Assessment Pulses: Dorsalis Pedis Palpable: [Left:Yes] Electronic Signature(s) Signed: 01/29/2022 4:57:50 PM By: Blanche East RN Entered By: Blanche East on 01/29/2022 12:53:42 -------------------------------------------------------------------------------- Multi Wound Chart Details Patient Name: Date of Service: [name] 01/29/2022 1:15 PM Medical Record Number: [id number] Patient Account Number: [id number] Date of Birth/Sex: Treating RN: [birth year] (46 y.o. F) Primary Care Peytin Dechert: [name] Other Clinician: Referring Idonna Heeren: Treating Emrys Mceachron/Extender: [name] in Treatment: 7 Vital Signs Height(in): 58 Pulse(bpm): 103 Weight(lbs): 111 Blood Pressure(mmHg): 121/58 Body Mass Index(BMI): 23.2 Temperature(F): 98.6 Wittler, Alveda ([id number]) [id number] Page 4 of 8 Respiratory Rate(breaths/min): 18 [1:Photos:] [N/A:N/A] Left, Lateral Ankle N/A N/A Wound Location: Laceration N/A N/A Wounding Event: Abrasion N/A N/A Primary Etiology: Hypertension, Type II Diabetes N/A N/A Comorbid History: 10/24/2021 N/A N/A Date Acquired: 7 N/A N/A Weeks of Treatment: Open N/A N/A Wound Status: No N/A N/A Wound Recurrence: 0.1x0.1x0.1 N/A N/A Measurements L x W x D (cm) 0.008 N/A N/A A (cm) : rea 0.001 N/A N/A Volume (cm) : 98.80% N/A N/A % Reduction in A rea: 99.20% N/A N/A % Reduction in Volume: Full Thickness Without Exposed N/A N/A Classification: Support  Structures None Present N/A N/A Exudate Amount: Flat and Intact N/A N/A Wound Margin: None Present (0%) N/A N/A Granulation Amount: None Present (0%) N/A N/A Necrotic Amount: Fascia: No N/A N/A Exposed Structures: Fat Layer (Subcutaneous Tissue): No Tendon: No Muscle: No Joint: No Bone: No Large (67-100%) N/A N/A Epithelialization: Excoriation: Yes N/A N/A Periwound Skin Texture: Induration: No Callus: No Crepitus: No  Rash: No Scarring: No Maceration: Yes N/A N/A Periwound Skin Moisture: Dry/Scaly: No No Abnormalities Noted N/A N/A Periwound Skin Color: No Abnormality N/A N/A Temperature: Treatment Notes Electronic Signature(s) Signed: 01/29/2022 1:01:36 PM By: Fredirick Maudlin MD FACS Entered By: Fredirick Maudlin on 01/29/2022 13:01:36 -------------------------------------------------------------------------------- Multi-Disciplinary Care Plan Details Patient Name: Date of Service: [name]INE 01/29/2022 1:15 PM Medical Record Number: [id number] Patient Account Number: [id number] Date of Birth/Sex: Treating RN: Dec 14, 1975 (47 y.o. [name] Primary Care Aminah Zabawa: [name] Other Clinician: Referring Saphire Barnhart: Treating Zamari Vea/Extender: [name] in Treatment: [address] LANETTE, ELL ([id number]) [id number] Page 5 of 8 Electronic Signature(s) Signed: 01/29/2022 4:57:50 PM By: Blanche East RN Entered By: Blanche East on 01/29/2022 13:02:46 -------------------------------------------------------------------------------- Pain Assessment Details Patient Name: Date of Service: [name] 01/29/2022 1:15 PM Medical Record Number: [id number] Patient Account Number: [id number] Date of Birth/Sex: Treating RN: Jan 20, 1976 (46 y.o. [name] Primary Care Diana Armijo: [name] Other Clinician: Referring Kilie Rund: Treating Tayson Schnelle/Extender: [name] in Treatment: 7 Active Problems Location of Pain Severity and Description of Pain Patient Has Paino Yes Site Locations Rate the pain. Current Pain Level: 8 Character of Pain Describe the Pain: Aching, Tender Pain Management and Medication Current Pain Management: Electronic Signature(s) Signed: 01/29/2022 4:57:50 PM By: Blanche East RN Entered By: Blanche East on 01/29/2022 12:53:27 -------------------------------------------------------------------------------- Patient/Caregiver Education Details Patient Name: Date of Service: [name] 10/25/2023andnbsp1:15 PM Medical Record Number: [id number] Patient Account Number: [id number] Date of Birth/Gender: Treating RN: 1976-03-05 (46 y.o. [name] Primary Care Physician: [name] Other Clinician: Referring Physician: Treating Physician/Extender: [name] in Treatment: 7 Education Assessment Claretta Fraise ([id number]) [id number] Page 6 of 8 Education Provided To: Patient Education Topics Provided Engineer, maintenance) Signed: 01/29/2022 4:57:50 PM By: Blanche East RN Entered By: Blanche East on 01/29/2022 13:03:03 -------------------------------------------------------------------------------- Wound Assessment Details Patient Name: Date of Service: [name] 01/29/2022 1:15 PM Medical Record Number: [id number] Patient Account Number: [id number] Date of Birth/Sex: Treating RN: 1975-06-05 (46 y.o. [name], Jamie Primary Care Jnai Snellgrove: [name] Other Clinician: Referring Eean Buss: Treating Zoua Caporaso/Extender: [name] in Treatment: 7 Wound Status Wound Number: 1 Primary Etiology: Abrasion Wound Location: Left, Lateral Ankle Wound Status: Healed - Epithelialized Wounding Event: Laceration Comorbid History: Hypertension, Type II Diabetes Date Acquired:  10/24/2021 Weeks Of Treatment: 7 Clustered Wound: No Wound Measurements Length: (cm) Width: (cm) Depth: (cm) Area: (cm) Volume: (cm) 0 % Reduction in Area: 98.8% 0 % Reduction in Volume: 99.2% 0 Epithelialization: Large (67-100%) 0 Tunneling: No 0 Undermining: No Wound Description Classification: Full Thickness Without Exposed Support Wound Margin: Flat and Intact Exudate Amount: None Present Structures Foul Odor After Cleansing: No Slough/Fibrino No Wound Bed Granulation Amount: None Present (0%) Exposed Structure Necrotic Amount: None Present (0%) Fascia Exposed: No Fat Layer (Subcutaneous Tissue) Exposed: No Tendon Exposed: No Muscle Exposed: No Joint Exposed: No Bone Exposed: No Periwound Skin Texture Texture Color No Abnormalities Noted: Yes No Abnormalities Noted: Yes Moisture Temperature / Pain No Abnormalities Noted: Yes Temperature: No Abnormality Electronic Signature(s) Signed: 01/29/2022 4:57:50 PM By: Blanche East RN Entered By: Blanche East on 01/29/2022 13:02:08 Claretta Fraise ([id number]) [id number] Page 7 of 8 -------------------------------------------------------------------------------- Wound Assessment Details Patient Name: Date of Service: [name] 01/29/2022 1:15 PM Medical Record Number: [id number] Patient Account Number: [id number] Date of Birth/Sex: Treating RN: 1975-08-16 (46 y.o. F) Zochol, Butler  Primary Care Cassell Voorhies: [name] Other Clinician: Referring Malon Branton: Treating Rilie Glanz/Extender: [name] in Treatment: 7 Wound Status Wound Number: 1 Primary Etiology: Abrasion Wound Location: Left, Lateral Ankle Wound Status: Healed - Epithelialized Wounding Event: Laceration Comorbid History: Hypertension, Type II Diabetes Date Acquired: 10/24/2021 Weeks Of Treatment: 7 Clustered Wound: No Wound Measurements Length: (cm) Width: (cm) Depth:  (cm) Area: (cm) Volume: (cm) 0 % Reduction in Area: 100% 0 % Reduction in Volume: 100% 0 Epithelialization: Large (67-100%) 0 Tunneling: No 0 Undermining: No Wound Description Classification: Full Thickness Without Exposed Support Wound Margin: Flat and Intact Exudate Amount: None Present Structures Foul Odor After Cleansing: No Slough/Fibrino No Wound Bed Granulation Amount: None Present (0%) Exposed Structure Necrotic Amount: None Present (0%) Fascia Exposed: No Fat Layer (Subcutaneous Tissue) Exposed: No Tendon Exposed: No Muscle Exposed: No Joint Exposed: No Bone Exposed: No Periwound Skin Texture Texture Color No Abnormalities Noted: Yes No Abnormalities Noted: Yes Moisture Temperature / Pain No Abnormalities Noted: Yes Temperature: No Abnormality Electronic Signature(s) Signed: 01/29/2022 4:57:50 PM By: Blanche East RN Entered By: Blanche East on 01/29/2022 13:02:23 -------------------------------------------------------------------------------- Vitals Details Patient Name: Date of Service: [name] 01/29/2022 1:15 PM Medical Record Number: [id number] Patient Account Number: [id number] Date of Birth/Sex: Treating RN: 1975-08-14 (46 y.o. [name] Primary Care Ilka Lovick: [name] Other Clinician: Referring Thelonious Kauffmann: Treating Talani Brazee/Extender: [name] in Treatment: 7 Vital Signs MULKI, ROESLER ([id number]) [id number] Page 8 of 8 Time Taken: 12:53 Temperature (F): 98.6 Height (in): 58 Pulse (bpm): 103 Weight (lbs): 111 Respiratory Rate (breaths/min): 18 Body Mass Index (BMI): 23.2 Blood Pressure (mmHg): 121/58 Reference Range: 80 - 120 mg / dl Electronic Signature(s) Signed: 01/29/2022 4:57:50 PM By: Blanche East RN Entered By: Blanche East on 01/29/2022 12:53:14

## 2022-03-04 ENCOUNTER — Ambulatory Visit (INDEPENDENT_AMBULATORY_CARE_PROVIDER_SITE_OTHER): Payer: Medicaid Other | Admitting: Podiatry

## 2022-03-04 ENCOUNTER — Encounter: Payer: Self-pay | Admitting: Podiatry

## 2022-03-04 DIAGNOSIS — B351 Tinea unguium: Secondary | ICD-10-CM | POA: Diagnosis not present

## 2022-03-04 DIAGNOSIS — M79674 Pain in right toe(s): Secondary | ICD-10-CM | POA: Diagnosis not present

## 2022-03-04 DIAGNOSIS — E0865 Diabetes mellitus due to underlying condition with hyperglycemia: Secondary | ICD-10-CM

## 2022-03-04 DIAGNOSIS — M79675 Pain in left toe(s): Secondary | ICD-10-CM | POA: Diagnosis not present

## 2022-03-07 NOTE — Progress Notes (Signed)
  Subjective:  Patient ID: [name], female    DOB: 1975-09-03,  MRN: [id number]  [name] presents to clinic today for follow up at risk foot care with h/o uncontrolled diabetes, h/o b/l foot ulcerations and painful thick toenails that are difficult to trim. Pain interferes with ambulation. Aggravating factors include wearing enclosed shoe gear. Pain is relieved with periodic professional debridement.  Chief Complaint  Patient presents with   Nail Problem    Diabetic foot care BS-78 A1C-Do not know PCP-[facility]([place]) PCP VST-4 months ago   New problem(s): None.   PCP is [name], NP.  No Known Allergies  Review of Systems: Negative except as noted in the HPI.  Objective: No changes noted in today's physical examination.  [name] is a pleasant 46 y.o. female thin build in NAD. AAO x 3.  Vascular Examination: CFT <3 seconds b/l LE. Faintly palpable pedal pulses b/l. Pedal hair absent. No pain with calf compression b/l. No edema noted b/l LE. No cyanosis or clubbing noted b/l LE.  Dermatological Examination: Pedal integument with normal turgor, texture and tone BLE. No open wounds b/l LE. No interdigital macerations noted b/l LE. No hyperkeratotic nor porokeratotic lesions present on today's visit.Toenails 1-5 b/l elongated, discolored, dystrophic, thickened, crumbly with subungual debris and tenderness to dorsal palpation.    Musculoskeletal Examination: Muscle strength 5/5 to all lower extremity muscle groups bilaterally. HAV with bunion deformity noted b/l LE. Patient ambulates independent of any assistive aids. She is wearing soft, supportive shoe gear on today with no evidence of skin irritations on lower extremities.  Neurological Examination: Protective sensation intact 5/5 intact bilaterally with 10g monofilament b/l. Vibratory sensation intact b/l. Assessment/Plan: 1. Pain due to onychomycosis of toenails of both  feet   2. Diabetes mellitus due to underlying condition, uncontrolled, with hyperglycemia ([place])     No orders of the defined types were placed in this encounter.   -Consent given for treatment as described below: -Examined patient. -Continue foot and shoe inspections daily. Monitor blood glucose per PCP/Endocrinologist's recommendations. -Patient to continue soft, supportive shoe gear daily. -Toenails 1-5 bilaterally were debrided in length and girth with sterile nail nippers and dremel. Pinpoint bleeding of L 2nd toe addressed with Lumicain Hemostatic Solution, cleansed with alcohol. triple antibiotic ointment applied. No further treatment required by patient/caregiver. -Patient/POA to call should there be question/concern in the interim.   Return in about 3 months (around 06/04/2022).  Marzetta Board, DPM

## 2022-04-03 NOTE — Congregational Nurse Program (Signed)
  Dept: [phone]   Congregational Nurse Program Note  Date of Encounter: 03/25/2022  Initial clinic visit at [facility]. Blood pressure 126/70, blood glucose not checked due to recent meal, stated that she has been checking blood glucose twice daily and it is below 140 at all times checked.  Educated regarding services for residents at [employer] including food pantry each Friday and the additional food distribution for the holiday season. 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)    Ectrodactyly    Genetic defect    genetic mutation of unknown significance per [facility] genetic notes   GERD (gastroesophageal reflux disease)    Hypercholesterolemia    Hypertension    Intellectual disability    Microcephaly ([place])    Pelvic mass    Ventral hernia     Encounter Details:  CNP Questionnaire - 03/25/22 1623       Questionnaire   Ask client: Do you give verbal consent for me to treat you today? Yes    Student Assistance N/A    Location Patient [place]    Visit Setting with Client Organization    Patient Status Unknown   Moved into apartment at [facility] on 03/07/2022   Insurance Medicaid    Insurance/Financial Assistance Referral N/A    Medication N/A    Medical Provider Yes    Screening Referrals Made N/A    Medical Referrals Made N/A    Medical Appointment Made N/A    Recently w/o PCP, now 1st time PCP visit completed due to CNs referral or appointment made N/A    Food Have Food Insecurities;Referred to Food Pantry    Transportation Need transportation assistance    Housing/Utilities N/A    Interpersonal Safety N/A    Interventions Advocate/Support;Case Management;Counsel;Spiritual Care;Educate    Abnormal to Normal Screening Since Last CN Visit N/A    Screenings CN Performed Blood Pressure;Pulse Ox    Sent Client to Lab for: N/A     Did client attend any of the following based off CNs referral or appointments made? N/A    ED Visit Averted N/A    Life-Saving Intervention Made N/A

## 2022-04-08 DIAGNOSIS — E1110 Type 2 diabetes mellitus with ketoacidosis without coma: Secondary | ICD-10-CM

## 2022-04-08 LAB — GLUCOSE, POCT (MANUAL RESULT ENTRY): POC Glucose: 266 mg/dl — AB (ref 70–99)

## 2022-04-08 NOTE — Congregational Nurse Program (Signed)
  Dept: [phone]   Congregational Nurse Program Note  Date of Encounter: 04/08/2022  Clinic visit to check blood pressure and afternoon blood glucose. Blood glucose 266, advised to call MD in the morning because of variance with morning and evening blood glucose levels. States she has not taken Metformin d/t side effects.     Has Trulicity 6.29 prefilled pen that is not on medication list and unable to identify ordering provider.  She is to bring this information to clinic on Thursday and will give instructions once order is available to review with her. 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 Yes    Student Assistance N/A    Location Patient [place]    Visit Setting with Client Organization    Patient Status Unknown   Moved into apartment at [facility] on 03/07/2022   Insurance Medicaid    Insurance/Financial Assistance Referral N/A    Medication N/A    Medical Provider Yes    Screening Referrals Made N/A    Medical Referrals Made [facility] PCP/Clinic    Medical Appointment Made N/A    Recently w/o PCP, now 1st time PCP visit completed due to CNs referral or appointment made N/A    Food Have Food Insecurities;Referred to Food Pantry    Transportation Need transportation assistance    Housing/Utilities N/A    Interpersonal Safety N/A    Interventions Advocate/Support;Counsel;Spiritual Care;Educate;Reviewed Medications    Abnormal to Normal  Screening Since Last CN Visit N/A    Screenings CN Performed Blood Pressure;Pulse Ox;Blood Glucose;Weight    Sent Client to Lab for: N/A    Did client attend any of the following based off CNs referral or appointments made? N/A    ED Visit Averted N/A    Life-Saving Intervention Made N/A

## 2022-04-09 ENCOUNTER — Other Ambulatory Visit: Payer: Self-pay

## 2022-04-10 NOTE — Congregational Nurse Program (Signed)
Dept: [phone]   Congregational Nurse Program Note  Date of Encounter: 04/10/2022  Clinic visit to teach injection of Trulicity that was to have been started on 04/03/2022.  She didn't know how to use the prefilled syringe and didn't take the medicine.  Reviewed the use of the prefilled Trulicity 9.37 and watched the online video for giving the injection. Dept: [phone]   Congregational Nurse Program Note  Date of Encounter: 04/10/2022  Clinic visit to teach injection of Trulicity prefilled syringe.  Watched online video, injection given lower right quadrant of abdomen. Discussed reason for the medicine, possible side effects and reminded her to call MD office if they do not return her call regarding Metformin.  She has not taken the Metformin d/t GI side effects but had not notified MD.  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 Yes    Student Assistance N/A    Location Patient [facility]    Visit Setting with Client Organization    Patient Status Unknown   Moved into apartment at [facility] on 03/07/2022   Insurance Medicaid    Insurance/Financial Assistance Referral N/A    Medication N/A    Medical Provider Yes    Screening Referrals Made N/A    Medical Referrals Made [facility]    Medical Appointment Made N/A    Recently w/o PCP, now 1st time PCP visit completed due to CNs referral or appointment made N/A    Food Have Food Insecurities     Transportation Need transportation assistance    Housing/Utilities N/A    Interpersonal Safety N/A    Interventions Advocate/Support;Counsel;Spiritual Care;Educate;Reviewed Medications    Abnormal to Normal Screening Since Last CN Visit N/A    Screenings CN Performed N/A    Sent Client to Lab for: N/A    Did client attend any of the following based off CNs referral or appointments made? 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 Yes    Student Assistance N/A    Location Patient [facility]  Visit Setting with Client Organization    Patient Status Unknown   Moved into apartment at [facility] on 03/07/2022   Insurance Medicaid    Insurance/Financial Assistance Referral N/A    Medication N/A    Medical Provider Yes    Screening Referrals Made N/A    Medical Referrals Made [facility]    Medical Appointment Made N/A    Recently w/o PCP, now 1st time PCP visit completed due to CNs referral or appointment made N/A    Food Have Food Insecurities    Transportation Need transportation assistance    Housing/Utilities N/A    Interpersonal Safety N/A    Interventions Advocate/Support;Counsel;Spiritual Care;Educate;Reviewed Medications    Abnormal to Normal Screening Since Last CN Visit  N/A    Screenings CN Performed N/A    Sent Client to Lab for: N/A    Did client attend any of the following based off CNs referral or appointments made? 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 Yes    Student Assistance N/A    Location Patient [facility]    Visit Setting with Client Organization    Patient Status Unknown   Moved into apartment at [facility] on 03/07/2022   Insurance Medicaid    Insurance/Financial Assistance Referral N/A    Medication N/A    Medical Provider Yes    Screening Referrals Made N/A    Medical Referrals Made [facility]    Medical Appointment Made N/A    Recently w/o PCP, now 1st time PCP visit completed due to CNs referral or appointment made N/A    Food Have Food Insecurities    Transportation Need transportation assistance    Housing/Utilities N/A    Interpersonal Safety N/A    Interventions Advocate/Support;Counsel;Spiritual Care;Educate;Reviewed Medications    Abnormal to Normal Screening Since Last CN Visit N/A    Screenings CN Performed N/A    Sent Client to Lab for: N/A    Did client attend any of the following based off CNs referral or appointments made? N/A    ED Visit Averted N/A    Life-Saving Intervention Made N/A

## 2022-04-28 NOTE — Congregational Nurse Program (Signed)
  Dept: [phone]   Congregational Nurse Program Note  Date of Encounter: 04/28/2022  Clinic visit with Trulicity injection.  Reviewed all of steps needed to give the injection and assisted to inject medication lower left quadrant of abdomen. 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 Yes    Student Assistance N/A    Location Patient [facility]    Visit Setting with Client Organization    Patient Status Unknown   Moved into apartment at [facility] on 03/07/2022   Insurance Medicaid    Insurance/Financial Assistance Referral N/A    Medication N/A    Medical Provider Yes    Screening Referrals Made N/A    Medical Referrals Made N/A    Medical Appointment Made N/A    Recently w/o PCP, now 1st time PCP visit completed due to CNs referral or appointment made N/A    Food Have Food Insecurities    Transportation Need transportation assistance    Housing/Utilities N/A    Interpersonal Safety N/A    Interventions Advocate/Support;Counsel;Spiritual Care;Educate;Reviewed Medications    Abnormal to Normal Screening Since Last CN Visit N/A    Screenings CN Performed N/A    Sent Client to Lab for: N/A    Did client attend any of the following based off CNs referral or appointments made? N/A    ED Visit Averted N/A    Life-Saving Intervention Made N/A

## 2022-04-28 NOTE — Congregational Nurse Program (Signed)
  Dept: [phone]   Congregational Nurse Program Note  Date of Encounter: 04/28/2022  Clinic visit for administration and teaching for self administration of weekly Trulicity subcutaneous injection. Client able to give steps for injection and able to identify correct locations for the injection.  Given injection in right lower quadrant of abdomen.  Instructed her to take the one unused Trulicity pen to her pharmacy for replacement that she had unlocked prior to first visit for teaching administration of the medication. 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 Yes    Student Assistance [employer] Nurse    Location Patient [facility]    Visit Setting with Client Organization    Patient Status Unknown   Moved into apartment at [facility] on 03/07/2022   Insurance Medicaid    Insurance/Financial Assistance Referral N/A    Medication N/A    Medical Provider Yes    Screening Referrals Made N/A    Medical Referrals Made N/A    Medical Appointment Made N/A    Recently w/o PCP, now 1st time PCP visit completed due to CNs referral or appointment made N/A    Food Have Food Insecurities    Transportation Need transportation assistance    Housing/Utilities N/A    Interpersonal Safety N/A    Interventions Advocate/Support;Counsel;Spiritual Care;Educate;Reviewed Medications    Abnormal to Normal Screening Since Last CN Visit N/A    Screenings CN Performed N/A    Sent Client  to Lab for: N/A    Did client attend any of the following based off CNs referral or appointments made? N/A    ED Visit Averted N/A    Life-Saving Intervention Made N/A

## 2022-04-29 NOTE — Congregational Nurse Program (Signed)
  Dept: [phone]   Congregational Nurse Program Note  Date of Encounter: 04/29/2022  Clinic visit to check blood pressure and discuss blood glucose results from checking at home.  Blood glucose 126 this morning.  Has not gone to pharmacy to obtain replacement Trulicity pen, advised to discuss with pharmacist.  BP 97/66, pulse 94 and regular. Past Medical History: Past Medical History:  Diagnosis Date   Diabetes mellitus without complication ([place])    DiGeorge syndrome ([facility])    Ectrodactyly    Genetic defect    genetic mutation of unknown significance per [facility] genetic notes   GERD (gastroesophageal reflux disease)    Hypercholesterolemia    Hypertension    Intellectual disability    Microcephaly ([place])    Pelvic mass    Ventral hernia     Encounter Details:  CNP Questionnaire - 04/29/22 1654       Questionnaire   Ask client: Do you give verbal consent for me to treat you today? Yes    Nutritional therapist Nurse    Location Patient [facility]    Visit Setting with Client Organization    Patient Status Unknown   Moved into apartment at [facility] on 03/07/2022   Insurance Medicaid    Insurance/Financial Assistance Referral N/A    Medication N/A    Medical Provider Yes    Screening Referrals Made N/A    Medical Referrals Made [facility]    Medical Appointment Made N/A    Recently w/o PCP, now 1st time PCP visit completed due to CNs referral or appointment made N/A    Food Have Food Insecurities    Transportation Need transportation assistance    Housing/Utilities N/A    Interpersonal Safety N/A    Interventions Advocate/Support;Counsel;Spiritual Care;Educate;Reviewed Medications;Case Management    Abnormal to Normal Screening Since Last CN Visit N/A    Screenings CN Performed Blood Pressure;Pulse Ox    Sent Client to Lab for: N/A    Did client attend any of the following based off CNs referral or appointments made? N/A    ED Visit  Averted N/A    Life-Saving Intervention Made N/A

## 2022-05-08 NOTE — Congregational Nurse Program (Signed)
  Dept: [phone]   Congregational Nurse Program Note  Date of Encounter: 05/08/2022  Clinic visit to assist with giving Trulicity injection. She able to verbalize 95% of steps to give the injection, injection given left lower quadrant of abdomen.  States blood sugar this am was 58, didn't take her insulin.  Ate part of lunch just prior to taking Trulicity. Past Medical History: Past Medical History:  Diagnosis Date   Diabetes mellitus without complication ([place])    DiGeorge syndrome (H[facility])    Ectrodactyly    Genetic defect    genetic mutation of unknown significance per [facility] genetic notes   GERD (gastroesophageal reflux disease)    Hypercholesterolemia    Hypertension    Intellectual disability    Microcephaly ([place])    Pelvic mass    Ventral hernia     Encounter Details:  CNP Questionnaire - 05/08/22 1130       Questionnaire   Ask client: Do you give verbal consent for me to treat you today? Yes    Student Assistance [employer] Nurse    Location Patient [facility]    Visit Setting with Client Organization    Patient Status Unknown   Moved into apartment at [facility] on 03/07/2022   Insurance Medicaid    Insurance/Financial Assistance Referral N/A    Medication N/A    Medical Provider Yes    Screening Referrals Made N/A    Medical Referrals Made N/A    Medical Appointment Made N/A    Recently w/o PCP, now 1st time PCP visit completed due to CNs referral or appointment made N/A    Food Have Food Insecurities    Transportation Need transportation assistance    Housing/Utilities N/A    Interpersonal Safety N/A    Interventions Advocate/Support;Counsel;Educate;Reviewed Medications    Abnormal to Normal Screening Since Last CN Visit N/A    Screenings CN Performed Blood Pressure;Pulse Ox    Sent Client to Lab for: N/A    Did client attend any of the following based off CNs referral or appointments made? N/A    ED Visit Averted N/A    Life-Saving Intervention  Made N/A

## 2022-05-27 NOTE — Congregational Nurse Program (Signed)
  Dept: [phone]   Congregational Nurse Program Note  Date of Encounter: 05/27/2022  Clinic visit to assist with injection of Trulicity, reviewed blood glucose checks for past weeks, AM levels between 68 and 100, having results up to 200 in pm per her report.  Assisted to give Trulicity in left lower quadrant of abdomen. Past Medical History: Past Medical History:  Diagnosis Date   Diabetes mellitus without complication ([place])    DiGeorge syndrome (H[facility])    Ectrodactyly    Genetic defect    genetic mutation of unknown significance per [facility] genetic notes   GERD (gastroesophageal reflux disease)    Hypercholesterolemia    Hypertension    Intellectual disability    Microcephaly ([place])    Pelvic mass    Ventral hernia     Encounter Details:  CNP Questionnaire - 05/27/22 1430       Questionnaire   Ask client: Do you give verbal consent for me to treat you today? Yes    Student Assistance N/A    Location Patient [facility]    Visit Setting with Client Organization    Patient Status Unknown   Moved into apartment at [facility] on 03/07/2022   Insurance Medicaid    Insurance/Financial Assistance Referral N/A    Medication N/A    Medical Provider Yes    Screening Referrals Made N/A    Medical Referrals Made N/A    Medical Appointment Made N/A    Recently w/o PCP, now 1st time PCP visit completed due to CNs referral or appointment made N/A    Food Have Food Insecurities    Transportation Need transportation assistance    Housing/Utilities N/A    Interpersonal Safety N/A    Interventions Advocate/Support;Counsel;Educate    Abnormal to Normal Screening Since Last CN Visit N/A    Screenings CN Performed N/A    Sent Client to Lab for: N/A    Did client attend any of the following based off CNs referral or appointments made? N/A    ED Visit Averted N/A    Life-Saving Intervention Made N/A

## 2022-05-27 NOTE — Congregational Nurse Program (Signed)
  Dept: [phone]   Congregational Nurse Program Note  Date of Encounter: 05/20/2022  Clinic visit to assist with injection of Trulicity. Missed dosage due on 05/15/2022, explained that with once weekly dosage she will now have to have the injection on Tuesdays.  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 Past Medical History: Past Medical History:  Diagnosis Date   Diabetes mellitus without complication ([place])    DiGeorge syndrome (H[facility])    Ectrodactyly    Genetic defect    genetic mutation of unknown significance per [facility] genetic notes   GERD (gastroesophageal reflux disease)    Hypercholesterolemia    Hypertension    Intellectual disability    Microcephaly ([place])    Pelvic mass    Ventral hernia     Encounter Details:  CNP Questionnaire - 05/20/22 1500       Questionnaire   Ask client: Do you give verbal consent for me to treat you today? Yes    Student Assistance N/A    Location Patient [place]    Visit Setting with Client Organization    Patient Status Unknown   Moved into apartment at [facility] on 03/07/2022   Insurance Medicaid    Insurance/Financial Assistance Referral N/A    Medication N/A    Medical Provider Yes    Screening Referrals Made N/A    Medical Referrals Made N/A    Medical Appointment Made N/A    Recently w/o PCP, now 1st time PCP visit completed due to CNs referral or appointment made N/A    Food Have Food Insecurities    Transportation Need transportation assistance    Housing/Utilities N/A    Interpersonal Safety N/A    Interventions Advocate/Support;Counsel;Educate;Reviewed Medications    Abnormal to Normal Screening Since Last CN Visit N/A    Screenings CN Performed N/A    Sent Client to Lab for: N/A    Did client attend any of the following based off CNs  referral or appointments made? N/A    ED Visit Averted N/A    Life-Saving Intervention Made N/A

## 2022-06-10 NOTE — Congregational Nurse Program (Signed)
  Dept: [phone]   Congregational Nurse Program Note  Date of Encounter: 06/10/2022  Clinic visit for assistance with Trulicity injection.  States she ate small breakfast and had not had lunch at time of visit due to going out shopping.  Blood glucose 43, given chocolate chip granola bar and a fruit punch drink. No sweating, dizziness, no blurred vision per her report and was ambulating  She reports that she has a fried chicken salad for lunch that she was instructed to eat as soon as she returned to her apartment.  Is to check blood glucose 2 hours after eating the salad and again before taking evening insulin.  To call MD office if blood glucose is below 60.  Past Medical History: Past Medical History:  Diagnosis Date   Diabetes mellitus without complication ([place])    DiGeorge syndrome (HCC)    Ectrodactyly    Genetic defect    genetic mutation of unknown significance per [facility] genetic notes   GERD (gastroesophageal reflux disease)    Hypercholesterolemia    Hypertension    Intellectual disability    Microcephaly ([place])    Pelvic mass    Ventral hernia     Encounter Details:  CNP Questionnaire - 06/10/22 1620       Questionnaire   Ask client: Do you give verbal consent for me to treat you today? Yes    Student Assistance N/A    Location Patient [place]    Visit Setting with Client Organization    Patient Status Unknown   Moved into apartment at [facility] on 03/07/2022   Insurance Medicaid    Insurance/Financial Assistance Referral N/A    Medication N/A    Medical Provider Yes    Screening Referrals Made N/A    Medical Referrals Made N/A    Medical Appointment Made N/A    Recently w/o PCP, now 1st time PCP visit completed due to CNs referral or appointment made N/A    Food Have Food Insecurities    Transportation Need transportation assistance    Housing/Utilities N/A    Interpersonal Safety N/A    Interventions  Advocate/Support;Counsel;Educate;Navigate Healthcare System;Reviewed Medications    Abnormal to Normal Screening Since Last CN Visit N/A    Screenings CN Performed Blood Glucose    Sent Client to Lab for: N/A    Did client attend any of the following based off CNs referral or appointments made? N/A    ED Visit Averted Yes    Life-Saving Intervention Made N/A

## 2022-06-11 ENCOUNTER — Ambulatory Visit (INDEPENDENT_AMBULATORY_CARE_PROVIDER_SITE_OTHER): Payer: Medicaid Other | Admitting: Podiatry

## 2022-06-11 ENCOUNTER — Ambulatory Visit: Payer: Medicaid Other | Admitting: Podiatry

## 2022-06-11 ENCOUNTER — Encounter: Payer: Self-pay | Admitting: Podiatry

## 2022-06-11 DIAGNOSIS — M79674 Pain in right toe(s): Secondary | ICD-10-CM | POA: Diagnosis not present

## 2022-06-11 DIAGNOSIS — M79675 Pain in left toe(s): Secondary | ICD-10-CM | POA: Diagnosis not present

## 2022-06-11 DIAGNOSIS — E0865 Diabetes mellitus due to underlying condition with hyperglycemia: Secondary | ICD-10-CM | POA: Diagnosis not present

## 2022-06-11 DIAGNOSIS — B351 Tinea unguium: Secondary | ICD-10-CM

## 2022-06-11 NOTE — Progress Notes (Signed)

## 2022-06-17 NOTE — Congregational Nurse Program (Signed)
  Dept: [phone]   Congregational Nurse Program Note  Date of Encounter: 06/17/2022  Clinic visit to assist with injection of Jardiance.  Resident able to verbalize steps for using the prefilled syringe gave self injection left lower quadrant of abdomen with minimal assistance.  States that blood glucose 64 this AM before breakfast and has been ranging in the 100 level most days.  Had just eaten lunch prior to clinic visit.  Reviewed food intake for past two days and has eaten regularly.  Weight 109 lbs, BP 100/68, pulse 99. Past Medical History: Past Medical History:  Diagnosis Date   Diabetes mellitus without complication ([place])    DiGeorge syndrome (HCC)    Ectrodactyly    Genetic defect    genetic mutation of unknown significance per [facility] genetic notes   GERD (gastroesophageal reflux disease)    Hypercholesterolemia    Hypertension    Intellectual disability    Microcephaly ([place])    Pelvic mass    Ventral hernia     Encounter Details:  CNP Questionnaire - 06/17/22 1700       Questionnaire   Ask client: Do you give verbal consent for me to treat you today? Yes    Student Assistance N/A    Location Patient [place]    Visit Setting with Client Organization    Patient Status Unknown   Moved into apartment at [facility] on 03/07/2022   Insurance Medicaid    Insurance/Financial Assistance Referral N/A    Medication N/A    Medical Provider Yes    Screening Referrals Made N/A    Medical Referrals Made N/A    Medical Appointment Made N/A    Recently w/o PCP, now 1st time PCP visit completed due to CNs referral or appointment made N/A    Food Have Food Insecurities    Transportation Need transportation assistance    Housing/Utilities N/A    Interpersonal Safety N/A    Interventions Advocate/Support;Counsel;Educate;Reviewed Medications;Case Management    Abnormal to Normal Screening Since Last CN Visit N/A    Screenings CN Performed Blood  Pressure;Pulse Ox    Sent Client to Lab for: N/A    Did client attend any of the following based off CNs referral or appointments made? N/A    ED Visit Averted N/A    Life-Saving Intervention Made N/A

## 2022-06-26 ENCOUNTER — Encounter (HOSPITAL_COMMUNITY): Payer: Self-pay

## 2022-06-26 ENCOUNTER — Emergency Department (HOSPITAL_COMMUNITY)
Admission: EM | Admit: 2022-06-26 | Discharge: 2022-06-26 | Disposition: A | Discharge status: Home / Self Care | Payer: Medicaid Other | Attending: Student | Admitting: Student

## 2022-06-26 ENCOUNTER — Other Ambulatory Visit: Payer: Self-pay

## 2022-06-26 DIAGNOSIS — Z7984 Long term (current) use of oral hypoglycemic drugs: Secondary | ICD-10-CM | POA: Diagnosis not present

## 2022-06-26 DIAGNOSIS — I10 Essential (primary) hypertension: Secondary | ICD-10-CM | POA: Diagnosis not present

## 2022-06-26 DIAGNOSIS — E119 Type 2 diabetes mellitus without complications: Secondary | ICD-10-CM | POA: Insufficient documentation

## 2022-06-26 DIAGNOSIS — E11649 Type 2 diabetes mellitus with hypoglycemia without coma: Secondary | ICD-10-CM | POA: Insufficient documentation

## 2022-06-26 DIAGNOSIS — R42 Dizziness and giddiness: Secondary | ICD-10-CM | POA: Diagnosis present

## 2022-06-26 DIAGNOSIS — E162 Hypoglycemia, unspecified: Secondary | ICD-10-CM

## 2022-06-26 DIAGNOSIS — E1110 Type 2 diabetes mellitus with ketoacidosis without coma: Secondary | ICD-10-CM | POA: Diagnosis not present

## 2022-06-26 DIAGNOSIS — Z794 Long term (current) use of insulin: Secondary | ICD-10-CM | POA: Diagnosis not present

## 2022-06-26 LAB — CBC WITH DIFFERENTIAL/PLATELET
Abs Immature Granulocytes: 0.02 10*3/uL (ref 0.00–0.07)
Basophils Absolute: 0 10*3/uL (ref 0.0–0.1)
Basophils Relative: 1 %
Eosinophils Absolute: 0 10*3/uL (ref 0.0–0.5)
Eosinophils Relative: 0 %
HCT: 28.1 % — ABNORMAL LOW (ref 36.0–46.0)
Hemoglobin: 8.2 g/dL — ABNORMAL LOW (ref 12.0–15.0)
Immature Granulocytes: 0 %
Lymphocytes Relative: 15 %
Lymphs Abs: 1.2 10*3/uL (ref 0.7–4.0)
MCH: 20.3 pg — ABNORMAL LOW (ref 26.0–34.0)
MCHC: 29.2 g/dL — ABNORMAL LOW (ref 30.0–36.0)
MCV: 69.6 fL — ABNORMAL LOW (ref 80.0–100.0)
Monocytes Absolute: 0.4 10*3/uL (ref 0.1–1.0)
Monocytes Relative: 5 %
Neutro Abs: 6.2 10*3/uL (ref 1.7–7.7)
Neutrophils Relative %: 79 %
Platelets: 553 10*3/uL — ABNORMAL HIGH (ref 150–400)
RBC: 4.04 MIL/uL (ref 3.87–5.11)
RDW: 19.5 % — ABNORMAL HIGH (ref 11.5–15.5)
WBC: 7.9 10*3/uL (ref 4.0–10.5)
nRBC: 0 % (ref 0.0–0.2)

## 2022-06-26 LAB — COMPREHENSIVE METABOLIC PANEL
ALT: 12 U/L (ref 0–44)
AST: 26 U/L (ref 15–41)
Albumin: 3.4 g/dL — ABNORMAL LOW (ref 3.5–5.0)
Alkaline Phosphatase: 65 U/L (ref 38–126)
Anion gap: 11 (ref 5–15)
BUN: 15 mg/dL (ref 6–20)
CO2: 23 mmol/L (ref 22–32)
Calcium: 8.5 mg/dL — ABNORMAL LOW (ref 8.9–10.3)
Chloride: 102 mmol/L (ref 98–111)
Creatinine, Ser: 0.6 mg/dL (ref 0.44–1.00)
GFR, Estimated: >60 mL/min (ref >60)
Glucose, Bld: 59 mg/dL — ABNORMAL LOW (ref 70–99)
Potassium: 4 mmol/L (ref 3.5–5.1)
Sodium: 136 mmol/L (ref 135–145)
Total Bilirubin: 0.8 mg/dL (ref 0.3–1.2)
Total Protein: 7.7 g/dL (ref 6.5–8.1)

## 2022-06-26 LAB — CBG MONITORING, ED
Glucose-Capillary: 52 mg/dL — ABNORMAL LOW (ref 70–99)
Glucose-Capillary: 146 mg/dL — ABNORMAL HIGH (ref 70–99)

## 2022-06-26 MED ORDER — DEXTROSE 50 % IV SOLN: 1.0000 | Freq: Once | INTRAVENOUS | Status: DC

## 2022-06-26 MED ORDER — ONDANSETRON 4 MG PO TBDP
4.0000 mg | ORAL_TABLET | Freq: Once | ORAL | Status: DC
  Filled 2022-06-26: qty 1

## 2022-06-26 MED ORDER — LACTATED RINGERS IV BOLUS: 1000.0000 mL | Freq: Once | INTRAVENOUS | Status: DC

## 2022-06-26 NOTE — ED Notes (Signed)
Pt given OJ and peanut butter and crackers

## 2022-06-26 NOTE — ED Notes (Signed)
Patient ambulated from stretcher to bathroom UAL

## 2022-06-26 NOTE — ED Triage Notes (Signed)
BIB EMS from home for inability to move left leg. EMS reports pt is moving left leg and stating the leg is moving on its own. Pt stood up on arrival and got into WC.

## 2022-06-26 NOTE — ED Notes (Signed)
Patient given discharge instructions and follow up care. Patient verbalized understanding. Patient taken out of ED via wheelchair to lobby to wait on lyft ride.

## 2022-06-26 NOTE — ED Provider Notes (Signed)
[facility] Provider Note  CSN: [id number] Arrival date & time: 06/26/22 1821  Chief Complaint(s) Mobility Issue  HPI [name] is a 47 y.o. female with PMH DiGeorge syndrome, ectrodactyly, HTN, HLD, T2DM who presents emergency room for evaluation of dizziness.  Patient states that when she stood up today she felt unsteady on her feet particular her left leg.  She also endorses some mild nausea.  Here in the emergency department, she continues to complain of lightheadedness.  Denies chest pain, shortness of breath, abdominal pain, headache, fever or other systemic symptoms.   Past Medical History Past Medical History:  Diagnosis Date   Diabetes mellitus without complication ([place])    DiGeorge syndrome ([place])    Ectrodactyly    Genetic defect    genetic mutation of unknown significance per [facility] genetic notes   GERD (gastroesophageal reflux disease)    Hypercholesterolemia    Hypertension    Intellectual disability    Microcephaly ([place])    Pelvic mass    Ventral hernia    Patient Active Problem List   Diagnosis Date Noted   Hypotension due to hypovolemia 12/17/2021   Acute lower UTI 12/17/2021   AKI (acute kidney injury) ([place]) 12/17/2021   Insulin-requiring or dependent type II diabetes mellitus ([place]) 12/17/2021   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[place]) 12/17/2021   Chest pain 09/28/2020   GERD (gastroesophageal reflux disease) 10/20/2019   Intellectual disability 03/09/2018   Microcephaly ([place]) 02/19/2017   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 Authorizing Provider  Insulin Syringe-Needle U-100 30G X 5/16" 0.5 ML MISC Use to administer insulin 2 (two) times daily. 07/03/21   Elsie Stain, MD  Accu-Chek Softclix Lancets lancets Use 3 (three) times daily as directed 08/08/21   Elsie Stain, MD  Acetaminophen 500 MG capsule Take 500-1,000 mg by mouth every 6 (six) hours as needed for pain.    [provider]  ASPIRIN LOW DOSE 81 MG EC tablet Take 1 tablet (81 mg total) by mouth daily. 07/24/21   Elsie Stain, MD  Blood Glucose Monitoring Suppl (ACCU-CHEK GUIDE) w/Device KIT Use to check blood sugar 3 (three) times daily. 07/03/21   Elsie Stain, MD  EASY COMFORT PEN NEEDLES 31G X 5 MM MISC USE TWICE A DAY WITH INSULIN PEN 12/30/17   [provider]  fluticasone (FLONASE) 50 MCG/ACT nasal spray Place 1 spray into both nostrils daily. Patient taking differently: Place 1 spray into both nostrils daily as needed for allergies or rhinitis. 08/01/21   Elodia Florence., MD  glucose blood (ACCU-CHEK GUIDE) test strip Use 3 (three) times daily as instructed 07/03/21   Elsie Stain, MD  insulin glargine (LANTUS) 100 UNIT/ML injection Inject 0.3 mLs (30 Units total) into the skin in the morning AND 0.2 mLs (20 Units total) every evening. Patient taking differently: Inject 40 units into the skin in the morning and 30 units at bedtime 07/03/21   Elsie Stain, MD  Insulin Syringe-Needle U-100 (INSULIN SYRINGE .5CC/31GX5/16") 31G X 5/16" 0.5 ML  MISC Inject 1 Syringe into the skin daily. 09/18/21   Elsie Stain, MD  Lancets Misc. (ACCU-CHEK SOFTCLIX LANCET DEV) KIT Use to check blood sugar twice a day 07/24/21   Elsie Stain, MD  metFORMIN (GLUCOPHAGE) 500 MG tablet Take 1 tablet (500 mg total) by mouth 2 (two) times daily with a meal. 07/24/21   Elsie Stain, MD  omeprazole (PRILOSEC) 20 MG capsule Take 1 capsule (20 mg total) by mouth  daily. Patient taking differently: Take 20 mg by mouth daily before breakfast. 06/26/17   Avie Echevaria B, PA-C  ondansetron (ZOFRAN) 4 MG tablet Take 1 tablet (4 mg total) by mouth every 6 (six) hours. Patient not taking: Reported on 12/17/2021 02/07/17   Nat Christen, MD  pravastatin (PRAVACHOL) 20 MG tablet Take 20 mg by mouth daily.    [provider]  silver sulfADIAZINE (SILVADENE) 1 % cream Apply 1 application topically to wound on both ankles once daily. Patient not taking: Reported on 12/17/2021 07/03/21   Elsie Stain, MD                                                                                                                                    Past Surgical History Past Surgical History:  Procedure Laterality Date   CHOLECYSTECTOMY     HAND SURGERY Right    HERNIA REPAIR     Family History Family History  Problem Relation Age of Onset   Diabetes Mother    GER disease Mother    Diabetes Father     Social History Social History   Tobacco Use   Smoking status: Never   Smokeless tobacco: Never  Vaping Use   Vaping Use: Never used  Substance Use Topics   Alcohol use: No   Drug use: No   Allergies Patient has no known allergies.  Review of Systems Review of Systems  Neurological:  Positive for light-headedness.    Physical Exam Vital Signs  I have reviewed the triage vital signs BP 125/69 (BP Location: Left Arm)   Pulse (!) 113   Temp (!) 97.5 F (36.4 C) (Oral)   Resp 17   Ht 4\' 9"  (1.448 m)   Wt 49.9 kg   SpO2 100%   BMI 23.80 kg/m   Physical Exam Vitals and nursing note reviewed.  Constitutional:      General: She is not in acute distress.    Appearance: She is well-developed.  HENT:     Head: Normocephalic and atraumatic.  Eyes:     Conjunctiva/sclera: Conjunctivae normal.  Cardiovascular:     Rate and Rhythm: Normal rate and regular rhythm.     Heart sounds: No murmur heard. Pulmonary:     Effort: Pulmonary effort is  normal. No respiratory distress.     Breath sounds: Normal breath sounds.  Abdominal:     Palpations: Abdomen is soft.     Tenderness:  There is no abdominal tenderness.  Musculoskeletal:        General: No swelling.     Cervical back: Neck supple.  Skin:    General: Skin is warm and dry.     Capillary Refill: Capillary refill takes less than 2 seconds.  Neurological:     Mental Status: She is alert.  Psychiatric:        Mood and Affect: Mood normal.     ED Results and Treatments Labs (all labs ordered are listed, but only abnormal results are displayed) Labs Reviewed  COMPREHENSIVE METABOLIC PANEL - Abnormal; Notable for the following components:      Result Value   Glucose, Bld 59 (*)    Calcium 8.5 (*)    Albumin 3.4 (*)    All other components within normal limits  CBC WITH DIFFERENTIAL/PLATELET - Abnormal; Notable for the following components:   Hemoglobin 8.2 (*)    HCT 28.1 (*)    MCV 69.6 (*)    MCH 20.3 (*)    MCHC 29.2 (*)    RDW 19.5 (*)    Platelets 553 (*)    All other components within normal limits  CBG MONITORING, ED - Abnormal; Notable for the following components:   Glucose-Capillary 52 (*)    All other components within normal limits  CBG MONITORING, ED - Abnormal; Notable for the following components:   Glucose-Capillary 146 (*)    All other components within normal limits                                                                                                                          Radiology No results found.  Pertinent labs & imaging results that were available during my care of the patient were reviewed by me and considered in my medical decision making (see MDM for details).  Medications Ordered in ED Medications - No data to display                                                                                                                                    Procedures Procedures  (including critical care time)  Medical  Decision Making / ED Course   This patient presents to the ED for concern of dizziness, fatigue, this involves an extensive number of treatment options, and is a complaint that carries with it a high risk of complications and morbidity.  The differential diagnosis includes electrolyte abnormality, dehydration, hypoglycemia, viral illness, presyncope  MDM: Patient seen emergency room for evaluation of dizziness.  Physical exam is unremarkable from patient's baseline and does show the ectrodactyly consistent with her DiGeorge syndrome.  Initial laboratory evaluation showing hemoglobin of 8.2 with an MCV of 69.6 which is near patient's baseline.  Patient is hyperglycemic with an initial blood sugar of 52 and patient was given oral glucose with recheck glucose at 146.  On reevaluation, dizziness has resolved.  With symptoms improved, patient dizziness likely secondary to hypoglycemia and dizziness has resolved she is safe for discharge with outpatient follow-up.   Additional history obtained: \ -External records from outside source obtained and reviewed including: Chart review including previous notes, labs, imaging, consultation notes   Lab Tests: -I ordered, reviewed, and interpreted labs.   The pertinent results include:   Labs Reviewed  COMPREHENSIVE METABOLIC PANEL - Abnormal; Notable for the following components:      Result Value   Glucose, Bld 59 (*)    Calcium 8.5 (*)    Albumin 3.4 (*)    All other components within normal limits  CBC WITH DIFFERENTIAL/PLATELET - Abnormal; Notable for the following components:   Hemoglobin 8.2 (*)    HCT 28.1 (*)    MCV 69.6 (*)    MCH 20.3 (*)    MCHC 29.2 (*)    RDW 19.5 (*)    Platelets 553 (*)    All other components within normal limits  CBG MONITORING, ED - Abnormal; Notable for the following components:   Glucose-Capillary 52 (*)    All other components within normal limits  CBG MONITORING, ED - Abnormal; Notable for the following  components:   Glucose-Capillary 146 (*)    All other components within normal limits    ]   Medicines ordered and prescription drug management: Meds ordered this encounter  Medications   DISCONTD: lactated ringers bolus 1,000 mL   DISCONTD: dextrose 50 % solution 50 mL   DISCONTD: ondansetron (ZOFRAN-ODT) disintegrating tablet 4 mg    -I have reviewed the patients home medicines and have made adjustments as needed  Critical interventions none    Cardiac Monitoring: The patient was maintained on a cardiac monitor.  I personally viewed and interpreted the cardiac monitored which showed an underlying rhythm of: NSR  Social Determinants of Health:  Factors impacting patients care include: none   Reevaluation: After the interventions noted above, I reevaluated the patient and found that they have :improved  Co morbidities that complicate the patient evaluation  Past Medical History:  Diagnosis Date   Diabetes mellitus without complication ([place])    DiGeorge syndrome ([place])    Ectrodactyly    Genetic defect    genetic mutation of unknown significance per [facility] genetic notes   GERD (gastroesophageal reflux disease)    Hypercholesterolemia    Hypertension    Intellectual disability    Microcephaly ([place])    Pelvic mass    Ventral hernia       Dispostion: I considered admission for this patient, but at this time she does not meet inpatient criteria for admission she is safe for discharge outpatient follow-up     Final Clinical Impression(s) / ED Diagnoses Final diagnoses:  Hypoglycemia     @PCDICTATION @    Maciah Feeback, Debe Coder, MD 06/27/22 1529

## 2022-06-26 NOTE — ED Notes (Signed)
PATIENT STOOD FROM WHEELCHAIR TO STRETCHER ON HER OWN, STEADY GAIT.

## 2022-06-26 NOTE — ED Notes (Signed)
Unable to obtain IV access at this time. Notified Kommer MD.

## 2022-06-26 NOTE — ED Notes (Signed)
Patient ambulated to bathroom and back to stretcher, states she still felt a little dizzy while ambulating.

## 2022-06-28 ENCOUNTER — Emergency Department (HOSPITAL_COMMUNITY)
Admission: EM | Admit: 2022-06-28 | Discharge: 2022-06-28 | Disposition: A | Discharge status: Home / Self Care | Payer: Medicaid Other | Attending: Emergency Medicine | Admitting: Emergency Medicine

## 2022-06-28 ENCOUNTER — Other Ambulatory Visit: Payer: Self-pay

## 2022-06-28 ENCOUNTER — Encounter (HOSPITAL_COMMUNITY): Payer: Self-pay

## 2022-06-28 DIAGNOSIS — I10 Essential (primary) hypertension: Secondary | ICD-10-CM | POA: Insufficient documentation

## 2022-06-28 DIAGNOSIS — D509 Iron deficiency anemia, unspecified: Secondary | ICD-10-CM | POA: Diagnosis not present

## 2022-06-28 DIAGNOSIS — Z7984 Long term (current) use of oral hypoglycemic drugs: Secondary | ICD-10-CM | POA: Insufficient documentation

## 2022-06-28 DIAGNOSIS — Z79899 Other long term (current) drug therapy: Secondary | ICD-10-CM | POA: Insufficient documentation

## 2022-06-28 DIAGNOSIS — Z7982 Long term (current) use of aspirin: Secondary | ICD-10-CM | POA: Insufficient documentation

## 2022-06-28 DIAGNOSIS — E162 Hypoglycemia, unspecified: Secondary | ICD-10-CM

## 2022-06-28 DIAGNOSIS — Z794 Long term (current) use of insulin: Secondary | ICD-10-CM | POA: Diagnosis not present

## 2022-06-28 DIAGNOSIS — D649 Anemia, unspecified: Secondary | ICD-10-CM

## 2022-06-28 DIAGNOSIS — R42 Dizziness and giddiness: Secondary | ICD-10-CM | POA: Diagnosis present

## 2022-06-28 DIAGNOSIS — E11649 Type 2 diabetes mellitus with hypoglycemia without coma: Secondary | ICD-10-CM | POA: Insufficient documentation

## 2022-06-28 LAB — CBC WITH DIFFERENTIAL/PLATELET
Abs Immature Granulocytes: 0.02 10*3/uL (ref 0.00–0.07)
Basophils Absolute: 0 10*3/uL (ref 0.0–0.1)
Basophils Relative: 0 %
Eosinophils Absolute: 0.1 10*3/uL (ref 0.0–0.5)
Eosinophils Relative: 1 %
HCT: 25.1 % — ABNORMAL LOW (ref 36.0–46.0)
Hemoglobin: 7.2 g/dL — ABNORMAL LOW (ref 12.0–15.0)
Immature Granulocytes: 0 %
Lymphocytes Relative: 19 %
Lymphs Abs: 1.2 10*3/uL (ref 0.7–4.0)
MCH: 20.5 pg — ABNORMAL LOW (ref 26.0–34.0)
MCHC: 28.7 g/dL — ABNORMAL LOW (ref 30.0–36.0)
MCV: 71.5 fL — ABNORMAL LOW (ref 80.0–100.0)
Monocytes Absolute: 0.5 10*3/uL (ref 0.1–1.0)
Monocytes Relative: 8 %
Neutro Abs: 4.5 10*3/uL (ref 1.7–7.7)
Neutrophils Relative %: 72 %
Platelets: 495 10*3/uL — ABNORMAL HIGH (ref 150–400)
RBC: 3.51 MIL/uL — ABNORMAL LOW (ref 3.87–5.11)
RDW: 19.1 % — ABNORMAL HIGH (ref 11.5–15.5)
WBC: 6.3 10*3/uL (ref 4.0–10.5)
nRBC: 0 % (ref 0.0–0.2)

## 2022-06-28 LAB — URINALYSIS, ROUTINE W REFLEX MICROSCOPIC
Bilirubin Urine: NEGATIVE
Glucose, UA: NEGATIVE mg/dL
Ketones, ur: NEGATIVE mg/dL
Nitrite: NEGATIVE
Protein, ur: 100 mg/dL — AB
Specific Gravity, Urine: 1.02 (ref 1.005–1.030)
WBC, UA: >50 WBC/hpf (ref 0–5)
pH: 5 (ref 5.0–8.0)

## 2022-06-28 LAB — PREGNANCY, URINE: Preg Test, Ur: NEGATIVE

## 2022-06-28 LAB — COMPREHENSIVE METABOLIC PANEL
ALT: 11 U/L (ref 0–44)
AST: 22 U/L (ref 15–41)
Albumin: 2.9 g/dL — ABNORMAL LOW (ref 3.5–5.0)
Alkaline Phosphatase: 59 U/L (ref 38–126)
Anion gap: 13 (ref 5–15)
BUN: 17 mg/dL (ref 6–20)
CO2: 23 mmol/L (ref 22–32)
Calcium: 8.6 mg/dL — ABNORMAL LOW (ref 8.9–10.3)
Chloride: 99 mmol/L (ref 98–111)
Creatinine, Ser: 0.65 mg/dL (ref 0.44–1.00)
GFR, Estimated: >60 mL/min (ref >60)
Glucose, Bld: 227 mg/dL — ABNORMAL HIGH (ref 70–99)
Potassium: 3.6 mmol/L (ref 3.5–5.1)
Sodium: 135 mmol/L (ref 135–145)
Total Bilirubin: <0.1 mg/dL — ABNORMAL LOW (ref 0.3–1.2)
Total Protein: 6.8 g/dL (ref 6.5–8.1)

## 2022-06-28 LAB — CBG MONITORING, ED: Glucose-Capillary: 186 mg/dL — ABNORMAL HIGH (ref 70–99)

## 2022-06-28 LAB — OCCULT BLOOD X 1 CARD TO LAB, STOOL: Fecal Occult Bld: NEGATIVE

## 2022-06-28 LAB — POC URINE PREG, ED: Preg Test, Ur: NEGATIVE

## 2022-06-28 MED ORDER — FERROUS SULFATE 300 (60 FE) MG/5ML PO SOLN: 300.0000 mg | Freq: Every day | ORAL | 0 refills | Status: AC

## 2022-06-28 NOTE — Discharge Instructions (Addendum)
After a fall, you had low blood sugar that got better along with your symptoms after bring your blood sugar up.  Call your doctor Monday morning to see if they want to adjust your insulin.  Make sure you are eating regularly throughout the day and checking your blood sugar as directed.  Having very low blood sugar can be dangerous.    Make sure you also follow-up with your colonoscopy as scheduled on April 5.  Your blood counts are low.  I have prescribed the iron supplement as you requested in liquid form for you to start taking this to help improve your blood count.  Back to the ER for any new or worsening symptoms.

## 2022-06-28 NOTE — ED Provider Notes (Signed)
[facility] Provider Note   CSN: [id number] Arrival date & time: 06/28/22  1251     History  Chief Complaint  Patient presents with   [name]    Sakai Strande is a 47 y.o. female.  She has history of DiGeorge syndrome, ectrodactyly, hypertension, high cholesterol and diabetes on insulin and metformin.  Presents the ER today for a fall.  She arrived via EMS.  She states that she is feeling dizzy and her right arm and left leg "felt weird" and she fell down.  EMS was called and checked her blood sugar which was 66.  They gave her oral glucose and some [place] punch and blood sugar on recheck was still 64 so gave her D10, some went blood sugar was in the 200s, patient is amatory with no complaints at this time.  Symptoms completely resolved.  She states she is similar episode a couple days ago.  She was seen in the ED it was a long for the same visit.  She denies head injury or loss consciousness, states she felt forward to her hands and ears.  Denies loss consciousness, she not on blood thinners.   Fall       Home Medications Prior to Admission medications   Medication Sig Start Date End Date Taking? Authorizing Provider  ferrous sulfate 300 (60 Fe) MG/5ML syrup Take 5 mLs (300 mg total) by mouth daily. 06/28/22  Yes Davarious Tumbleson A, PA-C  Insulin Syringe-Needle U-100 30G X 5/16" 0.5 ML MISC Use to administer insulin 2 (two) times daily. 07/03/21   Elsie Stain, MD  Accu-Chek Softclix Lancets lancets Use 3 (three) times daily as directed 08/08/21   Elsie Stain, MD  Acetaminophen 500 MG capsule Take 500-1,000 mg by mouth every 6 (six) hours as needed for pain.    [provider]  ASPIRIN LOW DOSE 81 MG EC tablet Take 1 tablet (81 mg total) by mouth daily. 07/24/21   Elsie Stain, MD  Blood Glucose Monitoring Suppl (ACCU-CHEK GUIDE) w/Device KIT Use to check blood sugar 3 (three) times daily. 07/03/21   Elsie Stain, MD  EASY COMFORT PEN NEEDLES 31G X 5 MM MISC USE TWICE A DAY WITH INSULIN PEN 12/30/17   [provider]  fluticasone (FLONASE) 50 MCG/ACT nasal spray Place 1 spray into both nostrils daily. Patient taking differently: Place 1 spray into both nostrils daily as needed for allergies or rhinitis. 08/01/21   Elodia Florence., MD  glucose blood (ACCU-CHEK GUIDE) test strip Use 3 (three) times daily as instructed 07/03/21   Elsie Stain, MD  insulin glargine (LANTUS) 100 UNIT/ML injection Inject 0.3 mLs (30 Units total) into the skin in the morning AND 0.2 mLs (20 Units total) every evening. Patient taking differently: Inject 40 units into the skin in the morning and 30 units at bedtime 07/03/21   Elsie Stain, MD  Insulin Syringe-Needle U-100 (INSULIN SYRINGE .5CC/31GX5/16") 31G X 5/16" 0.5 ML MISC Inject 1 Syringe into the skin daily. 09/18/21   Elsie Stain, MD  Lancets Misc. (ACCU-CHEK SOFTCLIX LANCET DEV) KIT Use to check blood sugar twice a day 07/24/21   Elsie Stain, MD  metFORMIN (GLUCOPHAGE) 500 MG tablet Take 1 tablet (500 mg total) by mouth 2 (two) times daily with a meal. 07/24/21   Elsie Stain, MD  omeprazole (PRILOSEC) 20 MG capsule Take 1 capsule (20 mg total) by mouth daily. Patient taking  differently: Take 20 mg by mouth daily before breakfast. 06/26/17   Avie Echevaria B, PA-C  ondansetron (ZOFRAN) 4 MG tablet Take 1 tablet (4 mg total) by mouth every 6 (six) hours. Patient not taking: Reported on 12/17/2021 02/07/17   Nat Christen, MD  pravastatin (PRAVACHOL) 20 MG tablet Take 20 mg by mouth daily.    [provider]  silver sulfADIAZINE (SILVADENE) 1 % cream Apply 1 application topically to wound on both ankles once daily. Patient not taking: Reported on 12/17/2021 07/03/21   Elsie Stain, MD      Allergies    Patient has no known allergies.    Review of Systems   Review of Systems  Physical Exam Updated Vital Signs BP (!)  146/85   Pulse (!) 101   Temp 98.3 F (36.8 C) (Oral)   Resp 18   Ht 4\' 9"  (1.448 m)   Wt 49.9 kg   SpO2 100%   BMI 23.81 kg/m  Physical Exam Vitals and nursing note reviewed. Exam conducted with a chaperone present.  Constitutional:      General: She is not in acute distress.    Appearance: She is well-developed.  HENT:     Head: Normocephalic and atraumatic.     Mouth/Throat:     Mouth: Mucous membranes are moist.  Eyes:     Conjunctiva/sclera: Conjunctivae normal.  Cardiovascular:     Rate and Rhythm: Normal rate and regular rhythm.     Heart sounds: No murmur heard. Pulmonary:     Effort: Pulmonary effort is normal. No respiratory distress.     Breath sounds: Normal breath sounds.  Abdominal:     Palpations: Abdomen is soft.     Tenderness: There is no abdominal tenderness.  Genitourinary:    Rectum: Normal.     Comments: Stool is brown Musculoskeletal:        General: No swelling.     Cervical back: Neck supple.  Skin:    General: Skin is warm and dry.     Capillary Refill: Capillary refill takes less than 2 seconds.  Neurological:     General: No focal deficit present.     Mental Status: She is alert and oriented to person, place, and time. Mental status is at baseline.     Cranial Nerves: No cranial nerve deficit.     Gait: Gait normal.  Psychiatric:        Mood and Affect: Mood normal.     ED Results / Procedures / Treatments   Labs (all labs ordered are listed, but only abnormal results are displayed) Labs Reviewed  CBC WITH DIFFERENTIAL/PLATELET - Abnormal; Notable for the following components:      Result Value   RBC 3.51 (*)    Hemoglobin 7.2 (*)    HCT 25.1 (*)    MCV 71.5 (*)    MCH 20.5 (*)    MCHC 28.7 (*)    RDW 19.1 (*)    Platelets 495 (*)    All other components within normal limits  COMPREHENSIVE METABOLIC PANEL - Abnormal; Notable for the following components:   Glucose, Bld 227 (*)    Calcium 8.6 (*)    Albumin 2.9 (*)     Total Bilirubin <0.1 (*)    All other components within normal limits  URINALYSIS, ROUTINE W REFLEX MICROSCOPIC - Abnormal; Notable for the following components:   Color, Urine AMBER (*)    APPearance CLOUDY (*)    Hgb urine dipstick MODERATE (*)  Protein, ur 100 (*)    Leukocytes,Ua LARGE (*)    Bacteria, UA MANY (*)    All other components within normal limits  CBG MONITORING, ED - Abnormal; Notable for the following components:   Glucose-Capillary 186 (*)    All other components within normal limits  URINE CULTURE  OCCULT BLOOD X 1 CARD TO LAB, STOOL  PREGNANCY, URINE  POC URINE PREG, ED  POC OCCULT BLOOD, ED    EKG EKG Interpretation  Date/Time:  Saturday June 28 2022 13:16:30 EDT Ventricular Rate:  100 PR Interval:  122 QRS Duration: 79 QT Interval:  330 QTC Calculation: 426 R Axis:   56 Text Interpretation: Sinus tachycardia No significant change since last tracing Confirmed by Deno Etienne [phone]) on 06/28/2022 1:21:12 PM  Radiology No results found.  Procedures Procedures    Medications Ordered in ED Medications - No data to display  ED Course/ Medical Decision Making/ A&P                             Medical Decision Making This patient presents to the ED for concern of blood sugar with fall, this involves an extensive number of treatment options, and is a complaint that carries with it a high risk of complications and morbidity.  The differential diagnosis includes hypoglycemia, symptomatic anemia,dehydration, other   Co morbidities that complicate the patient evaluation  DiGeorge syndrome, type 2 DM, anemia, other   Additional history obtained:  Additional history obtained from EMR External records from outside source obtained and reviewed including the ED visit, multiple prior discharge summaries including previous notes mentioning patient's low iron with microcytic anemia recommending outpatient colonoscopy.  Multiple prior urine cultures  reviewed showing 1 with group B strep, remainder were multiple organisms.    Lab Tests:  I Ordered, and personally interpreted labs.  The pertinent results include: Hemoglobin is 7.2 today, dropped from 8.1 just a couple of days ago, overall shows a microcytic anemia with slightly increased platelets, urinalysis shows cloudy urine with greater than 50 white blood cells, white blood cell clumps, 11-20 red blood cells and 11-20 squamous cells     Cardiac Monitoring: / EKG:  The patient was maintained on a cardiac monitor.  I personally viewed and interpreted the cardiac monitored which showed an underlying rhythm of: sinus rhythm    Problem List / ED Course / Critical interventions / Medication management  Patient came in for fall noted to have low blood sugar and was completely asymptomatic after getting oral glucose.  Sugars remained stable, she remained asymptomatic, eating a cheeseburger at time of discharge.  She is able to ambulate well, we did review some additional labs.  Noted to be more anemic than previous, rectal is negative, patient is not hypotensive or tachycardic.  I do not feel she needs blood transfusion at this time, will receive her return for oral iron supplementation and she has a colonoscopy scheduled for April 5.  Advised on close outpatient follow-up and strict return precautions for this.  Discussed with her following up close with her doctor about her insulin, advised to increase she is eating of carbohydrates to prevent repeat blood sugar lows due to this being her second visit for this in the past several days. Urinalysis was contaminated.  Patient is not having dysuria, culture sent.  Prior cultures have shown mixed organisms.  She has no chest pain, no shortness of breath.  No fevers or chills,  she is not pregnant.  Renal function is normal.  Reevaluation of the patient after eating food showed that the patient resolved I have reviewed the patients home medicines  and have made adjustments as needed      Amount and/or Complexity of Data Reviewed Labs: ordered.  Risk OTC drugs.           Final Clinical Impression(s) / ED Diagnoses Final diagnoses:  Hypoglycemia  Anemia, unspecified type    Rx / DC Orders ED Discharge Orders          Ordered    ferrous sulfate 300 (60 Fe) MG/5ML syrup  Daily        06/28/22 1452              Gwenevere Abbot, PA-C 06/28/22 [place], Dan, DO 06/28/22 1938

## 2022-06-28 NOTE — ED Triage Notes (Signed)
Pt present to ED from home d/t fall. Pt denies LOC, blood thinner meds. Pt noted with blood glucose 60 per EMS, pt received oral glucose and juice increasing blood glucose 64. IV inserted, D10 50cc administered. Pt A&Ox4 at this time.

## 2022-06-30 LAB — URINE CULTURE: Culture: 10000 — AB

## 2022-07-01 DIAGNOSIS — E1110 Type 2 diabetes mellitus with ketoacidosis without coma: Secondary | ICD-10-CM

## 2022-07-01 LAB — GLUCOSE, POCT (MANUAL RESULT ENTRY): POC Glucose: 216 mg/dl — AB (ref 70–99)

## 2022-07-01 NOTE — Congregational Nurse Program (Signed)
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 Yes    Student Assistance N/A    Location Patient [place]    Visit Setting with Client Organization    Patient Status Unknown   Moved into apartment at [facility] on 03/07/2022   Insurance Medicaid    Insurance/Financial Assistance Referral N/A    Medication N/A    Medical Provider Yes    Screening Referrals Made N/A    Medical Referrals Made N/A    Medical Appointment Made N/A    Recently w/o PCP, now 1st time PCP visit completed due to CNs referral or appointment made N/A    Food Have Food Insecurities    Transportation Need transportation assistance    Housing/Utilities N/A    Interpersonal Safety N/A    Interventions Advocate/Support;Counsel;Educate;Reviewed Medications;Case Management    Abnormal to Normal Screening Since Last CN Visit N/A    Screenings CN Performed Blood Glucose    Sent Client to Lab for: N/A    Did client attend any of the following based off CNs referral or appointments made? N/A    ED Visit Averted N/A    Life-Saving Intervention Made N/A               Dept: [phone]   Congregational Nurse Program Note  Date of Encounter: 07/01/2022  Clinic visit to assist with Trulicity injection, she is able to give injection with limited assistance but needs reminders of steps to  ensure proper and safe injection of the medication.    Discussed two recent ER visit for blood glucose levels in the 60s that result after taking insulin in the evening without proper intake of food.  Reviewed time to take insulin to decrease trips to ER. Past Medical History: Past Medical History:  Diagnosis Date   Diabetes mellitus without complication ([place])    DiGeorge syndrome (HCC)    Ectrodactyly    Genetic defect    genetic mutation of unknown significance per [facility] genetic notes   GERD (gastroesophageal reflux disease)    Hypercholesterolemia    Hypertension    Intellectual disability    Microcephaly ([place])    Pelvic mass    Ventral hernia     Encounter Details:  CNP Questionnaire - 07/01/22 1647       Questionnaire   Ask client: Do you give verbal consent for me to treat you today? Yes    Student Assistance N/A    Location Patient [place]    Visit Setting with Client Organization    Patient Status Unknown   Moved into apartment at [facility] on 03/07/2022   Insurance Medicaid    Insurance/Financial Assistance Referral N/A    Medication N/A    Medical Provider Yes    Screening Referrals Made N/A  Medical Referrals Made N/A    Medical Appointment Made N/A    Recently w/o PCP, now 1st time PCP visit completed due to CNs referral or appointment made N/A    Food Have Food Insecurities    Transportation Need transportation assistance    Housing/Utilities N/A    Interpersonal Safety N/A    Interventions Advocate/Support;Counsel;Educate;Reviewed Medications;Case Management    Abnormal to Normal Screening Since Last CN Visit N/A    Screenings CN Performed Blood Glucose    Sent Client to Lab for: N/A    Did client attend any of the following based off CNs referral or appointments made? N/A    ED Visit Averted N/A    Life-Saving Intervention Made N/A

## 2022-07-08 NOTE — Congregational Nurse Program (Signed)
  Dept: [phone]   Congregational Nurse Program Note  Date of Encounter: 07/08/2022  Clinic visit to have assistance with Trulicity injection and for questions about colostomy scheduled for 07/14/2022.  Had taken insulin just before clinic visit after blood sugar check of 149 per Accucheck glucometer.  Assisted with giving Trulicity [id number] mg in left lower quadrant of abdomen.   Educated regarding general preparations for a colostomy as her specific instruction sheet was not brought to clinic.  Given instructions for mixing Golytely and need to drink all of the 4 liters after mixing with water.  Advised to call MD office in AM if questions about any of preparation instructions. Past Medical History: Past Medical History:  Diagnosis Date   Diabetes mellitus without complication ([place])    DiGeorge syndrome (HCC)    Ectrodactyly    Genetic defect    genetic mutation of unknown significance per [facility] genetic notes   GERD (gastroesophageal reflux disease)    Hypercholesterolemia    Hypertension    Intellectual disability    Microcephaly ([place])    Pelvic mass    Ventral hernia     Encounter Details:  CNP Questionnaire - 07/08/22 1740       Questionnaire   Ask client: Do you give verbal consent for me to treat you today? Yes    Student Assistance N/A    Location Patient [place]    Visit Setting with Client Organization    Patient Status Unknown   Moved into apartment at [facility] on 03/07/2022   Insurance Medicaid    Insurance/Financial Assistance Referral N/A    Medication N/A    Medical Provider Yes    Screening Referrals Made N/A    Medical Referrals Made N/A    Medical Appointment Made N/A    Recently w/o PCP, now 1st time PCP visit completed due to CNs referral or appointment made N/A    Food Have Food Insecurities    Transportation Need transportation assistance    Housing/Utilities N/A    Interpersonal Safety N/A    Interventions  Advocate/Support;Counsel;Educate;Reviewed Medications;Case Management    Abnormal to Normal Screening Since Last CN Visit N/A    Screenings CN Performed Blood Pressure;Pulse Ox    Sent Client to Lab for: N/A    Did client attend any of the following based off CNs referral or appointments made? N/A    ED Visit Averted N/A    Life-Saving Intervention Made N/A

## 2022-07-23 NOTE — Congregational Nurse Program (Signed)
  Dept: [phone]   Congregational Nurse Program Note  Date of Encounter: 07/15/2022  Clinic visit to assist with Trulicity injection.  Required reminders to remember steps to give injection, was able to give injection in lower left quadrant of abdomen with prompts.  Discussed need to eat vegetables and less fried and take out meals to manage diabetes. Past Medical History: Past Medical History:  Diagnosis Date   Diabetes mellitus without complication (HCC)    DiGeorge syndrome (HCC)    Ectrodactyly    Genetic defect    genetic mutation of unknown significance per [facility] genetic notes   GERD (gastroesophageal reflux disease)    Hypercholesterolemia    Hypertension    Intellectual disability    Microcephaly (HCC)    Pelvic mass    Ventral hernia     Encounter Details:  CNP Questionnaire - 07/15/22 1705       Questionnaire   Ask client: Do you give verbal consent for me to treat you today? Yes    Student Assistance N/A    Location Patient [place]    Visit Setting with Client Organization    Patient Status Unknown   Moved into apartment at [facility] on 03/07/2022   Insurance Medicaid    Insurance/Financial Assistance Referral N/A    Medication N/A    Medical Provider Yes    Screening Referrals Made N/A    Medical Referrals Made N/A    Medical Appointment Made N/A    Recently w/o PCP, now 1st time PCP visit completed due to CNs referral or appointment made N/A    Food Have Food Insecurities    Transportation Need transportation assistance    Housing/Utilities N/A    Interpersonal Safety N/A    Interventions Advocate/Support;Counsel;Educate;Case Management    Abnormal to Normal Screening Since Last CN Visit N/A    Screenings CN Performed N/A    Sent Client to Lab for: N/A    Did client attend any of the following based off CNs referral or appointments made? N/A    ED Visit Averted N/A    Life-Saving Intervention Made N/A

## 2022-07-24 ENCOUNTER — Emergency Department (HOSPITAL_COMMUNITY)
Admission: EM | Admit: 2022-07-24 | Discharge: 2022-07-24 | Disposition: A | Discharge status: Home / Self Care | Payer: Medicaid Other | Attending: Emergency Medicine | Admitting: Emergency Medicine

## 2022-07-24 ENCOUNTER — Encounter (HOSPITAL_COMMUNITY): Payer: Self-pay

## 2022-07-24 ENCOUNTER — Emergency Department (HOSPITAL_COMMUNITY): Payer: Medicaid Other

## 2022-07-24 DIAGNOSIS — E119 Type 2 diabetes mellitus without complications: Secondary | ICD-10-CM | POA: Insufficient documentation

## 2022-07-24 DIAGNOSIS — R110 Nausea: Secondary | ICD-10-CM | POA: Diagnosis not present

## 2022-07-24 DIAGNOSIS — R1013 Epigastric pain: Secondary | ICD-10-CM | POA: Diagnosis not present

## 2022-07-24 DIAGNOSIS — R630 Anorexia: Secondary | ICD-10-CM | POA: Insufficient documentation

## 2022-07-24 DIAGNOSIS — K219 Gastro-esophageal reflux disease without esophagitis: Secondary | ICD-10-CM | POA: Insufficient documentation

## 2022-07-24 DIAGNOSIS — R109 Unspecified abdominal pain: Secondary | ICD-10-CM | POA: Diagnosis present

## 2022-07-24 DIAGNOSIS — R197 Diarrhea, unspecified: Secondary | ICD-10-CM | POA: Insufficient documentation

## 2022-07-24 DIAGNOSIS — R42 Dizziness and giddiness: Secondary | ICD-10-CM | POA: Diagnosis not present

## 2022-07-24 LAB — CBC
HCT: 29.3 % — ABNORMAL LOW (ref 36.0–46.0)
Hemoglobin: 8.6 g/dL — ABNORMAL LOW (ref 12.0–15.0)
MCH: 20.9 pg — ABNORMAL LOW (ref 26.0–34.0)
MCHC: 29.4 g/dL — ABNORMAL LOW (ref 30.0–36.0)
MCV: 71.1 fL — ABNORMAL LOW (ref 80.0–100.0)
Platelets: 580 10*3/uL — ABNORMAL HIGH (ref 150–400)
RBC: 4.12 MIL/uL (ref 3.87–5.11)
RDW: 18.6 % — ABNORMAL HIGH (ref 11.5–15.5)
WBC: 6.5 10*3/uL (ref 4.0–10.5)
nRBC: 0 % (ref 0.0–0.2)

## 2022-07-24 LAB — BASIC METABOLIC PANEL
Anion gap: 13 (ref 5–15)
BUN: 19 mg/dL (ref 6–20)
CO2: 22 mmol/L (ref 22–32)
Calcium: 9.1 mg/dL (ref 8.9–10.3)
Chloride: 103 mmol/L (ref 98–111)
Creatinine, Ser: 0.91 mg/dL (ref 0.44–1.00)
GFR, Estimated: >60 mL/min (ref >60)
Glucose, Bld: 166 mg/dL — ABNORMAL HIGH (ref 70–99)
Potassium: 3.5 mmol/L (ref 3.5–5.1)
Sodium: 138 mmol/L (ref 135–145)

## 2022-07-24 LAB — URINALYSIS, ROUTINE W REFLEX MICROSCOPIC
Bilirubin Urine: NEGATIVE
Glucose, UA: NEGATIVE mg/dL
Ketones, ur: 20 mg/dL — AB
Nitrite: NEGATIVE
Protein, ur: 100 mg/dL — AB
Specific Gravity, Urine: 1.021 (ref 1.005–1.030)
WBC, UA: >50 WBC/hpf (ref 0–5)
pH: 5 (ref 5.0–8.0)

## 2022-07-24 LAB — CBG MONITORING, ED: Glucose-Capillary: 160 mg/dL — ABNORMAL HIGH (ref 70–99)

## 2022-07-24 LAB — HEPATIC FUNCTION PANEL
ALT: 10 U/L (ref 0–44)
AST: 15 U/L (ref 15–41)
Albumin: 3.3 g/dL — ABNORMAL LOW (ref 3.5–5.0)
Alkaline Phosphatase: 77 U/L (ref 38–126)
Bilirubin, Direct: <0.1 mg/dL (ref 0.0–0.2)
Total Bilirubin: 0.9 mg/dL (ref 0.3–1.2)
Total Protein: 8 g/dL (ref 6.5–8.1)

## 2022-07-24 LAB — I-STAT BETA HCG BLOOD, ED (MC, WL, AP ONLY): I-stat hCG, quantitative: <5 m[IU]/mL (ref <5)

## 2022-07-24 LAB — LIPASE, BLOOD: Lipase: 28 U/L (ref 11–51)

## 2022-07-24 MED ORDER — IOHEXOL 350 MG/ML SOLN
75.0000 mL | Freq: Once | INTRAVENOUS | Status: AC | PRN
  Administered 2022-07-24: 75 mL via INTRAVENOUS

## 2022-07-24 MED ORDER — LACTATED RINGERS IV BOLUS
1000.0000 mL | Freq: Once | INTRAVENOUS | Status: AC
  Administered 2022-07-24: 1000 mL via INTRAVENOUS

## 2022-07-24 NOTE — Discharge Instructions (Addendum)
[name]  Thank you for allowing [place] to take care of you today.  You came to the Emergency Department today because you have had some nausea, diarrhea, dizziness.  Here in the emergency department, we did a CT of your belly that did not show any inflammation of any of your organs, you are able to eat and drink, and we did labs that showed that your kidney, liver, pancreas numbers were good, you do not have a urine infection.  Your C&S went away after you got some fluids, it is likely that you are mildly dehydrated.  We recommend increasing your intake of fluids over the next several days at home.Marland Kitchen   To-Do: 1. Please follow-up with your primary doctor within 2 days / as soon as possible.   Please return to the Emergency Department or call 911 if you experience have worsening of your symptoms, or do not get better, chest pain, shortness of breath, severe or significantly worsening pain, high fever, severe confusion, pass out or have any reason to think that you need emergency medical care.   We hope you feel better soon.   Curley Spice, MD Department of Emergency Medicine [facility]

## 2022-07-24 NOTE — ED Provider Notes (Signed)
[place] EMERGENCY DEPARTMENT AT [facility] Provider Note  Medical Decision Making   HPI: [name] is a 47 y.o. female with history perinent for diabetes, GERD, UTI, intellectual disability who presents complaining of epigastric pain and dizziness. History provided by patient.  No interpreter required for this encounter  Patient reports that intermittently over the past week she has had intermittent abdominal pain, describes it as achy.  Endorses intermittent nausea, denies vomiting, also reports that she has had diarrhea since 4/16.  Reports that she also has intermittent dizziness upon standing, reports that this has been new over the past week since she has had decreased p.o. intake.  States that she is concerned that her blood sugar is low.  ROS: As per HPI. Please see MAR for complete past medical history, surgical history, and social history.   Physical exam is pertinent for mild epigastric tenderness, mildly delayed capillary refill, no focal neurologic deficits  The differential includes but is not limited to pancreatitis, AKI, electrolyte derangement, presyncope, dehydration, UTI, bowel obstruction, DKA, HHS, hypoglycemia.  Additional history obtained from: None External records from outside source obtained and reviewed including: Not indicated  ED provider interpretation of ECG: Rate 107, sinus rhythm, nonspecific T wave inversions in lead V1, aVL, no ST elevations or depressions  ED provider interpretation of radiology/imaging: Reviewed CT of the abdomen pelvis, I do not appreciate intra-abdominal free air, significant intra-abdominal free fluid, air-fluid levels, radiopaque nephrolithiasis, lateral wall thickening, I do appreciate lobulated uterus.  Labs ordered were interpreted by myself as well as my attending and were incorporated into the medical decision making process for this patient.  ED provider interpretation of labs: Blood glucose WNL.  BMP  without AKI or emergent electrolyte derangement.  CBC without leukocytosis, mild thrombocytosis, anemia overall similar to baseline.  UA without UTI.  Hepatic function panel WNL.  Beta-hCG negative.  Interventions: LR  See the EMR for full details regarding lab and imaging results.  Overall well-appearing on exam, now peritonitic abdomen, she does have mildly decreased capillary refill, reports decreased p.o. intake as well as lightheadedness upon standing, concerning for mild hypovolemia.  Reviewed labs, added on LFTs.  No AKI or emergent electrolyte derangement, given labs doubt DKA, HHS.  No definitive UTI, patient without dysuria, do not feel that patient requires treatment at this time.  Lipase WNL, doubt pancreatitis.  CT abdomen pelvis without emergent intra-abdominal derangement.  On the ED patient did tolerate peanut butter and crackers as well as a soda without difficulty.  After fluid bolus patient reports that she has resolution of dizziness, denies epigastric pain.  Given reassuring labs, imaging, patient tolerating p.o., resolution of dizziness, feel that patient is stable for discharge with outpatient follow-up.  Patient is amenable to this plan.  Consults: None  Disposition: DISCHARGE: I believe that the patient is safe for discharge home with outpatient follow-up. Patient was informed of all pertinent physical exam, laboratory, and imaging findings. Patient's suspected etiology of their symptom presentation was discussed with the patient and all questions were answered. We discussed following up with PCP. I provided thorough ED return precautions. The patient feels safe and comfortable with this plan.  The plan for this patient was discussed with Dr. Lockie Mola, who voiced agreement and who oversaw evaluation and treatment of this patient.  Clinical Impression:  1. Dizziness   2. Epigastric pain    Discharge  Therapies: These medications and interventions were provided for the  patient while in the ED. Medications  lactated ringers bolus 1,000 mL (0 mLs Intravenous Stopped 07/24/22 2111)  iohexol (OMNIPAQUE) 350 MG/ML injection 75 mL (75 mLs Intravenous Contrast Given 07/24/22 1734)    MDM generated using voice dictation software and may contain dictation errors.  Please contact me for any clarification or with any questions.  Clinical Complexity A medically appropriate history, review of systems, and physical exam was performed.  Collateral history obtained from: None I personally reviewed the labs, EKG, imaging as discussed above. Patient's presentation is most consistent with acute complicated illness / injury requiring diagnostic workup Considered and ruled out life and body threatening conditions  Treatment: Outpatient follow-up Medications: Prescription Discussed patient's care with providers from the following different specialties: None  Physical Exam   ED Triage Vitals [07/24/22 1337]  Enc Vitals Group     BP 92/61     Pulse Rate (!) 102     Resp 18     Temp 98.1 F (36.7 C)     Temp src      SpO2 100 %     Weight      Height      Head Circumference      Peak Flow      Pain Score 8     Pain Loc      Pain Edu?      Excl. in GC?      Physical Exam Vitals and nursing note reviewed.  Constitutional:      General: She is not in acute distress.    Appearance: She is well-developed.  HENT:     Head: Normocephalic and atraumatic.  Eyes:     Conjunctiva/sclera: Conjunctivae normal.  Cardiovascular:     Rate and Rhythm: Normal rate and regular rhythm.     Heart sounds: No murmur heard. Pulmonary:     Effort: Pulmonary effort is normal. No respiratory distress.     Breath sounds: Normal breath sounds.  Abdominal:     Palpations: Abdomen is soft.     Tenderness: There is abdominal tenderness in the epigastric area. There is no right CVA tenderness, left CVA tenderness, guarding or rebound. Negative signs include Murphy's sign and  McBurney's sign.  Musculoskeletal:        General: No swelling.     Cervical back: Neck supple.     Comments: Congenital deformities of hands.  Skin:    General: Skin is warm and dry.     Capillary Refill: Capillary refill takes 2 to 3 seconds.  Neurological:     Mental Status: She is alert.  Psychiatric:        Mood and Affect: Mood normal.       Procedure Note  Procedures  CT ABDOMEN PELVIS W CONTRAST  Final Result      Julianne Rice, MD Emergency Medicine, PGY-2   Curley Spice, MD 07/24/22 2328    Virgina Norfolk, DO 07/24/22 2341

## 2022-07-24 NOTE — ED Triage Notes (Addendum)
Pt bib ems form home, lives by self; c/o dizziness and upper abd pain, intermittent x 1 week; both worse with ambulation; also c/o loss of appetite x 4 months; no neuro deficits with ems; hx intellectual disability, slow to respond at baseline; denies N/V; denies urinary issues;  96/54, HR 106, 100% RA; states she feels this way when blood sugar low; hx DM

## 2022-07-24 NOTE — ED Provider Notes (Signed)
I have personally seen and examined the patient. I have reviewed the documentation on PMH/FH/Soc Hx. I have discussed the plan of care with the resident and patient.  I have reviewed and agree with the resident's documentation. Please see associated encounter note.  Briefly, the patient is a 47 y.o. female here with abdominal pain.  History of gallbladder surgery.  She has been having some right-sided abdominal pain.  No nausea or vomiting.  Differential diagnosis is bowel obstruction versus muscular process versus kidney infection versus less likely kidney stone.  Will get CBC, CMP, lipase urinalysis and CT scan abdomen pelvis.  CT report per radiology is unremarkable.  Per my review and interpretation of labs there is no significant findings.  Overall suspect gas related pain or may be muscular related pain.  Will treat urine if infected.  Please see resident note for further results, evaluation, disposition of the patient.  This chart was dictated using voice recognition software.  Despite best efforts to proofread,  errors can occur which can change the documentation meaning.    EKG Interpretation  Date/Time:  Thursday July 24 2022 13:51:39 EDT Ventricular Rate:  107 PR Interval:  118 QRS Duration: 72 QT Interval:  332 QTC Calculation: 443 R Axis:   78 Text Interpretation: Sinus tachycardia Otherwise normal ECG When compared with ECG of 28-Jun-2022 13:16, PREVIOUS ECG IS PRESENT Confirmed by Virgina Norfolk ([id number]) on 07/24/2022 3:11:27 PM          Virgina Norfolk, DO 07/24/22 1811

## 2022-08-05 ENCOUNTER — Encounter: Payer: Self-pay | Admitting: *Deleted

## 2022-08-05 NOTE — Congregational Nurse Program (Signed)
  Dept: [phone]   Congregational Nurse Program Note  Date of Encounter: 08/05/2022  Past Medical History: Past Medical History:  Diagnosis Date   Diabetes mellitus without complication (HCC)    DiGeorge syndrome (HCC)    Ectrodactyly    Genetic defect    genetic mutation of unknown significance per [facility] genetic notes   GERD (gastroesophageal reflux disease)    Hypercholesterolemia    Hypertension    Intellectual disability    Microcephaly (HCC)    Pelvic mass    Ventral hernia     Encounter Details:  CNP Questionnaire - 08/05/22 1133       Questionnaire   Ask client: Do you give verbal consent for me to treat you today? Yes    Student Assistance N/A    Location Patient Served  [facility]    Visit Setting with Client Organization    Patient Status Unknown    Insurance Medicaid    Insurance/Financial Assistance Referral N/A    Medication N/A    Medical Provider Yes    Screening Referrals Made N/A    Medical Referrals Made N/A    Medical Appointment Made N/A    Recently w/o PCP, now 1st time PCP visit completed due to CNs referral or appointment made N/A    Food N/A    Transportation N/A    Housing/Utilities N/A    Interpersonal Safety N/A    Interventions Advocate/Support;Educate    Abnormal to Normal Screening Since Last CN Visit N/A    Screenings CN Performed N/A    Sent Client to Lab for: N/A    Did client attend any of the following based off CNs referral or appointments made? N/A    ED Visit Averted N/A    Life-Saving Intervention Made N/A            Client came to [facility] to get lunch and have nurse watch her give herself Trulicity insulin. Client reports she checked her cbg this morning and it was 260. Encouraged and supported client as she gave herself insulin.  Linetta Regner W RN CN

## 2022-08-13 NOTE — Congregational Nurse Program (Signed)
  Dept: [phone]   Congregational Nurse Program Note  Date of Encounter:  08/12/2022  Clinic visit to assist with injection of Trulicity.  Injection given with assistance in lower left quadrant of abdomen.  Discussed meals to keep blood sugar within range to prevent lows, blood sugar 70 this AM prior to breakfast.  Encouraged her to cook meals rather than doing take out meals.  Past Medical History: Past Medical History:  Diagnosis Date   Diabetes mellitus without complication (HCC)    DiGeorge syndrome (HCC)    Ectrodactyly    Genetic defect    genetic mutation of unknown significance per [facility] genetic notes   GERD (gastroesophageal reflux disease)    Hypercholesterolemia    Hypertension    Intellectual disability    Microcephaly (HCC)    Pelvic mass    Ventral hernia     Encounter Details:  CNP Questionnaire - 08/12/22 1710       Questionnaire   Ask client: Do you give verbal consent for me to treat you today? Yes    Student Assistance N/A    Location Patient [place]    Visit Setting with Client Organization    Patient Status Unknown    Insurance Medicaid    Insurance/Financial Assistance Referral N/A    Medication N/A    Medical Provider Yes    Screening Referrals Made N/A    Medical Referrals Made N/A    Medical Appointment Made N/A    Recently w/o PCP, now 1st time PCP visit completed due to CNs referral or appointment made N/A    Food N/A    Transportation N/A    Housing/Utilities N/A    Interpersonal Safety N/A    Interventions Advocate/Support;Educate;Counsel    Abnormal to Normal Screening Since Last CN Visit N/A    Screenings CN Performed N/A    Sent Client to Lab for: N/A    Did client attend any of the following based off CNs referral or appointments made? N/A    ED Visit Averted N/A    Life-Saving Intervention Made N/A

## 2022-08-25 ENCOUNTER — Encounter (HOSPITAL_BASED_OUTPATIENT_CLINIC_OR_DEPARTMENT_OTHER): Payer: Medicaid Other | Attending: General Surgery | Admitting: General Surgery

## 2022-08-25 DIAGNOSIS — F79 Unspecified intellectual disabilities: Secondary | ICD-10-CM | POA: Insufficient documentation

## 2022-08-25 DIAGNOSIS — L905 Scar conditions and fibrosis of skin: Secondary | ICD-10-CM | POA: Insufficient documentation

## 2022-08-25 DIAGNOSIS — E119 Type 2 diabetes mellitus without complications: Secondary | ICD-10-CM | POA: Insufficient documentation

## 2022-08-25 DIAGNOSIS — E43 Unspecified severe protein-calorie malnutrition: Secondary | ICD-10-CM | POA: Diagnosis not present

## 2022-08-26 NOTE — Congregational Nurse Program (Signed)
  Dept: [phone]   Congregational Nurse Program Note  Date of Encounter: 08/26/2022  Clinic visit to assist with injection of Trulicity, states she had procedure last week and missed getting weekly injection.  Came to [employer] for lunch, had not eaten but had take out meal with her from the dining room.  States blood glucose was 57, encouraged to eat lunch. Educated regarding need to eat meal regularly to prevent low blood glucose levels.  BP 92/62, pulse 96 and regular, O2 99%.  Assisted with injecting Trulicity in left low quadrant of abdomen. Past Medical History: Past Medical History:  Diagnosis Date   Diabetes mellitus without complication (HCC)    DiGeorge syndrome (HCC)    Ectrodactyly    Genetic defect    genetic mutation of unknown significance per [facility] genetic notes   GERD (gastroesophageal reflux disease)    Hypercholesterolemia    Hypertension    Intellectual disability    Microcephaly (HCC)    Pelvic mass    Ventral hernia     Encounter Details:  CNP Questionnaire - 08/26/22 1215       Questionnaire   Ask client: Do you give verbal consent for me to treat you today? Yes    Student Assistance N/A    Location Patient Served  [facility]    Visit Setting with Client Organization    Patient Status Unknown   Has own apartment at [facility]    Insurance/Financial Assistance Referral N/A    Medication N/A    Medical Provider Yes    Screening Referrals Made N/A    Medical Referrals Made N/A    Medical Appointment Made N/A    Recently w/o PCP, now 1st time PCP visit completed due to CNs referral or appointment made N/A    Food N/A    Transportation N/A    Housing/Utilities N/A    Interpersonal Safety N/A    Interventions Advocate/Support;Educate;Counsel;Case Management    Abnormal to Normal Screening Since Last CN Visit N/A    Screenings CN Performed Pulse Ox;Blood Pressure    Sent Client to Lab for: N/A    Did client  attend any of the following based off CNs referral or appointments made? N/A    ED Visit Averted N/A    Life-Saving Intervention Made N/A

## 2022-08-26 NOTE — Progress Notes (Signed)
[name] ([id number]) [id number] Page 1 of 4 Visit Report for 08/25/2022 Abuse Risk Screen Details Patient Name: Date of Service: [name] 08/25/2022 9:00 A M Medical Record Number: [id number] Patient Account Number: [id number] Date of Birth/Sex: Treating RN: 28-Aug-1975 (47 y.o. Female) [name] Primary Care [name]: [name], [place] TRICK Other Clinician: Referring [name]: Treating [name]/Extender: Tina Curb, PA TRICK Weeks in Treatment: 0 Abuse Risk Screen Items Answer ABUSE RISK SCREEN: Has anyone close to you tried to hurt or harm you recentlyo No Do you feel uncomfortable with anyone in your familyo No Has anyone forced you do things that you didnt want to doo No Electronic Signature(s) Signed: 08/25/2022 5:11:22 PM By: Tina Deed RN, BSN Entered By: [name] on 08/25/2022 09:25:51 -------------------------------------------------------------------------------- Activities of Daily Living Details Patient Name: Date of Service: [name] 08/25/2022 9:00 A M Medical Record Number: [id number] Patient Account Number: [id number] Date of Birth/Sex: Treating RN: 11/11/1975 (47 y.o. Female) [name] Primary Care [name]: [name], [place] TRICK Other Clinician: Referring [name]: Treating [name]/Extender: Tina Curb, PA TRICK Weeks in Treatment: 0 Activities of Daily Living Items Answer Activities of Daily Living (Please select one for each item) Drive Automobile Not Able T Medications ake Completely Able Use T elephone Completely Able Care for Appearance Completely Able Use T oilet Completely Able Bath / Shower Completely Able Dress Self Completely Able Feed Self Completely Able Walk Completely Able Get In / Out Bed Completely Able Housework Completely Able Prepare Meals Completely Able Handle Money Completely Able Shop for Self  Completely Able Electronic Signature(s) Signed: 08/25/2022 5:11:22 PM By: Tina Deed RN, BSN Entered By: [name] on 08/25/2022 09:26:35 -------------------------------------------------------------------------------- Education Screening Details Patient Name: Date of Service: [name] 08/25/2022 9:00 A M Medical Record Number: [id number] Patient Account Number: [id number] Date of Birth/Sex: Treating RN: 1975-07-03 (47 y.o. Female) [name] Primary Care [name]: [name], [place] TRICK Other Clinician: Referring [name]: Treating [name]/Extender: Tina Curb, PA TRICK Weeks in TreatmentFRANCEE, [name] ([id number]) [id number] Page 2 of 4 Primary Learner Assessed: Patient Learning Preferences/Education Level/Primary Language Learning Preference: Explanation Highest Education Level: High School Preferred Language: English Cognitive Barrier Language Barrier: No Translator Needed: No Memory Deficit: No Emotional Barrier: No Cultural/Religious Beliefs Affecting Medical Care: No Physical Barrier Impaired Vision: Yes Glasses Impaired Hearing: No Decreased Hand dexterity: Yes Knowledge/Comprehension Knowledge Level: Medium Comprehension Level: Medium Ability to understand written instructions: Medium Ability to understand verbal instructions: Medium Motivation Anxiety Level: Calm Cooperation: Cooperative Education Importance: Acknowledges Need Interest in Health Problems: Asks Questions Perception: Coherent Willingness to Engage in Self-Management High Activities: Readiness to Engage in Self-Management High Activities: Electronic Signature(s) Signed: 08/25/2022 5:11:22 PM By: Tina Deed RN, BSN Entered By: [name] on 08/25/2022 09:27:08 -------------------------------------------------------------------------------- Fall Risk Assessment Details Patient Name: Date of  Service: [name] 08/25/2022 9:00 A M Medical Record Number: [id number] Patient Account Number: [id number] Date of Birth/Sex: Treating RN: 20-Sep-1975 (47 y.o. Female) [name] Primary Care [name]: [name], [place] TRICK Other Clinician: Referring [name]: Treating [name]/Extender: Tina Curb, PA TRICK Weeks in Treatment: 0 Fall Risk Assessment Items Have you had 2 or more falls in the last 12 monthso 0 No Have you had any fall that resulted in injury in the last 12 monthso 0 No FALLS RISK SCREEN History of falling - immediate or within 3  months 25 Yes Secondary diagnosis (Do you have 2 or more medical diagnoseso) 0 No Ambulatory aid None/bed rest/wheelchair/nurse 0 Yes Crutches/cane/walker 0 No Furniture 0 No Intravenous therapy Access/Saline/Heparin Lock 0 No Gait/Transferring Normal/ bed rest/ wheelchair 0 Yes Weak (short steps with or without shuffle, stooped but able to lift head while walking, may seek 0 No support from furniture) Impaired (short steps with shuffle, may have difficulty arising from chair, head down, impaired 0 No balance) Mental Status Oriented to own ability 0 Yes Overestimates or forgets limitations 0 No Risk Level: Medium Risk Score: 25 [name], [name] ([id number]) [phone] [id number] Page 3 of 4 Electronic Signature(s) -------------------------------------------------------------------------------- Foot Assessment Details Patient Name: Date of Service: [name] 08/25/2022 9:00 A M Medical Record Number: [id number] Patient Account Number: [id number] Date of Birth/Sex: Treating RN: 1975-07-22 (47 y.o. Female) [name] Primary Care [name]: [name], [place] TRICK Other Clinician: Referring [name]: Treating [name]/Extender: Tina Curb, PA TRICK Weeks in Treatment: 0 Foot Assessment Items Site Locations + = Sensation present, - = Sensation  absent, C = Callus, U = Ulcer R = Redness, W = Warmth, M = Maceration, PU = Pre-ulcerative lesion F = Fissure, S = Swelling, D = Dryness Assessment Right: Left: Other Deformity: No No Prior Foot Ulcer: No No Prior Amputation: No No Charcot Joint: No No Ambulatory Status: Ambulatory Without Help Gait: Steady Electronic Signature(s) Signed: 08/25/2022 5:11:22 PM By: Tina Deed RN, BSN Entered By: [name] on 08/25/2022 09:28:08 -------------------------------------------------------------------------------- Nutrition Risk Screening Details Patient Name: Date of Service: [name] 08/25/2022 9:00 A M Medical Record Number: [id number] Patient Account Number: [id number] Date of Birth/Sex: Treating RN: 10-09-1975 (47 y.o. Female) [name] Primary Care Chang Tiggs: [name], [place] TRICK Other Clinician: Referring Mikaela Hilgeman: Treating Doratha Mcswain/Extender: Tina Curb, PA TRICK Weeks in Treatment: 0 Height (in): Weight (lbs): Body Mass Index (BMI): [name], [name] ([id number]) [id number] Page 4 of 4 Nutrition Risk Screening Items Score Screening NUTRITION RISK SCREEN: I have an illness or condition that made me change the kind and/or amount of food I eat 0 No I eat fewer than two meals per day 0 No I eat few fruits and vegetables, or milk products 0 No I have three or more drinks of beer, liquor or wine almost every day 0 No I have tooth or mouth problems that make it hard for me to eat 2 Yes I don't always have enough money to buy the food I need 0 No I eat alone most of the time 1 Yes I take three or more different prescribed or over-the-counter drugs a day 0 No Without wanting to, I have lost or gained 10 pounds in the last six months 0 No I am not always physically able to shop, cook and/or feed myself 0 No Nutrition Protocols Good Risk Protocol Provide education on elevated blood Moderate Risk  Protocol 0 sugars and impact on wound healing, as applicable High Risk Proctocol Risk Level: Moderate Risk Score: 3 Electronic Signature(s) Signed: 08/25/2022 5:11:22 PM By: Tina Deed RN, BSN Entered By: [name] on 08/25/2022 09:27:59

## 2022-08-26 NOTE — Congregational Nurse Program (Signed)
  Dept: [phone]   Congregational Nurse Program Note  Date of Encounter: 07/22/2022    Past Medical History: Past Medical History:  Diagnosis Date   Diabetes mellitus without complication (HCC)    DiGeorge syndrome (HCC)    Ectrodactyly    Genetic defect    genetic mutation of unknown significance per [facility] genetic notes   GERD (gastroesophageal reflux disease)    Hypercholesterolemia    Hypertension    Intellectual disability    Microcephaly (HCC)    Pelvic mass    Ventral hernia     Encounter Details:

## 2022-09-02 NOTE — Congregational Nurse Program (Signed)
  Dept: [phone]   Congregational Nurse Program Note  Date of Encounter: 09/02/2022  Spoke with resident via telephone to determine if she needed assistance with weekly Trulicity injection.  States that she didn't pick up refill of the medication and will get it today.  Educated regarding need to take the medication weekly as prescribed and given the hours of the nurse clinic at [facility] for Wednesday and Thursday to go there to prevent missing a weekly dose. Past Medical History: Past Medical History:  Diagnosis Date   Diabetes mellitus without complication (HCC)    DiGeorge syndrome (HCC)    Ectrodactyly    Genetic defect    genetic mutation of unknown significance per [facility] genetic notes   GERD (gastroesophageal reflux disease)    Hypercholesterolemia    Hypertension    Intellectual disability    Microcephaly (HCC)    Pelvic mass    Ventral hernia     Encounter Details:  CNP Questionnaire - 09/02/22 1330       Questionnaire   Ask client: Do you give verbal consent for me to treat you today? Yes    Student Assistance N/A    Location Patient [facility]    Visit Setting with Client Phone/Text/Email    Patient Status Unknown   Has own apartment at [facility]    Insurance/Financial Assistance Referral N/A    Medication N/A    Medical Provider Yes    Screening Referrals Made N/A    Medical Referrals Made N/A    Medical Appointment Made N/A    Recently w/o PCP, now 1st time PCP visit completed due to CNs referral or appointment made N/A    Food N/A    Transportation Need transportation assistance    Housing/Utilities N/A    Interpersonal Safety N/A    Interventions Advocate/Support;Counsel    Abnormal to Normal Screening Since Last CN Visit N/A    Screenings CN Performed N/A    Sent Client to Lab for: N/A    Did client attend any of the following based off CNs referral or appointments made? N/A    ED Visit Averted  N/A    Life-Saving Intervention Made N/A

## 2022-09-09 DIAGNOSIS — E1110 Type 2 diabetes mellitus with ketoacidosis without coma: Secondary | ICD-10-CM

## 2022-09-09 LAB — GLUCOSE, POCT (MANUAL RESULT ENTRY): POC Glucose: 150 mg/dl — AB (ref 70–99)

## 2022-09-09 NOTE — Congregational Nurse Program (Signed)
  Dept: [phone]   Congregational Nurse Program Note  Date of Encounter: 09/09/2022  Clinic visit to check blood glucose and assist with Trulicity injection.  States at about 9A blood glucose was 200, she took 50 units of insulin and drank water.  Missed dosage of Trulicity last week.  Assisted with injecting Trulicity in left lower quadrant of abdomen. Past Medical History: Past Medical History:  Diagnosis Date   Diabetes mellitus without complication (HCC)    DiGeorge syndrome (HCC)    Ectrodactyly    Genetic defect    genetic mutation of unknown significance per [facility] genetic notes   GERD (gastroesophageal reflux disease)    Hypercholesterolemia    Hypertension    Intellectual disability    Microcephaly (HCC)    Pelvic mass    Ventral hernia     Encounter Details:  CNP Questionnaire - 09/09/22 1040       Questionnaire   Ask client: Do you give verbal consent for me to treat you today? Yes    Student Assistance N/A    Location Patient Served  [facility]    Visit Setting with Client Organization    Patient Status Unknown   Has own apartment at [facility]    Insurance/Financial Assistance Referral N/A    Medication N/A    Medical Provider Yes    Screening Referrals Made N/A    Medical Referrals Made N/A    Medical Appointment Made N/A    Recently w/o PCP, now 1st time PCP visit completed due to CNs referral or appointment made N/A    Food Have Food Insecurities    Transportation Need transportation assistance    Housing/Utilities N/A    Interpersonal Safety N/A    Interventions Advocate/Support;Counsel;Educate;Reviewed Medications    Abnormal to Normal Screening Since Last CN Visit N/A    Screenings CN Performed Blood Glucose;Pulse Ox    Sent Client to Lab for: N/A    Did client attend any of the following based off CNs referral or appointments made? N/A    ED Visit Averted N/A    Life-Saving Intervention Made N/A

## 2022-09-16 NOTE — Congregational Nurse Program (Signed)
  Dept: [phone]   Congregational Nurse Program Note  Date of Encounter: 09/16/2022  Clinic visit to assist with Trulicity injection.  Reviewed blood glucose results for past week per her report.  Blood glucose 67 this Am but did not eat breakfast, came for early lunch at [facility] and had eaten just prior to clinic visit.  Assisted with injection in right lower quadrant of abdomen.  Educated on normal blood glucose range and necessity of eating or having juice when blood glucose is below 70 in AM before leaving home. Past Medical History: Past Medical History:  Diagnosis Date   Diabetes mellitus without complication (HCC)    DiGeorge syndrome (HCC)    Ectrodactyly    Genetic defect    genetic mutation of unknown significance per [facility] genetic notes   GERD (gastroesophageal reflux disease)    Hypercholesterolemia    Hypertension    Intellectual disability    Microcephaly (HCC)    Pelvic mass    Ventral hernia     Encounter Details:  CNP Questionnaire - 09/16/22 1200       Questionnaire   Ask client: Do you give verbal consent for me to treat you today? Yes    Student Assistance N/A    Location Patient Served  [facility]    Visit Setting with Client Organization    Patient Status Unknown   Has own apartment at [facility]    Insurance/Financial Assistance Referral N/A    Medication N/A    Medical Provider Yes    Screening Referrals Made N/A    Medical Referrals Made N/A    Medical Appointment Made N/A    Recently w/o PCP, now 1st time PCP visit completed due to CNs referral or appointment made N/A    Food Have Food Insecurities    Transportation Need transportation assistance    Housing/Utilities N/A    Interpersonal Safety N/A    Interventions Advocate/Support;Counsel;Educate;Reviewed Medications    Abnormal to Normal Screening Since Last CN Visit N/A    Screenings CN Performed Pulse Ox;Weight    Sent Client to Lab for: N/A    Did  client attend any of the following based off CNs referral or appointments made? N/A    ED Visit Averted N/A    Life-Saving Intervention Made N/A

## 2022-09-23 DIAGNOSIS — E1110 Type 2 diabetes mellitus with ketoacidosis without coma: Secondary | ICD-10-CM

## 2022-09-23 LAB — GLUCOSE, POCT (MANUAL RESULT ENTRY): POC Glucose: 31 mg/dl — AB (ref 70–99)

## 2022-09-30 DIAGNOSIS — E1110 Type 2 diabetes mellitus with ketoacidosis without coma: Secondary | ICD-10-CM

## 2022-09-30 LAB — GLUCOSE, POCT (MANUAL RESULT ENTRY): POC Glucose: 127 mg/dl — AB (ref 70–99)

## 2022-09-30 NOTE — Congregational Nurse Program (Signed)
  Dept: [phone]   Congregational Nurse Program Note  Date of Encounter: 09/30/2022  Clinic visit to assist with Trulicity injection.  Blood glucose 127 prior to giving injection in lower right quadrant of abdomen.  Reviewed medications, AM blood glucose levels per her report AM in 60's and 70's most of the time.  Had hamberger lunch today at [employer] then a snack of popcorn.  Has not cooked any meals at home recently, discussed benefits of preparing meals and necessity of eating regular meals to maintain blood glucose at consistent levels. Past Medical History: Past Medical History:  Diagnosis Date   Diabetes mellitus without complication (HCC)    DiGeorge syndrome (HCC)    Ectrodactyly    Genetic defect    genetic mutation of unknown significance per [facility] genetic notes   GERD (gastroesophageal reflux disease)    Hypercholesterolemia    Hypertension    Intellectual disability    Microcephaly (HCC)    Pelvic mass    Ventral hernia     Encounter Details:  CNP Questionnaire - 09/30/22 1740       Questionnaire   Ask client: Do you give verbal consent for me to treat you today? Yes    Student Assistance N/A    Location Patient [place]    Visit Setting with Client Organization    Patient Status Unknown   Has own apartment at [facility]    Insurance/Financial Assistance Referral N/A    Medication N/A    Medical Provider Yes    Screening Referrals Made N/A    Medical Referrals Made N/A    Medical Appointment Made N/A    Recently w/o PCP, now 1st time PCP visit completed due to CNs referral or appointment made N/A    Food Have Food Insecurities    Transportation Need transportation assistance    Housing/Utilities N/A    Interpersonal Safety N/A    Interventions Advocate/Support;Counsel;Educate;Reviewed Medications    Abnormal to Normal Screening Since Last CN Visit N/A    Screenings CN Performed Pulse Ox;Blood Glucose     Sent Client to Lab for: N/A    Did client attend any of the following based off CNs referral or appointments made? N/A    ED Visit Averted N/A    Life-Saving Intervention Made N/A

## 2022-10-01 NOTE — Congregational Nurse Program (Signed)
  Dept: [phone]   Congregational Nurse Program Note  Date of Encounter: 09/23/2022  Came to clinic for assistance with Trulicity injection and complained of feeling a little dizzy, BP 118/76, pulse 90 and regular.  Blood glucose 31, given 6 oz orange juice and a granola bar.  Recheck blood glucose was 129 about 30 minutes after eating the snack. Resident had ordered dinner to eat after clinic visit, counseled regarding eating regular meals to prevent low blood glucose levels.    Assisted with Trulicity injection in lower right quadrant of abdomen.  Past Medical History: Past Medical History:  Diagnosis Date   Diabetes mellitus without complication (HCC)    DiGeorge syndrome (HCC)    Ectrodactyly    Genetic defect    genetic mutation of unknown significance per [facility] genetic notes   GERD (gastroesophageal reflux disease)    Hypercholesterolemia    Hypertension    Intellectual disability    Microcephaly (HCC)    Pelvic mass    Ventral hernia     Encounter Details:  CNP Questionnaire - 09/23/22 1700       Questionnaire   Ask client: Do you give verbal consent for me to treat you today? Yes    Student Assistance N/A    Location Patient [place]    Visit Setting with Client Organization    Patient Status Unknown   Has own apartment at [facility]    Insurance/Financial Assistance Referral N/A    Medication N/A    Medical Provider Yes    Screening Referrals Made N/A    Medical Referrals Made [facility] PCP/Clinic    Medical Appointment Made N/A    Recently w/o PCP, now 1st time PCP visit completed due to CNs referral or appointment made N/A    Food Have Food Insecurities    Transportation Need transportation assistance    Housing/Utilities N/A    Interpersonal Safety N/A    Interventions Advocate/Support;Counsel;Educate;Reviewed Medications;Case Management    Abnormal to Normal Screening Since Last CN Visit N/A    Screenings CN  Performed Pulse Ox;Blood Glucose    Sent Client to Lab for: N/A    Did client attend any of the following based off CNs referral or appointments made? N/A    ED Visit Averted N/A    Life-Saving Intervention Made N/A

## 2022-10-06 NOTE — Progress Notes (Signed)
JAZLIN, LOUCH ([id number]) [id number] Page 1 of 8 Visit Report for 08/25/2022 Chief Complaint Document Details Patient Name: Date of Service: [name] 08/25/2022 9:00 A M Medical Record Number: [id number] Patient Account Number: [id number] Date of Birth/Sex: Treating RN: 18-May-1975 (47 y.o. Female) Primary Care Provider: Sedalia Muta, [place] TRICK Other Clinician: Referring Provider: Treating Provider/Extender: Lowella Curb, PA TRICK Weeks in Treatment: 0 Information Obtained from: Patient Chief Complaint Patient seen for complaints of Non-Healing Wound. 08/25/2022: non-wound concern Electronic Signature(s) Signed: 08/25/2022 10:02:00 AM By: Duanne Guess MD FACS Entered By: Duanne Guess on 08/25/2022 10:02:00 -------------------------------------------------------------------------------- HPI Details Patient Name: Date of Service: [name] 08/25/2022 9:00 A M Medical Record Number: [id number] Patient Account Number: [id number] Date of Birth/Sex: Treating RN: 07/31/1975 (47 y.o. Female) Primary Care Provider: Sedalia Muta, [place] TRICK Other Clinician: Referring Provider: Treating Provider/Extender: Lowella Curb, PA TRICK Weeks in Treatment: 0 History of Present Illness HPI Description: ADMISSION 12/11/2021 History is somewhat limited secondary to intellectual delay. Some of the history has been garnered from review of the electronic medical record. The patient reports that she was sitting on pavement with her left foot tucked underneath her. She says that she developed a wound on that site. She saw her PCP. They note from that clinic date (11/13/2021: Says that the wound was open but dried without any drainage. Left foot edema was described. A 7-day course of Keflex was prescribed. She is followed by podiatry for routine diabetic foot care. When she saw them on August 15, the provider referred her to  a different podiatrist who debrided the wound. He applied Iodosorb and prescribed Silvadene daily dressing changes. The patient states that she has not been changing her dressing. She apparently resides in some sort of facility and she says there are nurses and doctors there. Specific details are difficult to sort out. She denies any significant pain to the wound. On her left lateral malleolus, there is a circular wound that does seem consistent with the history provided. There is some slough buildup in the center of the wound. No fluctuance, odor, or purulent drainage. 12/16/2021: The wound is about the same size today but the surface is quite a bit cleaner. 12/23/2021: The wound continues to contract. Has developed some undermining but this appears to be secondary to some perimeter eschar. No concern for infection. 12/30/2021: The area of undermining has broken down and she now has 3 small areas that are open. They all connect under the thin bands of skin. Her drainage has decreased. There is a little bit of slough and eschar accumulation. 01/06/2022: The wound looks a bit shallower today; I removed the skin that was causing the undermining at her last visit with me. There is a little bit of slough overlying good granulation tissue at the more distal aspect of the wound. The more proximal aspect is a bit pale and fibrous. 01/14/2022: Her wound has healed. 01/29/2022: The patient returns to clinic today because she was concerned that her wound had reopened. On intake, however, it was noted that she had a thick buildup of Iodosorb on her skin; apparently she had been applying it despite her wound being healed. There is no open wound today. Tina, Church ([id number]) [id number] Page 2 of 8 CONSULTATION ONLY 08/25/2022: The patient came in today because she was concerned about her ankle, where she had her previous wound. She has some scar in the area, but no open  lesion. Electronic Signature(s) Signed: 08/25/2022 10:02:41 AM By: Duanne Guess MD FACS Entered By: Duanne Guess on 08/25/2022 10:02:41 -------------------------------------------------------------------------------- Physical Exam Details Patient Name: Date of Service: [name] 08/25/2022 9:00 A M Medical Record Number: [id number] Patient Account Number: [id number] Date of Birth/Sex: Treating RN: 11-Jan-1976 (47 y.o. Female) Primary Care Provider: Sedalia Muta, [place] TRICK Other Clinician: Referring Provider: Treating Provider/Extender: Lowella Curb, PA TRICK Weeks in Treatment: 0 Constitutional . . . . No acute distress. Respiratory Normal work of breathing on room air. Notes 08/25/2022: On her ankle where she had previously had a wound, there is some scarring and darkened skin, but there is no actual opening or lesion. Electronic Signature(s) Signed: 08/25/2022 10:03:29 AM By: Duanne Guess MD FACS Entered By: Duanne Guess on 08/25/2022 10:03:28 -------------------------------------------------------------------------------- Physician Orders Details Patient Name: Date of Service: Christean Leaf Lindon Romp 08/25/2022 9:00 A M Medical Record Number: [id number] Patient Account Number: [id number] Date of Birth/Sex: Treating RN: 09/14/1975 (47 y.o. Female) Zenaida Deed Primary Care Provider: Sedalia Muta, [place] TRICK Other Clinician: Referring Provider: Treating Provider/Extender: Lowella Curb, PA TRICK Weeks in Treatment: 0 Verbal / Phone Orders: No Diagnosis Coding ICD-10 Coding Code Description E11.9 Type 2 diabetes mellitus without complications E43 Unspecified severe protein-calorie malnutrition F79 Unspecified intellectual disabilities Discharge From [facility] Services Discharge from [facility] Electronic Signature(s) Signed: 08/25/2022 10:03:39 AM By: Duanne Guess MD FACS Entered By: Duanne Guess on 08/25/2022  10:03:39 [name] ([id number]) [id number] Page 3 of 8 -------------------------------------------------------------------------------- Problem List Details Patient Name: Date of Service: [name] 08/25/2022 9:00 A M Medical Record Number: [id number] Patient Account Number: [id number] Date of Birth/Sex: Treating RN: 1975-09-08 (47 y.o. Female) Primary Care Provider: Sedalia Muta, [place] TRICK Other Clinician: Referring Provider: Treating Provider/Extender: Lowella Curb, PA TRICK Weeks in Treatment: 0 Active Problems ICD-10 Encounter Code Description Active Date MDM Diagnosis E11.9 Type 2 diabetes mellitus without complications 08/25/2022 No Yes E43 Unspecified severe protein-calorie malnutrition 08/25/2022 No Yes F79 Unspecified intellectual disabilities 08/25/2022 No Yes Inactive Problems Resolved Problems Electronic Signature(s) Signed: 08/25/2022 10:01:40 AM By: Duanne Guess MD FACS Entered By: Duanne Guess on 08/25/2022 10:01:40 -------------------------------------------------------------------------------- Progress Note Details Patient Name: Date of Service: [name] 08/25/2022 9:00 A M Medical Record Number: [id number] Patient Account Number: [id number] Date of Birth/Sex: Treating RN: 05/27/1975 (47 y.o. Female) Primary Care Provider: Sedalia Muta, [place] TRICK Other Clinician: Referring Provider: Treating Provider/Extender: Lowella Curb, PA TRICK Weeks in Treatment: 0 Subjective Chief Complaint Information obtained from Patient Patient seen for complaints of Non-Healing Wound. 08/25/2022: non-wound concern History of Present Illness (HPI) ADMISSION 12/11/2021 History is somewhat limited secondary to intellectual delay. Some of the history has been garnered from review of the electronic medical record. The patient reports that she was sitting on pavement with her left foot tucked  underneath her. She says that she developed a wound on that site. She saw her PCP. They note from that clinic date (11/13/2021: Says that the wound was open but dried without any drainage. Left foot edema was described. A 7-day course of Keflex was prescribed. She is followed by podiatry for routine diabetic foot care. When she saw them on August 15, the provider referred her to a different podiatrist who debrided the wound. He applied Iodosorb and prescribed Silvadene daily dressing changes. The patient states that she has not been changing her dressing. She apparently  resides in some sort of facility and she says there are nurses and doctors there. Specific details are difficult to sort out. She denies any significant pain to the wound. ROSARY, MANWELL ([id number]) [id number] Page 4 of 8 On her left lateral malleolus, there is a circular wound that does seem consistent with the history provided. There is some slough buildup in the center of the wound. No fluctuance, odor, or purulent drainage. 12/16/2021: The wound is about the same size today but the surface is quite a bit cleaner. 12/23/2021: The wound continues to contract. Has developed some undermining but this appears to be secondary to some perimeter eschar. No concern for infection. 12/30/2021: The area of undermining has broken down and she now has 3 small areas that are open. They all connect under the thin bands of skin. Her drainage has decreased. There is a little bit of slough and eschar accumulation. 01/06/2022: The wound looks a bit shallower today; I removed the skin that was causing the undermining at her last visit with me. There is a little bit of slough overlying good granulation tissue at the more distal aspect of the wound. The more proximal aspect is a bit pale and fibrous. 01/14/2022: Her wound has healed. 01/29/2022: The patient returns to clinic today because she was concerned that her wound had  reopened. On intake, however, it was noted that she had a thick buildup of Iodosorb on her skin; apparently she had been applying it despite her wound being healed. There is no open wound today. CONSULTATION ONLY 08/25/2022: The patient came in today because she was concerned about her ankle, where she had her previous wound. She has some scar in the area, but no open lesion. Patient History Unable to Obtain Patient History due to Altered Mental Status. Information obtained from Patient. Allergies No Known Allergies Family History Diabetes - Mother,Father, Stroke - Siblings. Social History Never smoker, Marital Status - Single, Alcohol Use - Never, Drug Use - No History, Caffeine Use - Daily. Medical History Hematologic/Lymphatic Patient has history of Anemia Cardiovascular Patient has history of Hypotension Denies history of Hypertension Endocrine Patient has history of Type II Diabetes Denies history of Type I Diabetes Genitourinary Denies history of End Stage Renal Disease Integumentary (Skin) Denies history of History of Burn Oncologic Denies history of Received Chemotherapy, Received Radiation Psychiatric Denies history of Anorexia/bulimia, Confinement Anxiety Patient is treated with Insulin, Oral Agents. Blood sugar is tested. Hospitalization/Surgery History - Cholecystectomy. - pelvic mass. - ventral hernia/ hernia repair. - Hand surgery (right hand). 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 Eyes Complains or has symptoms of Glasses / Contacts. Ear/Nose/Mouth/Throat Denies complaints or symptoms of Chronic sinus problems or rhinitis. Respiratory Denies complaints or symptoms of Chronic or frequent  coughs, Shortness of Breath. Cardiovascular Denies complaints or symptoms of Chest pain. Gastrointestinal Denies complaints or symptoms of Frequent diarrhea, Nausea, Vomiting. Endocrine Denies complaints or symptoms of Heat/cold intolerance. Genitourinary Denies complaints or symptoms of Frequent urination. Integumentary (Skin) Denies complaints or symptoms of Wounds. Neurologic KEAYRA, CLOUTHIER ([id number]) [id number] Page 5 of 8 Denies complaints or symptoms of Numbness/parasthesias. Psychiatric Denies complaints or symptoms of Claustrophobia. Objective Constitutional No acute distress. Vitals Time Taken: 9:18 AM, Temperature: 98.3 F, Pulse: 88 bpm, Respiratory Rate: 18 breaths/min, Blood Pressure: 112/72 mmHg. Respiratory Normal work of  breathing on room air. General Notes: 08/25/2022: On her ankle where she had previously had a wound, there is some scarring and darkened skin, but there is no actual opening or lesion. Assessment Active Problems ICD-10 Type 2 diabetes mellitus without complications Unspecified severe protein-calorie malnutrition Unspecified intellectual disabilities Plan Discharge From [facility] Services: Discharge from Wound Care Center 08/25/2022: On her ankle where she had previously had a wound, there is some scarring and darkened skin, but there is no actual opening or lesion. I explained to the patient that she actually did not have an open wound and she seemed quite relieved to hear this. She said that she had been concerned and just wanted to have it checked. She does not have any wound care center needs at this time. She may follow-up as needed. Electronic Signature(s) Signed: 10/06/2022 2:13:34 PM By: Pearletha Alfred Signed: 10/06/2022 2:55:18 PM By: Duanne Guess MD FACS Previous Signature: 08/25/2022 10:04:23 AM Version By: Duanne Guess MD FACS Entered By: Pearletha Alfred on 10/06/2022  14:13:34 -------------------------------------------------------------------------------- HxROS Details Patient Name: Date of Service: [name] 08/25/2022 9:00 A M Medical Record Number: [id number] Patient Account Number: [id number] Date of Birth/Sex: Treating RN: [birth year] (47 y.o. Female) Zenaida Deed Primary Care Provider: Sedalia Muta, [place] TRICK Other Clinician: Referring Provider: Treating Provider/Extender: Lowella Curb, PA TRICK Weeks in TreatmentHARRY, Church ([id number]) [id number] Page 6 of 8 Unable to Obtain Patient History due to Altered Mental Status Information Obtained From Patient Constitutional Symptoms (General Health) Complaints and Symptoms: Negative for: Fatigue; Fever; Chills; Marked Weight Change Eyes Complaints and Symptoms: Positive for: Glasses / Contacts Ear/Nose/Mouth/Throat Complaints and Symptoms: Negative for: Chronic sinus problems or rhinitis Respiratory Complaints and Symptoms: Negative for: Chronic or frequent coughs; Shortness of Breath Cardiovascular Complaints and Symptoms: Negative for: Chest pain Medical History: Positive for: Hypotension Negative for: Hypertension Gastrointestinal Complaints and Symptoms: Negative for: Frequent diarrhea; Nausea; Vomiting Medical History: Past Medical History Notes: GERD Endocrine Complaints and Symptoms: Negative for: Heat/cold intolerance Medical History: Positive for: Type II Diabetes Negative for: Type I Diabetes Past Medical History Notes: DKA ovarian mass Time with diabetes: since 2007 Treated with: Insulin, Oral agents Blood sugar tested every day: Yes Tested : 2 Genitourinary Complaints and Symptoms: Negative for: Frequent urination Medical History: Negative for: End Stage Renal Disease Integumentary (Skin) Complaints and Symptoms: Negative for: Wounds Medical History: Negative for: History of  Burn Neurologic Complaints and Symptoms: Negative for: Numbness/parasthesias Medical History: Past Medical History NotesAZAHRIA, LEHMANN ([id number]) [id number] Page 7 of 8 intellectual disablity Psychiatric Complaints and Symptoms: Negative for: Claustrophobia Medical History: Negative for: Anorexia/bulimia; Confinement Anxiety Hematologic/Lymphatic Medical History: Positive for: Anemia Immunological Musculoskeletal Medical History: Past Medical History Notes: ectrodactyly of both hands Oncologic Medical History: Negative for: Received Chemotherapy; Received Radiation Immunizations Pneumococcal Vaccine: Received Pneumococcal Vaccination: Yes Received Pneumococcal Vaccination On or After 60th Birthday: No Implantable Devices No devices added Hospitalization / Surgery History Type of Hospitalization/Surgery Cholecystectomy pelvic mass ventral hernia/ hernia repair Hand surgery (right hand) Family and Social History Diabetes: Yes - Mother,Father; Stroke: Yes - Siblings; Never smoker; Marital Status - Single; Alcohol Use: Never; Drug Use: No History; Caffeine Use: Daily; Financial Concerns: No; Food, Clothing or Shelter Needs: No; Transportation Concerns: No Psychologist, prison and probation services) Signed: 08/25/2022 10:05:05 AM By: Duanne Guess MD FACS Signed: 08/25/2022 5:11:22 PM By: Zenaida Deed RN, BSN Entered By: Zenaida Deed on 08/25/2022 09:25:43 -------------------------------------------------------------------------------- SuperBill Details Patient Name: Date of Service: [name]  08/25/2022 Medical Record Number: [id number] Patient Account Number: [id number] Date of Birth/Sex: Treating RN: 1976-03-12 (47 y.o. Female) Zenaida Deed Primary Care Provider: Sedalia Muta, [place] TRICK Other Clinician: Referring Provider: Treating Provider/Extender: Lowella Curb, PA TRICK Weeks in Treatment: 0 Diagnosis  Coding ICD-10 Codes Code Description SIRLEY, ALABI ([id number]) [id number] Page 8 of 8 E11.9 Type 2 diabetes mellitus without complications E43 Unspecified severe protein-calorie malnutrition F79 Unspecified intellectual disabilities Facility Procedures : CPT4 Code: [id number] Description: 99213 - WOUND CARE VISIT-LEV 3 EST PT Modifier: Quantity: 1 Physician Procedures : CPT4 Code Description Modifier [id number] 99213 - WC PHYS LEVEL 3 - EST PT ICD-10 Diagnosis Description E11.9 Type 2 diabetes mellitus without complications E43 Unspecified severe protein-calorie malnutrition F79 Unspecified intellectual disabilities Quantity: 1 Electronic Signature(s) Signed: 08/25/2022 10:04:40 AM By: Duanne Guess MD FACS Entered By: Duanne Guess on 08/25/2022 10:04:40

## 2022-10-08 ENCOUNTER — Ambulatory Visit (INDEPENDENT_AMBULATORY_CARE_PROVIDER_SITE_OTHER): Payer: Medicaid Other | Admitting: Podiatry

## 2022-10-08 ENCOUNTER — Encounter: Payer: Self-pay | Admitting: Podiatry

## 2022-10-08 DIAGNOSIS — M79675 Pain in left toe(s): Secondary | ICD-10-CM | POA: Diagnosis not present

## 2022-10-08 DIAGNOSIS — M79674 Pain in right toe(s): Secondary | ICD-10-CM | POA: Diagnosis not present

## 2022-10-08 DIAGNOSIS — B351 Tinea unguium: Secondary | ICD-10-CM

## 2022-10-08 DIAGNOSIS — E0865 Diabetes mellitus due to underlying condition with hyperglycemia: Secondary | ICD-10-CM

## 2022-10-08 NOTE — Progress Notes (Signed)
  Subjective:  Patient ID: [name], female    DOB: [birth year],  MRN: [id number]  [name] presents to clinic today for preventative diabetic foot care and painful elongated mycotic toenails 1-5 bilaterally which are tender when wearing enclosed shoe gear. Pain is relieved with periodic professional debridement.  Chief Complaint  Patient presents with   Diabetes    DFC BS - 73 A1C - DK  LVPCP    New problem(s): None.   PCP is Chauncy Lean, PA-C.  No Known Allergies  Review of Systems: Negative except as noted in the HPI.  Objective: No changes noted in today's physical examination. There were no vitals filed for this visit. [name] is a pleasant 47 y.o. female WD, WN in NAD. AAO x 3.  Vascular Examination: CFT <3 seconds b/l. DP/PT pulses faintly palpable b/l. Skin temperature gradient warm to warm b/l. No pain with calf compression. No ischemia or gangrene. No cyanosis or clubbing noted b/l.    Neurological Examination: Sensation grossly intact b/l with 10 gram monofilament. Vibratory sensation intact b/l.   Dermatological Examination: Pedal skin warm and supple b/l.   No open wounds. No interdigital macerations.  Toenails 1-5 b/l thick, discolored, elongated with subungual debris and pain on dorsal palpation.    No hyperkeratotic nor porokeratotic lesions present on today's visit.  Musculoskeletal Examination: Muscle strength 5/5 to b/l LE. HAV with bunion deformity noted b/l LE.  Radiographs: None  Assessment/Plan: 1. Pain due to onychomycosis of toenails of both feet   2. Diabetes mellitus due to underlying condition, uncontrolled, with hyperglycemia (HCC)    Patient was evaluated and treated. All patient's and/or POA's questions/concerns addressed on today's visit. Mycotic toenails 1-5 debrided in length and girth without incident. Continue soft, supportive shoe gear daily. Report any pedal injuries to medical professional. Call  office if there are any quesitons/concerns. -Patient/POA to call should there be question/concern in the interim.   Return in about 3 months (around 01/08/2023).  Freddie Breech, DPM

## 2022-10-21 ENCOUNTER — Observation Stay (HOSPITAL_COMMUNITY)
Admission: EM | Admit: 2022-10-21 | Discharge: 2022-10-24 | Disposition: A | Discharge status: Home / Self Care | Payer: Medicaid Other | Attending: Internal Medicine | Admitting: Internal Medicine

## 2022-10-21 ENCOUNTER — Other Ambulatory Visit: Payer: Self-pay

## 2022-10-21 ENCOUNTER — Encounter (HOSPITAL_COMMUNITY): Payer: Self-pay

## 2022-10-21 DIAGNOSIS — F79 Unspecified intellectual disabilities: Secondary | ICD-10-CM

## 2022-10-21 DIAGNOSIS — I10 Essential (primary) hypertension: Secondary | ICD-10-CM | POA: Diagnosis not present

## 2022-10-21 DIAGNOSIS — Z794 Long term (current) use of insulin: Secondary | ICD-10-CM

## 2022-10-21 DIAGNOSIS — Z79899 Other long term (current) drug therapy: Secondary | ICD-10-CM | POA: Diagnosis not present

## 2022-10-21 DIAGNOSIS — Z7984 Long term (current) use of oral hypoglycemic drugs: Secondary | ICD-10-CM | POA: Diagnosis not present

## 2022-10-21 DIAGNOSIS — E162 Hypoglycemia, unspecified: Principal | ICD-10-CM

## 2022-10-21 DIAGNOSIS — Z7982 Long term (current) use of aspirin: Secondary | ICD-10-CM | POA: Insufficient documentation

## 2022-10-21 DIAGNOSIS — E119 Type 2 diabetes mellitus without complications: Secondary | ICD-10-CM

## 2022-10-21 DIAGNOSIS — E11649 Type 2 diabetes mellitus with hypoglycemia without coma: Secondary | ICD-10-CM | POA: Diagnosis not present

## 2022-10-21 DIAGNOSIS — E160 Drug-induced hypoglycemia without coma: Secondary | ICD-10-CM | POA: Diagnosis present

## 2022-10-21 DIAGNOSIS — T383X5A Adverse effect of insulin and oral hypoglycemic [antidiabetic] drugs, initial encounter: Secondary | ICD-10-CM | POA: Diagnosis present

## 2022-10-21 LAB — CBG MONITORING, ED
Glucose-Capillary: 73 mg/dL (ref 70–99)
Glucose-Capillary: 81 mg/dL (ref 70–99)

## 2022-10-21 NOTE — ED Provider Notes (Incomplete)
[place] EMERGENCY DEPARTMENT AT [facility] Provider Note   CSN: [id number] Arrival date & time: 10/21/22  2307     History {Add pertinent medical, surgical, social history, OB history to HPI:1} No chief complaint on file.   [name] is a 47 y.o. female.  The history is provided by the patient and medical records.   47 year old female with history of diabetes, GERD, intellectual disability, hypertension, presenting to the ED with hypoglycemia.  Patient stated this evening she started not feeling well, called EMS.  Upon their arrival CBG was 70.  She was given oral glucose.  Patient took her usual 40 units of Lantus this morning followed by 10 units at mealtime.  Last oral intake around 2 PM.  CBG was only 88 at the time but states she went ahead and took her usual 10 units because "I did not want to get off track".  She denies any nausea or vomiting.  Home Medications Prior to Admission medications   Medication Sig Start Date End Date Taking? Authorizing Provider  Insulin Syringe-Needle U-100 30G X 5/16" 0.5 ML MISC Use to administer insulin 2 (two) times daily. 07/03/21   Storm Frisk, MD  Accu-Chek Softclix Lancets lancets Use 3 (three) times daily as directed 08/08/21   Storm Frisk, MD  Acetaminophen 500 MG capsule Take 500-1,000 mg by mouth every 6 (six) hours as needed for pain.    [provider]  ASPIRIN LOW DOSE 81 MG EC tablet Take 1 tablet (81 mg total) by mouth daily. 07/24/21   Storm Frisk, MD  Blood Glucose Monitoring Suppl (ACCU-CHEK GUIDE) w/Device KIT Use to check blood sugar 3 (three) times daily. 07/03/21   Storm Frisk, MD  EASY COMFORT PEN NEEDLES 31G X 5 MM MISC USE TWICE A DAY WITH INSULIN PEN 12/30/17   [provider]  ferrous sulfate 300 (60 Fe) MG/5ML syrup Take 5 mLs (300 mg total) by mouth daily. 06/28/22   Carmel Sacramento A, PA-C  fluticasone (FLONASE) 50 MCG/ACT nasal spray Place 1 spray into both  nostrils daily. Patient taking differently: Place 1 spray into both nostrils daily as needed for allergies or rhinitis. 08/01/21   Zigmund Daniel., MD  glucose blood (ACCU-CHEK GUIDE) test strip Use 3 (three) times daily as instructed 07/03/21   Storm Frisk, MD  insulin glargine (LANTUS) 100 UNIT/ML injection Inject 0.3 mLs (30 Units total) into the skin in the morning AND 0.2 mLs (20 Units total) every evening. Patient taking differently: Inject 40 units into the skin in the morning and 30 units at bedtime 07/03/21   Storm Frisk, MD  Insulin Syringe-Needle U-100 (INSULIN SYRINGE .5CC/31GX5/16") 31G X 5/16" 0.5 ML MISC Inject 1 Syringe into the skin daily. 09/18/21   Storm Frisk, MD  Lancets Misc. (ACCU-CHEK SOFTCLIX LANCET DEV) KIT Use to check blood sugar twice a day 07/24/21   Storm Frisk, MD  metFORMIN (GLUCOPHAGE) 500 MG tablet Take 1 tablet (500 mg total) by mouth 2 (two) times daily with a meal. 07/24/21   Storm Frisk, MD  omeprazole (PRILOSEC) 20 MG capsule Take 1 capsule (20 mg total) by mouth daily. Patient taking differently: Take 20 mg by mouth daily before breakfast. 06/26/17   Mathews Robinsons B, PA-C  ondansetron (ZOFRAN) 4 MG tablet Take 1 tablet (4 mg total) by mouth every 6 (six) hours. Patient not taking: Reported on 12/17/2021 02/07/17   Donnetta Hutching, MD  pravastatin (PRAVACHOL)  20 MG tablet Take 20 mg by mouth daily.    [provider]  silver sulfADIAZINE (SILVADENE) 1 % cream Apply 1 application topically to wound on both ankles once daily. Patient not taking: Reported on 12/17/2021 07/03/21   Storm Frisk, MD      Allergies    Patient has no known allergies.    Review of Systems   Review of Systems  Physical Exam Updated Vital Signs BP 115/84 (BP Location: Right Arm)   Pulse 76   Temp 98 F (36.7 C) (Oral)   Resp 17   Ht 4\' 9"  (1.448 m)   Wt 46.3 kg   SpO2 100%   BMI 22.07 kg/m  Physical Exam  ED Results / Procedures  / Treatments   Labs (all labs ordered are listed, but only abnormal results are displayed) Labs Reviewed  CBC WITH DIFFERENTIAL/PLATELET  BASIC METABOLIC PANEL  URINALYSIS, ROUTINE W REFLEX MICROSCOPIC  CBG MONITORING, ED    EKG None  Radiology No results found.  Procedures Procedures  {Document cardiac monitor, telemetry assessment procedure when appropriate:1}  Medications Ordered in ED Medications - No data to display  ED Course/ Medical Decision Making/ A&P   {   Click here for ABCD2, HEART and other calculatorsREFRESH Note before signing :1}                          Medical Decision Making Amount and/or Complexity of Data Reviewed Labs: ordered.   ***  {Document critical care time when appropriate:1} {Document review of labs and clinical decision tools ie heart score, Chads2Vasc2 etc:1}  {Document your independent review of radiology images, and any outside records:1} {Document your discussion with family members, caretakers, and with consultants:1} {Document social determinants of health affecting pt's care:1} {Document your decision making why or why not admission, treatments were needed:1} Final Clinical Impression(s) / ED Diagnoses Final diagnoses:  None    Rx / DC Orders ED Discharge Orders     None

## 2022-10-21 NOTE — ED Triage Notes (Signed)
Patient BIB [facility] from home with complaint of hypoglycemia.   Per EMS upon arrival CBG 58, EMS administered 15 mg oral glucose, repeat CBG 83.  Patient reported to EMS that she took 40u insuline this AM & 10u insulin later in the day.   Patient reports eating at 1400 today.  Patient also reports taking the additional 10u insulin with a cbg of 88 using home meter.   Patient A & O x 3 on arrival.

## 2022-10-21 NOTE — ED Provider Notes (Signed)
[place] EMERGENCY DEPARTMENT AT [facility] Provider Note   CSN: [id number] Arrival date & time: 10/21/22  2307     History  Chief Complaint  Patient presents with   Hypoglycemia    [name] is a 47 y.o. female.  The history is provided by the patient and medical records.   47 year old female with history of diabetes, GERD, intellectual disability, hypertension, presenting to the ED with hypoglycemia.  Patient stated this evening she started not feeling well, called EMS.  Upon their arrival CBG was 72.  She was given oral glucose.  Patient took her usual 40 units of Lantus this morning followed by 10 units at mealtime.  Last oral intake around 2 PM.  CBG was only 88 at the time but states she went ahead and took her usual 10 units because "I did not want to get off track".  She denies any nausea or vomiting.  Home Medications Prior to Admission medications   Medication Sig Start Date End Date Taking? Authorizing Provider  Insulin Syringe-Needle U-100 30G X 5/16" 0.5 ML MISC Use to administer insulin 2 (two) times daily. 07/03/21   Storm Frisk, MD  Accu-Chek Softclix Lancets lancets Use 3 (three) times daily as directed 08/08/21   Storm Frisk, MD  Acetaminophen 500 MG capsule Take 500-1,000 mg by mouth every 6 (six) hours as needed for pain.    [provider]  ASPIRIN LOW DOSE 81 MG EC tablet Take 1 tablet (81 mg total) by mouth daily. 07/24/21   Storm Frisk, MD  Blood Glucose Monitoring Suppl (ACCU-CHEK GUIDE) w/Device KIT Use to check blood sugar 3 (three) times daily. 07/03/21   Storm Frisk, MD  EASY COMFORT PEN NEEDLES 31G X 5 MM MISC USE TWICE A DAY WITH INSULIN PEN 12/30/17   [provider]  ferrous sulfate 300 (60 Fe) MG/5ML syrup Take 5 mLs (300 mg total) by mouth daily. 06/28/22   Carmel Sacramento A, PA-C  fluticasone (FLONASE) 50 MCG/ACT nasal spray Place 1 spray into both nostrils daily. Patient taking differently:  Place 1 spray into both nostrils daily as needed for allergies or rhinitis. 08/01/21   Zigmund Daniel., MD  glucose blood (ACCU-CHEK GUIDE) test strip Use 3 (three) times daily as instructed 07/03/21   Storm Frisk, MD  insulin glargine (LANTUS) 100 UNIT/ML injection Inject 0.3 mLs (30 Units total) into the skin in the morning AND 0.2 mLs (20 Units total) every evening. Patient taking differently: Inject 40 units into the skin in the morning and 30 units at bedtime 07/03/21   Storm Frisk, MD  Insulin Syringe-Needle U-100 (INSULIN SYRINGE .5CC/31GX5/16") 31G X 5/16" 0.5 ML MISC Inject 1 Syringe into the skin daily. 09/18/21   Storm Frisk, MD  Lancets Misc. (ACCU-CHEK SOFTCLIX LANCET DEV) KIT Use to check blood sugar twice a day 07/24/21   Storm Frisk, MD  metFORMIN (GLUCOPHAGE) 500 MG tablet Take 1 tablet (500 mg total) by mouth 2 (two) times daily with a meal. 07/24/21   Storm Frisk, MD  omeprazole (PRILOSEC) 20 MG capsule Take 1 capsule (20 mg total) by mouth daily. Patient taking differently: Take 20 mg by mouth daily before breakfast. 06/26/17   Mathews Robinsons B, PA-C  ondansetron (ZOFRAN) 4 MG tablet Take 1 tablet (4 mg total) by mouth every 6 (six) hours. Patient not taking: Reported on 12/17/2021 02/07/17   Donnetta Hutching, MD  pravastatin (PRAVACHOL) 20 MG tablet Take  20 mg by mouth daily.    [provider]  silver sulfADIAZINE (SILVADENE) 1 % cream Apply 1 application topically to wound on both ankles once daily. Patient not taking: Reported on 12/17/2021 07/03/21   Storm Frisk, MD      Allergies    Patient has no known allergies.    Review of Systems   Review of Systems  Endocrine:       Hypoglycemia  All other systems reviewed and are negative.   Physical Exam Updated Vital Signs BP 115/84 (BP Location: Right Arm)   Pulse 76   Temp 98 F (36.7 C) (Oral)   Resp 17   Ht 4\' 9"  (1.448 m)   Wt 46.3 kg   SpO2 100%   BMI 22.07 kg/m    Physical Exam Vitals and nursing note reviewed.  Constitutional:      Appearance: She is well-developed.     Comments: Awake, alert, conversant  HENT:     Head: Normocephalic and atraumatic.  Eyes:     Conjunctiva/sclera: Conjunctivae normal.     Pupils: Pupils are equal, round, and reactive to light.  Cardiovascular:     Rate and Rhythm: Normal rate and regular rhythm.     Heart sounds: Normal heart sounds.  Pulmonary:     Effort: Pulmonary effort is normal.     Breath sounds: Normal breath sounds.  Abdominal:     General: Bowel sounds are normal.     Palpations: Abdomen is soft.  Musculoskeletal:        General: Normal range of motion.     Cervical back: Normal range of motion.     Comments: Ectrodactyly of both hands   Skin:    General: Skin is warm and dry.  Neurological:     Mental Status: She is alert and oriented to person, place, and time.     ED Results / Procedures / Treatments   Labs (all labs ordered are listed, but only abnormal results are displayed) Labs Reviewed  CBC WITH DIFFERENTIAL/PLATELET - Abnormal; Notable for the following components:      Result Value   Hemoglobin 10.5 (*)    HCT 33.5 (*)    RDW 17.9 (*)    All other components within normal limits  BASIC METABOLIC PANEL - Abnormal; Notable for the following components:   Calcium 8.7 (*)    All other components within normal limits  CBG MONITORING, ED - Abnormal; Notable for the following components:   Glucose-Capillary 46 (*)    All other components within normal limits  CBG MONITORING, ED - Abnormal; Notable for the following components:   Glucose-Capillary 54 (*)    All other components within normal limits  URINALYSIS, ROUTINE W REFLEX MICROSCOPIC  HEMOGLOBIN A1C  CBG MONITORING, ED  CBG MONITORING, ED  CBG MONITORING, ED  CBG MONITORING, ED    EKG None  Radiology No results found.  Procedures Procedures    CRITICAL CARE Performed by: Garlon Hatchet   Total  critical care time: 45 minutes  Critical care time was exclusive of separately billable procedures and treating other patients.  Critical care was necessary to treat or prevent imminent or life-threatening deterioration.  Critical care was time spent personally by me on the following activities: development of treatment plan with patient and/or surrogate as well as nursing, discussions with consultants, evaluation of patient's response to treatment, examination of patient, obtaining history from patient or surrogate, ordering and performing treatments and interventions, ordering and  review of laboratory studies, ordering and review of radiographic studies, pulse oximetry and re-evaluation of patient's condition.   Medications Ordered in ED Medications  dextrose 5 % and 0.45 % NaCl infusion ( Intravenous New Bag/Given 10/22/22 0342)  enoxaparin (LOVENOX) injection 40 mg (has no administration in time range)  sodium chloride flush (NS) 0.9 % injection 3 mL (has no administration in time range)  acetaminophen (TYLENOL) tablet 650 mg (has no administration in time range)    Or  acetaminophen (TYLENOL) suppository 650 mg (has no administration in time range)    ED Course/ Medical Decision Making/ A&P                             Medical Decision Making Amount and/or Complexity of Data Reviewed Labs: ordered. ECG/medicine tests: ordered and independent interpretation performed.  Risk Prescription drug management. Decision regarding hospitalization.   47 y.o. F here with hypoglycemia.  States her sugars have been on the lower side today, however took her insulin as indicated as she "did not want to get off track".  Last CBG at home was 88 and gave herself additional 10 units of insulin at that time.  CBG 58 with EMS, did improve after some oral glucose.  She is awake, alert, talking here.  When discussing her medications, does not seem like she fully understands when and how to dose her  medications (ie adjust sliding scale, hold for low sugars, etc).  CBG dropping again, now 46.  Patient does have her insulin with her, denies giving herself any additional medication here.  Told RN that her insulin was for her "cholesterol".  She has already eaten sandwich, had PB, and orange juice w/sugar.  At this point, will need D5 which has been started.  Basic labs otherwise reassuring.  She will need admission for recurrent hypoglycemia.  May benefit from some repeat diabetic teaching.  Discussed with hospitalist, Dr. Antionette Char-- will admit for ongoing care.  Final Clinical Impression(s) / ED Diagnoses Final diagnoses:  Hypoglycemia    Rx / DC Orders ED Discharge Orders     None         Garlon Hatchet, PA-C 10/22/22 0343    Sloan Leiter, DO 10/22/22 0820

## 2022-10-21 NOTE — Congregational Nurse Program (Signed)
  Dept: [phone]   Congregational Nurse Program Note  Date of Encounter: 10/21/2022  Clinic visit to assist with Trulicity injection.  Did not take injection last week, educated regarding being consistent with taking injection weekly as prescribed.  Injection given left lower quadrant of abdomen with maximum assistance. Past Medical History: Past Medical History:  Diagnosis Date   Diabetes mellitus without complication (HCC)    DiGeorge syndrome (HCC)    Ectrodactyly    Genetic defect    genetic mutation of unknown significance per [facility] genetic notes   GERD (gastroesophageal reflux disease)    Hypercholesterolemia    Hypertension    Intellectual disability    Microcephaly (HCC)    Pelvic mass    Ventral hernia     Encounter Details:  CNP Questionnaire - 10/21/22 1648       Questionnaire   Ask client: Do you give verbal consent for me to treat you today? Yes    Student Assistance N/A    [place]    Visit Setting with Client Organization    Patient Status Unknown   Has own apartment at [facility]    Insurance/Financial Assistance Referral N/A    Medication N/A    Medical Provider Yes    Screening Referrals Made N/A    Medical Referrals Made N/A    Medical Appointment Made N/A    Recently w/o PCP, now 1st time PCP visit completed due to CNs referral or appointment made N/A    Food Have Food Insecurities    Transportation Need transportation assistance    Housing/Utilities N/A    Interpersonal Safety N/A    Interventions Advocate/Support;Counsel;Educate    Abnormal to Normal Screening Since Last CN Visit N/A    Screenings CN Performed N/A    Sent Client to Lab for: N/A    Did client attend any of the following based off CNs referral or appointments made? N/A    ED Visit Averted N/A    Life-Saving Intervention Made N/A

## 2022-10-22 ENCOUNTER — Encounter (HOSPITAL_COMMUNITY): Payer: Self-pay | Admitting: Family Medicine

## 2022-10-22 DIAGNOSIS — E160 Drug-induced hypoglycemia without coma: Secondary | ICD-10-CM | POA: Diagnosis present

## 2022-10-22 DIAGNOSIS — Z794 Long term (current) use of insulin: Secondary | ICD-10-CM

## 2022-10-22 DIAGNOSIS — E119 Type 2 diabetes mellitus without complications: Secondary | ICD-10-CM

## 2022-10-22 DIAGNOSIS — T383X5A Adverse effect of insulin and oral hypoglycemic [antidiabetic] drugs, initial encounter: Secondary | ICD-10-CM | POA: Diagnosis not present

## 2022-10-22 LAB — CBG MONITORING, ED
Glucose-Capillary: 132 mg/dL — ABNORMAL HIGH (ref 70–99)
Glucose-Capillary: 46 mg/dL — ABNORMAL LOW (ref 70–99)
Glucose-Capillary: 54 mg/dL — ABNORMAL LOW (ref 70–99)
Glucose-Capillary: 56 mg/dL — ABNORMAL LOW (ref 70–99)
Glucose-Capillary: 57 mg/dL — ABNORMAL LOW (ref 70–99)
Glucose-Capillary: 70 mg/dL (ref 70–99)
Glucose-Capillary: 72 mg/dL (ref 70–99)
Glucose-Capillary: 92 mg/dL (ref 70–99)
Glucose-Capillary: 99 mg/dL (ref 70–99)

## 2022-10-22 LAB — CBC WITH DIFFERENTIAL/PLATELET
Abs Immature Granulocytes: 0 10*3/uL (ref 0.00–0.07)
Basophils Absolute: 0 10*3/uL (ref 0.0–0.1)
Basophils Relative: 0 %
Eosinophils Absolute: 0.2 10*3/uL (ref 0.0–0.5)
Eosinophils Relative: 3 %
HCT: 33.5 % — ABNORMAL LOW (ref 36.0–46.0)
Hemoglobin: 10.5 g/dL — ABNORMAL LOW (ref 12.0–15.0)
Immature Granulocytes: 0 %
Lymphocytes Relative: 38 %
Lymphs Abs: 1.8 10*3/uL (ref 0.7–4.0)
MCH: 26.6 pg (ref 26.0–34.0)
MCHC: 31.3 g/dL (ref 30.0–36.0)
MCV: 84.8 fL (ref 80.0–100.0)
Monocytes Absolute: 0.4 10*3/uL (ref 0.1–1.0)
Monocytes Relative: 9 %
Neutro Abs: 2.3 10*3/uL (ref 1.7–7.7)
Neutrophils Relative %: 50 %
Platelets: 303 10*3/uL (ref 150–400)
RBC: 3.95 MIL/uL (ref 3.87–5.11)
RDW: 17.9 % — ABNORMAL HIGH (ref 11.5–15.5)
WBC: 4.6 10*3/uL (ref 4.0–10.5)
nRBC: 0 % (ref 0.0–0.2)

## 2022-10-22 LAB — BASIC METABOLIC PANEL
Anion gap: 12 (ref 5–15)
BUN: 14 mg/dL (ref 6–20)
CO2: 27 mmol/L (ref 22–32)
Calcium: 8.7 mg/dL — ABNORMAL LOW (ref 8.9–10.3)
Chloride: 99 mmol/L (ref 98–111)
Creatinine, Ser: 0.66 mg/dL (ref 0.44–1.00)
GFR, Estimated: >60 mL/min (ref >60)
Glucose, Bld: 71 mg/dL (ref 70–99)
Potassium: 3.8 mmol/L (ref 3.5–5.1)
Sodium: 138 mmol/L (ref 135–145)

## 2022-10-22 LAB — HEMOGLOBIN A1C
Hgb A1c MFr Bld: 6.3 % — ABNORMAL HIGH (ref 4.8–5.6)
Mean Plasma Glucose: 134.11 mg/dL

## 2022-10-22 LAB — GLUCOSE, CAPILLARY
Glucose-Capillary: 108 mg/dL — ABNORMAL HIGH (ref 70–99)
Glucose-Capillary: 110 mg/dL — ABNORMAL HIGH (ref 70–99)
Glucose-Capillary: 124 mg/dL — ABNORMAL HIGH (ref 70–99)
Glucose-Capillary: 136 mg/dL — ABNORMAL HIGH (ref 70–99)
Glucose-Capillary: 142 mg/dL — ABNORMAL HIGH (ref 70–99)
Glucose-Capillary: 80 mg/dL (ref 70–99)

## 2022-10-22 MED ORDER — ENOXAPARIN SODIUM 30 MG/0.3ML IJ SOSY
30.0000 mg | PREFILLED_SYRINGE | INTRAMUSCULAR | Status: DC
  Administered 2022-10-22 – 2022-10-24 (×3): 30 mg via SUBCUTANEOUS
  Filled 2022-10-22 (×3): qty 0.3

## 2022-10-22 MED ORDER — SODIUM CHLORIDE 0.9% FLUSH
3.0000 mL | Freq: Two times a day (BID) | INTRAVENOUS | Status: DC
  Administered 2022-10-22 – 2022-10-24 (×5): 3 mL via INTRAVENOUS

## 2022-10-22 MED ORDER — LIVING WELL WITH DIABETES BOOK
Freq: Once | Status: AC
  Filled 2022-10-22: qty 1

## 2022-10-22 MED ORDER — DEXTROSE-SODIUM CHLORIDE 5-0.45 % IV SOLN: INTRAVENOUS | Status: DC

## 2022-10-22 MED ORDER — ACETAMINOPHEN 650 MG RE SUPP: 650.0000 mg | Freq: Four times a day (QID) | RECTAL | Status: DC | PRN

## 2022-10-22 MED ORDER — DEXTROSE 5 % IV SOLN: INTRAVENOUS | Status: DC

## 2022-10-22 MED ORDER — ACETAMINOPHEN 325 MG PO TABS: 650.0000 mg | ORAL_TABLET | Freq: Four times a day (QID) | ORAL | Status: DC | PRN

## 2022-10-22 MED ORDER — DEXTROSE 10 % IV SOLN: INTRAVENOUS | Status: DC

## 2022-10-22 NOTE — ED Notes (Signed)
Cabery juice and crackers provided to the pt.  

## 2022-10-22 NOTE — Inpatient Diabetes Management (Addendum)
Inpatient Diabetes Program Recommendations  AACE/ADA: New Consensus Statement on Inpatient Glycemic Control (2015)  Target Ranges:  Prepandial:   less than 140 mg/dL      Peak postprandial:   less than 180 mg/dL (1-2 hours)      Critically ill patients:  140 - 180 mg/dL   Lab Results  Component Value Date   GLUCAP 136 (H) 10/22/2022   HGBA1C 6.3 (H) 10/21/2022    Latest Reference Range & Units 10/22/22 00:57 10/22/22 01:58 10/22/22 02:23 10/22/22 03:40 10/22/22 05:28 10/22/22 06:16 10/22/22 07:20 10/22/22 07:59 10/22/22 09:53  Glucose-Capillary 70 - 99 mg/dL 92 46 (L) 54 (L) 70 56 (L) 57 (L) 99 132 (H) 124 (H) 136 (H)  (L): Data is abnormally low (H): Data is abnormally high  Review of Glycemic Control  Diabetes history: DM2 Outpatient Diabetes medications: Lantus 40 units qam, 30 units pm (order from Dr. Delford Field was Lantus 30 units am, 20 units pm) Trulicity 0.75 /weekly, Metformin (?not taking) Current orders for Inpatient glycemic control: CBGs only  Inpatient Diabetes Program Recommendations:   When CBGs start to elevate > 180, please consider: -Restart Lantus @ 8 units every day (0.2 units/kg x 46.3 kg = 9)  12:00 noon Spoke with patient @ bedside regarding glycemic control @ home. Patient states she lives alone and sometimes her fasting CBGs are in the 40's. Reviewed hypoglycemia protocol with patient and she was able to verbalize. Patient does not have support @ home but has been followed by congregational nursing. Discussed patient with Dr. Sherryll Burger and agree with plan to keep patient overnight to better estimate changes in insulin dosing.  -Consider lower dose of basal insulin once daily to prevent hypoglycemia.  Ordered Living Well With Diabetes for patient review.  Thank you, Billy Fischer. Davidmichael Zarazua, RN, MSN, CDE  Diabetes Coordinator Inpatient Glycemic Control Team Team Pager [phone] (8am-5pm) 10/22/2022 10:46 AM

## 2022-10-22 NOTE — ED Notes (Addendum)
This RN has not yet receiving return call from receiving unit and is currently unable to follow up with receiving unit on pt's transfer to floor due to another critical pt in assignment requiring 1:1 care at this time.  Oncoming day shift team will likely need to reach back out regarding transfer to unit.

## 2022-10-22 NOTE — Progress Notes (Addendum)
[name] is a pleasant 47 y.o. female with medical history significant for insulin-dependent diabetes mellitus, intellectual disability, and DiGeorge syndrome who now presents emergency department with hypoglycemia.  She was initially placed on D10 IV infusion due to profound hypoglycemia.  She has had discussion with diabetes coordinator and apparently has CBGs consistently in the 40s upon waking and does not have much support at home to help her with her insulin management.  It appears that she has also not been taking her home metformin as it makes her "feel bad."  A1c noted to be 6.3%.  Patient seen and evaluated at bedside and has been admitted after midnight.  She is now tolerating diet and has improved blood glucose levels and has been taken off of D10 infusion.  Diabetes coordinator recommending following blood glucose overnight so that insulin dosage can be adequately adjusted on discharge hopefully by a.m.  Continue to follow blood glucose levels carefully and keep off of insulin for now.  Total care time: 40 minutes.

## 2022-10-22 NOTE — ED Notes (Signed)
ED TO INPATIENT HANDOFF REPORT  ED Nurse Name and Phone #: Joselyn Glassman [phone])  S Name/Age/Gender [name] 47 y.o. female Room/Bed: 009C/009C  Code Status   Code Status: Full Code  Home/SNF/Other Home Patient oriented to: self, place, time, and situation Is this baseline? Yes   Triage Complete: Triage complete  Chief Complaint Hypoglycemia due to insulin [E16.0, T38.3X5A]  Triage Note Patient BIB [facility] from home with complaint of hypoglycemia.   Per EMS upon arrival CBG 58, EMS administered 15 mg oral glucose, repeat CBG 83.  Patient reported to EMS that she took 40u insuline this AM & 10u insulin later in the day.   Patient reports eating at 1400 today.  Patient also reports taking the additional 10u insulin with a cbg of 88 using home meter.   Patient A & O x 3 on arrival.    Allergies No Known Allergies  Level of Care/Admitting Diagnosis ED Disposition     ED Disposition  Admit   Condition  --   Comment  Hospital Area: [facility] [[id number]]  Level of Care: Telemetry Medical [104]  May place patient in observation at [facility] or [facility] if equivalent level of care is available:: Yes  Covid Evaluation: Asymptomatic - no recent exposure (last 10 days) testing not required  Diagnosis: Hypoglycemia due to insulin [478295]  Admitting Physician: Briscoe Deutscher [[id number]]  Attending Physician: Briscoe Deutscher [[id number]]          B Medical/Surgery History Past Medical History:  Diagnosis Date   Diabetes mellitus without complication (HCC)    DiGeorge syndrome (HCC)    Ectrodactyly    Genetic defect    genetic mutation of unknown significance per [facility] genetic notes   GERD (gastroesophageal reflux disease)    Hypercholesterolemia    Hypertension    Intellectual disability    Microcephaly (HCC)    Pelvic mass    Ventral hernia    Past Surgical History:  Procedure Laterality Date   CHOLECYSTECTOMY     HAND SURGERY Right     HERNIA REPAIR       A IV Location/Drains/Wounds Patient Lines/Drains/Airways Status     Active Line/Drains/Airways     Name Placement date Placement time Site Days   Peripheral IV 10/22/22 22 G Anterior;Right Forearm 10/22/22  0215  Forearm  less than 1            Intake/Output Last 24 hours  Intake/Output Summary (Last 24 hours) at 10/22/2022 0522 Last data filed at 10/22/2022 0341 Gross per 24 hour  Intake 163.87 ml  Output --  Net 163.87 ml    Labs/Imaging Results for orders placed or performed during the hospital encounter of 10/21/22 (from the past 48 hour(s))  CBG monitoring, ED     Status: None   Collection Time: 10/21/22 11:18 PM  Result Value Ref Range   Glucose-Capillary 81 70 - 99 mg/dL    Comment: Glucose reference range applies only to samples taken after fasting for at least 8 hours.  CBG monitoring, ED     Status: None   Collection Time: 10/21/22 11:39 PM  Result Value Ref Range   Glucose-Capillary 73 70 - 99 mg/dL    Comment: Glucose reference range applies only to samples taken after fasting for at least 8 hours.  CBC with Differential     Status: Abnormal   Collection Time: 10/21/22 11:40 PM  Result Value Ref Range   WBC 4.6 4.0 - 10.5 K/uL  RBC 3.95 3.87 - 5.11 MIL/uL   Hemoglobin 10.5 (L) 12.0 - 15.0 g/dL   HCT 16.1 (L) 09.6 - 04.5 %   MCV 84.8 80.0 - 100.0 fL   MCH 26.6 26.0 - 34.0 pg   MCHC 31.3 30.0 - 36.0 g/dL   RDW 40.9 (H) 81.1 - 91.4 %   Platelets 303 150 - 400 K/uL   nRBC 0.0 0.0 - 0.2 %   Neutrophils Relative % 50 %   Neutro Abs 2.3 1.7 - 7.7 K/uL   Lymphocytes Relative 38 %   Lymphs Abs 1.8 0.7 - 4.0 K/uL   Monocytes Relative 9 %   Monocytes Absolute 0.4 0.1 - 1.0 K/uL   Eosinophils Relative 3 %   Eosinophils Absolute 0.2 0.0 - 0.5 K/uL   Basophils Relative 0 %   Basophils Absolute 0.0 0.0 - 0.1 K/uL   Immature Granulocytes 0 %   Abs Immature Granulocytes 0.00 0.00 - 0.07 K/uL    Comment: Performed at [facility]  Lab, [address] [place], [place] [address]  Basic metabolic panel     Status: Abnormal   Collection Time: 10/21/22 11:40 PM  Result Value Ref Range   Sodium 138 135 - 145 mmol/L   Potassium 3.8 3.5 - 5.1 mmol/L   Chloride 99 98 - 111 mmol/L   CO2 27 22 - 32 mmol/L   Glucose, Bld 71 70 - 99 mg/dL    Comment: Glucose reference range applies only to samples taken after fasting for at least 8 hours.   BUN 14 6 - 20 mg/dL   Creatinine, Ser 6.21 0.44 - 1.00 mg/dL   Calcium 8.7 (L) 8.9 - 10.3 mg/dL   GFR, Estimated >30 >86 mL/min    Comment: (NOTE) Calculated using the CKD-EPI Creatinine Equation (2021)    Anion gap 12 5 - 15    Comment: Performed at [facility], [address], [place], [place] [address]  Hemoglobin A1c     Status: Abnormal   Collection Time: 10/21/22 11:40 PM  Result Value Ref Range   Hgb A1c MFr Bld 6.3 (H) 4.8 - 5.6 %    Comment: (NOTE) Pre diabetes:          5.7%-6.4%  Diabetes:              >6.4%  Glycemic control for   <7.0% adults with diabetes    Mean Plasma Glucose 134.11 mg/dL    Comment: Performed at [facility] [place] [facility], [address], [place], [place] [address]  CBG monitoring, ED     Status: None   Collection Time: 10/22/22 12:02 AM  Result Value Ref Range   Glucose-Capillary 72 70 - 99 mg/dL    Comment: Glucose reference range applies only to samples taken after fasting for at least 8 hours.  CBG monitoring, ED     Status: None   Collection Time: 10/22/22 12:57 AM  Result Value Ref Range   Glucose-Capillary 92 70 - 99 mg/dL    Comment: Glucose reference range applies only to samples taken after fasting for at least 8 hours.  CBG monitoring, ED     Status: Abnormal   Collection Time: 10/22/22  1:58 AM  Result Value Ref Range   Glucose-Capillary 46 (L) 70 - 99 mg/dL    Comment: Glucose reference range applies only to samples taken after fasting for at least 8 hours.  CBG monitoring, ED     Status: Abnormal   Collection Time:  10/22/22   2:23 AM  Result Value Ref Range   Glucose-Capillary 54 (L) 70 - 99 mg/dL    Comment: Glucose reference range applies only to samples taken after fasting for at least 8 hours.  CBG monitoring, ED     Status: None   Collection Time: 10/22/22  3:40 AM  Result Value Ref Range   Glucose-Capillary 70 70 - 99 mg/dL    Comment: Glucose reference range applies only to samples taken after fasting for at least 8 hours.   No results found.  Pending Labs Unresulted Labs (From admission, onward)     Start     Ordered   10/29/22 0500  Creatinine, serum  (enoxaparin (LOVENOX)    CrCl >/= 30 ml/min)  Weekly,   R     Comments: while on enoxaparin therapy    10/22/22 0341   10/23/22 0500  Basic metabolic panel  Tomorrow morning,   R        10/22/22 0341   10/23/22 0500  CBC  Tomorrow morning,   R        10/22/22 0341   10/21/22 2327  Urinalysis, Routine w reflex microscopic -Urine, Clean Catch  Once,   URGENT       Question:  Specimen Source  Answer:  Urine, Clean Catch   10/21/22 2326            Vitals/Pain Today's Vitals   10/22/22 0220 10/22/22 0228 10/22/22 0400 10/22/22 0500  BP: (!) 170/84 (!) 170/84    Pulse: 78 71 78 80  Resp:  18    Temp:  97.8 F (36.6 C)    TempSrc:  Oral    SpO2: 100% 100% 100% 100%  Weight:      Height:      PainSc:  0-No pain      Isolation Precautions No active isolations  Medications Medications  dextrose 5 % and 0.45 % NaCl infusion ( Intravenous New Bag/Given 10/22/22 0342)  enoxaparin (LOVENOX) injection 30 mg (has no administration in time range)  sodium chloride flush (NS) 0.9 % injection 3 mL (has no administration in time range)  acetaminophen (TYLENOL) tablet 650 mg (has no administration in time range)    Or  acetaminophen (TYLENOL) suppository 650 mg (has no administration in time range)    Mobility walks     Focused Assessments    R Recommendations: See Admitting Provider Note  Report given to:   Additional Notes:

## 2022-10-22 NOTE — ED Notes (Signed)
 Pt transferred to ED 9 and received for care at this time.  Bed in lowest position, wheels locked.  Call bell within reach.

## 2022-10-22 NOTE — ED Notes (Signed)
This RN reached out to Opyd MD about pt's current blood sugar.  Per Opyd MD, will change fluid orders to D10.

## 2022-10-22 NOTE — Plan of Care (Signed)
  Problem: Education: Goal: Ability to describe self-care measures that may prevent or decrease complications (Diabetes Survival Skills Education) will improve Outcome: Progressing   Problem: Nutritional: Goal: Maintenance of adequate nutrition will improve Outcome: Progressing   Problem: Education: Goal: Knowledge of General Education information will improve Description: Including pain rating scale, medication(s)/side effects and non-pharmacologic comfort measures Outcome: Progressing   Problem: Activity: Goal: Risk for activity intolerance will decrease Outcome: Progressing   Problem: Nutrition: Goal: Adequate nutrition will be maintained Outcome: Progressing   Problem: Safety: Goal: Ability to remain free from injury will improve Outcome: Progressing

## 2022-10-22 NOTE — ED Notes (Signed)
 Pt ambulatory to restroom with steady gait.

## 2022-10-22 NOTE — ED Notes (Signed)
This RN attempted to contact receiving unit about pt's ready bed and transfer to unit after blood sugar now stable and back up to 99.    Upon calling unit, this RN informed that the nurse receiving pt was handling a "situation" and would call this RN back.  This RN provided unit with name and call-back number.

## 2022-10-22 NOTE — ED Notes (Signed)
Pt eating graham crackers and peanut butter at this time.

## 2022-10-22 NOTE — H&P (Signed)
History and Physical    [name] IRC:[id number] DOB: 06/13/1975 DOA: 10/21/2022  PCP: Chauncy Lean, PA-C   Patient coming from: Home   Chief Complaint: Low blood sugar   HPI: [name] is a pleasant 47 y.o. female with medical history significant for insulin-dependent diabetes mellitus, intellectual disability, and DiGeorge syndrome who now presents emergency department with hypoglycemia.  Patient reports taking 40 units of Lantus this morning as she usually does, took another 10 units and mealtime, and then began to feel generally poor yesterday evening.  She called EMS, was found to have CBG of 58, was treated with oral glucose with improvement to 83, and she was brought into the ED.  Patient reports that she typically uses 40 units of Lantus in the morning and 20 in the evening, in addition to Trulicity and metformin.  She has had multiple ED visits for hypoglycemia and was hypoglycemic during her most recent hospital admission here, leading to reduction of her insulin dosing at time of discharge.  She does not remember when the dose was increased again.  She reports feeling back to normal now that her hypoglycemia has been treated and denies any recent fever, chills, vomiting, diarrhea, abdominal pain, or chest pain.  ED Course: Upon arrival to the ED, patient is found to be afebrile and saturating well on room air with normal heart rate and elevated blood pressure.  BMP is normal and CBC notable for hemoglobin 10.5.  Glucose dropped to the 40s in the ED despite IV dextrose with EMS and juice and food in the ED.    She was started on dextrose-containign IV fluids in the ED.  Review of Systems:  All other systems reviewed and apart from HPI, are negative.  Past Medical History:  Diagnosis Date   Diabetes mellitus without complication (HCC)    DiGeorge syndrome (HCC)    Ectrodactyly    Genetic defect    genetic mutation of unknown significance per [facility] genetic  notes   GERD (gastroesophageal reflux disease)    Hypercholesterolemia    Hypertension    Intellectual disability    Microcephaly (HCC)    Pelvic mass    Ventral hernia     Past Surgical History:  Procedure Laterality Date   CHOLECYSTECTOMY     HAND SURGERY Right    HERNIA REPAIR      Social History:   reports that she has never smoked. She has never used smokeless tobacco. She reports that she does not drink alcohol and does not use drugs.  No Known Allergies  Family History  Problem Relation Age of Onset   Diabetes Mother    GER disease Mother    Diabetes Father      Prior to Admission medications   Medication Sig Start Date End Date Taking? Authorizing Provider  Insulin Syringe-Needle U-100 30G X 5/16" 0.5 ML MISC Use to administer insulin 2 (two) times daily. 07/03/21   Storm Frisk, MD  Accu-Chek Softclix Lancets lancets Use 3 (three) times daily as directed 08/08/21   Storm Frisk, MD  Acetaminophen 500 MG capsule Take 500-1,000 mg by mouth every 6 (six) hours as needed for pain.    [provider]  ASPIRIN LOW DOSE 81 MG EC tablet Take 1 tablet (81 mg total) by mouth daily. 07/24/21   Storm Frisk, MD  Blood Glucose Monitoring Suppl (ACCU-CHEK GUIDE) w/Device KIT Use to check blood sugar 3 (three) times daily. 07/03/21   Storm Frisk, MD  EASY COMFORT PEN NEEDLES 31G X 5 MM MISC USE TWICE A DAY WITH INSULIN PEN 12/30/17   [provider]  ferrous sulfate 300 (60 Fe) MG/5ML syrup Take 5 mLs (300 mg total) by mouth daily. 06/28/22   Carmel Sacramento A, PA-C  fluticasone (FLONASE) 50 MCG/ACT nasal spray Place 1 spray into both nostrils daily. Patient taking differently: Place 1 spray into both nostrils daily as needed for allergies or rhinitis. 08/01/21   Zigmund Daniel., MD  glucose blood (ACCU-CHEK GUIDE) test strip Use 3 (three) times daily as instructed 07/03/21   Storm Frisk, MD  insulin glargine (LANTUS) 100 UNIT/ML  injection Inject 0.3 mLs (30 Units total) into the skin in the morning AND 0.2 mLs (20 Units total) every evening. Patient taking differently: Inject 40 units into the skin in the morning and 30 units at bedtime 07/03/21   Storm Frisk, MD  Insulin Syringe-Needle U-100 (INSULIN SYRINGE .5CC/31GX5/16") 31G X 5/16" 0.5 ML MISC Inject 1 Syringe into the skin daily. 09/18/21   Storm Frisk, MD  Lancets Misc. (ACCU-CHEK SOFTCLIX LANCET DEV) KIT Use to check blood sugar twice a day 07/24/21   Storm Frisk, MD  metFORMIN (GLUCOPHAGE) 500 MG tablet Take 1 tablet (500 mg total) by mouth 2 (two) times daily with a meal. 07/24/21   Storm Frisk, MD  omeprazole (PRILOSEC) 20 MG capsule Take 1 capsule (20 mg total) by mouth daily. Patient taking differently: Take 20 mg by mouth daily before breakfast. 06/26/17   Mathews Robinsons B, PA-C  ondansetron (ZOFRAN) 4 MG tablet Take 1 tablet (4 mg total) by mouth every 6 (six) hours. Patient not taking: Reported on 12/17/2021 02/07/17   Donnetta Hutching, MD  pravastatin (PRAVACHOL) 20 MG tablet Take 20 mg by mouth daily.    [provider]  silver sulfADIAZINE (SILVADENE) 1 % cream Apply 1 application topically to wound on both ankles once daily. Patient not taking: Reported on 12/17/2021 07/03/21   Storm Frisk, MD    Physical Exam: Vitals:   10/21/22 2313 10/21/22 2353 10/22/22 0220 10/22/22 0228  BP:   (!) 170/84 (!) 170/84  Pulse:  78 78 71  Resp:    18  Temp:    97.8 F (36.6 C)  TempSrc:    Oral  SpO2:  100% 100% 100%  Weight: 46.3 kg     Height: 4\' 9"  (1.448 m)       Constitutional: NAD, calm  Eyes: PERTLA, lids and conjunctivae normal ENMT: Mucous membranes are moist. Posterior pharynx clear of any exudate or lesions.   Neck: supple, no masses  Respiratory: no wheezing, no crackles. No accessory muscle use.  Cardiovascular: S1 & S2 heard, regular rate and rhythm. No JVD. Abdomen: No distension, no tenderness, soft. Bowel  sounds active.  Musculoskeletal: no clubbing / cyanosis. Skeletal deformities.   Skin: no significant rashes, lesions, ulcers. Warm, dry, well-perfused. Neurologic: CN 2-12 grossly intact. Moving all extremities. Alert and oriented to person, place, and situation.  Psychiatric: Pleasant. Cooperative.    Labs and Imaging on Admission: I have personally reviewed following labs and imaging studies  CBC: Recent Labs  Lab 10/21/22 2340  WBC 4.6  NEUTROABS 2.3  HGB 10.5*  HCT 33.5*  MCV 84.8  PLT 303   Basic Metabolic Panel: Recent Labs  Lab 10/21/22 2340  NA 138  K 3.8  CL 99  CO2 27  GLUCOSE 71  BUN 14  CREATININE 0.66  CALCIUM  8.7*   GFR: Estimated Creatinine Clearance: 53 mL/min (by C-G formula based on SCr of 0.66 mg/dL). Liver Function Tests: No results for input(s): "AST", "ALT", "ALKPHOS", "BILITOT", "PROT", "ALBUMIN" in the last 168 hours. No results for input(s): "LIPASE", "AMYLASE" in the last 168 hours. No results for input(s): "AMMONIA" in the last 168 hours. Coagulation Profile: No results for input(s): "INR", "PROTIME" in the last 168 hours. Cardiac Enzymes: No results for input(s): "CKTOTAL", "CKMB", "CKMBINDEX", "TROPONINI" in the last 168 hours. BNP (last 3 results) No results for input(s): "PROBNP" in the last 8760 hours. HbA1C: No results for input(s): "HGBA1C" in the last 72 hours. CBG: Recent Labs  Lab 10/21/22 2339 10/22/22 0002 10/22/22 0057 10/22/22 0158 10/22/22 0223  GLUCAP 73 72 92 46* 54*   Lipid Profile: No results for input(s): "CHOL", "HDL", "LDLCALC", "TRIG", "CHOLHDL", "LDLDIRECT" in the last 72 hours. Thyroid Function Tests: No results for input(s): "TSH", "T4TOTAL", "FREET4", "T3FREE", "THYROIDAB" in the last 72 hours. Anemia Panel: No results for input(s): "VITAMINB12", "FOLATE", "FERRITIN", "TIBC", "IRON", "RETICCTPCT" in the last 72 hours. Urine analysis:    Component Value Date/Time   COLORURINE AMBER (A) 07/24/2022  1745   APPEARANCEUR CLOUDY (A) 07/24/2022 1745   LABSPEC 1.021 07/24/2022 1745   PHURINE 5.0 07/24/2022 1745   GLUCOSEU NEGATIVE 07/24/2022 1745   HGBUR SMALL (A) 07/24/2022 1745   BILIRUBINUR NEGATIVE 07/24/2022 1745   KETONESUR 20 (A) 07/24/2022 1745   PROTEINUR 100 (A) 07/24/2022 1745   UROBILINOGEN 0.2 01/15/2007 2203   NITRITE NEGATIVE 07/24/2022 1745   LEUKOCYTESUR LARGE (A) 07/24/2022 1745   Sepsis Labs: @LABRCNTIP (procalcitonin:4,lacticidven:4) )No results found for this or any previous visit (from the past 240 hour(s)).   Radiological Exams on Admission: No results found.   Assessment/Plan   1. Hypoglycemia; insulin-dependent DM  - Recurrent hypoglycemia in ED after treatment by EMS and again in ED   - Continue dextrose IVF with frequent CBGs, update A1c (was 7.8% in June 2022), consult diabetes educator   DVT prophylaxis: Lovenox  Code Status: Full  Level of Care: Level of care: Telemetry Medical Family Communication: None present   Disposition Plan:  Patient is from: home  Anticipated d/c is to: TBD Anticipated d/c date is: 10/23/22  Patient currently: Pending stable blood sugar Consults called: None  Admission status: Observation     Briscoe Deutscher, MD [facility]  10/22/2022, 3:41 AM

## 2022-10-23 DIAGNOSIS — T383X5A Adverse effect of insulin and oral hypoglycemic [antidiabetic] drugs, initial encounter: Secondary | ICD-10-CM | POA: Diagnosis not present

## 2022-10-23 DIAGNOSIS — E160 Drug-induced hypoglycemia without coma: Secondary | ICD-10-CM | POA: Diagnosis not present

## 2022-10-23 LAB — BASIC METABOLIC PANEL
Anion gap: 5 (ref 5–15)
BUN: 13 mg/dL (ref 6–20)
CO2: 28 mmol/L (ref 22–32)
Calcium: 8.9 mg/dL (ref 8.9–10.3)
Chloride: 102 mmol/L (ref 98–111)
Creatinine, Ser: 0.91 mg/dL (ref 0.44–1.00)
GFR, Estimated: >60 mL/min (ref >60)
Glucose, Bld: 138 mg/dL — ABNORMAL HIGH (ref 70–99)
Potassium: 4.4 mmol/L (ref 3.5–5.1)
Sodium: 135 mmol/L (ref 135–145)

## 2022-10-23 LAB — CBC
HCT: 34.2 % — ABNORMAL LOW (ref 36.0–46.0)
Hemoglobin: 10.9 g/dL — ABNORMAL LOW (ref 12.0–15.0)
MCH: 26.7 pg (ref 26.0–34.0)
MCHC: 31.9 g/dL (ref 30.0–36.0)
MCV: 83.8 fL (ref 80.0–100.0)
Platelets: 287 10*3/uL (ref 150–400)
RBC: 4.08 MIL/uL (ref 3.87–5.11)
RDW: 17.2 % — ABNORMAL HIGH (ref 11.5–15.5)
WBC: 5.2 10*3/uL (ref 4.0–10.5)
nRBC: 0 % (ref 0.0–0.2)

## 2022-10-23 LAB — GLUCOSE, CAPILLARY
Glucose-Capillary: 128 mg/dL — ABNORMAL HIGH (ref 70–99)
Glucose-Capillary: 127 mg/dL — ABNORMAL HIGH (ref 70–99)
Glucose-Capillary: 203 mg/dL — ABNORMAL HIGH (ref 70–99)
Glucose-Capillary: 177 mg/dL — ABNORMAL HIGH (ref 70–99)
Glucose-Capillary: 239 mg/dL — ABNORMAL HIGH (ref 70–99)

## 2022-10-23 MED ORDER — PRAVASTATIN SODIUM 40 MG PO TABS
20.0000 mg | ORAL_TABLET | Freq: Every day | ORAL | Status: DC
  Administered 2022-10-23 – 2022-10-24 (×2): 20 mg via ORAL
  Filled 2022-10-23 (×2): qty 1

## 2022-10-23 MED ORDER — VITAMIN D3 25 MCG PO TABS
5000.0000 [IU] | ORAL_TABLET | ORAL | Status: DC
  Administered 2022-10-24: 5000 [IU] via ORAL
  Filled 2022-10-23 (×2): qty 5

## 2022-10-23 MED ORDER — INSULIN GLARGINE-YFGN 100 UNIT/ML ~~LOC~~ SOLN
5.0000 [IU] | Freq: Two times a day (BID) | SUBCUTANEOUS | Status: DC
  Administered 2022-10-23 – 2022-10-24 (×3): 5 [IU] via SUBCUTANEOUS
  Filled 2022-10-23 (×4): qty 0.05

## 2022-10-23 MED ORDER — ASPIRIN 81 MG PO TBEC
81.0000 mg | DELAYED_RELEASE_TABLET | Freq: Every day | ORAL | Status: DC
  Administered 2022-10-23 – 2022-10-24 (×2): 81 mg via ORAL
  Filled 2022-10-23 (×2): qty 1

## 2022-10-23 MED ORDER — PANTOPRAZOLE SODIUM 40 MG PO TBEC
40.0000 mg | DELAYED_RELEASE_TABLET | Freq: Every day | ORAL | Status: DC
  Administered 2022-10-23 – 2022-10-24 (×2): 40 mg via ORAL
  Filled 2022-10-23 (×2): qty 1

## 2022-10-23 MED ORDER — FERROUS SULFATE 300 (60 FE) MG/5ML PO SOLN
300.0000 mg | ORAL | Status: DC
  Administered 2022-10-24: 300 mg via ORAL
  Filled 2022-10-23: qty 5

## 2022-10-23 NOTE — Plan of Care (Signed)
  Problem: Education: Goal: Ability to describe self-care measures that may prevent or decrease complications (Diabetes Survival Skills Education) will improve Outcome: Not Progressing Goal: Individualized Educational Video(s) Outcome: Not Progressing   Problem: Coping: Goal: Ability to adjust to condition or change in health will improve Outcome: Not Progressing   Problem: Fluid Volume: Goal: Ability to maintain a balanced intake and output will improve Outcome: Not Progressing   Problem: Health Behavior/Discharge Planning: Goal: Ability to identify and utilize available resources and services will improve Outcome: Not Progressing Goal: Ability to manage health-related needs will improve Outcome: Not Progressing   Problem: Metabolic: Goal: Ability to maintain appropriate glucose levels will improve Outcome: Not Progressing   

## 2022-10-23 NOTE — Progress Notes (Signed)
PROGRESS NOTE  [name] ZOX:[id number] DOB: 1976-01-03 DOA: 10/21/2022 PCP: Chauncy Lean, PA-C  HPI/Recap of past 24 hours:  [name] is a pleasant 47 y.o. female with medical history significant for insulin-dependent diabetes mellitus, intellectual disability, and DiGeorge syndrome who now presents emergency department with hypoglycemia.  She was initially placed on D10 IV infusion due to profound hypoglycemia.  She has had discussion with diabetes coordinator and apparently has CBGs consistently in the 40s upon waking and does not have much support at home to help her with her insulin management.  It appears that she has also not been taking her home metformin as it makes her "feel bad."  A1c noted to be 6.3%.   10/23/2022: The patient was seen and examined at her bedside.  Home insulin was held on admission due to severe hypoglycemia.  Now, D10 has been turned off due to hyperglycemia.  Restarting insulin coverage.  Will closely monitor to ensure hypoglycemia does not recur.   Assessment/Plan: Principal Problem:   Hypoglycemia due to insulin Active Problems:   Intellectual disability   Insulin-requiring or dependent type II diabetes mellitus (HCC)  Hypoglycemia, insulin-dependent type 2 diabetes Hemoglobin A1c 6.3 on 10/21/2022, from 13.7's previously in 2018 Initially presented with blood glucose in the 40s Received D10, now off Hyperglycemia this morning with blood sugar in the 200s Start Semglee 5 units twice daily Insulin sliding scale. Diabetes coordinator consulted, appreciate assistance.  DiGeorge syndrome No acute issues  Intellectual disability May need assistance with medication management The patient lives alone   Time: 55 minutes.   Code Status: Full code  Family Communication: None at bedside  Disposition Plan: Likely will discharge to home on 10/24/2022 once blood sugar is better controlled.   Consultants: Diabetes  coordinator  Procedures: None  Antimicrobials: None  DVT prophylaxis: Subcu Lovenox daily  Status is: Observation     Objective: Vitals:   10/22/22 1440 10/22/22 2004 10/23/22 0427 10/23/22 0754  BP: 135/74 132/66 (!) 140/81 130/82  Pulse: 87 94 85 95  Resp:  18 17   Temp: 97.8 F (36.6 C) 98 F (36.7 C)    TempSrc: Oral Oral    SpO2: 100% 98% 100% 100%  Weight:      Height:        Intake/Output Summary (Last 24 hours) at 10/23/2022 1425 Last data filed at 10/22/2022 2230 Gross per 24 hour  Intake 360 ml  Output --  Net 360 ml   Filed Weights   10/21/22 2313  Weight: 46.3 kg    Exam:  General: 47 y.o. year-old female well developed well nourished in no acute distress.  Alert and oriented x3. Cardiovascular: Regular rate and rhythm with no rubs or gallops.  No thyromegaly or JVD noted.   Respiratory: Clear to auscultation with no wheezes or rales. Good inspiratory effort. Abdomen: Soft nontender nondistended with normal bowel sounds x4 quadrants. Musculoskeletal: No lower extremity edema. 2/4 pulses in all 4 extremities. Skin: No ulcerative lesions noted or rashes, Psychiatry: Mood is appropriate for condition and setting   Data Reviewed: CBC: Recent Labs  Lab 10/21/22 2340 10/23/22 0123  WBC 4.6 5.2  NEUTROABS 2.3  --   HGB 10.5* 10.9*  HCT 33.5* 34.2*  MCV 84.8 83.8  PLT 303 287   Basic Metabolic Panel: Recent Labs  Lab 10/21/22 2340 10/23/22 0123  NA 138 135  K 3.8 4.4  CL 99 102  CO2 27 28  GLUCOSE 71 138*  BUN 14 13  CREATININE 0.66 0.91  CALCIUM 8.7* 8.9   GFR: Estimated Creatinine Clearance: 46.6 mL/min (by C-G formula based on SCr of 0.91 mg/dL). Liver Function Tests: No results for input(s): "AST", "ALT", "ALKPHOS", "BILITOT", "PROT", "ALBUMIN" in the last 168 hours. No results for input(s): "LIPASE", "AMYLASE" in the last 168 hours. No results for input(s): "AMMONIA" in the last 168 hours. Coagulation Profile: No results  for input(s): "INR", "PROTIME" in the last 168 hours. Cardiac Enzymes: No results for input(s): "CKTOTAL", "CKMB", "CKMBINDEX", "TROPONINI" in the last 168 hours. BNP (last 3 results) No results for input(s): "PROBNP" in the last 8760 hours. HbA1C: Recent Labs    10/21/22 2340  HGBA1C 6.3*   CBG: Recent Labs  Lab 10/22/22 2103 10/22/22 2357 10/23/22 0425 10/23/22 0756 10/23/22 1116  GLUCAP 142* 108* 128* 127* 203*   Lipid Profile: No results for input(s): "CHOL", "HDL", "LDLCALC", "TRIG", "CHOLHDL", "LDLDIRECT" in the last 72 hours. Thyroid Function Tests: No results for input(s): "TSH", "T4TOTAL", "FREET4", "T3FREE", "THYROIDAB" in the last 72 hours. Anemia Panel: No results for input(s): "VITAMINB12", "FOLATE", "FERRITIN", "TIBC", "IRON", "RETICCTPCT" in the last 72 hours. Urine analysis:    Component Value Date/Time   COLORURINE AMBER (A) 07/24/2022 1745   APPEARANCEUR CLOUDY (A) 07/24/2022 1745   LABSPEC 1.021 07/24/2022 1745   PHURINE 5.0 07/24/2022 1745   GLUCOSEU NEGATIVE 07/24/2022 1745   HGBUR SMALL (A) 07/24/2022 1745   BILIRUBINUR NEGATIVE 07/24/2022 1745   KETONESUR 20 (A) 07/24/2022 1745   PROTEINUR 100 (A) 07/24/2022 1745   UROBILINOGEN 0.2 01/15/2007 2203   NITRITE NEGATIVE 07/24/2022 1745   LEUKOCYTESUR LARGE (A) 07/24/2022 1745   Sepsis Labs: @LABRCNTIP (procalcitonin:4,lacticidven:4)  )No results found for this or any previous visit (from the past 240 hour(s)).    Studies: No results found.  Scheduled Meds:  aspirin EC  81 mg Oral Daily   enoxaparin (LOVENOX) injection  30 mg Subcutaneous Q24H   ferrous sulfate  300 mg Oral Daily   insulin glargine-yfgn  5 Units Subcutaneous BID   pantoprazole  40 mg Oral Daily   pravastatin  20 mg Oral Daily   sodium chloride flush  3 mL Intravenous Q12H   Vitamin D3  1 capsule Oral Weekly    Continuous Infusions:   LOS: 0 days     Darlin Drop, MD [facility] Pager [phone]  If  7PM-7AM, please contact night-coverage [email or web address] Password [email or web address] 10/23/2022, 2:25 PM

## 2022-10-24 DIAGNOSIS — T383X5A Adverse effect of insulin and oral hypoglycemic [antidiabetic] drugs, initial encounter: Secondary | ICD-10-CM | POA: Diagnosis not present

## 2022-10-24 DIAGNOSIS — E160 Drug-induced hypoglycemia without coma: Secondary | ICD-10-CM | POA: Diagnosis not present

## 2022-10-24 LAB — GLUCOSE, CAPILLARY
Glucose-Capillary: 148 mg/dL — ABNORMAL HIGH (ref 70–99)
Glucose-Capillary: 148 mg/dL — ABNORMAL HIGH (ref 70–99)
Glucose-Capillary: 179 mg/dL — ABNORMAL HIGH (ref 70–99)
Glucose-Capillary: 232 mg/dL — ABNORMAL HIGH (ref 70–99)

## 2022-10-24 MED ORDER — INSULIN GLARGINE 100 UNIT/ML SOLOSTAR PEN: 6.0000 [IU] | PEN_INJECTOR | Freq: Two times a day (BID) | SUBCUTANEOUS | 0 refills | Status: DC

## 2022-10-24 MED ORDER — INSULIN ASPART 100 UNIT/ML FLEXPEN: 3.0000 [IU] | PEN_INJECTOR | Freq: Three times a day (TID) | SUBCUTANEOUS | 0 refills | Status: DC

## 2022-10-24 NOTE — Plan of Care (Signed)
Adequate for discharge.

## 2022-10-24 NOTE — Discharge Summary (Signed)
Discharge Summary  [name] BMW:[id number] DOB: 07-10-1975  PCP: Chauncy Lean, PA-C  Admit date: 10/21/2022 Discharge date: 10/24/2022  Time spent: 35 minutes   Recommendations for Outpatient Follow-up:  Follow up with your PCP   Discharge Diagnoses:  Active Hospital Problems   Diagnosis Date Noted   Hypoglycemia due to insulin 10/22/2022   Insulin-requiring or dependent type II diabetes mellitus (HCC) 12/17/2021   Intellectual disability 03/09/2018    Resolved Hospital Problems  No resolved problems to display.    Discharge Condition: Stable   Diet recommendation: Resume previous diet   Vitals:   10/23/22 1440 10/23/22 2021  BP: (!) 120/59 105/68  Pulse: 94 92  Resp:  18  Temp: 97.6 F (36.4 C) 98.2 F (36.8 C)  SpO2: 100% 100%    History of present illness:   [name] is a pleasant 47 y.o. female with medical history significant for insulin-dependent diabetes mellitus, intellectual disability, and DiGeorge syndrome who now presents emergency department with hypoglycemia.  She was initially placed on D10 IV infusion due to profound hypoglycemia.  She has had discussion with diabetes coordinator and apparently has CBGs consistently in the 40s upon waking and does not have much support at home to help her with her insulin management.  It appears that she has also not been taking her home metformin as it makes her "feel bad."  A1c noted to be 6.3%.  Home insulin was held on admission due to severe hypoglycemia.  D10 was turned off due to hyperglycemia.  Sq insulin restarted.   10/24/2022: no new complaints,    Principal Problem:   Hypoglycemia due to insulin Active Problems:   Intellectual disability   Insulin-requiring or dependent type II diabetes mellitus (HCC)  Hypoglycemia, insulin-dependent type 2 diabetes Hemoglobin A1c 6.3 on 10/21/2022, from 13.7's previously in 2018 Initially presented with blood glucose in the 40s Received D10, now  off Symglee 6 U bid and novolog pen   DiGeorge syndrome No acute issues    Discharge Exam: BP 105/68 (BP Location: Left Arm)   Pulse 92   Temp 98.2 F (36.8 C)   Resp 18   Ht 4\' 9"  (1.448 m)   Wt 46.3 kg   SpO2 100%   BMI 22.07 kg/m  General: 47 y.o. year-old female well developed well nourished in no acute distress.  Alert and oriented x3. Cardiovascular: Regular rate and rhythm with no rubs or gallops.  No thyromegaly or JVD noted.   Respiratory: Clear to auscultation with no wheezes or rales. Good inspiratory effort. Abdomen: Soft nontender nondistended with normal bowel sounds x4 quadrants. Musculoskeletal: No lower extremity edema. 2/4 pulses in all 4 extremities. Skin: No ulcerative lesions noted or rashes, Psychiatry: Mood is appropriate for condition and setting  Discharge Instructions You were cared for by a hospitalist during your hospital stay. If you have any questions about your discharge medications or the care you received while you were in the hospital after you are discharged, you can call the unit and asked to speak with the hospitalist on call if the hospitalist that took care of you is not available. Once you are discharged, your primary care physician will handle any further medical issues. Please note that NO REFILLS for any discharge medications will be authorized once you are discharged, as it is imperative that you return to your primary care physician (or establish a relationship with a primary care physician if you do not have one) for your aftercare needs so that they  can reassess your need for medications and monitor your lab values.   Allergies as of 10/24/2022   No Known Allergies      Medication List     STOP taking these medications    fluticasone 50 MCG/ACT nasal spray Commonly known as: FLONASE   Lantus 100 UNIT/ML injection Generic drug: insulin glargine Replaced by: insulin glargine 100 UNIT/ML Solostar Pen   metFORMIN 500 MG  tablet Commonly known as: GLUCOPHAGE       TAKE these medications    Accu-Chek Guide test strip Generic drug: glucose blood Use 3 (three) times daily as instructed   Accu-Chek Softclix Lancets lancets Use 3 (three) times daily as directed   Aspirin Low Dose 81 MG tablet Generic drug: aspirin EC Take 1 tablet (81 mg total) by mouth daily. What changed: when to take this   ferrous sulfate 300 (60 Fe) MG/5ML syrup Take 5 mLs (300 mg total) by mouth daily. What changed:  when to take this additional instructions   insulin aspart 100 UNIT/ML FlexPen Commonly known as: NOVOLOG Inject 3 Units into the skin 3 (three) times daily with meals.   insulin glargine 100 UNIT/ML Solostar Pen Commonly known as: LANTUS Inject 6 Units into the skin 2 (two) times daily. Replaces: Lantus 100 UNIT/ML injection   omeprazole 20 MG capsule Commonly known as: PRILOSEC Take 1 capsule (20 mg total) by mouth daily. What changed: when to take this   pravastatin 20 MG tablet Commonly known as: PRAVACHOL Take 20 mg by mouth daily.   Trulicity 0.75 MG/0.5ML Sopn Generic drug: Dulaglutide Inject 0.75 mg into the skin once a week.   Vitamin D3 1.25 MG (50000 UT) Caps Take 1 capsule by mouth once a week.       No Known Allergies  Follow-up Information     Chauncy Lean, PA-C Follow up.   Specialty: Internal Medicine Contact information: [address] [place] [address] [phone]                  The results of significant diagnostics from this hospitalization (including imaging, microbiology, ancillary and laboratory) are listed below for reference.    Significant Diagnostic Studies: No results found.  Microbiology: No results found for this or any previous visit (from the past 240 hour(s)).   Labs: Basic Metabolic Panel: Recent Labs  Lab 10/21/22 2340 10/23/22 0123  NA 138 135  K 3.8 4.4  CL 99 102  CO2 27 28  GLUCOSE 71 138*  BUN 14 13   CREATININE 0.66 0.91  CALCIUM 8.7* 8.9   Liver Function Tests: No results for input(s): "AST", "ALT", "ALKPHOS", "BILITOT", "PROT", "ALBUMIN" in the last 168 hours. No results for input(s): "LIPASE", "AMYLASE" in the last 168 hours. No results for input(s): "AMMONIA" in the last 168 hours. CBC: Recent Labs  Lab 10/21/22 2340 10/23/22 0123  WBC 4.6 5.2  NEUTROABS 2.3  --   HGB 10.5* 10.9*  HCT 33.5* 34.2*  MCV 84.8 83.8  PLT 303 287   Cardiac Enzymes: No results for input(s): "CKTOTAL", "CKMB", "CKMBINDEX", "TROPONINI" in the last 168 hours. BNP: BNP (last 3 results) No results for input(s): "BNP" in the last 8760 hours.  ProBNP (last 3 results) No results for input(s): "PROBNP" in the last 8760 hours.  CBG: Recent Labs  Lab 10/23/22 2023 10/24/22 0011 10/24/22 0615 10/24/22 0752 10/24/22 1129  GLUCAP 239* 179* 148* 148* 232*       Signed:  Darlin Drop, MD  [facility] 10/24/2022, 4:56 PM

## 2022-10-24 NOTE — Plan of Care (Signed)

## 2022-10-28 NOTE — Congregational Nurse Program (Unsigned)
  Dept: [phone]   Congregational Nurse Program Note  Date of Encounter: 10/28/2022  Past Medical History: Past Medical History:  Diagnosis Date   Diabetes mellitus without complication (HCC)    DiGeorge syndrome (HCC)    Ectrodactyly    Genetic defect    genetic mutation of unknown significance per [facility] genetic notes   GERD (gastroesophageal reflux disease)    Hypercholesterolemia    Hypertension    Intellectual disability    Microcephaly (HCC)    Pelvic mass    Ventral hernia     Encounter Details:

## 2022-11-04 NOTE — Congregational Nurse Program (Signed)
  Dept: [phone]   Congregational Nurse Program Note  Date of Encounter: 11/04/2022  Clinic visit to assist with Trulicity injection.  Blood glucose per her report have been in the low 100's each morning and not higher than 170's in the evening since insulin dosages were reduced.  Reviewed food intake for this week & encouraged her to eat more vegetables as recommended in the colonoscopy discharge report last week.  BP 112/76  Past Medical History: Past Medical History:  Diagnosis Date   Diabetes mellitus without complication (HCC)    DiGeorge syndrome (HCC)    Ectrodactyly    Genetic defect    genetic mutation of unknown significance per [facility] genetic notes   GERD (gastroesophageal reflux disease)    Hypercholesterolemia    Hypertension    Intellectual disability    Microcephaly (HCC)    Pelvic mass    Ventral hernia     Encounter Details:  CNP Questionnaire - 11/04/22 1740       Questionnaire   Ask client: Do you give verbal consent for me to treat you today? Yes    Student Assistance N/A    [place]    Visit Setting with Client Organization    Patient Status Unknown   Has own apartment at [facility]    Insurance/Financial Assistance Referral N/A    Medication N/A    Medical Provider Yes    Screening Referrals Made N/A    Medical Referrals Made N/A    Medical Appointment Made N/A    Recently w/o PCP, now 1st time PCP visit completed due to CNs referral or appointment made N/A    Food Have Food Insecurities    Transportation Need transportation assistance    Housing/Utilities N/A    Interpersonal Safety N/A    Interventions Advocate/Support;Counsel;Educate    Abnormal to Normal Screening Since Last CN Visit N/A    Screenings CN Performed Blood Pressure;Pulse Ox    Sent Client to Lab for: N/A    Did client attend any of the following based off CNs referral or appointments made? N/A    ED Visit  Averted N/A    Life-Saving Intervention Made N/A

## 2022-11-12 ENCOUNTER — Encounter: Payer: Self-pay | Admitting: *Deleted

## 2022-11-12 NOTE — Congregational Nurse Program (Signed)
  Dept: [phone]   Congregational Nurse Program Note  Date of Encounter: 11/12/2022  Past Medical History: Past Medical History:  Diagnosis Date   Diabetes mellitus without complication (HCC)    DiGeorge syndrome (HCC)    Ectrodactyly    Genetic defect    genetic mutation of unknown significance per [facility] genetic notes   GERD (gastroesophageal reflux disease)    Hypercholesterolemia    Hypertension    Intellectual disability    Microcephaly (HCC)    Pelvic mass    Ventral hernia     Encounter Details:  CNP Questionnaire - 11/12/22 1211       Questionnaire   Ask client: Do you give verbal consent for me to treat you today? Yes    Student Assistance N/A    Location Patient Served  [facility]    Visit Setting with Client Organization    Patient Status Unknown    Insurance Medicaid    Insurance/Financial Assistance Referral N/A    Medication N/A    Medical Provider Yes    Screening Referrals Made N/A    Medical Referrals Made N/A    Medical Appointment Made N/A    Recently w/o PCP, now 1st time PCP visit completed due to CNs referral or appointment made N/A    Food Have Food Insecurities    Transportation Need transportation assistance;Provided transportation assistance    Housing/Utilities N/A    Interpersonal Safety N/A    Interventions Advocate/Support;Educate    Abnormal to Normal Screening Since Last CN Visit N/A    Screenings CN Performed Blood Pressure    Sent Client to Lab for: N/A    Did client attend any of the following based off CNs referral or appointments made? N/A    ED Visit Averted N/A    Life-Saving Intervention Made N/A            Client came to [facility] clinic for assistance with Trulicity. Checked blood pressure. Blood pressure 124/81, pulse 79. Did not check cbg as client had recently eaten. Client had one syringe of Trulicity. Watched client give herself injection In lt abdominal area. Educated client on importance of giving slow and waiting on  second click. Encouraged client to make sure she had refill for next week as this was her last dose. Client contacted pharmacy while in nurse's office. Gave bus pass to pick up medication.  Jyra Lagares W RN CN

## 2022-11-14 NOTE — Congregational Nurse Program (Signed)
  Dept: [phone]   Congregational Nurse Program Note  Date of Encounter: 11/11/2022  Clinic visit today without her Trulicity medication for weekly injection.  Given information for clinic hours at [facility] for the remainder of the week, counseled regarding necessity to have the injection weekly as prescribed. Past Medical History: Past Medical History:  Diagnosis Date   Diabetes mellitus without complication (HCC)    DiGeorge syndrome (HCC)    Ectrodactyly    Genetic defect    genetic mutation of unknown significance per [facility] genetic notes   GERD (gastroesophageal reflux disease)    Hypercholesterolemia    Hypertension    Intellectual disability    Microcephaly (HCC)    Pelvic mass    Ventral hernia     Encounter Details:  CNP Questionnaire - 11/11/22 1225       Questionnaire   Ask client: Do you give verbal consent for me to treat you today? Yes    Student Assistance N/A    Location Patient Served  [facility]    Visit Setting with Client Organization    Patient Status Unknown   Has own apartment at [facility]    Insurance/Financial Assistance Referral N/A    Medication N/A    Medical Provider Yes    Screening Referrals Made N/A    Medical Referrals Made N/A    Medical Appointment Made N/A    Recently w/o PCP, now 1st time PCP visit completed due to CNs referral or appointment made N/A    Food Have Food Insecurities    Transportation Need transportation assistance    Housing/Utilities N/A    Interpersonal Safety N/A    Interventions Advocate/Support;Counsel    Abnormal to Normal Screening Since Last CN Visit N/A    Screenings CN Performed N/A    Sent Client to Lab for: N/A    Did client attend any of the following based off CNs referral or appointments made? N/A    ED Visit Averted N/A    Life-Saving Intervention Made N/A

## 2022-11-18 NOTE — Congregational Nurse Program (Unsigned)
Dept: [phone]   Congregational Nurse Program Note  Date of Encounter: 11/18/2022  Past Medical History: Past Medical History:  Diagnosis Date   Diabetes mellitus without complication (HCC)    DiGeorge syndrome (HCC)    Ectrodactyly    Genetic defect    genetic mutation of unknown significance per [facility] genetic notes   GERD (gastroesophageal reflux disease)    Hypercholesterolemia    Hypertension    Intellectual disability    Microcephaly (HCC)    Pelvic mass    Ventral hernia     Encounter Details:  CNP Questionnaire - 11/18/22 1150       Questionnaire   Ask client: Do you give verbal consent for me to treat you today? Yes    Student Assistance N/A    Location Patient Served  [facility]    Visit Setting with Client Organization    Patient Status Unknown   Has own apartment at [facility]    Insurance/Financial Assistance Referral N/A    Medication N/A    Medical Provider Yes    Screening Referrals Made N/A    Medical Referrals Made N/A    Medical Appointment Made N/A    Recently w/o PCP, now 1st time PCP visit completed due to CNs referral or appointment made N/A    Food Have Food Insecurities    Transportation Need transportation assistance    Housing/Utilities N/A    Interpersonal Safety N/A    Interventions Advocate/Support;Educate;Counsel    Abnormal to Normal Screening Since Last CN Visit N/A    Screenings CN Performed N/A    Sent Client to Lab for: N/A    Did client attend any of the following based off CNs referral or appointments made? N/A    ED Visit Averted N/A    Life-Saving Intervention Made N/A               Dept: [phone]   Congregational Nurse Program Note  Date of Encounter: 11/18/2022  Clinic visit to assist with Trulicity injection.  States AM blood glucose was 137, has not had any low glucose levels this week.  Reminded to eat regular meals and to prevent low blood glucose levels.  Assisted  with Trulicity injection in left lower quadrant of abdomen. Past Medical History: Past Medical History:  Diagnosis Date   Diabetes mellitus without complication (HCC)    DiGeorge syndrome (HCC)    Ectrodactyly    Genetic defect    genetic mutation of unknown significance per [facility] genetic notes   GERD (gastroesophageal reflux disease)    Hypercholesterolemia    Hypertension    Intellectual disability    Microcephaly (HCC)    Pelvic mass    Ventral hernia     Encounter Details:  CNP Questionnaire - 11/18/22 1150       Questionnaire   Ask client: Do you give verbal consent for me to treat you today? Yes    Student Assistance N/A    Location Patient Served  [facility]    Visit Setting with Client Organization    Patient Status Unknown   Has own apartment at [facility]    Insurance/Financial Assistance Referral N/A    Medication N/A    Medical Provider Yes    Screening Referrals Made N/A    Medical Referrals Made N/A    Medical Appointment Made N/A    Recently w/o PCP, now 1st time PCP visit completed due to CNs referral  or appointment made N/A    Food Have Food Insecurities    Transportation Need transportation assistance    Housing/Utilities N/A    Interpersonal Safety N/A    Interventions Advocate/Support;Educate;Counsel    Abnormal to Normal Screening Since Last CN Visit N/A    Screenings CN Performed N/A    Sent Client to Lab for: N/A    Did client attend any of the following based off CNs referral or appointments made? N/A    ED Visit Averted N/A    Life-Saving Intervention Made N/A

## 2022-11-25 NOTE — Congregational Nurse Program (Signed)
  Dept: [phone]   Congregational Nurse Program Note  Date of Encounter: 11/25/2022  Clinic visit to assist with Trulicity injection.  Reviewed blood glucose results and meals for past three days.  Recommended cooking meals at home rather than take out fast foods.  Educated regarding current COVID-19 cases at location where she has been going for lunches. Past Medical History: Past Medical History:  Diagnosis Date   Diabetes mellitus without complication (HCC)    DiGeorge syndrome (HCC)    Ectrodactyly    Genetic defect    genetic mutation of unknown significance per [facility] genetic notes   GERD (gastroesophageal reflux disease)    Hypercholesterolemia    Hypertension    Intellectual disability    Microcephaly (HCC)    Pelvic mass    Ventral hernia     Encounter Details:  CNP Questionnaire - 11/25/22 1724       Questionnaire   Ask client: Do you give verbal consent for me to treat you today? Yes    Student Assistance N/A    Location Patient [place]    Visit Setting with Client Organization    Patient Status Unknown   Has own apartment at [facility]    Insurance/Financial Assistance Referral N/A    Medication N/A    Medical Provider Yes    Screening Referrals Made N/A    Medical Referrals Made N/A    Medical Appointment Made N/A    Recently w/o PCP, now 1st time PCP visit completed due to CNs referral or appointment made N/A    Food Have Food Insecurities    Transportation Need transportation assistance    Housing/Utilities N/A    Interpersonal Safety N/A    Interventions Advocate/Support;Educate;Counsel;Case Management    Abnormal to Normal Screening Since Last CN Visit N/A    Screenings CN Performed N/A    Sent Client to Lab for: N/A    Did client attend any of the following based off CNs referral or appointments made? N/A    ED Visit Averted N/A    Life-Saving Intervention Made N/A

## 2022-12-02 NOTE — Congregational Nurse Program (Signed)
  Dept: [phone]   Congregational Nurse Program Note  Date of Encounter: 12/02/2022  Clinic visit to assist with Trulicity injection given in left lower quadrant of abdomen.  Discussed blood glucose levels this week and educated regarding need to eat regular meals and less takeout food.  Educated regarding keeping blood glucose levels with in a narrower range to prevent complications of diabetes. Past Medical History: Past Medical History:  Diagnosis Date   Diabetes mellitus without complication (HCC)    DiGeorge syndrome (HCC)    Ectrodactyly    Genetic defect    genetic mutation of unknown significance per [facility] genetic notes   GERD (gastroesophageal reflux disease)    Hypercholesterolemia    Hypertension    Intellectual disability    Microcephaly (HCC)    Pelvic mass    Ventral hernia     Encounter Details:  CNP Questionnaire - 12/02/22 1640       Questionnaire   Ask client: Do you give verbal consent for me to treat you today? Yes    Student Assistance N/A    Location Patient [place]    Visit Setting with Client Organization    Patient Status Unknown   Has own apartment at [facility]    Insurance/Financial Assistance Referral N/A    Medication N/A    Medical Provider Yes    Screening Referrals Made N/A    Medical Referrals Made N/A    Medical Appointment Made N/A    Recently w/o PCP, now 1st time PCP visit completed due to CNs referral or appointment made N/A    Food Have Food Insecurities    Transportation Need transportation assistance    Housing/Utilities N/A    Interpersonal Safety N/A    Interventions Advocate/Support;Educate;Counsel;Case Management    Abnormal to Normal Screening Since Last CN Visit N/A    Screenings CN Performed N/A    Sent Client to Lab for: N/A    Did client attend any of the following based off CNs referral or appointments made? N/A    ED Visit Averted N/A    Life-Saving  Intervention Made N/A

## 2022-12-11 ENCOUNTER — Encounter (HOSPITAL_BASED_OUTPATIENT_CLINIC_OR_DEPARTMENT_OTHER): Payer: Medicaid Other | Attending: General Surgery | Admitting: General Surgery

## 2022-12-11 DIAGNOSIS — L905 Scar conditions and fibrosis of skin: Secondary | ICD-10-CM | POA: Insufficient documentation

## 2022-12-11 NOTE — Progress Notes (Addendum)
[name], [name] ([id number]) [id number] Page 1 of 8 Visit Report for 12/11/2022 Chief Complaint Document Details Patient Name: Date of Service: [name] 12/11/2022 9:00 A M Medical Record Number: [id number] Patient Account Number: [id number] Date of Birth/Sex: Treating RN: 1975/05/25 (47 y.o. F) Primary Care Provider: Sedalia Muta, [place] TRICK Other Clinician: Referring Provider: Treating Provider/Extender: Lowella Curb, PA TRICK Weeks in Treatment: 0 Information Obtained from: Patient Chief Complaint Patient seen for complaints of Non-Healing Wound. 08/25/2022: non-wound concern 12/11/2022: non-wound concern Electronic Signature(s) Signed: 12/11/2022 9:25:44 AM By: Duanne Guess MD FACS Entered By: Duanne Guess on 12/11/2022 09:25:44 -------------------------------------------------------------------------------- HPI Details Patient Name: Date of Service: [name] 12/11/2022 9:00 A M Medical Record Number: [id number] Patient Account Number: [id number] Date of Birth/Sex: Treating RN: 1975-07-06 (47 y.o. F) Primary Care Provider: Sedalia Muta, [place] TRICK Other Clinician: Referring Provider: Treating Provider/Extender: Lowella Curb, PA TRICK Weeks in Treatment: 0 History of Present Illness HPI Description: ADMISSION 12/11/2021 History is somewhat limited secondary to intellectual delay. Some of the history has been garnered from review of the electronic medical record. The patient reports that she was sitting on pavement with her left foot tucked underneath her. She says that she developed a wound on that site. She saw her PCP. They note from that clinic date (11/13/2021: Says that the wound was open but dried without any drainage. Left foot edema was described. A 7-day course of Keflex was prescribed. She is followed by podiatry for routine diabetic foot care. When she saw them on August 15, the provider  referred her to a different podiatrist who debrided the wound. He applied Iodosorb and prescribed Silvadene daily dressing changes. The patient states that she has not been changing her dressing. She apparently resides in some sort of facility and she says there are nurses and doctors there. Specific details are difficult to sort out. She denies any significant pain to the wound. On her left lateral malleolus, there is a circular wound that does seem consistent with the history provided. There is some slough buildup in the center of the wound. No fluctuance, odor, or purulent drainage. 12/16/2021: The wound is about the same size today but the surface is quite a bit cleaner. 12/23/2021: The wound continues to contract. Has developed some undermining but this appears to be secondary to some perimeter eschar. No concern for infection. 12/30/2021: The area of undermining has broken down and she now has 3 small areas that are open. They all connect under the thin bands of skin. Her drainage has decreased. There is a little bit of slough and eschar accumulation. 01/06/2022: The wound looks a bit shallower today; I removed the skin that was causing the undermining at her last visit with me. There is a little bit of slough overlying good granulation tissue at the more distal aspect of the wound. The more proximal aspect is a bit pale and fibrous. 01/14/2022: Her wound has healed. 01/29/2022: The patient returns to clinic today because she was concerned that her wound had reopened. On intake, however, it was noted that she had a thick [name], [name] ([id number]) [id number] Page 2 of 8 buildup of Iodosorb on her skin; apparently she had been applying it despite her wound being healed. There is no open wound today. CONSULTATION ONLY 08/25/2022: The patient came in today because she was concerned about her ankle, where she had her previous wound. She has some scar in the area,  but  no open lesion. CONSULTATION ONLY 12/11/2022: She returned again today to have me check the scar on her left lateral malleolus. There is no open wound. Electronic Signature(s) Signed: 12/11/2022 9:26:40 AM By: Duanne Guess MD FACS Entered By: Duanne Guess on 12/11/2022 09:26:39 -------------------------------------------------------------------------------- Physical Exam Details Patient Name: Date of Service: [name] 12/11/2022 9:00 A M Medical Record Number: [id number] Patient Account Number: [id number] Date of Birth/Sex: Treating RN: 22-Dec-1975 (47 y.o. F) Primary Care Provider: Sedalia Muta, [place] TRICK Other Clinician: Referring Provider: Treating Provider/Extender: Lowella Curb, PA TRICK Weeks in Treatment: 0 Constitutional . . . . No acute distress. Respiratory Normal work of breathing on room air. Notes No open wounds Electronic Signature(s) Signed: 12/11/2022 9:27:03 AM By: Duanne Guess MD FACS Entered By: Duanne Guess on 12/11/2022 09:27:03 -------------------------------------------------------------------------------- Physician Orders Details Patient Name: Date of Service: [name] 12/11/2022 9:00 A M Medical Record Number: [id number] Patient Account Number: [id number] Date of Birth/Sex: Treating RN: 11/23/1975 (47 y.o. Tommye Standard Primary Care Provider: Sedalia Muta, [place] TRICK Other Clinician: Referring Provider: Treating Provider/Extender: Lowella Curb, PA TRICK Weeks in Treatment: 0 Verbal / Phone Orders: No Diagnosis Coding ICD-10 Coding Code Description Z87.2 Personal history of diseases of the skin and subcutaneous tissue Discharge From [facility] Services Discharge from Wound Care Center Edema Control - Lymphedema / SCD / Other Moisturize legs daily. [name], [name] ([id number]) [id number] Page 3 of 8 Electronic Signature(s) Signed: 12/11/2022 9:27:12 AM By:  Duanne Guess MD FACS Entered By: Duanne Guess on 12/11/2022 09:27:12 -------------------------------------------------------------------------------- Problem List Details Patient Name: Date of Service: [name] [facility] [name] 12/11/2022 9:00 A M Medical Record Number: [id number] Patient Account Number: [id number] Date of Birth/Sex: Treating RN: 06/09/1975 (47 y.o. F) Primary Care Provider: Sedalia Muta, [place] TRICK Other Clinician: Referring Provider: Treating Provider/Extender: Lowella Curb, PA TRICK Weeks in Treatment: 0 Active Problems ICD-10 Encounter Code Description Active Date MDM Diagnosis Z87.2 Personal history of diseases of the skin and subcutaneous tissue 12/11/2022 No Yes Inactive Problems Resolved Problems Electronic Signature(s) Signed: 12/11/2022 9:25:20 AM By: Duanne Guess MD FACS Entered By: Duanne Guess on 12/11/2022 09:25:20 -------------------------------------------------------------------------------- Progress Note Details Patient Name: Date of Service: [name] 12/11/2022 9:00 A M Medical Record Number: [id number] Patient Account Number: [id number] Date of Birth/Sex: Treating RN: 1975/08/17 (47 y.o. F) Primary Care Provider: Sedalia Muta, [place] TRICK Other Clinician: Referring Provider: Treating Provider/Extender: Lowella Curb, PA TRICK Weeks in Treatment: 0 Subjective Chief Complaint Information obtained from Patient Patient seen for complaints of Non-Healing Wound. 08/25/2022: non-wound concern 12/11/2022: non-wound concern History of Present Illness (HPI) ADMISSION 12/11/2021 History is somewhat limited secondary to intellectual delay. Some of the history has been garnered from review of the electronic medical record. The patient reports that she was sitting on pavement with her left foot tucked underneath her. She says that she developed a wound on that site. She saw her PCP. They note from that clinic date  (11/13/2021: Says that the wound was open but dried without any drainage. Left foot edema was described. A 7-day course of Keflex was prescribed. She is followed by podiatry for routine diabetic foot care. When she saw them on August 15, the provider referred her to a different podiatrist who debrided the wound. He applied Iodosorb and prescribed Silvadene daily dressing changes. The patient states that she has not been changing her dressing.  She apparently resides in some sort of facility and she says there are nurses and doctors there. Specific details are difficult to sort out. She denies any significant pain to the wound. On her left lateral malleolus, there is a circular wound that does seem consistent with the history provided. There is some slough buildup in the center of the wound. No fluctuance, odor, or purulent drainage. [name], [name] ([id number]) [id number] Page 4 of 8 12/16/2021: The wound is about the same size today but the surface is quite a bit cleaner. 12/23/2021: The wound continues to contract. Has developed some undermining but this appears to be secondary to some perimeter eschar. No concern for infection. 12/30/2021: The area of undermining has broken down and she now has 3 small areas that are open. They all connect under the thin bands of skin. Her drainage has decreased. There is a little bit of slough and eschar accumulation. 01/06/2022: The wound looks a bit shallower today; I removed the skin that was causing the undermining at her last visit with me. There is a little bit of slough overlying good granulation tissue at the more distal aspect of the wound. The more proximal aspect is a bit pale and fibrous. 01/14/2022: Her wound has healed. 01/29/2022: The patient returns to clinic today because she was concerned that her wound had reopened. On intake, however, it was noted that she had a thick buildup of Iodosorb on her skin; apparently she  had been applying it despite her wound being healed. There is no open wound today. CONSULTATION ONLY 08/25/2022: The patient came in today because she was concerned about her ankle, where she had her previous wound. She has some scar in the area, but no open lesion. CONSULTATION ONLY 12/11/2022: She returned again today to have me check the scar on her left lateral malleolus. There is no open wound. Patient History Unable to Obtain Patient History due to Altered Mental Status. Information obtained from Patient. Allergies No Known Allergies Family History Diabetes - Mother,Father, Stroke - Siblings. Social History Never smoker, Marital Status - Single, Alcohol Use - Never, Drug Use - No History, Caffeine Use - Daily. Medical History Hematologic/Lymphatic Patient has history of Anemia Cardiovascular Patient has history of Hypotension Denies history of Hypertension Endocrine Patient has history of Type II Diabetes Denies history of Type I Diabetes Genitourinary Denies history of End Stage Renal Disease Integumentary (Skin) Denies history of History of Burn Neurologic Patient has history of Neuropathy Oncologic Denies history of Received Chemotherapy, Received Radiation Psychiatric Denies history of Anorexia/bulimia, Confinement Anxiety Hospitalization/Surgery History - Cholecystectomy. - pelvic mass. - ventral hernia/ hernia repair. - Hand surgery (right hand). 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 Eyes Complains or has symptoms of Glasses / Contacts. Ear/Nose/Mouth/Throat Denies complaints or symptoms of Chronic sinus problems or rhinitis. Respiratory Denies complaints  or symptoms of Chronic or frequent coughs, Shortness of Breath. Cardiovascular Denies complaints or symptoms of Chest pain. Gastrointestinal Denies complaints or symptoms of Frequent diarrhea, Nausea, Vomiting. Endocrine Denies complaints or symptoms of Heat/cold intolerance. Genitourinary Denies complaints or symptoms of Frequent urination. Integumentary (Skin) [name], [name] ([id number]) [id number] Page 5 of 8 Denies complaints or symptoms of Wounds. Musculoskeletal Denies complaints or symptoms of Muscle Pain, Muscle Weakness. Neurologic Complains or has symptoms of Numbness/parasthesias. Psychiatric Denies  complaints or symptoms of Claustrophobia. Objective Constitutional No acute distress. Vitals Time Taken: 8:55 AM, Height: 58 in, Weight: 98 lbs, BMI: 20.5, Temperature: 98.8 F, Pulse: 96 bpm, Respiratory Rate: 18 breaths/min, Blood Pressure: 122/80 mmHg, Capillary Blood Glucose: 115 mg/dl. General Notes: glucose per pt report this am Respiratory Normal work of breathing on room air. General Notes: No open wounds Assessment Active Problems ICD-10 Personal history of diseases of the skin and subcutaneous tissue Plan Discharge From [facility] Services: Discharge from Wound Care Center Edema Control - Lymphedema / SCD / Other: Moisturize legs daily. CONSULTATION ONLY 12/11/2022: She returned again today to have me check the scar on her left lateral malleolus. There is no open wound. I provided reassurance to her that her wound remained closed and that she did not currently need the services of the wound care center. She may return in the future if indicated. Electronic Signature(s) Signed: 12/11/2022 9:27:48 AM By: Duanne Guess MD FACS Entered By: Duanne Guess on 12/11/2022 09:27:48 -------------------------------------------------------------------------------- HxROS Details Patient Name: Date of Service: Tina [name] 12/11/2022  9:00 A M Medical Record Number: [id number] Patient Account Number: [id number] Date of Birth/Sex: Treating RN: 1975/10/06 (47 y.o. Tommye Standard Primary Care Provider: Sedalia Muta, [place] TRICK Other Clinician: Referring Provider: Treating Provider/Extender: Lowella Curb, PA TRICK Weeks in TreatmentGUSTA, [name] ([id number]) [id number] Page 6 of 8 Unable to Obtain Patient History due to Altered Mental Status Information Obtained From Patient Constitutional Symptoms (General Health) Complaints and Symptoms: Negative for: Fatigue; Fever; Chills; Marked Weight Change Eyes Complaints and Symptoms: Positive for: Glasses / Contacts Ear/Nose/Mouth/Throat Complaints and Symptoms: Negative for: Chronic sinus problems or rhinitis Respiratory Complaints and Symptoms: Negative for: Chronic or frequent coughs; Shortness of Breath Cardiovascular Complaints and Symptoms: Negative for: Chest pain Medical History: Positive for: Hypotension Negative for: Hypertension Gastrointestinal Complaints and Symptoms: Negative for: Frequent diarrhea; Nausea; Vomiting Medical History: Past Medical History Notes: GERD Endocrine Complaints and Symptoms: Negative for: Heat/cold intolerance Medical History: Positive for: Type II Diabetes Negative for: Type I Diabetes Past Medical History Notes: DKA ovarian mass Time with diabetes: since 2007 Treated with: Insulin, Oral agents Blood sugar tested every day: Yes Tested : 2 Genitourinary Complaints and Symptoms: Negative for: Frequent urination Medical History: Negative for: End Stage Renal Disease Integumentary (Skin) Complaints and Symptoms: Negative for: Wounds Medical History: Negative for: History of Burn Musculoskeletal Complaints and Symptoms: Negative for: Muscle Pain; Muscle Weakness Medical History: Past Medical History NotesSAOIRSE, [name] ([id number])  [id number] Page 7 of 8 ectrodactyly of both hands Neurologic Complaints and Symptoms: Positive for: Numbness/parasthesias Medical History: Positive for: Neuropathy Past Medical History Notes: intellectual disablity Psychiatric Complaints and Symptoms: Negative for: Claustrophobia Medical History: Negative for: Anorexia/bulimia; Confinement Anxiety Past Medical History Notes: intellectual disability Hematologic/Lymphatic Medical History: Positive for: Anemia Immunological Oncologic Medical History: Negative for: Received Chemotherapy; Received Radiation Immunizations Pneumococcal Vaccine: Received Pneumococcal Vaccination: Yes Received Pneumococcal Vaccination On or After 60th Birthday: No Implantable Devices No devices added Hospitalization / Surgery History Type of Hospitalization/Surgery Cholecystectomy pelvic mass ventral hernia/ hernia repair Hand surgery (right hand) Family and Social History Diabetes: Yes - Mother,Father; Stroke: Yes - Siblings; Never smoker; Marital Status - Single; Alcohol Use: Never; Drug Use: No History; Caffeine Use: Daily; Financial Concerns: No; Food, Clothing or Shelter Needs: No; Transportation Concerns: No Psychologist, prison and probation services) Signed: 12/11/2022 10:28:43 AM By: Duanne Guess MD FACS Signed: 12/11/2022 4:53:04 PM By: Zenaida Deed RN, BSN Entered By: Zenaida Deed on  12/11/2022 09:03:51 -------------------------------------------------------------------------------- SuperBill Details Patient Name: Date of Service: [name] 12/11/2022 Medical Record Number: [id number] Patient Account Number: [id number] Date of Birth/Sex: Treating RN: [birth year] (47 y.o. F) Primary Care Provider: Sedalia Muta, [place] TRICK Other Clinician: Referring Provider: Treating Provider/Extender: Lowella Curb, PA TRICK Weeks in TreatmentCOURTNI, [name] ([id number])  [id number] Page 8 of 8 Diagnosis Coding ICD-10 Codes Code Description Z87.2 Personal history of diseases of the skin and subcutaneous tissue Facility Procedures : CPT4 Code: 16073710 Description: 99213 - WOUND CARE VISIT-LEV 3 EST PT Modifier: Quantity: 1 Physician Procedures : CPT4 Code Description Modifier [id number] 99213 - WC PHYS LEVEL 3 - EST PT ICD-10 Diagnosis Description Z87.2 Personal history of diseases of the skin and subcutaneous tissue Quantity: 1 Electronic Signature(s) Signed: 01/06/2023 8:38:20 AM By: Pearletha Alfred Signed: 01/06/2023 3:13:40 PM By: Duanne Guess MD FACS Previous Signature: 12/11/2022 9:27:59 AM Version By: Duanne Guess MD FACS Entered By: Pearletha Alfred on 01/06/2023 08:38:20

## 2022-12-11 NOTE — Progress Notes (Signed)
CARLEENE, CLAR ([id number]) [phone].pdf Page 1 of 4 Visit Report for 12/11/2022 Abuse Risk Screen Details Patient Name: Date of Service: [name] 12/11/2022 9:00 A M Medical Record Number: [id number] Patient Account Number: [id number] Date of Birth/Sex: Treating RN: [birth year] (47 y.o. [name] Primary Care [name]: [name], [place] TRICK Other Clinician: Referring Phineas Mcenroe: Treating Bahar Shelden/Extender: Tina Curb, PA TRICK Weeks in Treatment: 0 Abuse Risk Screen Items Answer ABUSE RISK SCREEN: Has anyone close to you tried to hurt or harm you recentlyo No Do you feel uncomfortable with anyone in your familyo No Has anyone forced you do things that you didnt want to doo No Electronic Signature(s) Signed: 12/11/2022 4:53:04 PM By: Tina Deed RN, BSN Entered By: [name] on 12/11/2022 06:03:59 -------------------------------------------------------------------------------- Activities of Daily Living Details Patient Name: Date of Service: [name] 12/11/2022 9:00 A M Medical Record Number: [id number] Patient Account Number: [id number] Date of Birth/Sex: Treating RN: 12-09-1975 (47 y.o. [name] Primary Care Diannie Willner: [name], [place] TRICK Other Clinician: Referring Phinneas Shakoor: Treating Cayton Cuevas/Extender: Tina Curb, PA TRICK Weeks in Treatment: 0 Activities of Daily Living Items Answer Activities of Daily Living (Please select one for each item) Drive Automobile Not Able T Medications ake Need Assistance Use T elephone Completely Able Care for Appearance Completely Able Use T oilet Completely Able Bath / Shower Completely Able Dress Self Completely Able Feed Self Completely Able Walk Completely Able Get In / Out Bed Completely Able Housework Completely Able Prepare Meals Completely Able Handle Money Completely Able Shop for Self Completely  Able Electronic Signature(s) Signed: 12/11/2022 4:53:04 PM By: Tina Deed RN, BSN Entered By: [name] on 12/11/2022 06:04:46 [name] ([id number]) [id number] Page 2 of 4 -------------------------------------------------------------------------------- Education Screening Details Patient Name: Date of Service: [name] 12/11/2022 9:00 A M Medical Record Number: [id number] Patient Account Number: [id number] Date of Birth/Sex: Treating RN: 1975-12-05 (47 y.o. [name] Primary Care Neri Vieyra: [name], [place] TRICK Other Clinician: Referring Crystin Lechtenberg: Treating Dailyn Kempner/Extender: Tina Curb, PA TRICK Weeks in Treatment: 0 Learning Preferences/Education Level/Primary Language Learning Preference: Explanation, Demonstration Highest Education Level: High School Preferred Language: English Cognitive Barrier Language Barrier: No Translator Needed: No Memory Deficit: No Emotional Barrier: No Cultural/Religious Beliefs Affecting Medical Care: No Physical Barrier Impaired Vision: Yes Glasses Impaired Hearing: No Decreased Hand dexterity: Yes Knowledge/Comprehension Knowledge Level: High Comprehension Level: High Ability to understand written instructions: High Ability to understand verbal instructions: High Motivation Anxiety Level: Calm Cooperation: Cooperative Education Importance: Acknowledges Need Interest in Health Problems: Asks Questions Perception: Coherent Willingness to Engage in Self-Management High Activities: Readiness to Engage in Self-Management High Activities: Electronic Signature(s) Signed: 12/11/2022 4:53:04 PM By: Tina Deed RN, BSN Entered By: [name] on 12/11/2022 06:05:24 -------------------------------------------------------------------------------- Fall Risk Assessment Details Patient Name: Date of Service: [name] 12/11/2022 9:00 A  M Medical Record Number: [id number] Patient Account Number: [id number] Date of Birth/Sex: Treating RN: 31-Oct-1975 (47 y.o. [name] Primary Care Niaja Stickley: [name], [place] TRICK Other Clinician: Referring Daisia Slomski: Treating Ardelle Haliburton/Extender: Tina Curb, PA TRICK Weeks in Treatment: 0 Fall Risk Assessment Items Have you had 2 or more falls in the last 12 monthso 0 No Have you had any fall that resulted in injury in the last 12 monthso 0 No [name], [name] ([id number]) [id number] Page 3 of 4 FALLS RISK SCREEN History of  falling - immediate or within 3 months 25 Yes Secondary diagnosis (Do you have 2 or more medical diagnoseso) 0 No Ambulatory aid None/bed rest/wheelchair/nurse 0 Yes Crutches/cane/walker 0 No Furniture 0 No Intravenous therapy Access/Saline/Heparin Lock 0 No Gait/Transferring Normal/ bed rest/ wheelchair 0 Yes Weak (short steps with or without shuffle, stooped but able to lift head while walking, may seek 0 No support from furniture) Impaired (short steps with shuffle, may have difficulty arising from chair, head down, impaired 0 No balance) Mental Status Oriented to own ability 0 Yes Electronic Signature(s) Signed: 12/11/2022 4:53:04 PM By: Tina Deed RN, BSN Entered By: [name] on 12/11/2022 06:06:17 -------------------------------------------------------------------------------- Foot Assessment Details Patient Name: Date of Service: [name] 12/11/2022 9:00 A M Medical Record Number: [id number] Patient Account Number: [id number] Date of Birth/Sex: Treating RN: 1975/09/25 (47 y.o. [name] Primary Care Lenwood Balsam: [name], [place] TRICK Other Clinician: Referring [name]: Treating [name]/Extender: Tina Curb, PA TRICK Weeks in Treatment: 0 Foot Assessment Items Site Locations + = Sensation present, - = Sensation absent, C = Callus, U = Ulcer R  = Redness, W = Warmth, M = Maceration, PU = Pre-ulcerative lesion F = Fissure, S = Swelling, D = Dryness Assessment Right: Left: Other Deformity: No No Prior Foot Ulcer: No No Prior Amputation: No No Charcot Joint: No No Ambulatory Status: Ambulatory Without Help GaitMILLI, [name] ([id number]) [id number] Page 4 of 4 Electronic Signature(s) Signed: 12/11/2022 4:53:04 PM By: Tina Deed RN, BSN Entered By: [name] on 12/11/2022 06:07:08 -------------------------------------------------------------------------------- Nutrition Risk Screening Details Patient Name: Date of Service: [name] 12/11/2022 9:00 A M Medical Record Number: [id number] Patient Account Number: [id number] Date of Birth/Sex: Treating RN: [birth year] (47 y.o. [name] Primary Care Stachia Slutsky: [name], [place] TRICK Other Clinician: Referring Ziyan Schoon: Treating Hulon Ferron/Extender: Tina Curb, PA TRICK Weeks in Treatment: 0 Height (in): 58 Weight (lbs): 98 Body Mass Index (BMI): 20.5 Nutrition Risk Screening Items Score Screening NUTRITION RISK SCREEN: I have an illness or condition that made me change the kind and/or amount of food I eat 0 No I eat fewer than two meals per day 0 No I eat few fruits and vegetables, or milk products 0 No I have three or more drinks of beer, liquor or wine almost every day 0 No I have tooth or mouth problems that make it hard for me to eat 0 No I don't always have enough money to buy the food I need 0 No I eat alone most of the time 0 No I take three or more different prescribed or over-the-counter drugs a day 1 Yes Without wanting to, I have lost or gained 10 pounds in the last six months 0 No I am not always physically able to shop, cook and/or feed myself 0 No Nutrition Protocols Good Risk Protocol 0 No interventions needed Moderate Risk Protocol High Risk Proctocol Risk Level: Good  Risk Score: 1 Electronic Signature(s) Signed: 12/11/2022 4:53:04 PM By: Tina Deed RN, BSN Entered By: [name] on 12/11/2022 06:06:54

## 2022-12-11 NOTE — Progress Notes (Signed)
Tina, Church ([id number]) [id number] Page 1 of 6 Visit Report for 12/11/2022 Allergy List Details Patient Name: Date of Service: [name] 12/11/2022 9:00 A M Medical Record Number: [id number] Patient Account Number: [id number] Date of Birth/Sex: Treating RN: [birth year] (47 y.o. [name] Primary Care Chaz Mcglasson: [place] Muta, [place] TRICK Other Clinician: Referring Mckaylah Bettendorf: Treating Shantera Monts/Extender: Lowella Curb, PA TRICK Weeks in Treatment: 0 Allergies Active Allergies No Known Allergies Allergy Notes Electronic Signature(s) Signed: 12/11/2022 4:53:04 PM By: Zenaida Deed RN, BSN Entered By: Zenaida Deed on 12/11/2022 06:00:31 -------------------------------------------------------------------------------- Arrival Information Details Patient Name: Date of Service: [name] 12/11/2022 9:00 A M Medical Record Number: [id number] Patient Account Number: [id number] Date of Birth/Sex: Treating RN: February 25, 1976 (47 y.o. F) Primary Care Ivah Girardot: [place] Muta, [place] TRICK Other Clinician: Referring Cannon Arreola: Treating Nabila Albarracin/Extender: Lowella Curb, PA TRICK Weeks in Treatment: 0 Visit Information Patient Arrived: Ambulatory Arrival Time: 08:54 Accompanied By: self Transfer Assistance: None Patient Identification Verified: Yes Secondary Verification Process Completed: Yes Patient Requires Transmission-Based Precautions: No Patient Has Alerts: No History Since Last Visit All ordered tests and consults were completed: No Added or deleted any medications: No Any new allergies or adverse reactions: No Had a fall or experienced change in activities of daily living that may affect risk of falls: No Signs or symptoms of abuse/neglect since last visito No Hospitalized since last visit: No Implantable device outside of the clinic excluding cellular tissue based products placed in the center since last  visit: No Electronic Signature(s) Signed: 12/11/2022 4:53:04 PM By: Zenaida Deed RN, BSN Entered By: Zenaida Deed on 12/11/2022 05:59:49 Annia Belt ([id number]) [id number] Page 2 of 6 -------------------------------------------------------------------------------- Clinic Level of Care Assessment Details Patient Name: Date of Service: [name] 12/11/2022 9:00 A M Medical Record Number: [id number] Patient Account Number: [id number] Date of Birth/Sex: Treating RN: [birth year] (47 y.o. [name] Primary Care Maceo Hernan: [place] Muta, [place] TRICK Other Clinician: Referring Jacksen Isip: Treating Tranquilino Fischler/Extender: Lowella Curb, PA TRICK Weeks in Treatment: 0 Clinic Level of Care Assessment Items TOOL 2 Quantity Score []  - 0 Use when only an EandM is performed on the INITIAL visit ASSESSMENTS - Nursing Assessment / Reassessment X- 1 20 General Physical Exam (combine w/ comprehensive assessment (listed just below) when performed on new pt. evals) X- 1 25 Comprehensive Assessment (HX, ROS, Risk Assessments, Wounds Hx, etc.) ASSESSMENTS - Wound and Skin A ssessment / Reassessment X - Simple Wound Assessment / Reassessment - one wound 1 5 []  - 0 Complex Wound Assessment / Reassessment - multiple wounds []  - 0 Dermatologic / Skin Assessment (not related to wound area) ASSESSMENTS - Ostomy and/or Continence Assessment and Care []  - 0 Incontinence Assessment and Management []  - 0 Ostomy Care Assessment and Management (repouching, etc.) PROCESS - Coordination of Care X - Simple Patient / Family Education for ongoing care 1 15 []  - 0 Complex (extensive) Patient / Family Education for ongoing care X- 1 10 Staff obtains Chiropractor, Records, T Results / Process Orders est []  - 0 Staff telephones HHA, Nursing Homes / Clarify orders / etc []  - 0 Routine Transfer to another Facility (non-emergent condition) []  - 0 Routine  Hospital Admission (non-emergent condition) X- 1 15 New Admissions / Manufacturing engineer / Ordering NPWT Apligraf, etc. , []  - 0 Emergency Hospital Admission (emergent condition) X- 1 10 Simple Discharge Coordination []  - 0 Complex (extensive) Discharge Coordination PROCESS -  Special Needs []  - 0 Pediatric / Minor Patient Management []  - 0 Isolation Patient Management []  - 0 Hearing / Language / Visual special needs []  - 0 Assessment of Community assistance (transportation, D/C planning, etc.) []  - 0 Additional assistance / Altered mentation []  - 0 Support Surface(s) Assessment (bed, cushion, seat, etc.) INTERVENTIONS - Wound Cleansing / Measurement []  - 0 Wound Imaging (photographs - any number of wounds) []  - 0 Wound Tracing (instead of photographs) []  - 0 Simple Wound Measurement - one wound []  - 0 Complex Wound Measurement - multiple wounds []  - 0 Simple Wound Cleansing - one wound []  - 0 Complex Wound Cleansing - multiple wounds INTERVENTIONS - Wound Dressings []  - 0 Small Wound Dressing one or multiple wounds MONSERRAT, PAVEK ([id number]) [id number] Page 3 of 6 []  - 0 Medium Wound Dressing one or multiple wounds []  - 0 Large Wound Dressing one or multiple wounds []  - 0 Application of Medications - injection INTERVENTIONS - Miscellaneous []  - 0 External ear exam []  - 0 Specimen Collection (cultures, biopsies, blood, body fluids, etc.) []  - 0 Specimen(s) / Culture(s) sent or taken to Lab for analysis []  - 0 Patient Transfer (multiple staff / Nurse, adult / Similar devices) []  - 0 Simple Staple / Suture removal (25 or less) []  - 0 Complex Staple / Suture removal (26 or more) []  - 0 Hypo / Hyperglycemic Management (close monitor of Blood Glucose) []  - 0 Ankle / Brachial Index (ABI) - do not check if billed separately Has the patient been seen at the hospital within the last three years: Yes Total Score: 100 Level Of  Care: New/Established - Level 3 Electronic Signature(s) Signed: 12/11/2022 4:53:04 PM By: Zenaida Deed RN, BSN Entered By: Zenaida Deed on 12/11/2022 06:19:38 -------------------------------------------------------------------------------- Encounter Discharge Information Details Patient Name: Date of Service: [name] 12/11/2022 9:00 A M Medical Record Number: [id number] Patient Account Number: [id number] Date of Birth/Sex: Treating RN: [birth year] (47 y.o. [name] Primary Care Devaeh Amadi: [place] Muta, [place] TRICK Other Clinician: Referring Jovanni Rash: Treating Devion Chriscoe/Extender: Lowella Curb, PA TRICK Weeks in Treatment: 0 Encounter Discharge Information Items Discharge Condition: Stable Ambulatory Status: Ambulatory Discharge Destination: Home Transportation: Other Accompanied By: self Schedule Follow-up Appointment: Yes Clinical Summary of Care: Patient Declined Notes transportation service Electronic Signature(s) Signed: 12/11/2022 4:53:04 PM By: Zenaida Deed RN, BSN Entered By: Zenaida Deed on 12/11/2022 06:20:25 -------------------------------------------------------------------------------- Lower Extremity Assessment Details Patient Name: Date of Service: TRO [facility] Church 12/11/2022 9:00 A M Medical Record Number: [id number] Patient Account Number: [id number] Date of Birth/Sex: Treating RN: 10/17/1975 (47 y.o. [name] [place], [place] ([id number]) [id number] Page 4 of 6 Primary Care Rosaire Cueto: [place] Muta, [place] TRICK Other Clinician: Referring Kinesha Auten: Treating Liese Dizdarevic/Extender: Lowella Curb, PA TRICK Weeks in Treatment: 0 Edema Assessment Assessed: [Left: No] [Right: No] Edema: [Left: N] [Right: o] Vascular Assessment Pulses: Dorsalis Pedis Palpable: [Left:Yes] Extremity colors, hair growth, and conditions: Extremity Color: [Left:Normal] Temperature of Extremity:  [Left:Warm < 3 seconds] Electronic Signature(s) Signed: 12/11/2022 4:53:04 PM By: Zenaida Deed RN, BSN Entered By: Zenaida Deed on 12/11/2022 06:08:41 -------------------------------------------------------------------------------- Multi Wound Chart Details Patient Name: Date of Service: [name] 12/11/2022 9:00 A M Medical Record Number: [id number] Patient Account Number: [id number] Date of Birth/Sex: Treating RN: 1975-10-12 (47 y.o. F) Primary Care Lakeena Downie: [place] Muta, [place] TRICK Other Clinician: Referring Cielle Aguila: Treating Patrick Sohm/Extender: Lowella Curb, PA TRICK Weeks in  Treatment: 0 Vital Signs Height(in): 58 Capillary Blood Glucose(mg/dl): 960 Weight(lbs): 98 Pulse(bpm): 96 Body Mass Index(BMI): 20.5 Blood Pressure(mmHg): 122/80 Temperature(F): 98.8 Respiratory Rate(breaths/min): 18 [Treatment Notes:Wound Assessments Treatment Notes] Electronic Signature(s) Signed: 12/11/2022 9:25:23 AM By: Duanne Guess MD FACS Entered By: Duanne Guess on 12/11/2022 06:25:23 -------------------------------------------------------------------------------- Pain Assessment Details Patient Name: Date of Service: [name] 12/11/2022 9:00 A M Medical Record Number: [id number] Patient Account Number: [id number] Date of Birth/Sex: Treating RN: 04-27-1975 (47 y.o. [name] Primary Care Miyah Hampshire: [place] Muta, [place] TRICK Other Clinician: Referring Abbagale Goguen: Treating Keyton Bhat/Extender: Lowella Curb, PA TRICK Weeks in Treatment: [address] JARIANA, DECARVALHO ([id number]) [id number] Page 5 of 6 Location of Pain Severity and Description of Pain Patient Has Paino No Site Locations Rate the pain. Current Pain Level: 0 Pain Management and Medication Current Pain Management: Electronic Signature(s) Signed: 12/11/2022 4:53:04 PM By: Zenaida Deed RN, BSN Entered By: Zenaida Deed on  12/11/2022 06:07:43 -------------------------------------------------------------------------------- Patient/Caregiver Education Details Patient Name: Date of Service: [name] 9/5/2024andnbsp9:00 A M Medical Record Number: [id number] Patient Account Number: [id number] Date of Birth/Gender: Treating RN: 10-31-1975 (47 y.o. [name] Primary Care Physician: [place] Muta, [place] TRICK Other Clinician: Referring Physician: Treating Physician/Extender: Lowella Curb, PA TRICK Weeks in Treatment: 0 Education Assessment Education Provided To: Patient Education Topics Provided Wound/Skin Impairment: Methods: Explain/Verbal Responses: Reinforcements needed, State content correctly Electronic Signature(s) Signed: 12/11/2022 4:53:04 PM By: Zenaida Deed RN, BSN Entered By: Zenaida Deed on 12/11/2022 06:18:55 Vitals Details -------------------------------------------------------------------------------- Annia Belt ([id number]) [id number] Page 6 of 6 Patient Name: Date of Service: [name] 12/11/2022 9:00 A M Medical Record Number: [id number] Patient Account Number: [id number] Date of Birth/Sex: Treating RN: 02/12/1976 (47 y.o. F) Primary Care Zettie Gootee: [place] Muta, [place] TRICK Other Clinician: Referring Afnan Emberton: Treating Leni Pankonin/Extender: Lowella Curb, PA TRICK Weeks in Treatment: 0 Vital Signs Time Taken: 08:55 Temperature (F): 98.8 Height (in): 58 Pulse (bpm): 96 Weight (lbs): 98 Respiratory Rate (breaths/min): 18 Body Mass Index (BMI): 20.5 Blood Pressure (mmHg): 122/80 Capillary Blood Glucose (mg/dl): 884 Reference Range: 80 - 120 mg / dl Notes glucose per pt report this am Electronic Signature(s) Signed: 12/11/2022 4:53:04 PM By: Zenaida Deed RN, BSN Entered By: Zenaida Deed on 12/11/2022 06:01:03

## 2022-12-16 LAB — BASIC METABOLIC PANEL: Glucose: 194

## 2022-12-16 NOTE — Congregational Nurse Program (Signed)
  Dept: [phone]   Congregational Nurse Program Note  Date of Encounter: 12/16/2022  Clinic visit to assist with Trulicity injection.  Stated that this morning blood glucose was 61, checked at visit and was 194.  Reviewed meals for today, educated regarding maintaining blood glucose at narrow range to prevent complications of diabetes.  Trulicity injection given left lower quadrant of abdomen with assistance. Past Medical History: Past Medical History:  Diagnosis Date   Diabetes mellitus without complication (HCC)    DiGeorge syndrome (HCC)    Ectrodactyly    Genetic defect    genetic mutation of unknown significance per [facility] genetic notes   GERD (gastroesophageal reflux disease)    Hypercholesterolemia    Hypertension    Intellectual disability    Microcephaly (HCC)    Pelvic mass    Ventral hernia     Encounter Details:  CNP Questionnaire - 12/16/22 1730       Questionnaire   Ask client: Do you give verbal consent for me to treat you today? Yes    Student Assistance N/A    Location [place]    Visit Setting with Client Organization    Patient Status Unknown   Has own apartment at [facility]    Insurance/Financial Assistance Referral N/A    Medication N/A    Medical Provider Yes    Screening Referrals Made N/A    Medical Referrals Made N/A    Medical Appointment Made N/A    Recently w/o PCP, now 1st time PCP visit completed due to CNs referral or appointment made N/A    Food Have Food Insecurities    Transportation Need transportation assistance    Housing/Utilities N/A    Interpersonal Safety N/A    Interventions Advocate/Support;Counsel;Educate;Case Management    Abnormal to Normal Screening Since Last CN Visit N/A    Screenings CN Performed Blood Pressure;Weight;Blood Glucose;Pulse Ox    Sent Client to Lab for: N/A    Did client attend any of the following based off CNs referral or appointments made?  N/A    ED Visit Averted N/A    Life-Saving Intervention Made N/A

## 2022-12-16 NOTE — Congregational Nurse Program (Signed)
  Dept: [phone]   Congregational Nurse Program Note  Date of Encounter: 12/16/2022  Clinic visit to assist with Trulicity injection.  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  Assisted with taking the injection in lower left quadrant of abdomen. Past Medical History: Past Medical History:  Diagnosis Date   Diabetes mellitus without complication (HCC)    DiGeorge syndrome (HCC)    Ectrodactyly    Genetic defect    genetic mutation of unknown significance per [facility] genetic notes   GERD (gastroesophageal reflux disease)    Hypercholesterolemia    Hypertension    Intellectual disability    Microcephaly (HCC)    Pelvic mass    Ventral hernia     Encounter Details:  CNP Questionnaire - 12/09/22 1722       Questionnaire   Ask client: Do you give verbal consent for me to treat you today? Yes    Student Assistance N/A    Location Patient [place]    Visit Setting with Client Organization    Patient Status Unknown   Has own apartment at [facility]    Insurance/Financial Assistance Referral N/A    Medication N/A    Medical Provider Yes    Screening Referrals Made N/A    Medical Referrals Made N/A    Medical Appointment Made N/A    Recently w/o PCP, now 1st time PCP visit completed due to CNs referral or appointment made N/A    Food Have Food Insecurities    Transportation Need transportation assistance    Housing/Utilities N/A    Interpersonal Safety N/A    Interventions Advocate/Support;Counsel    Abnormal to Normal Screening Since Last CN Visit N/A    Screenings CN Performed N/A    Sent Client to Lab for: N/A    Did client attend any of the following based off CNs referral or appointments made? N/A    ED Visit Averted N/A    Life-Saving Intervention Made N/A

## 2022-12-23 LAB — BASIC METABOLIC PANEL: Glucose: 127

## 2022-12-23 NOTE — Congregational Nurse Program (Signed)
  Dept: [phone]   Congregational Nurse Program Note  Date of Encounter: 12/23/2022  Clinic visit to have assistance with Trulicity injection, stated that her blood glucose was 57 this AM and she has not rechecked it but did eat lunch.  Blood glucose 127, she has not had Novolog with the one meal she ate today. Advised to eat her dinner this evening and that she should eat regular meals and discuss with MD when to hold insulin for low blood glucose. Given Trulicity 0.75 mg in left lower quadrant of abdomen. Past Medical History: Past Medical History:  Diagnosis Date   Diabetes mellitus without complication (HCC)    DiGeorge syndrome (HCC)    Ectrodactyly    Genetic defect    genetic mutation of unknown significance per [facility] genetic notes   GERD (gastroesophageal reflux disease)    Hypercholesterolemia    Hypertension    Intellectual disability    Microcephaly (HCC)    Pelvic mass    Ventral hernia     Encounter Details:  CNP Questionnaire - 12/23/22 1725       Questionnaire   Ask client: Do you give verbal consent for me to treat you today? Yes    Student Assistance N/A    Location Patient [place]    Visit Setting with Client Organization    Patient Status Unknown   Has own apartment at [facility]    Insurance/Financial Assistance Referral N/A    Medication N/A    Medical Provider Yes    Screening Referrals Made N/A    Medical Referrals Made N/A    Medical Appointment Made N/A    Recently w/o PCP, now 1st time PCP visit completed due to CNs referral or appointment made N/A    Food Have Food Insecurities    Transportation Need transportation assistance    Housing/Utilities N/A    Interpersonal Safety N/A    Interventions Advocate/Support;Counsel;Educate;Case Management;Reviewed Medications    Abnormal to Normal Screening Since Last CN Visit Blood Glucose    Screenings CN Performed Blood Glucose    Sent Client to Lab  for: N/A    Did client attend any of the following based off CNs referral or appointments made? N/A    ED Visit Averted N/A    Life-Saving Intervention Made N/A

## 2022-12-30 LAB — BASIC METABOLIC PANEL: Glucose: 164

## 2022-12-31 NOTE — Congregational Nurse Program (Signed)
  Dept: [phone]   Congregational Nurse Program Note  Date of Encounter: 12/30/2022  Received call from nurse coordinator for the resident's 01/05/2023 outpatient breast biopsy.  Per nurse resident will have dressing that will need to be changed on 01/06/2023 clinic, steri-strips can be removed if still in place at clinic on 01/13/2023.  Educated resident on pre and post procedure care and that she will need to come to [facility] clinic for the follow-up care.    Blood glucose 164 ac dinner, states AM glucose low 50's today.  Educate regarding keeping blood glucose in a more narrow range to prevent complications. Given snacks to keep in her apartment for low blood glucose levels. Past Medical History: Past Medical History:  Diagnosis Date   Diabetes mellitus without complication (HCC)    DiGeorge syndrome (HCC)    Ectrodactyly    Genetic defect    genetic mutation of unknown significance per [facility] genetic notes   GERD (gastroesophageal reflux disease)    Hypercholesterolemia    Hypertension    Intellectual disability    Microcephaly (HCC)    Pelvic mass    Ventral hernia     Encounter Details:  CNP Questionnaire - 12/30/22 1800       Questionnaire   Ask client: Do you give verbal consent for me to treat you today? Yes    Student Assistance N/A    Location Patient [place]    Visit Setting with Hospital doctor;Phone/Text/Email    Patient Status Unknown   Has own apartment at [facility]    Insurance/Financial Assistance Referral N/A    Medication N/A    Medical Provider Yes    Screening Referrals Made N/A    Medical Referrals Made N/A    Medical Appointment Made N/A    Recently w/o PCP, now 1st time PCP visit completed due to CNs referral or appointment made N/A    Food Have Food Insecurities    Transportation Need transportation assistance    Housing/Utilities N/A    Interpersonal Safety N/A    Interventions  Advocate/Support;Counsel;Educate;Case Management    Abnormal to Normal Screening Since Last CN Visit N/A    Screenings CN Performed Blood Glucose    Sent Client to Lab for: N/A    Did client attend any of the following based off CNs referral or appointments made? N/A    ED Visit Averted N/A    Life-Saving Intervention Made N/A

## 2023-01-06 NOTE — Congregational Nurse Program (Signed)
  Dept: [phone]   Congregational Nurse Program Note  Date of Encounter: 01/06/2023  Came to [facility] to eat lunch and have surgical site checked.  Had breast biopsy right breat on yesterday.  Gauze dressing removed, no bleeding, redness, swelling or drainage noted.  States she is not having any pain.  Has 4 steri-strips in place over the incision.  Gave her instructions from the clinic nurse that she may shower but is not to rub the incision area nor remove the steri-strips.  Assisted wit Trulicity injection lower quadrant left abdomen.  Reviewed blood glucose results and intake for past 2 days. Past Medical History: Past Medical History:  Diagnosis Date   Diabetes mellitus without complication (HCC)    DiGeorge syndrome (HCC)    Ectrodactyly    Genetic defect    genetic mutation of unknown significance per [facility] genetic notes   GERD (gastroesophageal reflux disease)    Hypercholesterolemia    Hypertension    Intellectual disability    Microcephaly (HCC)    Pelvic mass    Ventral hernia     Encounter Details:  CNP Questionnaire - 01/06/23 1120       Questionnaire   Ask client: Do you give verbal consent for me to treat you today? Yes    Student Assistance N/A    Location Patient Served  [facility]    Visit Setting with Client Organization;Phone/Text/Email    Patient Status Unknown   Has own apartment at [facility]    Insurance/Financial Assistance Referral N/A    Medication N/A    Medical Provider Yes    Screening Referrals Made N/A    Medical Referrals Made N/A    Medical Appointment Made N/A    Recently w/o PCP, now 1st time PCP visit completed due to CNs referral or appointment made N/A    Food Have Food Insecurities    Transportation Need transportation assistance    Housing/Utilities N/A    Interpersonal Safety N/A    Interventions Advocate/Support;Counsel;Educate;Case Management    Abnormal to Normal Screening Since Last CN Visit N/A     Screenings CN Performed N/A    Sent Client to Lab for: N/A    Did client attend any of the following based off CNs referral or appointments made? N/A    ED Visit Averted N/A    Life-Saving Intervention Made N/A

## 2023-01-14 ENCOUNTER — Ambulatory Visit: Payer: Medicaid Other | Admitting: Podiatry

## 2023-01-14 ENCOUNTER — Encounter: Payer: Self-pay | Admitting: Podiatry

## 2023-01-14 DIAGNOSIS — M2011 Hallux valgus (acquired), right foot: Secondary | ICD-10-CM

## 2023-01-14 DIAGNOSIS — E119 Type 2 diabetes mellitus without complications: Secondary | ICD-10-CM

## 2023-01-14 DIAGNOSIS — E0865 Diabetes mellitus due to underlying condition with hyperglycemia: Secondary | ICD-10-CM

## 2023-01-14 DIAGNOSIS — B351 Tinea unguium: Secondary | ICD-10-CM | POA: Diagnosis not present

## 2023-01-14 DIAGNOSIS — M79675 Pain in left toe(s): Secondary | ICD-10-CM | POA: Diagnosis not present

## 2023-01-14 DIAGNOSIS — M79674 Pain in right toe(s): Secondary | ICD-10-CM | POA: Diagnosis not present

## 2023-01-14 DIAGNOSIS — M2012 Hallux valgus (acquired), left foot: Secondary | ICD-10-CM

## 2023-01-14 NOTE — Congregational Nurse Program (Signed)
  Dept: [phone]   Congregational Nurse Program Note  Date of Encounter: 01/13/2023  Clinic visit to remove steri-strips from right breast from breast biopsy on 01/05/2023.  Remove 4 steri-strips without difficulty, no drainage, redness or bleeding from the area.  Cleaned area with alcohol pads to remove adhesive.  Reviewed blood glucose check results, states she had two days with AM levels in low 60's and a 49 reading on yesterday. Discussed need for her to eat regular meals to prevent low blood glucose levels. States she is taking Lantus 20 units each PM.  Assisted with administering Trulicity in lower left quadrant of abdomen. Past Medical History: Past Medical History:  Diagnosis Date   Diabetes mellitus without complication (HCC)    DiGeorge syndrome (HCC)    Ectrodactyly    Genetic defect    genetic mutation of unknown significance per [facility] genetic notes   GERD (gastroesophageal reflux disease)    Hypercholesterolemia    Hypertension    Intellectual disability    Microcephaly (HCC)    Pelvic mass    Ventral hernia     Encounter Details:  CNP Questionnaire - 01/13/23 1215       Questionnaire   Ask client: Do you give verbal consent for me to treat you today? Yes    Student Assistance N/A    Location Patient Served  [facility]    Visit Setting with Client Organization;Phone/Text/Email    Patient Status Unknown   Has own apartment at [facility]    Insurance/Financial Assistance Referral N/A    Medication N/A    Medical Provider Yes    Screening Referrals Made N/A    Medical Referrals Made N/A    Medical Appointment Made N/A    Recently w/o PCP, now 1st time PCP visit completed due to CNs referral or appointment made N/A    Food Have Food Insecurities    Transportation Need transportation assistance    Housing/Utilities N/A    Interpersonal Safety N/A    Interventions Advocate/Support;Counsel;Educate;Case Management    Abnormal to  Normal Screening Since Last CN Visit N/A    Screenings CN Performed N/A    Sent Client to Lab for: N/A    Did client attend any of the following based off CNs referral or appointments made? N/A    ED Visit Averted N/A    Life-Saving Intervention Made N/A

## 2023-01-18 NOTE — Progress Notes (Signed)
ANNUAL DIABETIC FOOT EXAM  Subjective: [name] presents today annual diabetic foot exam.  Chief Complaint  Patient presents with   Diabetes    [facility] Blood sugar-48 A1C-UNKNOWN LVPCP-11/2022    Patient confirms h/o diabetes.  Patient has h/o foot/leg ulcer of left ankle.  Patient has been diagnosed with neuropathy.  Risk factors: uncontrolled diabetes, history of foot/leg ulcer, HTN.  Chauncy Lean, PA-C is patient's PCP.  Past Medical History:  Diagnosis Date   Diabetes mellitus without complication (HCC)    DiGeorge syndrome (HCC)    Ectrodactyly    Genetic defect    genetic mutation of unknown significance per [facility] genetic notes   GERD (gastroesophageal reflux disease)    Hypercholesterolemia    Hypertension    Intellectual disability    Microcephaly (HCC)    Pelvic mass    Ventral hernia    Patient Active Problem List   Diagnosis Date Noted   Hypoglycemia due to insulin 10/22/2022   Hypotension due to hypovolemia 12/17/2021   Acute lower UTI 12/17/2021   AKI (acute kidney injury) (HCC) 12/17/2021   Insulin-requiring or dependent type II diabetes mellitus (HCC) 12/17/2021   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HCC) 12/17/2021   Chest pain 09/28/2020   GERD (gastroesophageal reflux disease) 10/20/2019   Intellectual disability 03/09/2018   Microcephaly (HCC) 02/19/2017   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   Incarcerated ventral hernia 02/10/2017   DM (diabetes mellitus) type 2, uncontrolled, with ketoacidosis (HCC) 02/10/2017   Protein-calorie malnutrition, severe (HCC) 02/10/2017   H/O pelvic mass    DKA (diabetic ketoacidosis) (HCC) 02/09/2017   Hyponatremia 02/09/2017   Hyperkalemia 02/09/2017   Homelessness 02/09/2017   Giant ovarian mass    Past Surgical History:  Procedure Laterality Date   CHOLECYSTECTOMY     HAND SURGERY Right    HERNIA REPAIR     Current Outpatient Medications on File Prior to Visit  Medication Sig Dispense Refill    Accu-Chek Softclix Lancets lancets Use 3 (three) times daily as directed 100 each 12   ASPIRIN LOW DOSE 81 MG EC tablet Take 1 tablet (81 mg total) by mouth daily. (Patient taking differently: Take 81 mg by mouth at bedtime.) 100 tablet 2   Cholecalciferol (VITAMIN D3) 1.25 MG (50000 UT) CAPS Take 1 capsule by mouth once a week. (Patient not taking: Reported on 10/22/2022)     ferrous sulfate 300 (60 Fe) MG/5ML syrup Take 5 mLs (300 mg total) by mouth daily. (Patient taking differently: Take 300 mg by mouth 3 (three) times a week. Monday, Wednesday, and Friday) 150 mL 0   glucose blood (ACCU-CHEK GUIDE) test strip Use 3 (three) times daily as instructed 100 each 12   insulin aspart (NOVOLOG) 100 UNIT/ML FlexPen Inject 3 Units into the skin 3 (three) times daily with meals. 15 mL 0   insulin glargine (LANTUS) 100 UNIT/ML Solostar Pen Inject 6 Units into the skin 2 (two) times daily. 15 mL 0   omeprazole (PRILOSEC) 20 MG capsule Take 1 capsule (20 mg total) by mouth daily. (Patient taking differently: Take 20 mg by mouth daily before breakfast.) 30 capsule 2   pravastatin (PRAVACHOL) 20 MG tablet Take 20 mg by mouth daily.     TRULICITY 0.75 MG/0.5ML SOPN Inject 0.75 mg into the skin once a week.     No current facility-administered medications on file prior to visit.    No Known Allergies Social History   Occupational History   Not on file  Tobacco  Use   Smoking status: Never   Smokeless tobacco: Never  Vaping Use   Vaping status: Never Used  Substance and Sexual Activity   Alcohol use: No   Drug use: No   Sexual activity: Not on file   Family History  Problem Relation Age of Onset   Diabetes Mother    GER disease Mother    Diabetes Father    Immunization History  Administered Date(s) Administered   Influenza,inj,Quad PF,6+ Mos 02/10/2017   Pneumococcal Polysaccharide-23 02/10/2017     Review of Systems: Negative except as noted in the HPI.   Objective: There were no  vitals filed for this visit.  [name] is a pleasant 47 y.o. female in NAD. AAO X 3.   Title   Diabetic Foot Exam - detailed Is there a history of foot ulcer?: Yes Is there a foot ulcer now?: No Is there swelling?: No Is there elevated skin temperature?: No Is there abnormal foot shape?: No Is there a claw toe deformity?: Yes Are the toenails long?: Yes Are the toenails thick?: Yes Are the toenails ingrown?: No Is the skin thin, fragile, shiny and hairless?": No Normal Range of Motion?: Yes Is there foot or ankle muscle weakness?: No Do you have pain in calf while walking?: No Are the shoes appropriate in style and fit?: Yes Can the patient see the bottom of their feet?: Yes Right Posterior Tibialis: Present Left posterior Tibialis: Present   Right Dorsalis Pedis: Present Left Dorsalis Pedis: Present     Semmes-Weinstein Monofilament Test "+" means "has sensation" and "-" means "no sensation"  R Foot Test Control: Pos L Foot Test Control: Pos   R Site 1-Great Toe: Pos L Site 1-Great Toe: Pos   R Site 4: Pos L Site 4: Pos   R site 5: Pos L Site 5: Pos  R Site 6: Pos L Site 6: Pos     Image components are not supported.   Image components are not supported. Image components are not supported.  [place] Right vibratory: present Left vibratory: present  Comments     Lab Results  Component Value Date   HGBA1C 6.3 (H) 10/21/2022   ADA Risk Categorization: High Risk  Patient has one or more of the following: Loss of protective sensation Absent pedal pulses Severe Foot deformity History of foot ulcer  Assessment: 1. Pain due to onychomycosis of toenails of both feet   2. Hallux valgus, acquired, bilateral   3. Diabetes mellitus due to underlying condition, uncontrolled, with hyperglycemia (HCC)   4. Encounter for diabetic foot exam [facility])     Plan: -Patient was evaluated and treated. All patient's and/or POA's questions/concerns answered on today's  visit. -Diabetic foot examination performed today. -Continue foot and shoe inspections daily. Monitor blood glucose per PCP/Endocrinologist's recommendations. -Continue supportive shoe gear daily. -Toenails 1-5 b/l were debrided in length and girth with sterile nail nippers and dremel without iatrogenic bleeding.  -Patient/POA to call should there be question/concern in the interim. Return in about 3 months (around 04/16/2023).  Freddie Breech, DPM

## 2023-01-27 LAB — BASIC METABOLIC PANEL: Glucose: 231

## 2023-01-27 NOTE — Congregational Nurse Program (Signed)
  Dept: [phone]   Congregational Nurse Program Note  Date of Encounter: 01/27/2023  Clinic visit to check blood glucose and for assistance with Trulicity injection.  BP 122/80, pulse 90 and regular, weight 110 Lbs.  Blood glucose 231 after hot dog and fires for lunch, states that AM blood glucose was 39.  States she had Gatorade for low blood glucose and not sure what she had for dinner.  Educated regarding need to help keep blood glucose in narrow range to prevent complications of diabetes.  Right breat biopsy incision completely healed, denies any pain or drainage from the area. Past Medical History: Past Medical History:  Diagnosis Date   Diabetes mellitus without complication (HCC)    DiGeorge syndrome (HCC)    Ectrodactyly    Genetic defect    genetic mutation of unknown significance per [facility] genetic notes   GERD (gastroesophageal reflux disease)    Hypercholesterolemia    Hypertension    Intellectual disability    Microcephaly (HCC)    Pelvic mass    Ventral hernia     Encounter Details:  Community Questionnaire - 01/27/23 1650       Questionnaire   Ask client: Do you give verbal consent for me to treat you today? Yes    Student Assistance N/A    Location Patient [place]    Encounter Setting CN site    Population Status Unknown   Has own apartment at [facility]    Insurance/Financial Assistance Referral N/A    Medication N/A    Medical Provider Yes    Screening Referrals Made N/A    Medical Referrals Made N/A    Medical Appointment Completed [facility] PCP/Clinic    CNP Interventions Advocate/Support;Counsel;Educate;Reviewed Medications    Screenings CN Performed Weight;Blood Pressure    ED Visit Averted N/A    Life-Saving Intervention Made N/A      Questionnaire   Housing/Utilities N/A

## 2023-02-03 LAB — BASIC METABOLIC PANEL
Glucose: 58
Glucose: 81

## 2023-02-03 NOTE — Congregational Nurse Program (Signed)
  Dept: [phone]   Congregational Nurse Program Note Date of Encounter: 02/03/2023  Clinic visit to assist with Trulicity injection.  Stated that blood glucose was in the 50's this am and only eaten [place] today.  Blood glucose 58, given glucose tablet and a granola bar.  Repeat glucose 30 minutes later 81.  Md office notifed of blood glucose levels wide range and that she doesn't eat full meals on a regular basis. Has MD appointment on 11/8. Past Medical History: Past Medical History:  Diagnosis Date   Diabetes mellitus without complication (HCC)    DiGeorge syndrome (HCC)    Ectrodactyly    Genetic defect    genetic mutation of unknown significance per [facility] genetic notes   GERD (gastroesophageal reflux disease)    Hypercholesterolemia    Hypertension    Intellectual disability    Microcephaly (HCC)    Pelvic mass    Ventral hernia     Encounter Details:  Community Questionnaire - 02/03/23 1145       Questionnaire   Ask client: Do you give verbal consent for me to treat you today? Yes    Student Assistance N/A    Location Patient Served  [facility]    Encounter Setting CN site    Population Status Unknown   Has own apartment at [facility]    Insurance/Financial Assistance Referral N/A    Medication N/A    Medical Provider Yes    Screening Referrals Made N/A    Medical Referrals Made [facility] PCP/Clinic    Medical Appointment Completed N/A    CNP Interventions Advocate/Support;Counsel;Educate;Reviewed Medications    Screenings CN Performed Weight;Blood Glucose;Blood Pressure    ED Visit Averted Yes    Life-Saving Intervention Made N/A      Questionnaire   Housing/Utilities N/A

## 2023-02-10 NOTE — Congregational Nurse Program (Signed)
  Dept: [phone]   Congregational Nurse Program Note  Date of Encounter: 02/10/2023  Clinic visit to assist with Trulicity injection.  States that she has had two days in past week with low blood sugar levels, in the 40's this AM.  States she used glucose tablets and ate a granola bar.  Reviewed meals and from her report she is not eating a full meal in the evening but taking insulin. Advised to discuss what she is eating and to take her glucometer with her to MD appointment on Friday. Past Medical History: Past Medical History:  Diagnosis Date   Diabetes mellitus without complication (HCC)    DiGeorge syndrome (HCC)    Ectrodactyly    Genetic defect    genetic mutation of unknown significance per [facility] genetic notes   GERD (gastroesophageal reflux disease)    Hypercholesterolemia    Hypertension    Intellectual disability    Microcephaly (HCC)    Pelvic mass    Ventral hernia     Encounter Details:  Community Questionnaire - 02/10/23 1325       Questionnaire   Ask client: Do you give verbal consent for me to treat you today? Yes    Student Assistance N/A    Location Patient Served  [place]    Encounter Setting CN site    Population Status Unknown   Has own apartment at [facility]    Insurance/Financial Assistance Referral N/A    Medication N/A    Medical Provider Yes    Screening Referrals Made N/A    Medical Referrals Made N/A    Medical Appointment Completed N/A    CNP Interventions Counsel;Educate    Screenings CN Performed N/A    ED Visit Averted N/A    Life-Saving Intervention Made N/A

## 2023-02-17 DIAGNOSIS — E1110 Type 2 diabetes mellitus with ketoacidosis without coma: Secondary | ICD-10-CM

## 2023-02-17 LAB — GLUCOSE, POCT (MANUAL RESULT ENTRY): POC Glucose: 58 mg/dL — AB (ref 70–99)

## 2023-02-17 NOTE — Congregational Nurse Program (Signed)
  Dept: [phone]   Congregational Nurse Program Note  Date of Encounter: 02/17/2023  Clinic visit to assist with Trulicity injection.  States blood glucose was low this am and she ate a granola bar, ate [place] fries for lunch about 1.5 hours before clinic visit. Blood glucose 58, given bottle of Ensure that has 10 grams protein, 33 grams carbohydrate.  Stated that she felt better after the Ensure.  Confirmed her MD appointment at [facility] for Thursday at 11:45 and reported the low blood glucose results over several weeks and that Trulicity injection held today. Past Medical History: Past Medical History:  Diagnosis Date   Diabetes mellitus without complication (HCC)    DiGeorge syndrome (HCC)    Ectrodactyly    Genetic defect    genetic mutation of unknown significance per [facility] genetic notes   GERD (gastroesophageal reflux disease)    Hypercholesterolemia    Hypertension    Intellectual disability    Microcephaly (HCC)    Pelvic mass    Ventral hernia     Encounter Details:  Community Questionnaire - 02/17/23 1300       Questionnaire   Ask client: Do you give verbal consent for me to treat you today? Yes    Student Assistance N/A    Location Patient [place]    Encounter Setting CN site    Population Status Unknown   Has own apartment at [facility]    Insurance/Financial Assistance Referral N/A    Medication N/A    Medical Provider Yes    Screening Referrals Made N/A    Medical Referrals Made Non-[facility] PCP/Clinic    Medical Appointment Completed N/A    CNP Interventions Counsel;Educate;Case Management;Reviewed Medications;Advocate/Support    Screenings CN Performed Blood Glucose    ED Visit Averted Yes    Life-Saving Intervention Made N/A

## 2023-03-10 NOTE — Congregational Nurse Program (Signed)
  Dept: [phone]   Congregational Nurse Program Note  Date of Encounter: 02/24/2023  Clinic visit to assist with Trulicity injection.  Resident has physical disability that make managing the Trulicity prefilled syringe difficult.  Injection given in left lower quadrant of abdomen. Past Medical History: Past Medical History:  Diagnosis Date   Diabetes mellitus without complication (HCC)    DiGeorge syndrome (HCC)    Ectrodactyly    Genetic defect    genetic mutation of unknown significance per [facility] genetic notes   GERD (gastroesophageal reflux disease)    Hypercholesterolemia    Hypertension    Intellectual disability    Microcephaly (HCC)    Pelvic mass    Ventral hernia     Encounter Details:  Community Questionnaire - 02/24/23 1600       Questionnaire   Ask client: Do you give verbal consent for me to treat you today? Yes    Student Assistance N/A    Location Patient [place]    Encounter Setting CN site    Population Status Unknown   Has own apartment at [facility]    Insurance/Financial Assistance Referral N/A    Medication N/A    Medical Provider Yes    Screening Referrals Made N/A    Medical Referrals Made N/A    Medical Appointment Completed N/A    CNP Interventions Counsel;Educate;Case Management;Reviewed Medications;Advocate/Support    Screenings CN Performed N/A    ED Visit Averted N/A    Life-Saving Intervention Made N/A

## 2023-03-10 NOTE — Congregational Nurse Program (Signed)
  Dept: [phone]   Congregational Nurse Program Note  Date of Encounter: 03/10/2023  Clinic visit to take blood pressure and assist with Trulicity injection.  BP 102/68, pulse 96 and regular, O2 Sat 99%.  Trulicity injection given in left lower quadrant of abdomen. Discussed blood glucose finger-stick results for past week.  This am had 41 fasting after over 200 the past PM.  Reviewed intake and discussed need to eat regular meals to manage blood glucose levels and prevent the number of low and high levels.  Meet with resident and [facility] case manager to discuss need to keep apartment clean to prevent further issues with roaches and insects. Case manager to visit her weekly to monitor condition of apartment. Past Medical History: Past Medical History:  Diagnosis Date   Diabetes mellitus without complication (HCC)    DiGeorge syndrome (HCC)    Ectrodactyly    Genetic defect    genetic mutation of unknown significance per [facility] genetic notes   GERD (gastroesophageal reflux disease)    Hypercholesterolemia    Hypertension    Intellectual disability    Microcephaly (HCC)    Pelvic mass    Ventral hernia     Encounter Details:  Community Questionnaire - 03/10/23 1615       Questionnaire   Ask client: Do you give verbal consent for me to treat you today? Yes    Student Assistance N/A    Location Patient [facility]    Encounter Setting CN site    Population Status Unknown   Has own apartment at [facility]    Insurance/Financial Assistance Referral N/A    Medication N/A    Medical Provider Yes    Screening Referrals Made N/A    Medical Referrals Made N/A    Medical Appointment Completed N/A    CNP Interventions Counsel;Educate;Case Management;Reviewed Medications;Advocate/Support    Screenings CN Performed Blood Pressure    ED Visit Averted N/A    Life-Saving Intervention Made N/A

## 2023-03-17 NOTE — Congregational Nurse Program (Signed)
  Dept: [phone]   Congregational Nurse Program Note  Date of Encounter: 03/17/2023  Clinic visit to assist with Trulicity injection, blood glucose 74 this am per her report. Blood glucose in the 40's two to three days in past week, with evening glucose in the 200's.  Educated regarding preventing diabetic foot care and need to eat regular meals to manage blood glucose.  Assisted with Trulicity 0.75 injection left lower quadrant of abdomen. Past Medical History: Past Medical History:  Diagnosis Date   Diabetes mellitus without complication (HCC)    DiGeorge syndrome (HCC)    Ectrodactyly    Genetic defect    genetic mutation of unknown significance per [facility] genetic notes   GERD (gastroesophageal reflux disease)    Hypercholesterolemia    Hypertension    Intellectual disability    Microcephaly (HCC)    Pelvic mass    Ventral hernia     Encounter Details:  Community Questionnaire - 03/17/23 1603       Questionnaire   Ask client: Do you give verbal consent for me to treat you today? Yes    Student Assistance N/A    Location Patient [facility]    Encounter Setting CN site    Population Status Unknown   Has own apartment at [facility]    Insurance/Financial Assistance Referral N/A    Medication N/A    Medical Provider Yes    Screening Referrals Made N/A    Medical Referrals Made N/A    Medical Appointment Completed N/A    CNP Interventions Counsel;Educate;Case Management;Advocate/Support    Screenings CN Performed Blood Pressure;Weight    ED Visit Averted N/A    Life-Saving Intervention Made N/A

## 2023-03-24 NOTE — Congregational Nurse Program (Signed)
  Dept: [phone]   Congregational Nurse Program Note  Date of Encounter: 03/24/2023  Past Medical History: Past Medical History:  Diagnosis Date   Diabetes mellitus without complication (HCC)    DiGeorge syndrome (HCC)    Ectrodactyly    Genetic defect    genetic mutation of unknown significance per [facility] genetic notes   GERD (gastroesophageal reflux disease)    Hypercholesterolemia    Hypertension    Intellectual disability    Microcephaly (HCC)    Pelvic mass    Ventral hernia     Encounter Details:  Community Questionnaire - 03/24/23 1600       Questionnaire   Ask client: Do you give verbal consent for me to treat you today? Yes    Student Assistance N/A    Location Patient [facility]    Encounter Setting Phone/Text/Email   Telephone call to resident to determine if Trulicity injection needed   Population Status Unknown   Has own apartment at [facility]    Insurance/Financial Assistance Referral N/A    Medication N/A    Medical Provider Yes    Screening Referrals Made N/A    Medical Referrals Made N/A    Medical Appointment Completed N/A    CNP Interventions Counsel;Advocate/Support    Screenings CN Performed N/A    ED Visit Averted N/A    Life-Saving Intervention Made N/A               Dept: [phone]   Congregational Nurse Program Note  Date of Encounter: 03/24/2023  Spoke to resident by telephone, refill of Trulicity not available until tomorrow.  Gave her information on availability of CCNP nurses at [employer] on Thursday to assist with her weekly injection. Past Medical History: Past Medical History:  Diagnosis Date   Diabetes mellitus without complication (HCC)    DiGeorge syndrome (HCC)    Ectrodactyly    Genetic defect    genetic mutation of unknown significance per [facility] genetic notes   GERD (gastroesophageal reflux disease)    Hypercholesterolemia    Hypertension    Intellectual  disability    Microcephaly (HCC)    Pelvic mass    Ventral hernia     Encounter Details:  Community Questionnaire - 03/24/23 1600       Questionnaire   Ask client: Do you give verbal consent for me to treat you today? Yes    Student Assistance N/A    Location Patient [facility]    Encounter Setting Phone/Text/Email   Telephone call to resident to determine if Trulicity injection needed   Population Status Unknown   Has own apartment at [facility]    Insurance/Financial Assistance Referral N/A    Medication N/A    Medical Provider Yes    Screening Referrals Made N/A    Medical Referrals Made N/A    Medical Appointment Completed N/A    CNP Interventions Counsel;Advocate/Support    Screenings CN Performed N/A    ED Visit Averted N/A    Life-Saving Intervention Made N/A

## 2023-03-26 NOTE — Congregational Nurse Program (Signed)
  Dept: [phone]   Congregational Nurse Program Note  Date of Encounter: 03/26/2023  Past Medical History: Past Medical History:  Diagnosis Date   Diabetes mellitus without complication (HCC)    DiGeorge syndrome (HCC)    Ectrodactyly    Genetic defect    genetic mutation of unknown significance per [facility] genetic notes   GERD (gastroesophageal reflux disease)    Hypercholesterolemia    Hypertension    Intellectual disability    Microcephaly (HCC)    Pelvic mass    Ventral hernia     Encounter Details:  Community Questionnaire - 03/26/23 1102       Questionnaire   Ask client: Do you give verbal consent for me to treat you today? Yes    Student Assistance N/A    Location Patient Served  [facility]    Encounter Setting CN site    Population Status Unknown   Lives at [facility]    Insurance/Financial Assistance Referral N/A    Medication Provided Medication Assistance    Medical Provider Yes    Screening Referrals Made N/A    Medical Referrals Made N/A    Medical Appointment Completed N/A    CNP Interventions Counsel;Advocate/Support;Educate    Screenings CN Performed N/A    ED Visit Averted N/A    Life-Saving Intervention Made N/A           Client came to clinic for assistance in taking Trulicity injections. Client educated through process on how to give herself injections. Walked client through steps to give self injection. Client completed with minimal assistance. States she can now give injections by herself without assistance.

## 2023-04-07 ENCOUNTER — Encounter: Payer: Self-pay | Admitting: *Deleted

## 2023-04-07 LAB — GLUCOSE, POCT (MANUAL RESULT ENTRY)
POC Glucose: 43 mg/dL — AB (ref 70–99)
POC Glucose: 48 mg/dL — AB (ref 70–99)
POC Glucose: 64 mg/dL — AB (ref 70–99)
POC Glucose: 84 mg/dL (ref 70–99)

## 2023-04-07 NOTE — Congregational Nurse Program (Signed)
  Dept: [phone]   Congregational Nurse Program Note  Date of Encounter: 04/07/2023  Past Medical History: Past Medical History:  Diagnosis Date   Diabetes mellitus without complication (HCC)    DiGeorge syndrome (HCC)    Ectrodactyly    Genetic defect    genetic mutation of unknown significance per [facility] genetic notes   GERD (gastroesophageal reflux disease)    Hypercholesterolemia    Hypertension    Intellectual disability    Microcephaly (HCC)    Pelvic mass    Ventral hernia     Encounter Details:  Community Questionnaire - 04/07/23 1251       Questionnaire   Ask client: Do you give verbal consent for me to treat you today? Yes    Student Assistance N/A    Location Patient Served  [facility]    Encounter Setting CN site;Phone/Text/Email    Population Status Unknown    Insurance Medicaid    Insurance/Financial Assistance Referral N/A    Medication Have Medication Insecurities    Medical Provider Yes    Screening Referrals Made N/A    Medical Referrals Made Non-[facility] PCP/Clinic    Medical Appointment Completed N/A    CNP Interventions Advocate/Support;Case Management;Educate    Screenings CN Performed Blood Glucose    ED Visit Averted N/A    Life-Saving Intervention Made N/A            Client came to nurse's office for assistance with Trulicity injection. She came prior to lunch so writer checked blood sugar. CBG 43. Client is asymptomatic. Gave OJ and continued to recheck until reading CBG 84. Contacted Dr Brien to withhold Trulicity.. Instructed to contact PCP. Called DR [employer] office and left message with CBG readings and planning to hold Trulicity dose. Requested call back for further instructions. Called [address] [facility] multiple times at end of day and was disconnected multiple times. Spoke with receptionist and left another message with MD Younger covering Dr Darwyn patients today to contact pt [phone] (client's phone) for instructions about  Trulicity and low CBG . Contacted client and gave this information. She is holding Trulicity until instructed by MD  Madison ORN RN CN

## 2023-04-14 DIAGNOSIS — N920 Excessive and frequent menstruation with regular cycle: Secondary | ICD-10-CM | POA: Insufficient documentation

## 2023-04-21 DIAGNOSIS — E1110 Type 2 diabetes mellitus with ketoacidosis without coma: Secondary | ICD-10-CM

## 2023-04-21 LAB — GLUCOSE, POCT (MANUAL RESULT ENTRY): POC Glucose: 292 mg/dL — AB (ref 70–99)

## 2023-04-29 NOTE — Congregational Nurse Program (Signed)
  Dept: [phone]   Congregational Nurse Program Note  Date of Encounter: 04/21/2023  Clinic visit to discuss change in medications for diabetes.  States she is to hold Trulicity injections until next MD visit in February.  BP 108/64, blood glucose 292 this visit, states glucose this morning was in the 140's.  Reviewed the normal ranges for blood glucose and when she should call MD. Past Medical History: Past Medical History:  Diagnosis Date   Diabetes mellitus without complication (HCC)    DiGeorge syndrome (HCC)    Ectrodactyly    Genetic defect    genetic mutation of unknown significance per [facility] genetic notes   GERD (gastroesophageal reflux disease)    Hypercholesterolemia    Hypertension    Intellectual disability    Microcephaly (HCC)    Pelvic mass    Ventral hernia     Encounter Details:  Community Questionnaire - 04/21/23 1650       Questionnaire   Ask client: Do you give verbal consent for me to treat you today? Yes    Student Assistance N/A    Location Patient [facility]    Encounter Setting CN site    Population Status Unknown   Has apartment at [facility]    Insurance/Financial Assistance Referral N/A    Medication Have Medication Insecurities    Medical Provider Yes    Screening Referrals Made N/A    Medical Referrals Made N/A    Medical Appointment Completed N/A    CNP Interventions Advocate/Support;Educate;Counsel    Screenings CN Performed Blood Glucose;Blood Pressure    ED Visit Averted N/A    Life-Saving Intervention Made N/A

## 2023-05-05 ENCOUNTER — Ambulatory Visit (INDEPENDENT_AMBULATORY_CARE_PROVIDER_SITE_OTHER): Payer: Medicaid Other | Admitting: Podiatry

## 2023-05-05 ENCOUNTER — Encounter: Payer: Self-pay | Admitting: Podiatry

## 2023-05-05 DIAGNOSIS — M79674 Pain in right toe(s): Secondary | ICD-10-CM

## 2023-05-05 DIAGNOSIS — E1110 Type 2 diabetes mellitus with ketoacidosis without coma: Secondary | ICD-10-CM

## 2023-05-05 DIAGNOSIS — E1142 Type 2 diabetes mellitus with diabetic polyneuropathy: Secondary | ICD-10-CM | POA: Diagnosis not present

## 2023-05-05 DIAGNOSIS — M79675 Pain in left toe(s): Secondary | ICD-10-CM

## 2023-05-05 DIAGNOSIS — B351 Tinea unguium: Secondary | ICD-10-CM | POA: Diagnosis not present

## 2023-05-05 LAB — GLUCOSE, POCT (MANUAL RESULT ENTRY): POC Glucose: 71 mg/dL (ref 70–99)

## 2023-05-05 NOTE — Progress Notes (Signed)
  Subjective:  Patient ID: [name], female    DOB: 10/10/1975,  MRN: [id number]  48 y.o. female presents to clinic with  at risk foot care. Patient has h/o diabetes with history of ulceration of left ankle and bilateral heels and painful elongated mycotic toenails 1-5 bilaterally which are tender when wearing enclosed shoe gear. Pain is relieved with periodic professional debridement. Patient states her feet are numb most of the time. She relates no pain which interrupts her sleep. Chief Complaint  Patient presents with   [facility]    She is here for diabetic foot exam and she forgot her last A1C level PCP is patrick waterson, last OV 10/24   New problem(s): None   PCP is Chauncy Lean, PA-C.  No Known Allergies  Review of Systems: Negative except as noted in the HPI.   Objective:  [name] is a pleasant 48 y.o. female thin build in NAD. AAO x 3.  Vascular Examination: Vascular status intact b/l with faintly palpable pedal pulses. CFT immediate b/l. No edema. No pain with calf compression b/l. Skin temperature gradient WNL b/l. No ischemia or gangrene noted b/l LE. No cyanosis or clubbing noted b/l LE.  Neurological Examination: Sensation grossly intact b/l with 10 gram monofilament. Vibratory sensation intact b/l. Pt has subjective symptoms of neuropathy.  Dermatological Examination: Pedal skin with normal turgor, texture and tone b/l. Toenails 1-5 b/l thick, discolored, elongated with subungual debris and pain on dorsal palpation. No hyperkeratotic lesions noted b/l.   Musculoskeletal Examination: Muscle strength 5/5 to b/l LE. HAV with bunion bilaterally and hammertoes 2-5 b/l.  Radiographs: None  Last A1c:      Latest Ref Rng & Units 10/21/2022   11:40 PM  Hemoglobin A1C  Hemoglobin-A1c 4.8 - 5.6 % 6.3      Assessment:   1. Pain due to onychomycosis of toenails of both feet   2. Diabetic peripheral neuropathy associated with type 2 diabetes mellitus  (HCC)    Plan:  -Consent given for treatment as described below: -Examined patient. -Continue foot and shoe inspections daily. Monitor blood glucose per PCP/Endocrinologist's recommendations. -Patient to continue soft, supportive shoe gear daily. -Mycotic toenails 1-5 bilaterally were debrided in length and girth with sterile nail nippers and dremel without incident. -Patient/POA to call should there be question/concern in the interim.  Return in about 3 months (around 08/03/2023).  Freddie Breech, DPM      [place] LOCATION: [address] [place] [address]                   Office [phone]   [facility] LOCATION: [address] [place], [place] [address] Office [phone]

## 2023-05-05 NOTE — Congregational Nurse Program (Signed)
  Dept: [phone]   Congregational Nurse Program Note  Date of Encounter: 05/05/2023  Clinic visit to check blood glucose and discuss managing diabetes.  States blood glucose was in the 150's before bed last night and she didn't eat anything due to nausea.  States blood glucose 52 this am, drank fruit punch before she went to lunch at [facility] but didn't get to eat it d/t going to a podiatry appointment. Had a take out plate with her, blood glucose 71 at clinic. Reminded her to eat as soon as she could walk to her apartment. Discussed need to eat regular meals to keep blood glucose within normal limits.  Had complaint of some diarrhea as well as the intermittent nausea.  Both symptoms possibly side effects from there other medications to be discussed at next clinic visit. Past Medical History: Past Medical History:  Diagnosis Date   Diabetes mellitus without complication (HCC)    DiGeorge syndrome (HCC)    Ectrodactyly    Genetic defect    genetic mutation of unknown significance per [facility] genetic notes   GERD (gastroesophageal reflux disease)    Hypercholesterolemia    Hypertension    Intellectual disability    Microcephaly (HCC)    Pelvic mass    Ventral hernia     Encounter Details:  Community Questionnaire - 05/05/23 1543       Questionnaire   Ask client: Do you give verbal consent for me to treat you today? Yes    Student Assistance N/A    Location Patient [facility]    Encounter Setting CN site    Population Status Unknown   Has apartment at [facility]    Insurance/Financial Assistance Referral N/A    Medication Have Medication Insecurities;Patient Medications Reviewed    Medical Provider Yes    Screening Referrals Made N/A    Medical Referrals Made N/A    Medical Appointment Completed N/A    CNP Interventions Advocate/Support;Educate;Counsel;Case Management    Screenings CN Performed Blood Glucose    ED Visit  Averted N/A    Life-Saving Intervention Made N/A

## 2023-05-12 DIAGNOSIS — E1110 Type 2 diabetes mellitus with ketoacidosis without coma: Secondary | ICD-10-CM

## 2023-05-12 LAB — GLUCOSE, POCT (MANUAL RESULT ENTRY): POC Glucose: 156 mg/dL — AB (ref 70–99)

## 2023-05-12 NOTE — Congregational Nurse Program (Signed)
  Dept: [phone]   Congregational Nurse Program Note  Date of Encounter: 05/12/2023  Clinic visit to monitor blood glucose while off Trulicity and Novolog .  States she is taking Lantus  each AM and PM as prescribed.  Per her Accu-check glucometer average blood glucose for pas 7 days is 152.  Has had lows of 41 and today 55 in AM, evening ranges from 150 to 292.  Blood glucose 156 at time of clinic visit prior to dinner.  Reviewed meals for this week, encouraged to eat regular meals, decrease take-out hamburgers, eat more fruits and vegetables. Educated regarding shopping for healthful items that require little or no preparation.  Past Medical History: Past Medical History:  Diagnosis Date   Diabetes mellitus without complication (HCC)    DiGeorge syndrome (HCC)    Ectrodactyly    Genetic defect    genetic mutation of unknown significance per [facility] genetic notes   GERD (gastroesophageal reflux disease)    Hypercholesterolemia    Hypertension    Intellectual disability    Microcephaly (HCC)    Pelvic mass    Ventral hernia     Encounter Details:  Community Questionnaire - 05/12/23 1655       Questionnaire   Ask client: Do you give verbal consent for me to treat you today? Yes    Student Assistance N/A    Location Patient [facility]    Encounter Setting CN site    Population Status Unknown   Has apartment at [facility]    Insurance/Financial Assistance Referral N/A    Medication Have Medication Insecurities;Patient Medications Reviewed    Medical Provider Yes    Screening Referrals Made N/A    Medical Referrals Made N/A    Medical Appointment Completed N/A    CNP Interventions Advocate/Support;Educate;Counsel    Screenings CN Performed Blood Glucose    ED Visit Averted N/A    Life-Saving Intervention Made N/A

## 2023-06-02 DIAGNOSIS — E1110 Type 2 diabetes mellitus with ketoacidosis without coma: Secondary | ICD-10-CM

## 2023-06-02 NOTE — Congregational Nurse Program (Signed)
  Dept: [phone]   Congregational Nurse Program Note  Date of Encounter: 06/02/2023  Clinic visit to assist with Trulicity injection.  States that blood glucose was 266 this morning, blood glucose 284 at visit.  Had not eaten anything today nor has she taken insulin. Given Trulicity in left lower quadrant of abdomen.   Past Medical History: Past Medical History:  Diagnosis Date   Diabetes mellitus without complication (HCC)    DiGeorge syndrome (HCC)    Ectrodactyly    Genetic defect    genetic mutation of unknown significance per [facility] genetic notes   GERD (gastroesophageal reflux disease)    Hypercholesterolemia    Hypertension    Intellectual disability    Microcephaly (HCC)    Pelvic mass    Ventral hernia     Encounter Details:  Community Questionnaire - 06/02/23 1045       Questionnaire   Ask client: Do you give verbal consent for me to treat you today? Yes    Student Assistance N/A    Location Patient Served  [facility]    Encounter Setting CN site    Population Status Unknown   Has apartment at [facility]    Insurance/Financial Assistance Referral N/A    Medication Have Medication Insecurities;Patient Medications Reviewed;Provided Medication Assistance    Medical Provider Yes    Screening Referrals Made N/A    Medical Referrals Made N/A    Medical Appointment Completed N/A    CNP Interventions Advocate/Support;Educate;Counsel    Screenings CN Performed Blood Glucose    ED Visit Averted N/A    Life-Saving Intervention Made N/A

## 2023-06-11 NOTE — Congregational Nurse Program (Signed)
  Dept: [phone]   Congregational Nurse Program Note  Date of Encounter: 06/09/2023  Clinic visit to assist with her Trulicity injection. Had brought her home glucometer for review of blood glucose results for past week.  Has not had extreme low test results as in previous week but continues to have wide range of glucose levels from mid 70's in AM to near 300 prior to evening meal.  Reminded her of need to have all glucose test results below 180 after meals to prevent complications of diabetes.  Assisted with Trulicity injection in left lower quadrant of abdomen. Past Medical History: Past Medical History:  Diagnosis Date   Diabetes mellitus without complication (HCC)    DiGeorge syndrome (HCC)    Ectrodactyly    Genetic defect    genetic mutation of unknown significance per [facility] genetic notes   GERD (gastroesophageal reflux disease)    Hypercholesterolemia    Hypertension    Intellectual disability    Microcephaly (HCC)    Pelvic mass    Ventral hernia     Encounter Details:  Community Questionnaire - 06/09/23 1230       Questionnaire   Ask client: Do you give verbal consent for me to treat you today? Yes    Student Assistance N/A    Location Patient Served  [facility]    Encounter Setting CN site    Population Status Unknown   Has apartment at [facility]    Insurance/Financial Assistance Referral N/A    Medication Have Medication Insecurities;Patient Medications Reviewed;Provided Medication Assistance    Medical Provider Yes    Screening Referrals Made N/A    Medical Referrals Made N/A    Medical Appointment Completed N/A    CNP Interventions Advocate/Support;Educate;Counsel    Screenings CN Performed N/A    ED Visit Averted N/A    Life-Saving Intervention Made N/A

## 2023-06-16 NOTE — Congregational Nurse Program (Signed)
  Dept: [phone]   Congregational Nurse Program Note  Date of Encounter: 06/16/2023  Clinic visit to assist with Trulicity injection.  States that blood glucose was 132 this AM, over 200 before dinner on 06/15/23. Did not take AM insulin today because she didn't eat. Stated that she's not sure if she is taking correct amount of Lantus in the evening due to type of syringe given to her at the pharmacy last time she received the medicine.  [facility] pharmacy,  she is to take syringes to the pharmacy in AM for them to determine if incorrect syringes were given out.  Trulicity 0.75mg  given with assistance lower left quadrant of abdomen. Reviewed meals choices and reminded her to take insulin as prescribed and eating meals on a regular basis will help get blood glucose regulated.  Past Medical History: Past Medical History:  Diagnosis Date   Diabetes mellitus without complication (HCC)    DiGeorge syndrome (HCC)    Ectrodactyly    Genetic defect    genetic mutation of unknown significance per [facility] genetic notes   GERD (gastroesophageal reflux disease)    Hypercholesterolemia    Hypertension    Intellectual disability    Microcephaly (HCC)    Pelvic mass    Ventral hernia     Encounter Details:  Community Questionnaire - 06/16/23 1140       Questionnaire   Ask client: Do you give verbal consent for me to treat you today? Yes    Student Assistance N/A    Location Patient Served  [facility]    Encounter Setting CN site    Population Status Unknown   Has apartment at [facility]    Insurance/Financial Assistance Referral N/A    Medication Have Medication Insecurities;Patient Medications Reviewed;Provided Medication Assistance    Medical Provider Yes    Screening Referrals Made N/A    Medical Referrals Made N/A    Medical Appointment Completed N/A    CNP Interventions Advocate/Support;Educate;Counsel;Case Management    Screenings CN Performed N/A     ED Visit Averted N/A    Life-Saving Intervention Made N/A

## 2023-06-23 DIAGNOSIS — E1110 Type 2 diabetes mellitus with ketoacidosis without coma: Secondary | ICD-10-CM

## 2023-06-23 LAB — GLUCOSE, POCT (MANUAL RESULT ENTRY)
POC Glucose: 125 mg/dL — AB (ref 70–99)
POC Glucose: 40 mg/dL — AB (ref 70–99)

## 2023-06-23 NOTE — Congregational Nurse Program (Signed)
  Dept: [phone]   Congregational Nurse Program Note  Date of Encounter: 06/23/2023  Clinic visit for assistance with Trulicity injection.  Stated that blood glucose was low this AM, blood glucose 40 when checked at clinic.Stated she did not eat any breakfast after eating candy for the low blood glucose Given Ensure and 4 oz carton of orange juice.  Blood glucose 125 when checked 20 minutes after the juice and Ensure.  Assisted with Trulicity 0.75 in left lower quadrant of abdomen.  Discussed the ned to eat regular meals to prevent the episodes of low blood glucose and to better maintain diabetes. Past Medical History: Past Medical History:  Diagnosis Date   Diabetes mellitus without complication (HCC)    DiGeorge syndrome (HCC)    Ectrodactyly    Genetic defect    genetic mutation of unknown significance per [facility] genetic notes   GERD (gastroesophageal reflux disease)    Hypercholesterolemia    Hypertension    Intellectual disability    Microcephaly (HCC)    Pelvic mass    Ventral hernia     Encounter Details:  Community Questionnaire - 06/23/23 1050       Questionnaire   Ask client: Do you give verbal consent for me to treat you today? Yes    Student Assistance N/A    Location Patient Served  [facility]    Encounter Setting CN site    Population Status Unknown   Has apartment at [facility]    Insurance/Financial Assistance Referral N/A    Medication Have Medication Insecurities;Patient Medications Reviewed;Provided Medication Assistance    Medical Provider Yes    Screening Referrals Made N/A    Medical Referrals Made N/A    Medical Appointment Completed N/A    CNP Interventions Advocate/Support;Educate;Counsel;Case Management    Screenings CN Performed Blood Glucose    ED Visit Averted Yes    Life-Saving Intervention Made N/A

## 2023-06-30 NOTE — Congregational Nurse Program (Signed)
  Dept: [phone]   Congregational Nurse Program Note  Date of Encounter: 06/30/2023  Clinic visit to assist with her Trulicity injection.  Reviewed blood glucose results for past week and her intake.  Counseled regarding need to eat regular meals to prevent the blood glucose lows and spikes.  Assisted with Trulicity 0.75 mg injection lower left quadrant of abdomen.  Past Medical History: Past Medical History:  Diagnosis Date   Diabetes mellitus without complication (HCC)    DiGeorge syndrome (HCC)    Ectrodactyly    Genetic defect    genetic mutation of unknown significance per [facility] genetic notes   GERD (gastroesophageal reflux disease)    Hypercholesterolemia    Hypertension    Intellectual disability    Microcephaly (HCC)    Pelvic mass    Ventral hernia     Encounter Details:  Community Questionnaire - 06/30/23 1143       Questionnaire   Ask client: Do you give verbal consent for me to treat you today? Yes    Student Assistance N/A    [facility] S[facility]  [facility]    Encounter Setting CN site    Population Status Unknown   Has apartment at [facility]    Insurance/Financial Assistance Referral N/A    Medication Have Medication Insecurities;Patient Medications Reviewed;Provided Medication Assistance    Medical Provider Yes    Screening Referrals Made N/A    Medical Referrals Made N/A    Medical Appointment Completed N/A    CNP Interventions Advocate/Support;Educate;Counsel;Case Management    Screenings CN Performed N/A    ED Visit Averted N/A    Life-Saving Intervention Made N/A

## 2023-07-07 DIAGNOSIS — E1110 Type 2 diabetes mellitus with ketoacidosis without coma: Secondary | ICD-10-CM

## 2023-07-07 LAB — GLUCOSE, POCT (MANUAL RESULT ENTRY): POC Glucose: 219 mg/dL — AB (ref 70–99)

## 2023-07-07 NOTE — Congregational Nurse Program (Signed)
  Dept: [phone]   Congregational Nurse Program Note  Date of Encounter: 07/07/2023  Clinic visit to have assistance with Trulicity injection.  Due to disability with her fingers has difficulty managing the Trulicity syringe.  Injection of Trulicity 0.75 mg subcutaneously in left lower quadrant of abdomen.  Stated that her blood glucose was 52 this AM and that she has been drinking colas to keep it up and only ate [place] fries and past of a chicken biscuit today.  Educated regarding maintaining a more level blood glucose instead of extreme lows and highs.  Past Medical History: Past Medical History:  Diagnosis Date   Diabetes mellitus without complication (HCC)    DiGeorge syndrome (HCC)    Ectrodactyly    Genetic defect    genetic mutation of unknown significance per [facility] genetic notes   GERD (gastroesophageal reflux disease)    Hypercholesterolemia    Hypertension    Intellectual disability    Microcephaly (HCC)    Pelvic mass    Ventral hernia     Encounter Details:  Community Questionnaire - 07/07/23 1220       Questionnaire   Ask client: Do you give verbal consent for me to treat you today? Yes    Student Assistance N/A    [facility] Patient Served  [facility]    Encounter Setting CN site    Population Status Unknown   Has apartment at [facility]    Insurance/Financial Assistance Referral N/A    Medication Have Medication Insecurities;Patient Medications Reviewed;Provided Medication Assistance    Medical Provider Yes    Screening Referrals Made N/A    Medical Referrals Made N/A    Medical Appointment Completed N/A    CNP Interventions Advocate/Support;Educate;Counsel;Case Management    Screenings CN Performed Blood Glucose    ED Visit Averted N/A    Life-Saving Intervention Made N/A

## 2023-07-07 NOTE — Congregational Nurse Program (Signed)
  Dept: [phone]   Congregational Nurse Program Note  Date of Encounter: 07/07/2023  Clinic visit to check blood glucose prior to dinner and taking evening insulin.  Blood glucose 219 after late lunch at approximately 2:30P.  Had low blood glucose this AM of 52 after very high evening glucose on 07/06/2023 of 300 per her report.  Had eaten pizza for dinner.  Reviewed necessity of keeping blood glucose more level rather than the very low and high levels. Resident able to verbalize that she should keep blood glucose between 70 and the 150 range but doesn't appear to relate this to what she is eating.  Left message for PCP of blood glucose levels. Past Medical History: Past Medical History:  Diagnosis Date   Diabetes mellitus without complication (HCC)    DiGeorge syndrome (HCC)    Ectrodactyly    Genetic defect    genetic mutation of unknown significance per [facility] genetic notes   GERD (gastroesophageal reflux disease)    Hypercholesterolemia    Hypertension    Intellectual disability    Microcephaly (HCC)    Pelvic mass    Ventral hernia     Encounter Details:  Community Questionnaire - 07/07/23 1645       Questionnaire   Ask client: Do you give verbal consent for me to treat you today? Yes    Student Assistance N/A    [place]    Encounter Setting CN site    Population Status Unknown   Has apartment at [facility]    Insurance/Financial Assistance Referral N/A    Medication Have Medication Insecurities;Patient Medications Reviewed    Medical Provider Yes    Screening Referrals Made N/A    Medical Referrals Made Non-[facility] PCP/Clinic    Medical Appointment Completed N/A    CNP Interventions Advocate/Support;Educate;Counsel;Case Management    Screenings CN Performed Blood Glucose    ED Visit Averted N/A    Life-Saving Intervention Made N/A

## 2023-07-14 DIAGNOSIS — E1110 Type 2 diabetes mellitus with ketoacidosis without coma: Secondary | ICD-10-CM

## 2023-07-14 LAB — GLUCOSE, POCT (MANUAL RESULT ENTRY): POC Glucose: 236 mg/dL — AB (ref 70–99)

## 2023-07-14 NOTE — Congregational Nurse Program (Signed)
  Dept: [phone]   Congregational Nurse Program Note  Date of Encounter: 07/14/2023  Clinic visit to have assistance with Trulicity injection.  States blood glucose was 252 this AM but she didn't take Insulin because she was not eating before leaving home.  Had not eaten or had anything to drink today, drank 20oz of water at clinic, blood glucose 236.  Reviewed times that insulin should be taken and need to eat meals on a regular basis. Assisted with Trulicity 0.75mg  injection in left lower quadrant of abdomen. Past Medical History: Past Medical History:  Diagnosis Date   Diabetes mellitus without complication (HCC)    DiGeorge syndrome (HCC)    Ectrodactyly    Genetic defect    genetic mutation of unknown significance per [facility] genetic notes   GERD (gastroesophageal reflux disease)    Hypercholesterolemia    Hypertension    Intellectual disability    Microcephaly (HCC)    Pelvic mass    Ventral hernia     Encounter Details:  Community Questionnaire - 07/14/23 1135       Questionnaire   Ask client: Do you give verbal consent for me to treat you today? Yes    Student Assistance N/A    Location Patient Served  [facility]    Encounter Setting CN site    Population Status Unknown   Has apartment at [facility]    Insurance/Financial Assistance Referral N/A    Medication Have Medication Insecurities;Patient Medications Reviewed;Provided Medication Assistance    Medical Provider Yes    Screening Referrals Made N/A    Medical Referrals Made N/A    Medical Appointment Completed N/A    CNP Interventions Advocate/Support;Educate;Counsel;Case Management    Screenings CN Performed Blood Glucose    ED Visit Averted N/A    Life-Saving Intervention Made N/A

## 2023-07-21 NOTE — Congregational Nurse Program (Signed)
  Dept: [phone]   Congregational Nurse Program Note  Date of Encounter: 07/21/2023  Clinic visit to assist with Trulicity injection.  Reviewed blood glucose results per her report.  States she has not had any low blood glucose levels for past weeks.  Trulicity 0.75 mg given with assistance left lower quadrant of abdomen.    Discussed recent death of her husband and her concerns of not being able to speak to him before he died since they lived in separate locations.  Past Medical History: Past Medical History:  Diagnosis Date   Diabetes mellitus without complication (HCC)    DiGeorge syndrome (HCC)    Ectrodactyly    Genetic defect    genetic mutation of unknown significance per [facility] genetic notes   GERD (gastroesophageal reflux disease)    Hypercholesterolemia    Hypertension    Intellectual disability    Microcephaly (HCC)    Pelvic mass    Ventral hernia     Encounter Details:  Community Questionnaire - 07/21/23 1015       Questionnaire   Ask client: Do you give verbal consent for me to treat you today? Yes    Student Assistance N/A    [facility] Patient Served  [facility]    Encounter Setting CN site    Population Status Unknown   Has apartment at [facility]    Insurance/Financial Assistance Referral N/A    Medication Have Medication Insecurities;Patient Medications Reviewed;Provided Medication Assistance    Medical Provider Yes    Screening Referrals Made N/A    Medical Referrals Made N/A    Medical Appointment Completed N/A    CNP Interventions Advocate/Support;Educate;Counsel;Case Management;Spiritual Care    Screenings CN Performed N/A    ED Visit Averted N/A    Life-Saving Intervention Made N/A

## 2023-07-28 NOTE — Congregational Nurse Program (Signed)
  Dept: [phone]   Congregational Nurse Program Note  Date of Encounter: 07/28/2023  Clinic visit to have assistance with Trulicity injection.  States blood glucose 223 this AM, didn't take insulin  or eat anything. Given 20 oz bottle of water after checking weight, 102 lbs current weight.  Discussed eating regular meals and eating more fruits and vegetables.  Discussed recent death of her husband, cause of death not yet determined. Past Medical History: Past Medical History:  Diagnosis Date   Diabetes mellitus without complication (HCC)    DiGeorge syndrome (HCC)    Ectrodactyly    Genetic defect    genetic mutation of unknown significance per [facility] genetic notes   GERD (gastroesophageal reflux disease)    Hypercholesterolemia    Hypertension    Intellectual disability    Microcephaly (HCC)    Pelvic mass    Ventral hernia     Encounter Details:  Community Questionnaire - 07/28/23 1035       Questionnaire   Ask client: Do you give verbal consent for me to treat you today? Yes    Student Assistance N/A    Location Patient Served  [facility]    Encounter Setting CN site    Population Status Unknown   Has apartment at [facility]    Insurance/Financial Assistance Referral N/A    Medication Have Medication Insecurities;Patient Medications Reviewed;Provided Medication Assistance    Medical Provider Yes    Screening Referrals Made N/A    Medical Referrals Made N/A    Medical Appointment Completed N/A    CNP Interventions Advocate/Support;Educate;Counsel;Case Management;Spiritual Care    Screenings CN Performed Weight    ED Visit Averted N/A    Life-Saving Intervention Made N/A

## 2023-08-04 DIAGNOSIS — E1110 Type 2 diabetes mellitus with ketoacidosis without coma: Secondary | ICD-10-CM

## 2023-08-04 LAB — GLUCOSE, POCT (MANUAL RESULT ENTRY): POC Glucose: 275 mg/dL — AB (ref 70–99)

## 2023-08-04 NOTE — Congregational Nurse Program (Signed)
  Dept: [phone]   Congregational Nurse Program Note  Date of Encounter: 08/04/2023  Came to [facility] clinic, had not eaten breakfast or taken AM insulin .  Blood glucose 275, given bottle of water, counseled regarding not leaving her apartment when blood glucose is high. Sent home via bus to take insulin . Notified MD office of high blood glucose results this month. Past Medical History: Past Medical History:  Diagnosis Date   Diabetes mellitus without complication (HCC)    DiGeorge syndrome (HCC)    Ectrodactyly    Genetic defect    genetic mutation of unknown significance per [facility] genetic notes   GERD (gastroesophageal reflux disease)    Hypercholesterolemia    Hypertension    Intellectual disability    Microcephaly (HCC)    Pelvic mass    Ventral hernia     Encounter Details:  Community Questionnaire - 08/04/23 1045       Questionnaire   Ask client: Do you give verbal consent for me to treat you today? Yes    Student Assistance N/A    Location Patient Served  [facility]    Encounter Setting CN site    Population Status Unknown   Has apartment at [facility]    Insurance/Financial Assistance Referral N/A    Medication Have Medication Insecurities;Patient Medications Reviewed;Provided Medication Assistance    Medical Provider Yes    Screening Referrals Made N/A    Medical Referrals Made N/A    Medical Appointment Completed N/A    CNP Interventions Advocate/Support;Educate;Counsel;Case Management;Spiritual Care    Screenings CN Performed Blood Glucose    ED Visit Averted Yes    Life-Saving Intervention Made N/A

## 2023-08-13 NOTE — Congregational Nurse Program (Signed)
  Dept: [phone]   Congregational Nurse Program Note  Date of Encounter: 08/11/2023  Clinic visit to assist with Trulicity injection.States that her blood glucose was 40 this AM, first time low in about a month.  Reviewed intake for the evening before the low result and times insulin  was taken.  Per report had eaten very little dinner but had taken usual amount of insulin .  Reminded to eat a complete meal for dinner or have a snack before bed.  Discussed recent death of her husband and lack of contact with his siblings. Past Medical History: Past Medical History:  Diagnosis Date   Diabetes mellitus without complication (HCC)    DiGeorge syndrome (HCC)    Ectrodactyly    Genetic defect    genetic mutation of unknown significance per [facility] genetic notes   GERD (gastroesophageal reflux disease)    Hypercholesterolemia    Hypertension    Intellectual disability    Microcephaly (HCC)    Pelvic mass    Ventral hernia     Encounter Details:  Community Questionnaire - 08/11/23 1130       Questionnaire   Ask client: Do you give verbal consent for me to treat you today? Yes    Student Assistance N/A    Location Patient Served  [facility]    Encounter Setting CN site    Population Status Unknown   Has apartment at [facility]    Insurance/Financial Assistance Referral N/A    Medication Have Medication Insecurities;Patient Medications Reviewed;Provided Medication Assistance    Medical Provider Yes    Screening Referrals Made N/A    Medical Referrals Made N/A    Medical Appointment Completed N/A    CNP Interventions Advocate/Support;Counsel;Case Management    Screenings CN Performed N/A    ED Visit Averted N/A    Life-Saving Intervention Made N/A

## 2023-08-18 DIAGNOSIS — E1110 Type 2 diabetes mellitus with ketoacidosis without coma: Secondary | ICD-10-CM

## 2023-08-18 LAB — GLUCOSE, POCT (MANUAL RESULT ENTRY): POC Glucose: 172 mg/dL — AB (ref 70–99)

## 2023-08-18 NOTE — Congregational Nurse Program (Unsigned)
  Dept: [phone]   Congregational Nurse Program Note  Date of Encounter: 08/18/2023  Clinic visit to check blood glucose and have assistance with Trulicity injection.  Stated that she had a low of 49 this morning but has not checked blood glucose again today, ate candy and drank water.  Did not eat anything else until lunch.  Discussed need to eat a meal or substantial amount after a low blood glucose not just that high sugar food.  Assisted her with making arrangements to obtain her late husband's belongings from the apartment where he resided.  Past Medical History: Past Medical History:  Diagnosis Date   Diabetes mellitus without complication (HCC)    DiGeorge syndrome (HCC)    Ectrodactyly    Genetic defect    genetic mutation of unknown significance per [facility] genetic notes   GERD (gastroesophageal reflux disease)    Hypercholesterolemia    Hypertension    Intellectual disability    Microcephaly (HCC)    Pelvic mass    Ventral hernia     Encounter Details:  Community Questionnaire - 08/18/23 1610       Questionnaire   Ask client: Do you give verbal consent for me to treat you today? Yes    Student Assistance N/A    Location Patient Served  [facility]    Encounter Setting CN site    Population Status Unknown   Has apartment at [facility]    Insurance/Financial Assistance Referral N/A    Medication Have Medication Insecurities;Patient Medications Reviewed;Provided Medication Assistance    Medical Provider Yes    Screening Referrals Made N/A    Medical Referrals Made N/A    Medical Appointment Completed N/A    CNP Interventions Advocate/Support;Counsel;Case Management;Spiritual Care    Screenings CN Performed Blood Pressure;Blood Glucose    ED Visit Averted N/A    Life-Saving Intervention Made N/A

## 2023-08-25 NOTE — Congregational Nurse Program (Signed)
  Dept: [phone]   Congregational Nurse Program Note  Date of Encounter: 08/25/2023  Clinic visit to obtain assistance with Trulicity injection.Noted to be anxious, twisting braids and looking down while speaking.  Began to cry when discussing her late husband, expressed fear of dying alone in her apartment.  Discussed ways to manage grief and need to manage diabetes to prevent low blood glucose levels.  Assisted with Trulicity 0.75 mg left lower quadrant of abdomen.  Referred to case manager at [facility] for appropriate counseling recommendations.  Past Medical History: Past Medical History:  Diagnosis Date   Diabetes mellitus without complication (HCC)    DiGeorge syndrome (HCC)    Ectrodactyly    Genetic defect    genetic mutation of unknown significance per [facility] genetic notes   GERD (gastroesophageal reflux disease)    Hypercholesterolemia    Hypertension    Intellectual disability    Microcephaly (HCC)    Pelvic mass    Ventral hernia     Encounter Details:  Community Questionnaire - 08/25/23 1055       Questionnaire   Ask client: Do you give verbal consent for me to treat you today? Yes    Student Assistance N/A    Location Patient Served  [facility]    Encounter Setting CN site    Population Status Unknown   Has apartment at [facility]    Insurance/Financial Assistance Referral N/A    Medication Have Medication Insecurities;Patient Medications Reviewed;Provided Medication Assistance    Medical Provider Yes    Screening Referrals Made N/A    Medical Referrals Made N/A    Medical Appointment Completed N/A    CNP Interventions Advocate/Support;Counsel;Case Management;Spiritual Care;[facility]    Screenings CN Performed N/A    ED Visit Averted N/A    Life-Saving Intervention Made N/A

## 2023-08-26 ENCOUNTER — Ambulatory Visit (INDEPENDENT_AMBULATORY_CARE_PROVIDER_SITE_OTHER): Payer: Medicaid Other | Admitting: Podiatry

## 2023-08-26 DIAGNOSIS — M79674 Pain in right toe(s): Secondary | ICD-10-CM | POA: Diagnosis not present

## 2023-08-26 DIAGNOSIS — E1142 Type 2 diabetes mellitus with diabetic polyneuropathy: Secondary | ICD-10-CM | POA: Diagnosis not present

## 2023-08-26 DIAGNOSIS — B351 Tinea unguium: Secondary | ICD-10-CM

## 2023-08-26 DIAGNOSIS — M79675 Pain in left toe(s): Secondary | ICD-10-CM | POA: Diagnosis not present

## 2023-08-26 NOTE — Progress Notes (Unsigned)
  Subjective:  Patient ID: [name], female    DOB: [birth year],  MRN: [id number]  [name] presents to clinic today for at risk foot care. Patient has h/o diabetes with history of ulceration of left ankle, left heel, and right heel and painful elongated mycotic toenails 1-5 bilaterally which are tender when wearing enclosed shoe gear. Pain is relieved with periodic professional debridement. Patient relates she lost her husband since her last visit. Chief Complaint  Patient presents with   Nail Problem    RFC   New problem(s): None.   PCP is Benn Brash, PA-C.   No Known Allergies  Review of Systems: Negative except as noted in the HPI.  Objective: No changes noted in today's physical examination. There were no vitals filed for this visit. [name] is a pleasant 48 y.o. female thin build in NAD. AAO x 3.  Vascular Examination: Vascular status intact b/l with faintly palpable pedal pulses. CFT immediate b/l. No edema. No pain with calf compression b/l. Skin temperature gradient WNL b/l. No ischemia or gangrene noted b/l LE. No cyanosis or clubbing noted b/l LE.  Neurological Examination: Sensation grossly intact b/l with 10 gram monofilament. Vibratory sensation intact b/l. Pt has subjective symptoms of neuropathy.  Dermatological Examination: Pedal skin with normal turgor, texture and tone b/l. Toenails 1-5 b/l thick, discolored, elongated with subungual debris and pain on dorsal palpation. No hyperkeratotic lesions noted b/l.   Musculoskeletal Examination: Muscle strength 5/5 to b/l LE. HAV with bunion bilaterally and hammertoes 2-5 b/l.  Radiographs: None  Assessment/Plan: 1. Pain due to onychomycosis of toenails of both feet   2. Diabetic peripheral neuropathy associated with type 2 diabetes mellitus (HCC)   Extended my condolences to [name] on the loss of her husband. Patient was evaluated and treated. All patient's and/or POA's  questions/concerns addressed on today's visit. Toenails 1-5 debrided in length and girth without incident. Continue foot and shoe inspections daily. Monitor blood glucose per PCP/Endocrinologist's recommendations. Continue soft, supportive shoe gear daily. Report any pedal injuries to medical professional. Call office if there are any questions/concerns. -Patient/POA to call should there be question/concern in the interim.   Return in about 3 months (around 11/26/2023).  [name], DPM      [place] LOCATION: [address], [place] [address]                   Office [phone]   [facility] LOCATION: [address] [place], [place] [address] Office [phone]

## 2023-08-31 ENCOUNTER — Encounter: Payer: Self-pay | Admitting: Podiatry

## 2023-09-01 DIAGNOSIS — E1110 Type 2 diabetes mellitus with ketoacidosis without coma: Secondary | ICD-10-CM

## 2023-09-01 LAB — GLUCOSE, POCT (MANUAL RESULT ENTRY): POC Glucose: 191 mg/dL — AB (ref 70–99)

## 2023-09-08 NOTE — Congregational Nurse Program (Signed)
  Dept: [phone]   Congregational Nurse Program Note  Date of Encounter: 09/01/2023  Clinic visit to have assistance with Trulicity injection. Continues to express concern regarding recent death of her husband. Referred to the counseling service offered by [facility].  Blood glucose 191, discussed meals for past three days and potential complications of diabetes when blood glucose levels are not within control.  Assisted with Trulicity injections left lower quadrant of abdomen.  Past Medical History: Past Medical History:  Diagnosis Date   Diabetes mellitus without complication (HCC)    DiGeorge syndrome (HCC)    Ectrodactyly    Genetic defect    genetic mutation of unknown significance per [facility] genetic notes   GERD (gastroesophageal reflux disease)    Hypercholesterolemia    Hypertension    Intellectual disability    Microcephaly (HCC)    Pelvic mass    Ventral hernia     Encounter Details:  Community Questionnaire - 09/01/23 1707       Questionnaire   Ask client: Do you give verbal consent for me to treat you today? Yes    Student Assistance N/A    Location Patient [place]    Encounter Setting CN site    Population Status Unknown   Has apartment at [facility]    Insurance/Financial Assistance Referral N/A    Medication Have Medication Insecurities;Patient Medications Reviewed;Provided Medication Assistance    Medical Provider Yes    Screening Referrals Made N/A    Medical Referrals Made N/A    Medical Appointment Completed N/A    CNP Interventions Advocate/Support;Counsel;Case Management;Spiritual Care    Screenings CN Performed Blood Glucose    ED Visit Averted N/A    Life-Saving Intervention Made N/A

## 2023-09-08 NOTE — Congregational Nurse Program (Signed)
  Dept: [phone]   Congregational Nurse Program Note  Date of Encounter: 09/08/2023  Clinic visit to assist with Trulicity injection.  States blood glucose was about 200 this morning and that she has not had any water today.  Continues to ask questions regarding her husband's death.  Referred to the pastoral counselor at [facility] to meet with after clinic visit.  Assisted with Trulicity injection left lower quadrant of abdomen. Past Medical History: Past Medical History:  Diagnosis Date   Diabetes mellitus without complication (HCC)    DiGeorge syndrome (HCC)    Ectrodactyly    Genetic defect    genetic mutation of unknown significance per [facility] genetic notes   GERD (gastroesophageal reflux disease)    Hypercholesterolemia    Hypertension    Intellectual disability    Microcephaly (HCC)    Pelvic mass    Ventral hernia     Encounter Details:

## 2023-09-15 NOTE — Congregational Nurse Program (Signed)
 Dept: [phone]   Congregational Nurse Program Note  Date of Encounter: 09/15/2023  Past Medical History: Past Medical History:  Diagnosis Date   Diabetes mellitus without complication (HCC)    DiGeorge syndrome (HCC)    Ectrodactyly    Genetic defect    genetic mutation of unknown significance per [facility] genetic notes   GERD (gastroesophageal reflux disease)    Hypercholesterolemia    Hypertension    Intellectual disability    Microcephaly (HCC)    Pelvic mass    Ventral hernia     Encounter Details:  Community Questionnaire - 09/15/23 1000       Questionnaire   Ask client: Do you give verbal consent for me to treat you today? Yes    Student Assistance N/A    Location Patient Served  [facility]    Encounter Setting CN site    Population Status Unknown   Has apartment at [facility]    Insurance/Financial Assistance Referral N/A    Medication Have Medication Insecurities;Patient Medications Reviewed;Provided Medication Assistance    Medical Provider Yes    Screening Referrals Made N/A    Medical Referrals Made N/A    Medical Appointment Completed N/A    CNP Interventions Advocate/Support;Counsel;Case Management;Spiritual Care    Screenings CN Performed N/A    ED Visit Averted N/A    Life-Saving Intervention Made N/A               Dept: [phone]   Congregational Nurse Program Note  Date of Encounter: 09/15/2023  Past Medical History: Past Medical History:  Diagnosis Date   Diabetes mellitus without complication (HCC)    DiGeorge syndrome (HCC)    Ectrodactyly    Genetic defect    genetic mutation of unknown significance per [facility] genetic notes   GERD (gastroesophageal reflux disease)    Hypercholesterolemia    Hypertension    Intellectual disability    Microcephaly (HCC)    Pelvic mass    Ventral hernia     Encounter Details:  Community Questionnaire - 09/15/23 1000       Questionnaire   Ask client: Do you  give verbal consent for me to treat you today? Yes    Student Assistance N/A    Location Patient Served  [facility]    Encounter Setting CN site    Population Status Unknown   Has apartment at [facility]    Insurance/Financial Assistance Referral N/A    Medication Have Medication Insecurities;Patient Medications Reviewed;Provided Medication Assistance    Medical Provider Yes    Screening Referrals Made N/A    Medical Referrals Made N/A    Medical Appointment Completed N/A    CNP Interventions Advocate/Support;Counsel;Case Management;Spiritual Care    Screenings CN Performed N/A    ED Visit Averted N/A    Life-Saving Intervention Made N/A               Dept: [phone]   Congregational Nurse Program Note  Date of Encounter: 09/15/2023  Past Medical History: Past Medical History:  Diagnosis Date   Diabetes mellitus without complication (HCC)    DiGeorge syndrome (HCC)    Ectrodactyly    Genetic defect    genetic mutation of unknown significance per [facility] genetic notes   GERD (gastroesophageal reflux disease)    Hypercholesterolemia    Hypertension    Intellectual disability    Microcephaly (HCC)    Pelvic mass    Ventral hernia  Encounter Details:  Community Questionnaire - 09/15/23 1000       Questionnaire   Ask client: Do you give verbal consent for me to treat you today? Yes    Student Assistance N/A    Location Patient Served  [facility]    Encounter Setting CN site    Population Status Unknown   Has apartment at [facility]    Insurance/Financial Assistance Referral N/A    Medication Have Medication Insecurities;Patient Medications Reviewed;Provided Medication Assistance    Medical Provider Yes    Screening Referrals Made N/A    Medical Referrals Made N/A    Medical Appointment Completed N/A    CNP Interventions Advocate/Support;Counsel;Case Management;Spiritual Care    Screenings CN Performed N/A     ED Visit Averted N/A    Life-Saving Intervention Made N/A               Dept: [phone]   Congregational Nurse Program Note  Date of Encounter: 09/15/2023  Clinic visit to assist with Trulicity injection.  Called PCP office to verify Trulicity dosage increase to 1.5 mg weekly.  Assisted with injection in left lower quadrant of abdomen.  Stated that she is to take 16 units of Lantus  each evening but is unsure if she is taking correct amount with the syringes she was given. Blood glucose 265 AC lunch, states she had not taken any insulin .  Seen in the afternoon at [facility] at 1610 to review correct insulin  dosage with regular 100 unit insulin  syringe.  Past Medical History: Past Medical History:  Diagnosis Date   Diabetes mellitus without complication (HCC)    DiGeorge syndrome (HCC)    Ectrodactyly    Genetic defect    genetic mutation of unknown significance per [facility] genetic notes   GERD (gastroesophageal reflux disease)    Hypercholesterolemia    Hypertension    Intellectual disability    Microcephaly (HCC)    Pelvic mass    Ventral hernia     Encounter Details:  Community Questionnaire - 09/15/23 1000       Questionnaire   Ask client: Do you give verbal consent for me to treat you today? Yes    Student Assistance N/A    Location Patient Served  [facility]    Encounter Setting CN site    Population Status Unknown   Has apartment at [facility]    Insurance/Financial Assistance Referral N/A    Medication Have Medication Insecurities;Patient Medications Reviewed;Provided Medication Assistance    Medical Provider Yes    Screening Referrals Made N/A    Medical Referrals Made N/A    Medical Appointment Completed N/A    CNP Interventions Advocate/Support;Counsel;Case Management;Spiritual Care    Screenings CN Performed N/A    ED Visit Averted N/A    Life-Saving Intervention Made N/A

## 2023-09-22 NOTE — Congregational Nurse Program (Signed)
  Dept: [phone]   Congregational Nurse Program Note  Date of Encounter: 09/22/2023   Clinic visit to assist with Trulicity injection.  States blood glucose this AM 142 prior to breakfast.  Had not taken Lantus  this morning, took Lantus  prior to dinner on 6/16 and states she will take it this evening.  Trulicity 1.5 mg given in left lower of abdomen.  Past Medical History: Past Medical History:  Diagnosis Date   Diabetes mellitus without complication (HCC)    DiGeorge syndrome (HCC)    Ectrodactyly    Genetic defect    genetic mutation of unknown significance per [facility] genetic notes   GERD (gastroesophageal reflux disease)    Hypercholesterolemia    Hypertension    Intellectual disability    Microcephaly (HCC)    Pelvic mass    Ventral hernia     Encounter Details:  Community Questionnaire - 09/22/23 1710       Questionnaire   Ask client: Do you give verbal consent for me to treat you today? Yes    Student Assistance N/A    Location Patient [place]    Encounter Setting CN site    Population Status Unknown   Has apartment at [facility]    Insurance/Financial Assistance Referral N/A    Medication Have Medication Insecurities;Patient Medications Reviewed;Provided Medication Assistance    Medical Provider Yes    Screening Referrals Made N/A    Medical Referrals Made N/A    Medical Appointment Completed N/A    CNP Interventions Advocate/Support;Counsel;Case Management;Spiritual Care    Screenings CN Performed N/A    ED Visit Averted N/A    Life-Saving Intervention Made N/A

## 2023-09-23 ENCOUNTER — Encounter (HOSPITAL_COMMUNITY): Payer: Self-pay | Admitting: Emergency Medicine

## 2023-09-23 ENCOUNTER — Inpatient Hospital Stay (HOSPITAL_COMMUNITY)
Admission: EM | Admit: 2023-09-23 | Discharge: 2023-09-28 | DRG: 660 | Disposition: A | Discharge status: Home / Self Care | Attending: Internal Medicine | Admitting: Internal Medicine

## 2023-09-23 DIAGNOSIS — I10 Essential (primary) hypertension: Secondary | ICD-10-CM | POA: Diagnosis present

## 2023-09-23 DIAGNOSIS — Z79899 Other long term (current) drug therapy: Secondary | ICD-10-CM

## 2023-09-23 DIAGNOSIS — N136 Pyonephrosis: Principal | ICD-10-CM | POA: Diagnosis present

## 2023-09-23 DIAGNOSIS — Z7982 Long term (current) use of aspirin: Secondary | ICD-10-CM

## 2023-09-23 DIAGNOSIS — E872 Acidosis, unspecified: Secondary | ICD-10-CM | POA: Diagnosis present

## 2023-09-23 DIAGNOSIS — K219 Gastro-esophageal reflux disease without esophagitis: Secondary | ICD-10-CM | POA: Diagnosis present

## 2023-09-23 DIAGNOSIS — E43 Unspecified severe protein-calorie malnutrition: Secondary | ICD-10-CM | POA: Diagnosis present

## 2023-09-23 DIAGNOSIS — Z794 Long term (current) use of insulin: Secondary | ICD-10-CM

## 2023-09-23 DIAGNOSIS — E7800 Pure hypercholesterolemia, unspecified: Secondary | ICD-10-CM | POA: Diagnosis present

## 2023-09-23 DIAGNOSIS — E785 Hyperlipidemia, unspecified: Secondary | ICD-10-CM | POA: Insufficient documentation

## 2023-09-23 DIAGNOSIS — Z6821 Body mass index (BMI) 21.0-21.9, adult: Secondary | ICD-10-CM

## 2023-09-23 DIAGNOSIS — N390 Urinary tract infection, site not specified: Principal | ICD-10-CM

## 2023-09-23 DIAGNOSIS — N12 Tubulo-interstitial nephritis, not specified as acute or chronic: Secondary | ICD-10-CM | POA: Diagnosis present

## 2023-09-23 DIAGNOSIS — D821 Di George's syndrome: Secondary | ICD-10-CM | POA: Diagnosis present

## 2023-09-23 DIAGNOSIS — E119 Type 2 diabetes mellitus without complications: Secondary | ICD-10-CM

## 2023-09-23 DIAGNOSIS — E1165 Type 2 diabetes mellitus with hyperglycemia: Secondary | ICD-10-CM | POA: Diagnosis present

## 2023-09-23 DIAGNOSIS — D259 Leiomyoma of uterus, unspecified: Secondary | ICD-10-CM | POA: Diagnosis present

## 2023-09-23 DIAGNOSIS — Z9049 Acquired absence of other specified parts of digestive tract: Secondary | ICD-10-CM

## 2023-09-23 DIAGNOSIS — E46 Unspecified protein-calorie malnutrition: Secondary | ICD-10-CM | POA: Diagnosis present

## 2023-09-23 DIAGNOSIS — F79 Unspecified intellectual disabilities: Secondary | ICD-10-CM | POA: Diagnosis present

## 2023-09-23 DIAGNOSIS — Z833 Family history of diabetes mellitus: Secondary | ICD-10-CM

## 2023-09-23 DIAGNOSIS — N1330 Unspecified hydronephrosis: Secondary | ICD-10-CM | POA: Insufficient documentation

## 2023-09-23 LAB — COMPREHENSIVE METABOLIC PANEL WITH GFR
ALT: 24 U/L (ref 0–44)
AST: 26 U/L (ref 15–41)
Albumin: 3.8 g/dL (ref 3.5–5.0)
Alkaline Phosphatase: 90 U/L (ref 38–126)
Anion gap: 13 (ref 5–15)
BUN: 15 mg/dL (ref 6–20)
CO2: 20 mmol/L — ABNORMAL LOW (ref 22–32)
Calcium: 8.9 mg/dL (ref 8.9–10.3)
Chloride: 102 mmol/L (ref 98–111)
Creatinine, Ser: 0.74 mg/dL (ref 0.44–1.00)
GFR, Estimated: >60 mL/min (ref >60)
Glucose, Bld: 72 mg/dL (ref 70–99)
Potassium: 3.9 mmol/L (ref 3.5–5.1)
Sodium: 135 mmol/L (ref 135–145)
Total Bilirubin: 1.2 mg/dL (ref 0.0–1.2)
Total Protein: 8.4 g/dL — ABNORMAL HIGH (ref 6.5–8.1)

## 2023-09-23 LAB — CBC
HCT: 42.9 % (ref 36.0–46.0)
Hemoglobin: 14 g/dL (ref 12.0–15.0)
MCH: 28 pg (ref 26.0–34.0)
MCHC: 32.6 g/dL (ref 30.0–36.0)
MCV: 85.8 fL (ref 80.0–100.0)
Platelets: 349 10*3/uL (ref 150–400)
RBC: 5 MIL/uL (ref 3.87–5.11)
RDW: 12.3 % (ref 11.5–15.5)
WBC: 7.4 10*3/uL (ref 4.0–10.5)
nRBC: 0 % (ref 0.0–0.2)

## 2023-09-23 LAB — LIPASE, BLOOD: Lipase: 25 U/L (ref 11–51)

## 2023-09-23 MED ORDER — ONDANSETRON HCL 4 MG/2ML IJ SOLN
4.0000 mg | Freq: Once | INTRAMUSCULAR | Status: AC
  Administered 2023-09-23: 4 mg via INTRAVENOUS
  Filled 2023-09-23: qty 2

## 2023-09-23 NOTE — ED Triage Notes (Signed)
 Pt here from home with llq abd pain and nausea that has been ongoing for 2 weeks

## 2023-09-23 NOTE — ED Provider Triage Note (Signed)
 Emergency Medicine Provider Triage Evaluation Note  [name] , a 48 y.o. female  was evaluated in triage.  Pt complains of abdominal pain.  Review of Systems  Positive: Abdominal pain nausea Negative: Chest pain shortness of breath  Physical Exam  BP 137/80 (BP Location: Right Arm)   Pulse (!) 110   Temp 98.1 F (36.7 C) (Oral)   Resp 18   SpO2 100%  Gen:   Awake, no distress   Resp:  Normal effort  MSK:   Moves extremities without difficulty Other:  Tenderness in the upper abdomen  Medical Decision Making  Medically screening exam initiated at 7:56 PM.  Appropriate orders placed.  [name] was informed that the remainder of the evaluation will be completed by another provider, this initial triage assessment does not replace that evaluation, and the importance of remaining in the ED until their evaluation is complete.     Lowery Rue, DO 09/23/23 1956

## 2023-09-24 ENCOUNTER — Emergency Department (HOSPITAL_COMMUNITY)

## 2023-09-24 ENCOUNTER — Inpatient Hospital Stay (HOSPITAL_COMMUNITY): Admitting: Anesthesiology

## 2023-09-24 ENCOUNTER — Inpatient Hospital Stay (HOSPITAL_COMMUNITY)

## 2023-09-24 ENCOUNTER — Encounter (HOSPITAL_COMMUNITY)
Admission: EM | Disposition: A | Discharge status: Home / Self Care | Payer: Self-pay | Source: Home / Self Care | Attending: Internal Medicine

## 2023-09-24 ENCOUNTER — Encounter (HOSPITAL_COMMUNITY): Payer: Self-pay | Admitting: Hospitalist

## 2023-09-24 ENCOUNTER — Other Ambulatory Visit: Payer: Self-pay

## 2023-09-24 DIAGNOSIS — E7800 Pure hypercholesterolemia, unspecified: Secondary | ICD-10-CM | POA: Diagnosis present

## 2023-09-24 DIAGNOSIS — R109 Unspecified abdominal pain: Secondary | ICD-10-CM | POA: Diagnosis present

## 2023-09-24 DIAGNOSIS — N390 Urinary tract infection, site not specified: Secondary | ICD-10-CM | POA: Diagnosis not present

## 2023-09-24 DIAGNOSIS — E872 Acidosis, unspecified: Secondary | ICD-10-CM | POA: Diagnosis present

## 2023-09-24 DIAGNOSIS — N1330 Unspecified hydronephrosis: Secondary | ICD-10-CM | POA: Diagnosis not present

## 2023-09-24 DIAGNOSIS — N12 Tubulo-interstitial nephritis, not specified as acute or chronic: Secondary | ICD-10-CM | POA: Diagnosis present

## 2023-09-24 DIAGNOSIS — F79 Unspecified intellectual disabilities: Secondary | ICD-10-CM | POA: Diagnosis present

## 2023-09-24 DIAGNOSIS — N201 Calculus of ureter: Secondary | ICD-10-CM | POA: Diagnosis not present

## 2023-09-24 DIAGNOSIS — K219 Gastro-esophageal reflux disease without esophagitis: Secondary | ICD-10-CM | POA: Diagnosis present

## 2023-09-24 DIAGNOSIS — I10 Essential (primary) hypertension: Secondary | ICD-10-CM | POA: Diagnosis present

## 2023-09-24 DIAGNOSIS — Z794 Long term (current) use of insulin: Secondary | ICD-10-CM | POA: Diagnosis not present

## 2023-09-24 DIAGNOSIS — Z7982 Long term (current) use of aspirin: Secondary | ICD-10-CM | POA: Diagnosis not present

## 2023-09-24 DIAGNOSIS — Z79899 Other long term (current) drug therapy: Secondary | ICD-10-CM | POA: Diagnosis not present

## 2023-09-24 DIAGNOSIS — D821 Di George's syndrome: Secondary | ICD-10-CM | POA: Diagnosis present

## 2023-09-24 DIAGNOSIS — Z9049 Acquired absence of other specified parts of digestive tract: Secondary | ICD-10-CM | POA: Diagnosis not present

## 2023-09-24 DIAGNOSIS — E119 Type 2 diabetes mellitus without complications: Secondary | ICD-10-CM

## 2023-09-24 DIAGNOSIS — N136 Pyonephrosis: Secondary | ICD-10-CM | POA: Diagnosis present

## 2023-09-24 DIAGNOSIS — E1165 Type 2 diabetes mellitus with hyperglycemia: Secondary | ICD-10-CM | POA: Diagnosis present

## 2023-09-24 DIAGNOSIS — Z833 Family history of diabetes mellitus: Secondary | ICD-10-CM | POA: Diagnosis not present

## 2023-09-24 DIAGNOSIS — E46 Unspecified protein-calorie malnutrition: Secondary | ICD-10-CM | POA: Diagnosis present

## 2023-09-24 DIAGNOSIS — D259 Leiomyoma of uterus, unspecified: Secondary | ICD-10-CM | POA: Diagnosis present

## 2023-09-24 DIAGNOSIS — Z6821 Body mass index (BMI) 21.0-21.9, adult: Secondary | ICD-10-CM | POA: Diagnosis not present

## 2023-09-24 HISTORY — PX: CYSTOSCOPY W/ URETERAL STENT PLACEMENT: SHX1429

## 2023-09-24 LAB — CBC
HCT: 40.2 % (ref 36.0–46.0)
Hemoglobin: 12.7 g/dL (ref 12.0–15.0)
MCH: 27.6 pg (ref 26.0–34.0)
MCHC: 31.6 g/dL (ref 30.0–36.0)
MCV: 87.4 fL (ref 80.0–100.0)
Platelets: 278 10*3/uL (ref 150–400)
RBC: 4.6 MIL/uL (ref 3.87–5.11)
RDW: 12.2 % (ref 11.5–15.5)
WBC: 10 10*3/uL (ref 4.0–10.5)
nRBC: 0 % (ref 0.0–0.2)

## 2023-09-24 LAB — URINALYSIS, ROUTINE W REFLEX MICROSCOPIC
Bilirubin Urine: NEGATIVE
Glucose, UA: NEGATIVE mg/dL
Ketones, ur: 20 mg/dL — AB
Nitrite: NEGATIVE
Protein, ur: 100 mg/dL — AB
Specific Gravity, Urine: 1.016 (ref 1.005–1.030)
WBC, UA: >50 WBC/hpf (ref 0–5)
pH: 6 (ref 5.0–8.0)

## 2023-09-24 LAB — GLUCOSE, CAPILLARY
Glucose-Capillary: 238 mg/dL — ABNORMAL HIGH (ref 70–99)
Glucose-Capillary: 212 mg/dL — ABNORMAL HIGH (ref 70–99)

## 2023-09-24 LAB — HIV ANTIBODY (ROUTINE TESTING W REFLEX): HIV Screen 4th Generation wRfx: NONREACTIVE

## 2023-09-24 LAB — PREGNANCY, URINE: Preg Test, Ur: NEGATIVE

## 2023-09-24 LAB — CBG MONITORING, ED: Glucose-Capillary: 187 mg/dL — ABNORMAL HIGH (ref 70–99)

## 2023-09-24 LAB — CREATININE, SERUM
Creatinine, Ser: 0.84 mg/dL (ref 0.44–1.00)
GFR, Estimated: >60 mL/min (ref >60)

## 2023-09-24 LAB — I-STAT CG4 LACTIC ACID, ED: Lactic Acid, Venous: 2.2 mmol/L (ref 0.5–1.9)

## 2023-09-24 SURGERY — CYSTOSCOPY, WITH RETROGRADE PYELOGRAM AND URETERAL STENT INSERTION
Anesthesia: General | Site: Urethra | Laterality: Left

## 2023-09-24 MED ORDER — SUGAMMADEX SODIUM 200 MG/2ML IV SOLN
INTRAVENOUS | Status: DC | PRN
  Administered 2023-09-24: 200 mg via INTRAVENOUS

## 2023-09-24 MED ORDER — LACTATED RINGERS IV SOLN: INTRAVENOUS | Status: DC

## 2023-09-24 MED ORDER — FENTANYL CITRATE (PF) 250 MCG/5ML IJ SOLN
INTRAMUSCULAR | Status: DC | PRN
  Administered 2023-09-24 (×2): 25 ug via INTRAVENOUS

## 2023-09-24 MED ORDER — OXYCODONE HCL 5 MG PO TABS: 5.0000 mg | ORAL_TABLET | Freq: Once | ORAL | Status: DC | PRN

## 2023-09-24 MED ORDER — ENOXAPARIN SODIUM 40 MG/0.4ML IJ SOSY
40.0000 mg | PREFILLED_SYRINGE | INTRAMUSCULAR | Status: DC
  Administered 2023-09-24 – 2023-09-27 (×4): 40 mg via SUBCUTANEOUS
  Filled 2023-09-24 (×5): qty 0.4

## 2023-09-24 MED ORDER — FENTANYL CITRATE (PF) 100 MCG/2ML IJ SOLN
INTRAMUSCULAR | Status: AC
Start: 2023-09-24 — End: 2023-09-24
  Filled 2023-09-24: qty 2

## 2023-09-24 MED ORDER — OXYCODONE HCL 5 MG/5ML PO SOLN: 5.0000 mg | Freq: Once | ORAL | Status: DC | PRN

## 2023-09-24 MED ORDER — KETOROLAC TROMETHAMINE 15 MG/ML IJ SOLN
INTRAMUSCULAR | Status: AC
  Filled 2023-09-24: qty 1

## 2023-09-24 MED ORDER — CHLORHEXIDINE GLUCONATE 0.12 % MT SOLN: 15.0000 mL | Freq: Once | OROMUCOSAL | Status: AC

## 2023-09-24 MED ORDER — PHENYLEPHRINE 80 MCG/ML (10ML) SYRINGE FOR IV PUSH (FOR BLOOD PRESSURE SUPPORT)
PREFILLED_SYRINGE | INTRAVENOUS | Status: DC | PRN
  Administered 2023-09-24 (×2): 80 ug via INTRAVENOUS

## 2023-09-24 MED ORDER — ROCURONIUM BROMIDE 10 MG/ML (PF) SYRINGE
PREFILLED_SYRINGE | INTRAVENOUS | Status: AC
  Filled 2023-09-24: qty 20

## 2023-09-24 MED ORDER — ONDANSETRON HCL 4 MG/2ML IJ SOLN
INTRAMUSCULAR | Status: AC
  Filled 2023-09-24: qty 2

## 2023-09-24 MED ORDER — IOPAMIDOL (ISOVUE-300) INJECTION 61%
INTRAVENOUS | Status: DC | PRN
  Administered 2023-09-24: 2.5 mL via URETHRAL

## 2023-09-24 MED ORDER — MIDAZOLAM HCL 2 MG/2ML IJ SOLN
INTRAMUSCULAR | Status: AC
  Filled 2023-09-24: qty 2

## 2023-09-24 MED ORDER — IOHEXOL 350 MG/ML SOLN
75.0000 mL | Freq: Once | INTRAVENOUS | Status: AC | PRN
  Administered 2023-09-24: 75 mL via INTRAVENOUS

## 2023-09-24 MED ORDER — LIDOCAINE HCL 2 % EX GEL
CUTANEOUS | Status: DC | PRN
  Administered 2023-09-24: 1

## 2023-09-24 MED ORDER — ACETAMINOPHEN 10 MG/ML IV SOLN
1000.0000 mg | Freq: Once | INTRAVENOUS | Status: DC | PRN
  Administered 2023-09-24: 1000 mg via INTRAVENOUS

## 2023-09-24 MED ORDER — ONDANSETRON HCL 4 MG/2ML IJ SOLN
4.0000 mg | Freq: Once | INTRAMUSCULAR | Status: AC
  Administered 2023-09-24: 4 mg via INTRAVENOUS
  Filled 2023-09-24: qty 2

## 2023-09-24 MED ORDER — FENTANYL CITRATE (PF) 100 MCG/2ML IJ SOLN
25.0000 ug | INTRAMUSCULAR | Status: DC | PRN
  Administered 2023-09-24: 50 ug via INTRAVENOUS

## 2023-09-24 MED ORDER — LACTATED RINGERS IV SOLN: INTRAVENOUS | Status: AC

## 2023-09-24 MED ORDER — PROPOFOL 10 MG/ML IV BOLUS
INTRAVENOUS | Status: DC | PRN
  Administered 2023-09-24: 80 mg via INTRAVENOUS

## 2023-09-24 MED ORDER — LIDOCAINE 2% (20 MG/ML) 5 ML SYRINGE
INTRAMUSCULAR | Status: AC
  Filled 2023-09-24: qty 5

## 2023-09-24 MED ORDER — SODIUM CHLORIDE 0.9 % IV SOLN
1.0000 g | INTRAVENOUS | Status: DC
  Administered 2023-09-25 – 2023-09-27 (×3): 1 g via INTRAVENOUS
  Filled 2023-09-24 (×3): qty 10

## 2023-09-24 MED ORDER — SCOPOLAMINE 1 MG/3DAYS TD PT72
1.0000 | MEDICATED_PATCH | TRANSDERMAL | Status: DC
Start: 2023-09-24 — End: 2023-09-24
  Filled 2023-09-24: qty 1

## 2023-09-24 MED ORDER — ROCURONIUM BROMIDE 10 MG/ML (PF) SYRINGE
PREFILLED_SYRINGE | INTRAVENOUS | Status: DC | PRN
  Administered 2023-09-24: 40 mg via INTRAVENOUS

## 2023-09-24 MED ORDER — LIDOCAINE 2% (20 MG/ML) 5 ML SYRINGE
INTRAMUSCULAR | Status: DC | PRN
  Administered 2023-09-24: 40 mg via INTRAVENOUS

## 2023-09-24 MED ORDER — ACETAMINOPHEN 325 MG PO TABS
650.0000 mg | ORAL_TABLET | Freq: Four times a day (QID) | ORAL | Status: DC | PRN
  Filled 2023-09-24: qty 2

## 2023-09-24 MED ORDER — LIDOCAINE HCL URETHRAL/MUCOSAL 2 % EX GEL
CUTANEOUS | Status: AC
  Filled 2023-09-24: qty 22

## 2023-09-24 MED ORDER — FENTANYL CITRATE (PF) 250 MCG/5ML IJ SOLN
INTRAMUSCULAR | Status: AC
  Filled 2023-09-24: qty 5

## 2023-09-24 MED ORDER — CHLORHEXIDINE GLUCONATE 0.12 % MT SOLN
OROMUCOSAL | Status: AC
  Administered 2023-09-24: 15 mL via OROMUCOSAL
  Filled 2023-09-24: qty 15

## 2023-09-24 MED ORDER — DROPERIDOL 2.5 MG/ML IJ SOLN: 0.6250 mg | Freq: Once | INTRAMUSCULAR | Status: DC | PRN

## 2023-09-24 MED ORDER — ONDANSETRON HCL 4 MG/2ML IJ SOLN
4.0000 mg | Freq: Four times a day (QID) | INTRAMUSCULAR | Status: DC | PRN
  Administered 2023-09-25: 4 mg via INTRAVENOUS
  Filled 2023-09-24: qty 2

## 2023-09-24 MED ORDER — SODIUM CHLORIDE 0.9 % IV SOLN
2.0000 g | Freq: Once | INTRAVENOUS | Status: AC
  Administered 2023-09-24: 2 g via INTRAVENOUS
  Filled 2023-09-24: qty 20

## 2023-09-24 MED ORDER — PHENAZOPYRIDINE HCL 200 MG PO TABS
200.0000 mg | ORAL_TABLET | Freq: Three times a day (TID) | ORAL | Status: DC | PRN
  Filled 2023-09-24: qty 1

## 2023-09-24 MED ORDER — MORPHINE SULFATE (PF) 4 MG/ML IV SOLN
4.0000 mg | Freq: Once | INTRAVENOUS | Status: AC
  Administered 2023-09-24: 4 mg via INTRAVENOUS
  Filled 2023-09-24: qty 1

## 2023-09-24 MED ORDER — KETOROLAC TROMETHAMINE 15 MG/ML IJ SOLN
15.0000 mg | Freq: Once | INTRAMUSCULAR | Status: AC
  Administered 2023-09-24: 15 mg via INTRAVENOUS

## 2023-09-24 MED ORDER — LACTATED RINGERS IV BOLUS (SEPSIS)
1000.0000 mL | Freq: Once | INTRAVENOUS | Status: AC
  Administered 2023-09-24: 1000 mL via INTRAVENOUS

## 2023-09-24 MED ORDER — ORAL CARE MOUTH RINSE: 15.0000 mL | Freq: Once | OROMUCOSAL | Status: AC

## 2023-09-24 MED ORDER — ONDANSETRON HCL 4 MG/2ML IJ SOLN
INTRAMUSCULAR | Status: DC | PRN
  Administered 2023-09-24: 4 mg via INTRAVENOUS

## 2023-09-24 MED ORDER — DEXAMETHASONE SODIUM PHOSPHATE 10 MG/ML IJ SOLN
INTRAMUSCULAR | Status: AC
  Filled 2023-09-24: qty 1

## 2023-09-24 MED ORDER — OXYBUTYNIN CHLORIDE 5 MG PO TABS
5.0000 mg | ORAL_TABLET | Freq: Three times a day (TID) | ORAL | Status: DC | PRN
  Filled 2023-09-24: qty 1

## 2023-09-24 MED ORDER — ACETAMINOPHEN 10 MG/ML IV SOLN
INTRAVENOUS | Status: AC
  Filled 2023-09-24: qty 100

## 2023-09-24 SURGICAL SUPPLY — 16 items
BAG COUNTER SPONGE SURGICOUNT (BAG) ×2 IMPLANT
BAG URO CATCHER STRL LF (MISCELLANEOUS) ×2 IMPLANT
CATH URETL OPEN END 6FR 70 (CATHETERS) ×2 IMPLANT
ELECTRODE REM PT RTRN 9FT ADLT (ELECTROSURGICAL) IMPLANT
FIBER LASER FLEXIVA PULSE EA (MISCELLANEOUS) IMPLANT
FIBER LASER TRACTIP 200 (UROLOGICAL SUPPLIES) ×2 IMPLANT
GLOVE BIOGEL M STRL SZ7.5 (GLOVE) ×2 IMPLANT
GOWN STRL REUS W/ TWL LRG LVL3 (GOWN DISPOSABLE) ×4 IMPLANT
GUIDEWIRE ANG ZIPWIRE 038X150 (WIRE) IMPLANT
GUIDEWIRE STR DUAL SENSOR (WIRE) ×2 IMPLANT
MANIFOLD NEPTUNE II (INSTRUMENTS) ×4 IMPLANT
PACK CYSTO (CUSTOM PROCEDURE TRAY) ×2 IMPLANT
SHEATH URETERAL 12FRX35CM (MISCELLANEOUS) IMPLANT
SOL PREP POV-IOD 4OZ 10% (MISCELLANEOUS) ×2 IMPLANT
STENT URET 6FRX24 CONTOUR (STENTS) IMPLANT
TUBE CONNECTING 12X1/4 (SUCTIONS) ×2 IMPLANT

## 2023-09-24 NOTE — Sepsis Progress Note (Signed)
 Notified bedside nurse of need to draw repeat lactic acid (on floor, for OR).

## 2023-09-24 NOTE — H&P (View-Only) (Signed)
 Urology Consult Note   Requesting Attending Physician:  Arne Langdon, MD Service Providing Consult: Urology  Consulting Attending: Dr. Willye Harvey   Reason for Consult:  flank pain, left hydronephrosis, possible proximal punctate stone, large uterine fibroid  HPI: [name] is seen in consultation for reasons noted above at the request of Arne Langdon, MD. Pt presents with diffuse abd pain, left side predominance. CT A/P notes left side hydronephrosis, possible punctate stone, and large uterine fibroid.   On exam she was sedate but oriented. She denies hx of nephrolithiasis or other LUTS. We reviewed the anatomy and process of stent placement with pictures. All questions were answered to her satisfaction.  ------------------  Assessment:   48 y.o. female with flank pain, left hydronephrosis, possible proximal punctate stone, large uterine fibroid   Recommendations: #Left hydronephrosis #Left flank pain #Possible proximal left punctate stone #Large uterine fibroid #UTI  Remain NPO To the OR with Dr. Willye Harvey for ureteral stent placement this afternoon UA suggestive of UTI, broad ABX while awaiting speciation and sensitivities. Labs and vitals WNL. Lactic Acidosis  Case and plan discussed with Dr. Willye Harvey  Past Medical History: Past Medical History:  Diagnosis Date   Diabetes mellitus without complication (HCC)    DiGeorge syndrome (HCC)    Ectrodactyly    Genetic defect    genetic mutation of unknown significance per [facility] genetic notes   GERD (gastroesophageal reflux disease)    Hypercholesterolemia    Hypertension    Intellectual disability    Microcephaly (HCC)    Pelvic mass    Ventral hernia     Past Surgical History:  Past Surgical History:  Procedure Laterality Date   CHOLECYSTECTOMY     HAND SURGERY Right    HERNIA REPAIR      Medication: Current Facility-Administered Medications  Medication Dose Route Frequency Provider Last Rate Last  Admin   [START ON 09/25/2023] cefTRIAXone  (ROCEPHIN ) 1 g in sodium chloride  0.9 % 100 mL IVPB  1 g Intravenous Q24H Arne Langdon, MD       enoxaparin  (LOVENOX ) injection 40 mg  40 mg Subcutaneous Q24H Arne Langdon, MD       lactated ringers  infusion   Intravenous Continuous Ballard Bongo, MD 150 mL/hr at 09/24/23 0553 New Bag at 09/24/23 0553   ondansetron  (ZOFRAN ) injection 4 mg  4 mg Intravenous Q6H PRN Arne Langdon, MD       Current Outpatient Medications  Medication Sig Dispense Refill   ASPIRIN  LOW DOSE 81 MG EC tablet Take 1 tablet (81 mg total) by mouth daily. (Patient taking differently: Take 81 mg by mouth at bedtime.) 100 tablet 2   atorvastatin (LIPITOR) 40 MG tablet Take 40 mg by mouth at bedtime.     Dulaglutide 1.5 MG/0.5ML SOAJ Inject 1.5 mg into the skin every Tuesday.     ferrous sulfate  300 (60 Fe) MG/5ML syrup Take 5 mLs (300 mg total) by mouth daily. 150 mL 0   insulin  glargine (LANTUS ) 100 UNIT/ML Solostar Pen Inject 6 Units into the skin 2 (two) times daily. (Patient taking differently: Inject 16-60 Units into the skin See admin instructions. Inject 60 units into the skin in the morning and 16 units at bedtime) 15 mL 0   omeprazole  (PRILOSEC) 20 MG capsule Take 1 capsule (20 mg total) by mouth daily. (Patient taking differently: Take 20 mg by mouth daily before breakfast.) 30 capsule 2   Accu-Chek Softclix Lancets lancets Use 3 (three) times daily as directed 100 each 12  glucose blood (ACCU-CHEK GUIDE) test strip Use 3 (three) times daily as instructed 100 each 12   Vitamin D, Ergocalciferol, (DRISDOL) 1.25 MG (50000 UNIT) CAPS capsule Take 50,000 Units by mouth once a week. (Patient not taking: Reported on 09/24/2023)      Allergies: No Known Allergies  Social History: Social History   Tobacco Use   Smoking status: Never   Smokeless tobacco: Never  Vaping Use   Vaping status: Never Used  Substance Use Topics   Alcohol use: No   Drug use: No     Family History Family History  Problem Relation Age of Onset   Diabetes Mother    GER disease Mother    Diabetes Father     Review of Systems  Genitourinary:  Positive for flank pain. Negative for dysuria, frequency, hematuria and urgency.     Objective   Vital signs in last 24 hours: BP (!) 175/80 (BP Location: Right Arm)   Pulse (!) 109   Temp 97.8 F (36.6 C) (Oral)   Resp 20   SpO2 100%   Physical Exam General: A&O, resting, appropriate HEENT: /AT Pulmonary: Normal work of breathing Cardiovascular: no cyanosis Abdomen: Soft, NTTP, nondistended Neuro: Appropriate, no focal neurological deficits  Most Recent Labs: Lab Results  Component Value Date   WBC 7.4 09/23/2023   HGB 14.0 09/23/2023   HCT 42.9 09/23/2023   PLT 349 09/23/2023    Lab Results  Component Value Date   NA 135 09/23/2023   K 3.9 09/23/2023   CL 102 09/23/2023   CO2 20 (L) 09/23/2023   BUN 15 09/23/2023   CREATININE 0.74 09/23/2023   CALCIUM 8.9 09/23/2023   MG 1.9 12/17/2021    Lab Results  Component Value Date   INR 1.0 12/17/2021   APTT 29 01/16/2007     Urine Culture: @[id number] (laburin,org,[id number])@   IMAGING: CT ABDOMEN PELVIS W CONTRAST Result Date: 09/24/2023 CLINICAL DATA:  48 year old female with abdominal pain and nausea for 2 weeks. EXAM: CT ABDOMEN AND PELVIS WITH CONTRAST TECHNIQUE: Multidetector CT imaging of the abdomen and pelvis was performed using the standard protocol following bolus administration of intravenous contrast. RADIATION DOSE REDUCTION: This exam was performed according to the departmental dose-optimization program which includes automated exposure control, adjustment of the mA and/or kV according to patient size and/or use of iterative reconstruction technique. CONTRAST:  75mL OMNIPAQUE  IOHEXOL  350 MG/ML SOLN COMPARISON:  CT Abdomen and Pelvis 07/24/2022. FINDINGS: Lower chest: Stable and normal. Hepatobiliary: Chronic cholecystectomy. Liver  is within normal limits. Pancreas: Negative. Spleen: Negative. Adrenals/Urinary Tract: Normal adrenal glands. Fairly symmetric initial renal contrast enhancement. But asymmetric left hydronephrosis, left hydroureter, and delayed left renal contrast excretion compared to the right. Mild left urothelial thickening and enhancement in addition to hydroureter (series 3, image 37). And the left ureter seems to taper abruptly on series 8, image 28 where a punctate calcification can be identified, adjacent to the left gonadal vessels. Numerous pelvic phleboliths. No distal hydroureter identified. Moderately distended urinary bladder with mild bladder wall thickening, similar to last year. No evidence of pyelonephritis. Stomach/Bowel: Mostly decompressed large and small bowel loops throughout the abdomen and pelvis. Retained stool in the rectum. Decreased volume of visceral fat when compared to last year. And difficult to exclude generalized indistinct appearance of the bowel now. Decompressed distal stomach and duodenum. No pneumoperitoneum or free fluid. Appendix not identified. Vascular/Lymphatic: Mild Calcified aortic atherosclerosis. Major vascular structures in the abdomen and pelvis are enhancing and appear to be  patent. SMA calcified atherosclerosis also visible. Normal caliber abdominal aorta. No lymphadenopathy. Reproductive: Chronic severe lobulated and enlarged multi fibroid uterus with mildly increased size, and moderately increased heterogeneity since last year. The dominant solid fundal fibroid is now 57 mm diameter, 54 mm last year. Adjacent oval fibroids up to 4 cm now appear low-density and degenerated (series 3, image 59). No surrounding inflammation. Adnexa within normal limits. Other: No pelvis free fluid. Musculoskeletal: Negative. IMPRESSION: 1. Left Hydronephrosis and proximal left Hydroureter. Delayed contrast excretion suggesting obstructive uropathy with a possible punctate obstructing left  ureteral calculus located about 3 cm distal to the left ureterovesical junction. But there is also left urothelial, bladder wall mild thickening and enhancement. Differential diagnosis includes ascending urinary tract infection with pyonephrosis. No evidence of pyelonephritis. 2. Decreased volume of visceral fat since last year. Indistinct appearance of the bowel, but no discrete bowel inflammation. No evidence of bowel obstruction. 3. Severe Fibroid Uterus with increased size of dominant solid fundal fibroid since last year (now up to 57 mm), with smaller surrounding fibroids having centrally degenerated since last year. 4.  Aortic Atherosclerosis (ICD10-I70.0). Electronically Signed   By: Marlise Simpers M.D.   On: 09/24/2023 04:45    ------  Alla Ar, NP Pager: [phone]   Please contact the urology consult pager with any further questions/concerns.

## 2023-09-24 NOTE — Progress Notes (Signed)
 Elink monitoring for the code sepsis protocol.

## 2023-09-24 NOTE — Anesthesia Preprocedure Evaluation (Addendum)
 Anesthesia Evaluation  Patient identified by MRN, date of birth, ID band Patient awake    Reviewed: Allergy & Precautions, NPO status , Patient's Chart, lab work & pertinent test results  Airway Mallampati: II  TM Distance: >3 FB Neck ROM: Full    Dental  (+) Teeth Intact, Loose,    Pulmonary neg pulmonary ROS   breath sounds clear to auscultation       Cardiovascular hypertension,  Rhythm:Regular Rate:Normal     Neuro/Psych negative neurological ROS  negative psych ROS   GI/Hepatic Neg liver ROS,GERD  Medicated,,  Endo/Other  diabetes, Type 2, Insulin  Dependent    Renal/GU Renal disease     Musculoskeletal negative musculoskeletal ROS (+)    Abdominal   Peds  Hematology negative hematology ROS (+)   Anesthesia Other Findings DiGeorge syndrome  Reproductive/Obstetrics                             Anesthesia Physical Anesthesia Plan  ASA: 3  Anesthesia Plan: General   Post-op Pain Management: Tylenol  PO (pre-op)*   Induction: Intravenous, Rapid sequence and Cricoid pressure planned  PONV Risk Score and Plan: 4 or greater and Ondansetron , Dexamethasone, Midazolam and Scopolamine patch - Pre-op  Airway Management Planned: Oral ETT  Additional Equipment: None  Intra-op Plan:   Post-operative Plan: Extubation in OR  Informed Consent: I have reviewed the patients History and Physical, chart, labs and discussed the procedure including the risks, benefits and alternatives for the proposed anesthesia with the patient or authorized representative who has indicated his/her understanding and acceptance.     Dental advisory given  Plan Discussed with: CRNA  Anesthesia Plan Comments:        Anesthesia Quick Evaluation

## 2023-09-24 NOTE — ED Notes (Signed)
 Called CT tech regarding pending CT Abd. Per CT pt needs negative preg test. Pt was able to provided urine sample. Called CT informing them of results.

## 2023-09-24 NOTE — Op Note (Signed)
 OPERATIVE NOTE   Patient Name: [name]  MRN: [id number]   Date of Procedure: 09/24/23    Preoperative diagnosis:  Left hydronephrosis  Left ureteral calculus Left flank pain UTI   Postoperative diagnosis:  Left hydronephrosis Left ureteral calculus Left flank pain UTI  Procedure:  Cystoscopy Left retrograde pyelogram with intraoperative interpretation Insertion of left ureteral stent (64F x 24 cm, no tether)  Attending: Mellie Sprinkle, MD  Anesthesia: General  Estimated blood loss: 5 mL  Fluids: Per anesthesia record  Drains: 64F x 24 cm ureteral stent on left (tether removed)  Specimens: None  Antibiotics: Rocephin  IV given in ER  Findings:  Normal urethra and bladder Left hydronephrosis with apparent small proximal ureteral calculus  Indications:  48 year old female presented to the emergency room early this morning with left flank pain x 1 week.  She had associated nausea without vomiting.  No fevers or chills.  No dysuria or gross hematuria.  Urinalysis showed many bacteria, >50 WBCs, 6-10 RBCs, nitrite negative. Lactic acid elevated at 2.2.  White blood cell count 7.4K.  Creatinine 0.74. CT abdomen and pelvis with contrast showed left hydronephrosis and left proximal hydroureter with a punctate calcification in the proximal ureter. She has remained hemodynamically stable and afebrile. She presents now for cystoscopy, left retrograde pyelogram, insertion of left ureteral stent for management of the left hydronephrosis secondary to an apparent left ureteral calculus in the setting of a UTI.  Risk of the procedure discussed with the patient.  She understands wishes to proceed as described.  Description of Procedure:  The patient received IV Rocephin  in the emergency room.  She was taken to the operating room suite and properly identified.  After successful induction of a general anesthetic, she was placed in the dorsolithotomy position.  The  patient's genital area was prepped and draped in sterile fashion.  A preoperative timeout was performed.  A 21 French rigid cystoscope was passed through the urethra and into the bladder.  Inspection of the bladder demonstrated no mucosal abnormalities.  There was some mass effect on the bladder from the known large uterine fibroid.  A normal-appearing trigone was noted with a single orifice bilaterally.  Scout film demonstrated a nonspecific bowel gas pattern without any obvious bony abnormalities.  No obvious calcifications were seen along the expected course of the ureter.  There was a small amount of contrast still seen within the dilated left collecting system.  A 5 French open-ended catheter was placed into the distal left ureter with the assistance of a sensor wire.  Contrast was injected through the catheter and demonstrated a normal distal and mid ureter.  There was some dilation of the proximal left ureter and collecting system.  No obvious filling defect was appreciated.  The open-ended catheter was passed up into the left renal pelvis.  There was some minimal resistance noted in the proximal ureter suspicious for a possible calculus.  A hydronephrotic drip was noted.  The sensor guidewire was replaced.  A 6 French by 24 cm double-J stent was then passed over the guidewire.  A curl was noted in the left renal pelvis.  Position of the stent was confirmed with fluoroscopy and cystoscopy.  The bladder was then drained and the cystoscope was removed.  Intraurethral lidocaine jelly was placed.  The patient was then extubated and taken to the postanesthesia care unit in stable condition.  Complications: None  Condition: Stable, extubated, transferred to PACU  Plan:  Continue left ureteral stent Broad-spectrum  antibiotics pending urine culture results Will need outpatient follow-up for management of the stone.

## 2023-09-24 NOTE — Transfer of Care (Signed)
 Immediate Anesthesia Transfer of Care Note  Patient: [name]  Procedure(s) Performed: CYSTOSCOPY, WITH RETROGRADE PYELOGRAM AND URETERAL STENT INSERTION (Left)  Patient Location: PACU  Anesthesia Type:General  Level of Consciousness: awake, drowsy, and patient cooperative  Airway & Oxygen Therapy: Patient Spontanous Breathing  Post-op Assessment: Report given to RN, Post -op Vital signs reviewed and stable, and Patient moving all extremities X 4  Post vital signs: Reviewed and stable  Last Vitals:  Vitals Value Taken Time  BP 123/106 09/24/23 13:30  Temp    Pulse 109 09/24/23 13:31  Resp 21 09/24/23 13:31  SpO2 100 % 09/24/23 13:31  Vitals shown include unfiled device data.  Last Pain:  Vitals:   09/24/23 1149  TempSrc:   PainSc: 9          Complications: No notable events documented.

## 2023-09-24 NOTE — Sepsis Progress Note (Signed)
 Notified bedside nurse of need to draw repeat lactic acid.

## 2023-09-24 NOTE — Anesthesia Procedure Notes (Signed)
 Procedure Name: Intubation Date/Time: 09/24/2023 12:46 PM  Performed by: Zola Hint, CRNAPre-anesthesia Checklist: Patient identified, Emergency Drugs available, Suction available and Patient being monitored Patient Re-evaluated:Patient Re-evaluated prior to induction Oxygen Delivery Method: Circle System Utilized Preoxygenation: Pre-oxygenation with 100% oxygen Induction Type: IV induction Ventilation: Mask ventilation without difficulty Laryngoscope Size: Mac and 3 Grade View: Grade I Tube type: Oral Tube size: 6.5 mm Number of attempts: 1 Airway Equipment and Method: Stylet and Oral airway Placement Confirmation: ETT inserted through vocal cords under direct vision, positive ETCO2 and breath sounds checked- equal and bilateral Secured at: 21 cm Tube secured with: Tape Dental Injury: Teeth and Oropharynx as per pre-operative assessment

## 2023-09-24 NOTE — Interval H&P Note (Signed)
 History and Physical Interval Note:  09/24/2023 12:27 PM  [name]  has presented today for surgery, with the diagnosis of urinary tract infection.  The various methods of treatment have been discussed with the patient and family. After consideration of risks, benefits and other options for treatment, the patient has consented to  Procedure(s): CYSTOSCOPY, WITH RETROGRADE PYELOGRAM AND URETERAL STENT INSERTION (Left) as a surgical intervention.  The patient's history has been reviewed, patient examined, no change in status, stable for surgery.  I have reviewed the patient's chart and labs.  Questions were answered to the patient's satisfaction.     [name]

## 2023-09-24 NOTE — Consult Note (Signed)
 Urology Consult Note   Requesting Attending Physician:  Arne Langdon, MD Service Providing Consult: Urology  Consulting Attending: Dr. Willye Harvey   Reason for Consult:  flank pain, left hydronephrosis, possible proximal punctate stone, large uterine fibroid  HPI: [name] is seen in consultation for reasons noted above at the request of Arne Langdon, MD. Pt presents with diffuse abd pain, left side predominance. CT A/P notes left side hydronephrosis, possible punctate stone, and large uterine fibroid.   On exam she was sedate but oriented. She denies hx of nephrolithiasis or other LUTS. We reviewed the anatomy and process of stent placement with pictures. All questions were answered to her satisfaction.  ------------------  Assessment:   48 y.o. female with flank pain, left hydronephrosis, possible proximal punctate stone, large uterine fibroid   Recommendations: #Left hydronephrosis #Left flank pain #Possible proximal left punctate stone #Large uterine fibroid #UTI  Remain NPO To the OR with Dr. Willye Harvey for ureteral stent placement this afternoon UA suggestive of UTI, broad ABX while awaiting speciation and sensitivities. Labs and vitals WNL. Lactic Acidosis  Case and plan discussed with Dr. Willye Harvey  Past Medical History: Past Medical History:  Diagnosis Date   Diabetes mellitus without complication (HCC)    DiGeorge syndrome (HCC)    Ectrodactyly    Genetic defect    genetic mutation of unknown significance per [facility] genetic notes   GERD (gastroesophageal reflux disease)    Hypercholesterolemia    Hypertension    Intellectual disability    Microcephaly (HCC)    Pelvic mass    Ventral hernia     Past Surgical History:  Past Surgical History:  Procedure Laterality Date   CHOLECYSTECTOMY     HAND SURGERY Right    HERNIA REPAIR      Medication: Current Facility-Administered Medications  Medication Dose Route Frequency Provider Last Rate Last  Admin   [START ON 09/25/2023] cefTRIAXone  (ROCEPHIN ) 1 g in sodium chloride  0.9 % 100 mL IVPB  1 g Intravenous Q24H Arne Langdon, MD       enoxaparin  (LOVENOX ) injection 40 mg  40 mg Subcutaneous Q24H Arne Langdon, MD       lactated ringers  infusion   Intravenous Continuous Ballard Bongo, MD 150 mL/hr at 09/24/23 0553 New Bag at 09/24/23 0553   ondansetron  (ZOFRAN ) injection 4 mg  4 mg Intravenous Q6H PRN Arne Langdon, MD       Current Outpatient Medications  Medication Sig Dispense Refill   ASPIRIN  LOW DOSE 81 MG EC tablet Take 1 tablet (81 mg total) by mouth daily. (Patient taking differently: Take 81 mg by mouth at bedtime.) 100 tablet 2   atorvastatin (LIPITOR) 40 MG tablet Take 40 mg by mouth at bedtime.     Dulaglutide 1.5 MG/0.5ML SOAJ Inject 1.5 mg into the skin every Tuesday.     ferrous sulfate  300 (60 Fe) MG/5ML syrup Take 5 mLs (300 mg total) by mouth daily. 150 mL 0   insulin  glargine (LANTUS ) 100 UNIT/ML Solostar Pen Inject 6 Units into the skin 2 (two) times daily. (Patient taking differently: Inject 16-60 Units into the skin See admin instructions. Inject 60 units into the skin in the morning and 16 units at bedtime) 15 mL 0   omeprazole  (PRILOSEC) 20 MG capsule Take 1 capsule (20 mg total) by mouth daily. (Patient taking differently: Take 20 mg by mouth daily before breakfast.) 30 capsule 2   Accu-Chek Softclix Lancets lancets Use 3 (three) times daily as directed 100 each 12  glucose blood (ACCU-CHEK GUIDE) test strip Use 3 (three) times daily as instructed 100 each 12   Vitamin D, Ergocalciferol, (DRISDOL) 1.25 MG (50000 UNIT) CAPS capsule Take 50,000 Units by mouth once a week. (Patient not taking: Reported on 09/24/2023)      Allergies: No Known Allergies  Social History: Social History   Tobacco Use   Smoking status: Never   Smokeless tobacco: Never  Vaping Use   Vaping status: Never Used  Substance Use Topics   Alcohol use: No   Drug use: No     Family History Family History  Problem Relation Age of Onset   Diabetes Mother    GER disease Mother    Diabetes Father     Review of Systems  Genitourinary:  Positive for flank pain. Negative for dysuria, frequency, hematuria and urgency.     Objective   Vital signs in last 24 hours: BP (!) 175/80 (BP Location: Right Arm)   Pulse (!) 109   Temp 97.8 F (36.6 C) (Oral)   Resp 20   SpO2 100%   Physical Exam General: A&O, resting, appropriate HEENT: /AT Pulmonary: Normal work of breathing Cardiovascular: no cyanosis Abdomen: Soft, NTTP, nondistended Neuro: Appropriate, no focal neurological deficits  Most Recent Labs: Lab Results  Component Value Date   WBC 7.4 09/23/2023   HGB 14.0 09/23/2023   HCT 42.9 09/23/2023   PLT 349 09/23/2023    Lab Results  Component Value Date   NA 135 09/23/2023   K 3.9 09/23/2023   CL 102 09/23/2023   CO2 20 (L) 09/23/2023   BUN 15 09/23/2023   CREATININE 0.74 09/23/2023   CALCIUM 8.9 09/23/2023   MG 1.9 12/17/2021    Lab Results  Component Value Date   INR 1.0 12/17/2021   APTT 29 01/16/2007     Urine Culture: @[id number] (laburin,org,[id number])@   IMAGING: CT ABDOMEN PELVIS W CONTRAST Result Date: 09/24/2023 CLINICAL DATA:  48 year old female with abdominal pain and nausea for 2 weeks. EXAM: CT ABDOMEN AND PELVIS WITH CONTRAST TECHNIQUE: Multidetector CT imaging of the abdomen and pelvis was performed using the standard protocol following bolus administration of intravenous contrast. RADIATION DOSE REDUCTION: This exam was performed according to the departmental dose-optimization program which includes automated exposure control, adjustment of the mA and/or kV according to patient size and/or use of iterative reconstruction technique. CONTRAST:  75mL OMNIPAQUE  IOHEXOL  350 MG/ML SOLN COMPARISON:  CT Abdomen and Pelvis 07/24/2022. FINDINGS: Lower chest: Stable and normal. Hepatobiliary: Chronic cholecystectomy. Liver  is within normal limits. Pancreas: Negative. Spleen: Negative. Adrenals/Urinary Tract: Normal adrenal glands. Fairly symmetric initial renal contrast enhancement. But asymmetric left hydronephrosis, left hydroureter, and delayed left renal contrast excretion compared to the right. Mild left urothelial thickening and enhancement in addition to hydroureter (series 3, image 37). And the left ureter seems to taper abruptly on series 8, image 28 where a punctate calcification can be identified, adjacent to the left gonadal vessels. Numerous pelvic phleboliths. No distal hydroureter identified. Moderately distended urinary bladder with mild bladder wall thickening, similar to last year. No evidence of pyelonephritis. Stomach/Bowel: Mostly decompressed large and small bowel loops throughout the abdomen and pelvis. Retained stool in the rectum. Decreased volume of visceral fat when compared to last year. And difficult to exclude generalized indistinct appearance of the bowel now. Decompressed distal stomach and duodenum. No pneumoperitoneum or free fluid. Appendix not identified. Vascular/Lymphatic: Mild Calcified aortic atherosclerosis. Major vascular structures in the abdomen and pelvis are enhancing and appear to be  patent. SMA calcified atherosclerosis also visible. Normal caliber abdominal aorta. No lymphadenopathy. Reproductive: Chronic severe lobulated and enlarged multi fibroid uterus with mildly increased size, and moderately increased heterogeneity since last year. The dominant solid fundal fibroid is now 57 mm diameter, 54 mm last year. Adjacent oval fibroids up to 4 cm now appear low-density and degenerated (series 3, image 59). No surrounding inflammation. Adnexa within normal limits. Other: No pelvis free fluid. Musculoskeletal: Negative. IMPRESSION: 1. Left Hydronephrosis and proximal left Hydroureter. Delayed contrast excretion suggesting obstructive uropathy with a possible punctate obstructing left  ureteral calculus located about 3 cm distal to the left ureterovesical junction. But there is also left urothelial, bladder wall mild thickening and enhancement. Differential diagnosis includes ascending urinary tract infection with pyonephrosis. No evidence of pyelonephritis. 2. Decreased volume of visceral fat since last year. Indistinct appearance of the bowel, but no discrete bowel inflammation. No evidence of bowel obstruction. 3. Severe Fibroid Uterus with increased size of dominant solid fundal fibroid since last year (now up to 57 mm), with smaller surrounding fibroids having centrally degenerated since last year. 4.  Aortic Atherosclerosis (ICD10-I70.0). Electronically Signed   By: Marlise Simpers M.D.   On: 09/24/2023 04:45    ------  Alla Ar, NP Pager: [phone]   Please contact the urology consult pager with any further questions/concerns.

## 2023-09-24 NOTE — Plan of Care (Signed)

## 2023-09-24 NOTE — ED Provider Notes (Signed)
 [place] [facility]MENT AT [facility] Provider Note   CSN: [id number] Arrival date & time: 09/23/23  0102     Patient presents with: Abdominal Pain   [name] is a 48 y.o. female.   Patient presents to the emergency department for evaluation of abdominal pain.  Patient reports somewhat diffuse but also some left-sided predominant abdominal pain that has been ongoing for more than a week.  She has had associated nausea, no vomiting.  Patient does report some urinary frequency, no dysuria.       Prior to Admission medications   Medication Sig Start Date End Date Taking? Authorizing Provider  ASPIRIN  LOW DOSE 81 MG EC tablet Take 1 tablet (81 mg total) by mouth daily. Patient taking differently: Take 81 mg by mouth at bedtime. 07/24/21  Yes Vernell Goldsmith, MD  atorvastatin (LIPITOR) 40 MG tablet Take 40 mg by mouth at bedtime. 09/02/23  Yes [provider]  Dulaglutide 1.5 MG/0.5ML SOAJ Inject 1.5 mg into the skin every Tuesday. 10/14/22  Yes [provider]  ferrous sulfate  300 (60 Fe) MG/5ML syrup Take 5 mLs (300 mg total) by mouth daily. 06/28/22  Yes Beatty, Celeste A, PA-C  insulin  glargine (LANTUS ) 100 UNIT/ML Solostar Pen Inject 6 Units into the skin 2 (two) times daily. Patient taking differently: Inject 16-60 Units into the skin See admin instructions. Inject 60 units into the skin in the morning and 16 units at bedtime 10/24/22  Yes Hall, Carole N, DO  omeprazole  (PRILOSEC) 20 MG capsule Take 1 capsule (20 mg total) by mouth daily. Patient taking differently: Take 20 mg by mouth daily before breakfast. 06/26/17  Yes Alejandro Amour B, PA-C  Accu-Chek Softclix Lancets lancets Use 3 (three) times daily as directed 08/08/21   Vernell Goldsmith, MD  glucose blood (ACCU-CHEK GUIDE) test strip Use 3 (three) times daily as instructed 07/03/21   Vernell Goldsmith, MD  Vitamin D, Ergocalciferol, (DRISDOL) 1.25 MG (50000 UNIT) CAPS capsule Take  50,000 Units by mouth once a week. Patient not taking: Reported on 09/24/2023 06/11/23   [provider]    Allergies: Patient has no known allergies.    Review of Systems  Updated Vital Signs BP (!) 175/80 (BP Location: Right Arm)   Pulse (!) 109   Temp 97.8 F (36.6 C) (Oral)   Resp 20   SpO2 100%   Physical Exam Vitals and nursing note reviewed.  Constitutional:      General: She is not in acute distress.    Appearance: She is well-developed.  HENT:     Head: Normocephalic and atraumatic.     Mouth/Throat:     Mouth: Mucous membranes are moist.   Eyes:     General: Vision grossly intact. Gaze aligned appropriately.     Extraocular Movements: Extraocular movements intact.     Conjunctiva/sclera: Conjunctivae normal.    Cardiovascular:     Rate and Rhythm: Regular rhythm. Tachycardia present.     Pulses: Normal pulses.     Heart sounds: Normal heart sounds, S1 normal and S2 normal. No murmur heard.    No friction rub. No gallop.  Pulmonary:     Effort: Pulmonary effort is normal. No respiratory distress.     Breath sounds: Normal breath sounds.  Abdominal:     General: Bowel sounds are normal.     Palpations: Abdomen is soft.     Tenderness: There is generalized abdominal tenderness. There is no guarding or rebound.  Hernia: No hernia is present.   Musculoskeletal:        General: No swelling.     Cervical back: Full passive range of motion without pain, normal range of motion and neck supple. No spinous process tenderness or muscular tenderness. Normal range of motion.     Right lower leg: No edema.     Left lower leg: No edema.   Skin:    General: Skin is warm and dry.     Capillary Refill: Capillary refill takes less than 2 seconds.     Findings: No ecchymosis, erythema, rash or wound.   Neurological:     General: No focal deficit present.     Mental Status: She is alert and oriented to person, place, and time.     GCS: GCS eye subscore is 4.  GCS verbal subscore is 5. GCS motor subscore is 6.     Cranial Nerves: Cranial nerves 2-12 are intact.     Sensory: Sensation is intact.     Motor: Motor function is intact.     Coordination: Coordination is intact.   Psychiatric:        Attention and Perception: Attention normal.        Mood and Affect: Mood normal.        Speech: Speech normal.        Behavior: Behavior normal.     (all labs ordered are listed, but only abnormal results are displayed) Labs Reviewed  COMPREHENSIVE METABOLIC PANEL WITH GFR - Abnormal; Notable for the following components:      Result Value   CO2 20 (*)    Total Protein 8.4 (*)    All other components within normal limits  URINALYSIS, ROUTINE W REFLEX MICROSCOPIC - Abnormal; Notable for the following components:   Color, Urine AMBER (*)    APPearance CLOUDY (*)    Hgb urine dipstick SMALL (*)    Ketones, ur 20 (*)    Protein, ur 100 (*)    Leukocytes,Ua MODERATE (*)    Bacteria, UA MANY (*)    All other components within normal limits  CBG MONITORING, ED - Abnormal; Notable for the following components:   Glucose-Capillary 187 (*)    All other components within normal limits  I-STAT CG4 LACTIC ACID, ED - Abnormal; Notable for the following components:   Lactic Acid, Venous 2.2 (*)    All other components within normal limits  URINE CULTURE  CULTURE, BLOOD (ROUTINE X 2)  CULTURE, BLOOD (ROUTINE X 2)  LIPASE, BLOOD  CBC  PREGNANCY, URINE    EKG: EKG Interpretation Date/Time:  Thursday September 24 2023 03:35:54 EDT Ventricular Rate:  113 PR Interval:  118 QRS Duration:  70 QT Interval:  344 QTC Calculation: 471 R Axis:   83  Text Interpretation: Sinus tachycardia Otherwise normal ECG When compared with ECG of 24-Jul-2022 13:51, PREVIOUS ECG IS PRESENT Confirmed by Ballard Bongo [phone]) on 09/24/2023 5:08:37 AM  Radiology: CT ABDOMEN PELVIS W CONTRAST Result Date: 09/24/2023 CLINICAL DATA:  48 year old female with abdominal  pain and nausea for 2 weeks. EXAM: CT ABDOMEN AND PELVIS WITH CONTRAST TECHNIQUE: Multidetector CT imaging of the abdomen and pelvis was performed using the standard protocol following bolus administration of intravenous contrast. RADIATION DOSE REDUCTION: This exam was performed according to the departmental dose-optimization program which includes automated exposure control, adjustment of the mA and/or kV according to patient size and/or use of iterative reconstruction technique. CONTRAST:  75mL OMNIPAQUE  IOHEXOL  350 MG/ML SOLN  COMPARISON:  CT Abdomen and Pelvis 07/24/2022. FINDINGS: Lower chest: Stable and normal. Hepatobiliary: Chronic cholecystectomy. Liver is within normal limits. Pancreas: Negative. Spleen: Negative. Adrenals/Urinary Tract: Normal adrenal glands. Fairly symmetric initial renal contrast enhancement. But asymmetric left hydronephrosis, left hydroureter, and delayed left renal contrast excretion compared to the right. Mild left urothelial thickening and enhancement in addition to hydroureter (series 3, image 37). And the left ureter seems to taper abruptly on series 8, image 28 where a punctate calcification can be identified, adjacent to the left gonadal vessels. Numerous pelvic phleboliths. No distal hydroureter identified. Moderately distended urinary bladder with mild bladder wall thickening, similar to last year. No evidence of pyelonephritis. Stomach/Bowel: Mostly decompressed large and small bowel loops throughout the abdomen and pelvis. Retained stool in the rectum. Decreased volume of visceral fat when compared to last year. And difficult to exclude generalized indistinct appearance of the bowel now. Decompressed distal stomach and duodenum. No pneumoperitoneum or free fluid. Appendix not identified. Vascular/Lymphatic: Mild Calcified aortic atherosclerosis. Major vascular structures in the abdomen and pelvis are enhancing and appear to be patent. SMA calcified atherosclerosis also  visible. Normal caliber abdominal aorta. No lymphadenopathy. Reproductive: Chronic severe lobulated and enlarged multi fibroid uterus with mildly increased size, and moderately increased heterogeneity since last year. The dominant solid fundal fibroid is now 57 mm diameter, 54 mm last year. Adjacent oval fibroids up to 4 cm now appear low-density and degenerated (series 3, image 59). No surrounding inflammation. Adnexa within normal limits. Other: No pelvis free fluid. Musculoskeletal: Negative. IMPRESSION: 1. Left Hydronephrosis and proximal left Hydroureter. Delayed contrast excretion suggesting obstructive uropathy with a possible punctate obstructing left ureteral calculus located about 3 cm distal to the left ureterovesical junction. But there is also left urothelial, bladder wall mild thickening and enhancement. Differential diagnosis includes ascending urinary tract infection with pyonephrosis. No evidence of pyelonephritis. 2. Decreased volume of visceral fat since last year. Indistinct appearance of the bowel, but no discrete bowel inflammation. No evidence of bowel obstruction. 3. Severe Fibroid Uterus with increased size of dominant solid fundal fibroid since last year (now up to 57 mm), with smaller surrounding fibroids having centrally degenerated since last year. 4.  Aortic Atherosclerosis (ICD10-I70.0). Electronically Signed   By: Marlise Simpers M.D.   On: 09/24/2023 04:45     Procedures   Medications Ordered in the ED  lactated ringers  infusion ( Intravenous New Bag/Given 09/24/23 0553)  lactated ringers  bolus 1,000 mL (1,000 mLs Intravenous New Bag/Given 09/24/23 0546)  cefTRIAXone  (ROCEPHIN ) 2 g in sodium chloride  0.9 % 100 mL IVPB (2 g Intravenous New Bag/Given 09/24/23 0549)  ondansetron  (ZOFRAN ) injection 4 mg (4 mg Intravenous Given 09/23/23 2040)  iohexol  (OMNIPAQUE ) 350 MG/ML injection 75 mL (75 mLs Intravenous Contrast Given 09/24/23 0410)  morphine  (PF) 4 MG/ML injection 4 mg (4 mg  Intravenous Given 09/24/23 0550)  ondansetron  (ZOFRAN ) injection 4 mg (4 mg Intravenous Given 09/24/23 0550)                                    Medical Decision Making Amount and/or Complexity of Data Reviewed Labs: ordered. Decision-making details documented in ED Course. Radiology: ordered and independent interpretation performed. Decision-making details documented in ED Course.  Risk Prescription drug management. Decision regarding hospitalization.   Differential Diagnosis considered includes, but not limited to: Cholelithiasis; cholecystitis; cholangitis; bowel obstruction; esophagitis; gastritis; peptic ulcer disease; pancreatitis; ureterolithiasis, pyelonephritis  Patient  presents to the emergency department for evaluation of abdominal pain.  Patient had workup initiated through triage and provider in triage.  Patient now in an exam room.  Reviewing her results, she does have what appears to be a urinary tract infection.  CT abdomen and pelvis shows left-sided hydronephrosis and proximal hydroureter with possible stone in the ureter.  Remainder of septic workup has been ordered.  Patient to receive 2 g of Rocephin  IV.  CT findings and urinalysis discussed with Dr. Willye Harvey, on-call for urology.  Agrees with hospitalist admission, antibiotics and he will review imaging and urology will consult.      Final diagnoses:  Urinary tract infection without hematuria, site unspecified    ED Discharge Orders     None          Ballard Bongo, MD 09/24/23 [phone]

## 2023-09-24 NOTE — H&P (Signed)
 History and Physical    Patient: [name] WUJ:[id number] DOB: 1975/10/19 DOA: 09/23/2023 DOS: the patient was seen and examined on 09/24/2023 PCP: Benn Brash, PA-C  Patient coming from: Home  Chief Complaint:  Chief Complaint  Patient presents with   Abdominal Pain   HPI: [name] is a 48 y.o. female with medical history significant of intellectual disability, HTN, HLD, and DM2 p/w abdominal pain and found to have L hydronephrosis c/f pyelonephritis c/b nephrolithiasis.  Pt states that she was in her USOH until she started having abd pain on Wednesday. The pain caused her decreased appetite and fatigue. Her pain worsened on this morning, which prompted her to present to the ED.  In the ED, pt hypertensive and tachycardic. Labs notable for lactic acid 2.2. UA showed positive LE and neg nitrite, but many bacteria. CT abdomen showed left hydronephrosis and proximal left hydroureter, and delayed contrast excretion suggesting obstructive uropathy with a possible punctate obstructing left ureteral calculus located about 3 cm distal to the left ureterovesical junction. Pt started on IV CTX and admitted to medicine with Urology following.  Review of Systems: As mentioned in the history of present illness. All other systems reviewed and are negative. Past Medical History:  Diagnosis Date   Diabetes mellitus without complication (HCC)    DiGeorge syndrome (HCC)    Ectrodactyly    Genetic defect    genetic mutation of unknown significance per [facility] genetic notes   GERD (gastroesophageal reflux disease)    Hypercholesterolemia    Hypertension    Intellectual disability    Microcephaly (HCC)    Pelvic mass    Ventral hernia    Past Surgical History:  Procedure Laterality Date   CHOLECYSTECTOMY     HAND SURGERY Right    HERNIA REPAIR     Social History:  reports that she has never smoked. She has never used smokeless tobacco. She reports that she does not drink  alcohol and does not use drugs.  No Known Allergies  Family History  Problem Relation Age of Onset   Diabetes Mother    GER disease Mother    Diabetes Father     Prior to Admission medications   Medication Sig Start Date End Date Taking? Authorizing Provider  ASPIRIN  LOW DOSE 81 MG EC tablet Take 1 tablet (81 mg total) by mouth daily. Patient taking differently: Take 81 mg by mouth at bedtime. 07/24/21  Yes Vernell Goldsmith, MD  atorvastatin (LIPITOR) 40 MG tablet Take 40 mg by mouth at bedtime. 09/02/23  Yes [provider]  Dulaglutide 1.5 MG/0.5ML SOAJ Inject 1.5 mg into the skin every Tuesday. 10/14/22  Yes [provider]  ferrous sulfate  300 (60 Fe) MG/5ML syrup Take 5 mLs (300 mg total) by mouth daily. 06/28/22  Yes Beatty, Celeste A, PA-C  insulin  glargine (LANTUS ) 100 UNIT/ML Solostar Pen Inject 6 Units into the skin 2 (two) times daily. Patient taking differently: Inject 16-60 Units into the skin See admin instructions. Inject 60 units into the skin in the morning and 16 units at bedtime 10/24/22  Yes Hall, Carole N, DO  omeprazole  (PRILOSEC) 20 MG capsule Take 1 capsule (20 mg total) by mouth daily. Patient taking differently: Take 20 mg by mouth daily before breakfast. 06/26/17  Yes Alejandro Amour B, PA-C  Accu-Chek Softclix Lancets lancets Use 3 (three) times daily as directed 08/08/21   Vernell Goldsmith, MD  glucose blood (ACCU-CHEK GUIDE) test strip Use 3 (three) times daily as instructed  07/03/21   Vernell Goldsmith, MD  Vitamin D, Ergocalciferol, (DRISDOL) 1.25 MG (50000 UNIT) CAPS capsule Take 50,000 Units by mouth once a week. Patient not taking: Reported on 09/24/2023 06/11/23   [provider]    Physical Exam: Vitals:   09/24/23 1049 09/24/23 1141 09/24/23 1330 09/24/23 1345  BP: (!) 148/74 (!) 152/85 (!) 122/92 (!) 147/77  Pulse: (!) 116 (!) 116 (!) 109 (!) 112  Resp: 18 19 15 19   Temp: 98.2 F (36.8 C) 97.8 F (36.6 C) (!) 97.5 F (36.4  C)   TempSrc:  Oral    SpO2: 100% 99% 100% 100%  Weight:  46 kg    Height:  4' 9 (1.448 m)     General: Alert, oriented x3, resting comfortably in no acute distress HEENT: EOMI, oropharynx clear, moist mucous membranes, hearing intact Neck: Trachea midline and no gross thyromegaly Respiratory: Lungs clear to auscultation bilaterally with normal respiratory effort; no w/r/r Cardiovascular: Regular rate and rhythm w/o m/r/g Abdomen: Soft, nontender, nondistended. Positive bowel sounds MSK: No obvious joint deformities or swelling Skin: No obvious rashes or lesions Neurologic: Awake, alert, spontaneously moves all extremities, strength intact Psychiatric: Appropriate mood and affect, conversational and cooperative  Data Reviewed:  Lab Results  Component Value Date   WBC 10.0 09/24/2023   HGB 12.7 09/24/2023   HCT 40.2 09/24/2023   MCV 87.4 09/24/2023   PLT 278 09/24/2023   Lab Results  Component Value Date   GLUCOSE 72 09/23/2023   CALCIUM 8.9 09/23/2023   NA 135 09/23/2023   K 3.9 09/23/2023   CO2 20 (L) 09/23/2023   CL 102 09/23/2023   BUN 15 09/23/2023   CREATININE 0.84 09/24/2023   Lab Results  Component Value Date   ALT 24 09/23/2023   AST 26 09/23/2023   ALKPHOS 90 09/23/2023   BILITOT 1.2 09/23/2023   Lab Results  Component Value Date   INR 1.0 12/17/2021   INR 1.0 01/16/2007    Radiology: CT ABDOMEN PELVIS W CONTRAST Result Date: 09/24/2023 CLINICAL DATA:  48 year old female with abdominal pain and nausea for 2 weeks. EXAM: CT ABDOMEN AND PELVIS WITH CONTRAST TECHNIQUE: Multidetector CT imaging of the abdomen and pelvis was performed using the standard protocol following bolus administration of intravenous contrast. RADIATION DOSE REDUCTION: This exam was performed according to the departmental dose-optimization program which includes automated exposure control, adjustment of the mA and/or kV according to patient size and/or use of iterative reconstruction  technique. CONTRAST:  75mL OMNIPAQUE  IOHEXOL  350 MG/ML SOLN COMPARISON:  CT Abdomen and Pelvis 07/24/2022. FINDINGS: Lower chest: Stable and normal. Hepatobiliary: Chronic cholecystectomy. Liver is within normal limits. Pancreas: Negative. Spleen: Negative. Adrenals/Urinary Tract: Normal adrenal glands. Fairly symmetric initial renal contrast enhancement. But asymmetric left hydronephrosis, left hydroureter, and delayed left renal contrast excretion compared to the right. Mild left urothelial thickening and enhancement in addition to hydroureter (series 3, image 37). And the left ureter seems to taper abruptly on series 8, image 28 where a punctate calcification can be identified, adjacent to the left gonadal vessels. Numerous pelvic phleboliths. No distal hydroureter identified. Moderately distended urinary bladder with mild bladder wall thickening, similar to last year. No evidence of pyelonephritis. Stomach/Bowel: Mostly decompressed large and small bowel loops throughout the abdomen and pelvis. Retained stool in the rectum. Decreased volume of visceral fat when compared to last year. And difficult to exclude generalized indistinct appearance of the bowel now. Decompressed distal stomach and duodenum. No pneumoperitoneum or free fluid.  Appendix not identified. Vascular/Lymphatic: Mild Calcified aortic atherosclerosis. Major vascular structures in the abdomen and pelvis are enhancing and appear to be patent. SMA calcified atherosclerosis also visible. Normal caliber abdominal aorta. No lymphadenopathy. Reproductive: Chronic severe lobulated and enlarged multi fibroid uterus with mildly increased size, and moderately increased heterogeneity since last year. The dominant solid fundal fibroid is now 57 mm diameter, 54 mm last year. Adjacent oval fibroids up to 4 cm now appear low-density and degenerated (series 3, image 59). No surrounding inflammation. Adnexa within normal limits. Other: No pelvis free fluid.  Musculoskeletal: Negative. IMPRESSION: 1. Left Hydronephrosis and proximal left Hydroureter. Delayed contrast excretion suggesting obstructive uropathy with a possible punctate obstructing left ureteral calculus located about 3 cm distal to the left ureterovesical junction. But there is also left urothelial, bladder wall mild thickening and enhancement. Differential diagnosis includes ascending urinary tract infection with pyonephrosis. No evidence of pyelonephritis. 2. Decreased volume of visceral fat since last year. Indistinct appearance of the bowel, but no discrete bowel inflammation. No evidence of bowel obstruction. 3. Severe Fibroid Uterus with increased size of dominant solid fundal fibroid since last year (now up to 57 mm), with smaller surrounding fibroids having centrally degenerated since last year. 4.  Aortic Atherosclerosis (ICD10-I70.0). Electronically Signed   By: Marlise Simpers M.D.   On: 09/24/2023 04:45    Assessment and Plan: 80F h/o intellectual disability, HTN, HLD, and DM2 p/w abdominal pain and found to have L hydronephrosis c/f pyelonephritis c/b nephrolithiasis.  L hydronephrosis c/b presumed pyelonephritis vs UTI and nephrolithiasis -Urology following; recs: anticipate L retrograde pyelogram and L nephrostomy tube placement -IV CTX daily for now; consider prolonged course as well as continuing oral abx until L ureteral calculi resolved -F/u urine and blood cultures and adjust abx   Advance Care Planning:   Code Status: Full Code   Consults: Urology  Family Communication: N/A  Severity of Illness: The appropriate patient status for this patient is INPATIENT. Inpatient status is judged to be reasonable and necessary in order to provide the required intensity of service to ensure the patient's safety. The patient's presenting symptoms, physical exam findings, and initial radiographic and laboratory data in the context of their chronic comorbidities is felt to place them at high  risk for further clinical deterioration. Furthermore, it is not anticipated that the patient will be medically stable for discharge from the hospital within 2 midnights of admission.   * I certify that at the point of admission it is my clinical judgment that the patient will require inpatient hospital care spanning beyond 2 midnights from the point of admission due to high intensity of service, high risk for further deterioration and high frequency of surveillance required.*   ------- I spent 55 minutes reviewing previous labs/notes, obtaining separate history at the bedside, counseling/discussing the treatment plan outlined above, ordering medications/tests, and performing clinical documentation.  Author: Arne Langdon, MD 09/24/2023 1:48 PM  For on call review [email or web address].

## 2023-09-25 ENCOUNTER — Encounter (HOSPITAL_COMMUNITY): Payer: Self-pay | Admitting: Urology

## 2023-09-25 DIAGNOSIS — E785 Hyperlipidemia, unspecified: Secondary | ICD-10-CM | POA: Insufficient documentation

## 2023-09-25 DIAGNOSIS — N1330 Unspecified hydronephrosis: Secondary | ICD-10-CM | POA: Insufficient documentation

## 2023-09-25 DIAGNOSIS — E119 Type 2 diabetes mellitus without complications: Secondary | ICD-10-CM

## 2023-09-25 DIAGNOSIS — N12 Tubulo-interstitial nephritis, not specified as acute or chronic: Secondary | ICD-10-CM | POA: Diagnosis not present

## 2023-09-25 DIAGNOSIS — Z794 Long term (current) use of insulin: Secondary | ICD-10-CM

## 2023-09-25 LAB — BASIC METABOLIC PANEL WITH GFR
Anion gap: 11 (ref 5–15)
BUN: 15 mg/dL (ref 6–20)
CO2: 20 mmol/L — ABNORMAL LOW (ref 22–32)
Calcium: 8.1 mg/dL — ABNORMAL LOW (ref 8.9–10.3)
Chloride: 105 mmol/L (ref 98–111)
Creatinine, Ser: 0.72 mg/dL (ref 0.44–1.00)
GFR, Estimated: >60 mL/min (ref >60)
Glucose, Bld: 175 mg/dL — ABNORMAL HIGH (ref 70–99)
Potassium: 4.1 mmol/L (ref 3.5–5.1)
Sodium: 136 mmol/L (ref 135–145)

## 2023-09-25 LAB — GLUCOSE, CAPILLARY
Glucose-Capillary: 124 mg/dL — ABNORMAL HIGH (ref 70–99)
Glucose-Capillary: 144 mg/dL — ABNORMAL HIGH (ref 70–99)
Glucose-Capillary: 149 mg/dL — ABNORMAL HIGH (ref 70–99)
Glucose-Capillary: 173 mg/dL — ABNORMAL HIGH (ref 70–99)
Glucose-Capillary: 208 mg/dL — ABNORMAL HIGH (ref 70–99)

## 2023-09-25 LAB — HEMOGLOBIN A1C
Hgb A1c MFr Bld: 7.8 % — ABNORMAL HIGH (ref 4.8–5.6)
Mean Plasma Glucose: 177.16 mg/dL

## 2023-09-25 MED ORDER — FENTANYL CITRATE PF 50 MCG/ML IJ SOSY
12.5000 ug | PREFILLED_SYRINGE | INTRAMUSCULAR | Status: DC | PRN
  Administered 2023-09-25 (×3): 25 ug via INTRAVENOUS
  Filled 2023-09-25 (×3): qty 1

## 2023-09-25 MED ORDER — INSULIN ASPART 100 UNIT/ML IJ SOLN
0.0000 [IU] | Freq: Three times a day (TID) | INTRAMUSCULAR | Status: DC
  Administered 2023-09-25: 5 [IU] via SUBCUTANEOUS
  Administered 2023-09-25 – 2023-09-27 (×5): 2 [IU] via SUBCUTANEOUS
  Administered 2023-09-28: 3 [IU] via SUBCUTANEOUS

## 2023-09-25 MED ORDER — INSULIN ASPART 100 UNIT/ML IJ SOLN
2.0000 [IU] | Freq: Three times a day (TID) | INTRAMUSCULAR | Status: DC
  Administered 2023-09-25 – 2023-09-28 (×5): 2 [IU] via SUBCUTANEOUS

## 2023-09-25 MED ORDER — FENTANYL CITRATE PF 50 MCG/ML IJ SOSY
50.0000 ug | PREFILLED_SYRINGE | INTRAMUSCULAR | Status: AC | PRN
  Administered 2023-09-25 (×2): 50 ug via INTRAVENOUS
  Filled 2023-09-25 (×2): qty 1

## 2023-09-25 MED ORDER — INSULIN ASPART 100 UNIT/ML IJ SOLN: 0.0000 [IU] | Freq: Three times a day (TID) | INTRAMUSCULAR | Status: DC

## 2023-09-25 MED ORDER — INSULIN GLARGINE-YFGN 100 UNIT/ML ~~LOC~~ SOLN
5.0000 [IU] | Freq: Two times a day (BID) | SUBCUTANEOUS | Status: DC
  Administered 2023-09-25 – 2023-09-28 (×7): 5 [IU] via SUBCUTANEOUS
  Filled 2023-09-25 (×8): qty 0.05

## 2023-09-25 MED ORDER — ALUM & MAG HYDROXIDE-SIMETH 200-200-20 MG/5ML PO SUSP
30.0000 mL | ORAL | Status: DC | PRN
  Filled 2023-09-25: qty 30

## 2023-09-25 MED ORDER — FENTANYL CITRATE PF 50 MCG/ML IJ SOSY: 25.0000 ug | PREFILLED_SYRINGE | INTRAMUSCULAR | Status: DC | PRN

## 2023-09-25 MED ORDER — OXYCODONE HCL 5 MG PO TABS
10.0000 mg | ORAL_TABLET | ORAL | Status: DC | PRN
  Administered 2023-09-26 – 2023-09-28 (×3): 10 mg via ORAL
  Filled 2023-09-25 (×5): qty 2

## 2023-09-25 MED ORDER — INSULIN ASPART 100 UNIT/ML IJ SOLN: 0.0000 [IU] | Freq: Every day | INTRAMUSCULAR | Status: DC

## 2023-09-25 NOTE — Progress Notes (Signed)
 [facility] PROGRESS NOTE    Progress Note  [name]  WGN:[id number] DOB: 05-Jul-1975 DOA: 09/23/2023 PCP: Benn Brash, PA-C     Brief Narrative:   [name] is an 48 y.o. female past medical history of intellectual disability, essential hypertension hyperlipidemia diabetes mellitus type 2 comes in complaining of abdominal pain to have left hydronephrosis with pyonephritis CT scan of the abdomen pelvis also showed left ureteral calculi about 3 cm and several uterine fibroids mildly increased in size and is mostly.  Urology was consulted   Assessment/Plan:   Left-sided hydronephrosis due to 3 cm nephrolithiasis and possibly pyelonephritis: Urology was consulted recommended cystoscopy with left retrograde pyelogram and insertion of left ureteral stent. Blood cultures were obtained. She was started empirically on IV Rocephin . She has remained afebrile with no leukocytosis. Creatinine is remaining relatively stable.  DM (diabetes mellitus) type 2, uncontrolled,  Insulin -requiring or dependent type II diabetes mellitus (HCC) Check an A1c, low-carb modified diet. Started on low-dose long-acting insulin  and sliding scale insulin .  Protein-calorie malnutrition, severe (HCC) Noted.  Intellectual disability Noted.  Hyperlipidemia Continue statins.  DVT prophylaxis: lovenox  Family Communication:none Status is: Inpatient Remains inpatient appropriate because: Left-sided hydronephrosis with pyelonephritis    Code Status:     Code Status Orders  (From admission, onward)           Start     Ordered   09/24/23 0758  Full code  Continuous       Question:  By:  Answer:  Consent: discussion documented in EHR   09/24/23 0757           Code Status History     Date Active Date Inactive Code Status Order ID Comments User Context   10/22/2022 0341 10/24/2022 2016 Full Code [id number]  Walton Guppy, MD ED   12/17/2021 2349 12/18/2021 1511 Full  Code [id number]  Walton Guppy, MD ED   02/09/2017 2227 02/11/2017 0053 Full Code [id number]  Opyd, Santana Cue, MD ED         IV Access:   Peripheral IV   Procedures and diagnostic studies:   DG C-Arm 1-60 Min Result Date: 09/24/2023 CLINICAL DATA:  Cystoscopy and stent placement EXAM: DG C-ARM 1-60 MIN COMPARISON:  09/24/2023 FINDINGS: Six fluoroscopic images are obtained during the performance of the procedure and are provided for interpretation only. The left ureter is cannulated and opacified, with placement of a left ureteral stent extending from the left renal pelvis the bladder lumen. Please refer to the operative report. Fluoroscopy time: 52 seconds, 7.24 mGy IMPRESSION: 1. Retrograde left pyelogram and left ureteral stent placement as above. Please refer to the operative report. Electronically Signed   By: Bobbye Burrow M.D.   On: 09/24/2023 17:07   CT ABDOMEN PELVIS W CONTRAST Result Date: 09/24/2023 CLINICAL DATA:  48 year old female with abdominal pain and nausea for 2 weeks. EXAM: CT ABDOMEN AND PELVIS WITH CONTRAST TECHNIQUE: Multidetector CT imaging of the abdomen and pelvis was performed using the standard protocol following bolus administration of intravenous contrast. RADIATION DOSE REDUCTION: This exam was performed according to the departmental dose-optimization program which includes automated exposure control, adjustment of the mA and/or kV according to patient size and/or use of iterative reconstruction technique. CONTRAST:  75mL OMNIPAQUE  IOHEXOL  350 MG/ML SOLN COMPARISON:  CT Abdomen and Pelvis 07/24/2022. FINDINGS: Lower chest: Stable and normal. Hepatobiliary: Chronic cholecystectomy. Liver is within normal limits. Pancreas: Negative. Spleen: Negative. Adrenals/Urinary Tract: Normal adrenal glands. Fairly symmetric initial renal contrast enhancement.  But asymmetric left hydronephrosis, left hydroureter, and delayed left renal contrast excretion compared to the right. Mild  left urothelial thickening and enhancement in addition to hydroureter (series 3, image 37). And the left ureter seems to taper abruptly on series 8, image 28 where a punctate calcification can be identified, adjacent to the left gonadal vessels. Numerous pelvic phleboliths. No distal hydroureter identified. Moderately distended urinary bladder with mild bladder wall thickening, similar to last year. No evidence of pyelonephritis. Stomach/Bowel: Mostly decompressed large and small bowel loops throughout the abdomen and pelvis. Retained stool in the rectum. Decreased volume of visceral fat when compared to last year. And difficult to exclude generalized indistinct appearance of the bowel now. Decompressed distal stomach and duodenum. No pneumoperitoneum or free fluid. Appendix not identified. Vascular/Lymphatic: Mild Calcified aortic atherosclerosis. Major vascular structures in the abdomen and pelvis are enhancing and appear to be patent. SMA calcified atherosclerosis also visible. Normal caliber abdominal aorta. No lymphadenopathy. Reproductive: Chronic severe lobulated and enlarged multi fibroid uterus with mildly increased size, and moderately increased heterogeneity since last year. The dominant solid fundal fibroid is now 57 mm diameter, 54 mm last year. Adjacent oval fibroids up to 4 cm now appear low-density and degenerated (series 3, image 59). No surrounding inflammation. Adnexa within normal limits. Other: No pelvis free fluid. Musculoskeletal: Negative. IMPRESSION: 1. Left Hydronephrosis and proximal left Hydroureter. Delayed contrast excretion suggesting obstructive uropathy with a possible punctate obstructing left ureteral calculus located about 3 cm distal to the left ureterovesical junction. But there is also left urothelial, bladder wall mild thickening and enhancement. Differential diagnosis includes ascending urinary tract infection with pyonephrosis. No evidence of pyelonephritis. 2. Decreased  volume of visceral fat since last year. Indistinct appearance of the bowel, but no discrete bowel inflammation. No evidence of bowel obstruction. 3. Severe Fibroid Uterus with increased size of dominant solid fundal fibroid since last year (now up to 57 mm), with smaller surrounding fibroids having centrally degenerated since last year. 4.  Aortic Atherosclerosis (ICD10-I70.0). Electronically Signed   By: Marlise Simpers M.D.   On: 09/24/2023 04:45     Medical Consultants:   None.   Subjective:    Lani Pique complaining of flank pain  Objective:    Vitals:   09/24/23 1652 09/24/23 1835 09/24/23 2149 09/25/23 0530  BP: (!) 163/94 (!) 141/88 125/60 136/69  Pulse: (!) 108 92 (!) 108 (!) 102  Resp: 18 18 18 18   Temp: 98.1 F (36.7 C)  98.3 F (36.8 C) 97.7 F (36.5 C)  TempSrc:   Oral Oral  SpO2: 100% 96% 100% 100%  Weight:      Height:       SpO2: 100 %   Intake/Output Summary (Last 24 hours) at 09/25/2023 0704 Last data filed at 09/24/2023 2345 Gross per 24 hour  Intake 2734.59 ml  Output 1 ml  Net 2733.59 ml   Filed Weights   09/24/23 0800 09/24/23 1141  Weight: 46 kg 46 kg    Exam: General exam: In no acute distress. Respiratory system: Good air movement and clear to auscultation. Cardiovascular system: S1 & S2 heard, RRR. No JVD.  Gastrointestinal system: Abdomen is nondistended, soft and nontender.  Extremities: Congenital abnormalities right hand Skin: No rashes, lesions or ulcers Psychiatry: No judgment or insight of medical condition   Data Reviewed:    Labs: Basic Metabolic Panel: Recent Labs  Lab 09/23/23 2024 09/24/23 1120  NA 135  --   K 3.9  --  CL 102  --   CO2 20*  --   GLUCOSE 72  --   BUN 15  --   CREATININE 0.74 0.84  CALCIUM 8.9  --    GFR Estimated Creatinine Clearance: 49.9 mL/min (by C-G formula based on SCr of 0.84 mg/dL). Liver Function Tests: Recent Labs  Lab 09/23/23 2024  AST 26  ALT 24  ALKPHOS 90  BILITOT 1.2   PROT 8.4*  ALBUMIN 3.8   Recent Labs  Lab 09/23/23 2024  LIPASE 25   No results for input(s): AMMONIA in the last 168 hours. Coagulation profile No results for input(s): INR, PROTIME in the last 168 hours. COVID-19 Labs  No results for input(s): DDIMER, FERRITIN, LDH, CRP in the last 72 hours.  Lab Results  Component Value Date   SARSCOV2NAA Not Detected 03/11/2019   SARSCOV2NAA Not Detected 09/10/2018    CBC: Recent Labs  Lab 09/23/23 2024 09/24/23 1120  WBC 7.4 10.0  HGB 14.0 12.7  HCT 42.9 40.2  MCV 85.8 87.4  PLT 349 278   Cardiac Enzymes: No results for input(s): CKTOTAL, CKMB, CKMBINDEX, TROPONINI in the last 168 hours. BNP (last 3 results) No results for input(s): PROBNP in the last 8760 hours. CBG: Recent Labs  Lab 09/24/23 0606 09/24/23 1105 09/24/23 1338 09/25/23 0023  GLUCAP 187* 238* 212* 173*   D-Dimer: No results for input(s): DDIMER in the last 72 hours. Hgb A1c: No results for input(s): HGBA1C in the last 72 hours. Lipid Profile: No results for input(s): CHOL, HDL, LDLCALC, TRIG, CHOLHDL, LDLDIRECT in the last 72 hours. Thyroid function studies: No results for input(s): TSH, T4TOTAL, T3FREE, THYROIDAB in the last 72 hours.  Invalid input(s): FREET3 Anemia work up: No results for input(s): VITAMINB12, FOLATE, FERRITIN, TIBC, IRON, RETICCTPCT in the last 72 hours. Sepsis Labs: Recent Labs  Lab 09/23/23 2024 09/24/23 0605 09/24/23 1120  WBC 7.4  --  10.0  LATICACIDVEN  --  2.2*  --    Microbiology Recent Results (from the past 240 hours)  Culture, blood (Routine X 2) w Reflex to ID Panel     Status: None (Preliminary result)   Collection Time: 09/24/23  5:43 AM   Specimen: BLOOD  Result Value Ref Range Status   Specimen Description BLOOD RIGHT ANTECUBITAL  Final   Special Requests   Final    BOTTLES DRAWN AEROBIC AND ANAEROBIC Blood Culture adequate volume   Culture    Final    NO GROWTH <12 HOURS Performed at [facility], [address] [place], [place] [address]    Report Status PENDING  Incomplete  Culture, blood (Routine X 2) w Reflex to ID Panel     Status: None (Preliminary result)   Collection Time: 09/24/23  5:46 AM   Specimen: BLOOD LEFT HAND  Result Value Ref Range Status   Specimen Description BLOOD LEFT HAND  Final   Special Requests   Final    BOTTLES DRAWN AEROBIC ONLY Blood Culture results may not be optimal due to an inadequate volume of blood received in culture bottles   Culture   Final    NO GROWTH <12 HOURS Performed at [facility], [address], [place], [place] [address]    Report Status PENDING  Incomplete     Medications:    enoxaparin  (LOVENOX ) injection  40 mg Subcutaneous Q24H   insulin  aspart  0-5 Units Subcutaneous QHS   insulin  aspart  0-6 Units Subcutaneous TID WC  Continuous Infusions:  cefTRIAXone  (ROCEPHIN )  IV        LOS: 1 day   Macdonald Savoy  [facility]  09/25/2023, 7:04 AM

## 2023-09-25 NOTE — TOC CM/SW Note (Signed)
 Transition of Care [facility]) - Inpatient Brief Assessment   Patient Details  Name: [name] MRN: [id number] Date of Birth: 11-23-1975  Transition of Care [facility]) CM/SW Contact:    Tom-Johnson, Allsion Nogales Daphne, RN Phone Number: 09/25/2023, 2:15 PM   Clinical Narrative:  Patient presented to the ED with worsening Abdominal pains. Found to have Lt Hydronephrosis, Pyelonephritis, CT Abdomen/Pelvis showed Lt Ureteral Calculi and several uterine Fibroids.  Patient underwent Cystoscopy with Lt Retrograde Pyelogram and insertion of Lt Ureteral Stent yesterday 09/24/23 by Urology. Continues on IV abx.   From home alone. Does not have children. Father not supportive. Has a supportive brother. Not employed, on SSI. Does not drive, uses Medicaid transportation to and from appointments. Does not have DME's at home.  PCP is Benn Brash, PA-C and uses [employer].  Patient requests Cab voucher at discharge.   Patient not Medically ready for discharge.  CM will continue to follow as patient progresses with care towards discharge.            Assessment/Plan:    Left-sided hydronephrosis due to 3 cm nephrolithiasis and possibly pyelonephritis: Urology was consulted recommended Blood cultures were obtained.  Transition of Care Asessment: Insurance and Status: Insurance coverage has been reviewed Patient has primary care physician: Yes Home environment has been reviewed: Yes Prior level of function:: Independent Prior/Current Home Services: No current home services Social Drivers of Health Review: SDOH reviewed no interventions necessary Readmission risk has been reviewed: Yes Transition of care needs: transition of care needs identified, TOC will continue to follow

## 2023-09-25 NOTE — Anesthesia Postprocedure Evaluation (Signed)
 Anesthesia Post Note  Patient: [name]  Procedure(s) Performed: CYSTOSCOPY, WITH RETROGRADE PYELOGRAM AND URETERAL STENT INSERTION (Left: Urethra)     Patient location during evaluation: PACU Anesthesia Type: General Level of consciousness: awake and alert Pain management: pain level controlled Vital Signs Assessment: post-procedure vital signs reviewed and stable Respiratory status: spontaneous breathing, nonlabored ventilation, respiratory function stable and patient connected to nasal cannula oxygen Cardiovascular status: blood pressure returned to baseline and stable Postop Assessment: no apparent nausea or vomiting Anesthetic complications: no   No notable events documented.  Last Vitals:  Vitals:   09/25/23 1620 09/25/23 2038  BP: (!) 172/96 (!) 161/88  Pulse: (!) 111 (!) 107  Resp: 17 20  Temp: 36.7 C 36.9 C  SpO2: 100% 100%    Last Pain:  Vitals:   09/25/23 2038  TempSrc: Oral  PainSc:                  Melvenia Stabs

## 2023-09-25 NOTE — Discharge Instructions (Addendum)
 Be sure to follow-up with Dr. Willye Harvey to ensure removal of the stent/stone. [phone]

## 2023-09-25 NOTE — Progress Notes (Signed)
 1 Day Post-Op Subjective: Patient resting comfortably in bed on rounds.  More conversational today and her developmental delays are much more obvious.  We did review the case and plan.  She insist that she gets her self to her doctors office and that no one else helps her with her scheduling.  We will make sure that she has this on discharge and see if information on her facility can be obtained.  Objective: Vital signs in last 24 hours: Temp:  [97.5 F (36.4 C)-98.3 F (36.8 C)] 98 F (36.7 C) (06/20 0749) Pulse Rate:  [92-112] 108 (06/20 0749) Resp:  [13-19] 19 (06/20 0749) BP: (117-163)/(60-95) 145/95 (06/20 0749) SpO2:  [95 %-100 %] 100 % (06/20 0749)  Assessment/Plan: #Left hydronephrosis #Left flank pain #Possible proximal left punctate stone #Large uterine fibroid #UTI   Remain NPO To the OR with Dr. Willye Harvey for ureteral stent placement this afternoon UA suggestive of UTI, broad ABX while awaiting speciation and sensitivities. Labs and vitals WNL. Lactic Acidosis. Outpatient follow-up for ureteroscopy/definitive stone management with Dr. Willye Harvey in 2 to 3 weeks after she is cleared infection. Patient is complaining of significant epigastric pain, stating this was her initial problem and that she has a history of acid reflux.  Nursing is contacting her primary physician. Urology will follow along peripherally.  Please call with questions or concerns  Intake/Output from previous day: 06/19 0701 - 06/20 0700 In: 2734.6 [P.O.:720; I.V.:1914.6; IV Piggyback:100] Out: 1 [Blood:1]  Intake/Output this shift: Total I/O In: 236 [P.O.:236] Out: -   Physical Exam:  General: Alert and oriented CV: No cyanosis Lungs: equal chest rise Abdomen: Significant epigastric pain   Lab Results: Recent Labs    09/23/23 2024 09/24/23 1120  HGB 14.0 12.7  HCT 42.9 40.2   BMET Recent Labs    09/23/23 2024 09/24/23 1120 09/25/23 0655  NA 135  --  136  K 3.9  --  4.1   CL 102  --  105  CO2 20*  --  20*  GLUCOSE 72  --  175*  BUN 15  --  15  CREATININE 0.74 0.84 0.72  CALCIUM 8.9  --  8.1*  HGB 14.0 12.7  --   WBC 7.4 10.0  --      Studies/Results: DG C-Arm 1-60 Min Result Date: 09/24/2023 CLINICAL DATA:  Cystoscopy and stent placement EXAM: DG C-ARM 1-60 MIN COMPARISON:  09/24/2023 FINDINGS: Six fluoroscopic images are obtained during the performance of the procedure and are provided for interpretation only. The left ureter is cannulated and opacified, with placement of a left ureteral stent extending from the left renal pelvis the bladder lumen. Please refer to the operative report. Fluoroscopy time: 52 seconds, 7.24 mGy IMPRESSION: 1. Retrograde left pyelogram and left ureteral stent placement as above. Please refer to the operative report. Electronically Signed   By: Bobbye Burrow M.D.   On: 09/24/2023 17:07   CT ABDOMEN PELVIS W CONTRAST Result Date: 09/24/2023 CLINICAL DATA:  48 year old female with abdominal pain and nausea for 2 weeks. EXAM: CT ABDOMEN AND PELVIS WITH CONTRAST TECHNIQUE: Multidetector CT imaging of the abdomen and pelvis was performed using the standard protocol following bolus administration of intravenous contrast. RADIATION DOSE REDUCTION: This exam was performed according to the departmental dose-optimization program which includes automated exposure control, adjustment of the mA and/or kV according to patient size and/or use of iterative reconstruction technique. CONTRAST:  75mL OMNIPAQUE  IOHEXOL  350 MG/ML SOLN COMPARISON:  CT Abdomen and Pelvis 07/24/2022.  FINDINGS: Lower chest: Stable and normal. Hepatobiliary: Chronic cholecystectomy. Liver is within normal limits. Pancreas: Negative. Spleen: Negative. Adrenals/Urinary Tract: Normal adrenal glands. Fairly symmetric initial renal contrast enhancement. But asymmetric left hydronephrosis, left hydroureter, and delayed left renal contrast excretion compared to the right. Mild left  urothelial thickening and enhancement in addition to hydroureter (series 3, image 37). And the left ureter seems to taper abruptly on series 8, image 28 where a punctate calcification can be identified, adjacent to the left gonadal vessels. Numerous pelvic phleboliths. No distal hydroureter identified. Moderately distended urinary bladder with mild bladder wall thickening, similar to last year. No evidence of pyelonephritis. Stomach/Bowel: Mostly decompressed large and small bowel loops throughout the abdomen and pelvis. Retained stool in the rectum. Decreased volume of visceral fat when compared to last year. And difficult to exclude generalized indistinct appearance of the bowel now. Decompressed distal stomach and duodenum. No pneumoperitoneum or free fluid. Appendix not identified. Vascular/Lymphatic: Mild Calcified aortic atherosclerosis. Major vascular structures in the abdomen and pelvis are enhancing and appear to be patent. SMA calcified atherosclerosis also visible. Normal caliber abdominal aorta. No lymphadenopathy. Reproductive: Chronic severe lobulated and enlarged multi fibroid uterus with mildly increased size, and moderately increased heterogeneity since last year. The dominant solid fundal fibroid is now 57 mm diameter, 54 mm last year. Adjacent oval fibroids up to 4 cm now appear low-density and degenerated (series 3, image 59). No surrounding inflammation. Adnexa within normal limits. Other: No pelvis free fluid. Musculoskeletal: Negative. IMPRESSION: 1. Left Hydronephrosis and proximal left Hydroureter. Delayed contrast excretion suggesting obstructive uropathy with a possible punctate obstructing left ureteral calculus located about 3 cm distal to the left ureterovesical junction. But there is also left urothelial, bladder wall mild thickening and enhancement. Differential diagnosis includes ascending urinary tract infection with pyonephrosis. No evidence of pyelonephritis. 2. Decreased volume  of visceral fat since last year. Indistinct appearance of the bowel, but no discrete bowel inflammation. No evidence of bowel obstruction. 3. Severe Fibroid Uterus with increased size of dominant solid fundal fibroid since last year (now up to 57 mm), with smaller surrounding fibroids having centrally degenerated since last year. 4.  Aortic Atherosclerosis (ICD10-I70.0). Electronically Signed   By: Marlise Simpers M.D.   On: 09/24/2023 04:45      LOS: 1 day   Alla Ar, NP [facility] Pager: [phone]  09/25/2023, 12:15 PM

## 2023-09-26 DIAGNOSIS — N1330 Unspecified hydronephrosis: Secondary | ICD-10-CM | POA: Diagnosis not present

## 2023-09-26 DIAGNOSIS — N12 Tubulo-interstitial nephritis, not specified as acute or chronic: Secondary | ICD-10-CM | POA: Diagnosis not present

## 2023-09-26 DIAGNOSIS — E119 Type 2 diabetes mellitus without complications: Secondary | ICD-10-CM | POA: Diagnosis not present

## 2023-09-26 DIAGNOSIS — Z794 Long term (current) use of insulin: Secondary | ICD-10-CM | POA: Diagnosis not present

## 2023-09-26 LAB — GLUCOSE, CAPILLARY
Glucose-Capillary: 149 mg/dL — ABNORMAL HIGH (ref 70–99)
Glucose-Capillary: 104 mg/dL — ABNORMAL HIGH (ref 70–99)
Glucose-Capillary: 121 mg/dL — ABNORMAL HIGH (ref 70–99)
Glucose-Capillary: 104 mg/dL — ABNORMAL HIGH (ref 70–99)

## 2023-09-26 MED ORDER — PANTOPRAZOLE SODIUM 40 MG PO TBEC
40.0000 mg | DELAYED_RELEASE_TABLET | Freq: Two times a day (BID) | ORAL | Status: DC
  Administered 2023-09-26 – 2023-09-28 (×3): 40 mg via ORAL
  Filled 2023-09-26 (×4): qty 1

## 2023-09-26 MED ORDER — FLUCONAZOLE IN SODIUM CHLORIDE 200-0.9 MG/100ML-% IV SOLN
200.0000 mg | INTRAVENOUS | Status: DC
  Administered 2023-09-26: 200 mg via INTRAVENOUS
  Filled 2023-09-26 (×3): qty 100

## 2023-09-26 MED ORDER — PANTOPRAZOLE SODIUM 40 MG PO TBEC: 40.0000 mg | DELAYED_RELEASE_TABLET | Freq: Two times a day (BID) | ORAL | Status: DC

## 2023-09-26 MED ORDER — FENTANYL CITRATE PF 50 MCG/ML IJ SOSY
50.0000 ug | PREFILLED_SYRINGE | INTRAMUSCULAR | Status: DC | PRN
  Administered 2023-09-26 (×4): 50 ug via INTRAVENOUS
  Filled 2023-09-26 (×4): qty 1

## 2023-09-26 MED ORDER — PANTOPRAZOLE SODIUM 40 MG IV SOLR
40.0000 mg | Freq: Two times a day (BID) | INTRAVENOUS | Status: DC
  Administered 2023-09-26: 40 mg via INTRAVENOUS
  Filled 2023-09-26: qty 10

## 2023-09-26 NOTE — Evaluation (Signed)
 Clinical/Bedside Swallow Evaluation Patient Details  Name: [name] MRN: [id number] Date of Birth: 03-15-1976  Today's Date: 09/26/2023 Time: SLP Start Time (ACUTE ONLY): 1315 SLP Stop Time (ACUTE ONLY): 1330 SLP Time Calculation (min) (ACUTE ONLY): 15 min  Past Medical History:  Past Medical History:  Diagnosis Date   Diabetes mellitus without complication (HCC)    DiGeorge syndrome (HCC)    Ectrodactyly    Genetic defect    genetic mutation of unknown significance per [facility] genetic notes   GERD (gastroesophageal reflux disease)    Hypercholesterolemia    Hypertension    Intellectual disability    Microcephaly (HCC)    Pelvic mass    Ventral hernia    Past Surgical History:  Past Surgical History:  Procedure Laterality Date   CHOLECYSTECTOMY     CYSTOSCOPY W/ URETERAL STENT PLACEMENT Left 09/24/2023   Procedure: CYSTOSCOPY, WITH RETROGRADE PYELOGRAM AND URETERAL STENT INSERTION;  Surgeon: Roseann Adine PARAS., MD;  Location: [facility] OR;  Service: Urology;  Laterality: Left;   HAND SURGERY Right    HERNIA REPAIR     HPI:  [name] is an 48 y.o. female past medical history of intellectual disability, essential hypertension hyperlipidemia diabetes mellitus type 2 comes in complaining of abdominal pain to have left hydronephrosis with pyonephritis CT scan of the abdomen pelvis also showed left ureteral calculi about 3 cm and several uterine fibroids mildly increased in size and is mostly.  Urology was consulted. Per MD note. Swallow eval ordered.    Assessment / Plan / Recommendation  Clinical Impression  Pt demonstrates functional oropharyngeal swallow ability based on clinical swallow evaluation and limited po pt would accept. Her nausea limited her intake to just a few sips and a single small bite. No focal CN deficits noted nor s/s of aspiration/dysphagia.  Pt reports she has poor intake at home as she doesn't have an appetite.She took her medications here with  applesauce without difficulty per her report.   Recommend continue diet as tolerated. No SLP follow up needed. Thanks for this consult. SLP Visit Diagnosis: Dysphagia, unspecified (R13.10)    Aspiration Risk  No limitations    Diet Recommendation Regular;Thin liquid    Liquid Administration via: Cup;Straw Medication Administration: Other (Comment) (as tolerated) Supervision: Patient able to self feed Compensations: Slow rate;Small sips/bites Postural Changes: Seated upright at 90 degrees;Remain upright for at least 30 minutes after po intake    Other  Recommendations Oral Care Recommendations: Oral care BID     Assistance Recommended at Discharge    Functional Status Assessment    Frequency and Duration            Prognosis        Swallow Study   General Date of Onset: 09/26/23 HPI: [name] is an 48 y.o. female past medical history of intellectual disability, essential hypertension hyperlipidemia diabetes mellitus type 2 comes in complaining of abdominal pain to have left hydronephrosis with pyonephritis CT scan of the abdomen pelvis also showed left ureteral calculi about 3 cm and several uterine fibroids mildly increased in size and is mostly.  Urology was consulted. Diet Prior to this Study: Regular;Thin liquids (Level 0) Temperature Spikes Noted: No Respiratory Status: Room air History of Recent Intubation: No Behavior/Cognition: Alert;Cooperative;Pleasant mood Oral Cavity Assessment: Within Functional Limits Oral Care Completed by SLP: No Oral Cavity - Dentition: Adequate natural dentition Vision: Functional for self-feeding Self-Feeding Abilities: Able to feed self Patient Positioning: Upright in bed Baseline Vocal Quality: Normal Volitional Cough:  Weak Volitional Swallow:  (DNT, pt reports ongoing nausea)    Oral/Motor/Sensory Function Overall Oral Motor/Sensory Function: Within functional limits   Ice Chips Ice chips: Within functional limits    Thin Liquid Thin Liquid: Within functional limits Presentation: Cup;Straw    Nectar Thick Nectar Thick Liquid: Not tested   Honey Thick Honey Thick Liquid: Not tested   Puree Puree: Not tested   Solid     Solid: Within functional limits Presentation: Self Fed Other Comments: for very small bolus size      [name] 09/26/2023,1:56 PM  Madelin POUR, MS [facility] SLP Acute Rehab Services Office [phone]

## 2023-09-26 NOTE — Progress Notes (Signed)
 [facility] PROGRESS NOTE    Progress Note  [name]  FMW:[id number] DOB: 04-28-1975 DOA: 09/23/2023 PCP: Neita Dover, PA-C     Brief Narrative:   [name] is an 48 y.o. female past medical history of intellectual disability, essential hypertension hyperlipidemia diabetes mellitus type 2 comes in complaining of abdominal pain to have left hydronephrosis with pyonephritis CT scan of the abdomen pelvis also showed left ureteral calculi about 3 cm and several uterine fibroids mildly increased in size and is mostly.  Urology was consulted   Assessment/Plan:   Left-sided hydronephrosis due to 3 cm nephrolithiasis and possibly pyelonephritis: Urology was consulted recommended cystoscopy with left retrograde pyelogram and insertion of left ureteral stent. Blood cultures negative till date. Continue IV Rocephin . She has remained afebrile with no leukocytosis. Creatinine is remaining relatively stable. She relates her pain is not controlled continue narcotics Zofran  for nausea. She does not have gallbladder LFTs unremarkable  DM (diabetes mellitus) type 2, uncontrolled,  Insulin -requiring or dependent type II diabetes mellitus (HCC) Last A1c of 9.8. Continue long-acting insulin  per sliding scale.  Protein-calorie malnutrition, severe (HCC) Noted.  Intellectual disability Noted.  Hyperlipidemia Continue statins.  DVT prophylaxis: lovenox  Family Communication:none Status is: Inpatient Remains inpatient appropriate because: Left-sided hydronephrosis with pyelonephritis    Code Status:     Code Status Orders  (From admission, onward)           Start     Ordered   09/24/23 0758  Full code  Continuous       Question:  By:  Answer:  Consent: discussion documented in EHR   09/24/23 0757           Code Status History     Date Active Date Inactive Code Status Order ID Comments User Context   10/22/2022 0341 10/24/2022 2016 Full Code  [id number]  Charlton Evalene RAMAN, MD ED   12/17/2021 2349 12/18/2021 1511 Full Code [id number]  Charlton Evalene RAMAN, MD ED   02/09/2017 2227 02/11/2017 0053 Full Code [id number]  Opyd, Evalene RAMAN, MD ED         IV Access:   Peripheral IV   Procedures and diagnostic studies:   DG C-Arm 1-60 Min Result Date: 09/24/2023 CLINICAL DATA:  Cystoscopy and stent placement EXAM: DG C-ARM 1-60 MIN COMPARISON:  09/24/2023 FINDINGS: Six fluoroscopic images are obtained during the performance of the procedure and are provided for interpretation only. The left ureter is cannulated and opacified, with placement of a left ureteral stent extending from the left renal pelvis the bladder lumen. Please refer to the operative report. Fluoroscopy time: 52 seconds, 7.24 mGy IMPRESSION: 1. Retrograde left pyelogram and left ureteral stent placement as above. Please refer to the operative report. Electronically Signed   By: Ozell Daring M.D.   On: 09/24/2023 17:07     Medical Consultants:   None.   Subjective:    [name] continues to complain of right flank pain  Objective:    Vitals:   09/25/23 1620 09/25/23 2038 09/26/23 0015 09/26/23 0425  BP: (!) 172/96 (!) 161/88 130/79 (!) 140/90  Pulse: (!) 111 (!) 107 (!) 109 (!) 108  Resp: 17 20 14 18   Temp: 98 F (36.7 C) 98.5 F (36.9 C) 98.3 F (36.8 C) (!) 97.4 F (36.3 C)  TempSrc: Oral Oral Oral Oral  SpO2: 100% 100% 100% 100%  Weight:      Height:       SpO2: 100 %   Intake/Output Summary (Last  24 hours) at 09/26/2023 0729 Last data filed at 09/25/2023 0900 Gross per 24 hour  Intake 236 ml  Output --  Net 236 ml   Filed Weights   09/24/23 0800 09/24/23 1141  Weight: 46 kg 46 kg    Exam: General exam: In no acute distress. Respiratory system: Good air movement and clear to auscultation. Cardiovascular system: S1 & S2 heard, RRR. No JVD. Gastrointestinal system: Abdomen is nondistended, soft and nontender.  Extremities: No pedal  edema. Skin: No rashes, lesions or ulcers  Data Reviewed:    Labs: Basic Metabolic Panel: Recent Labs  Lab 09/23/23 2024 09/24/23 1120 09/25/23 0655  NA 135  --  136  K 3.9  --  4.1  CL 102  --  105  CO2 20*  --  20*  GLUCOSE 72  --  175*  BUN 15  --  15  CREATININE 0.74 0.84 0.72  CALCIUM 8.9  --  8.1*   GFR Estimated Creatinine Clearance: 52.4 mL/min (by C-G formula based on SCr of 0.72 mg/dL). Liver Function Tests: Recent Labs  Lab 09/23/23 2024  AST 26  ALT 24  ALKPHOS 90  BILITOT 1.2  PROT 8.4*  ALBUMIN 3.8   Recent Labs  Lab 09/23/23 2024  LIPASE 25   No results for input(s): AMMONIA in the last 168 hours. Coagulation profile No results for input(s): INR, PROTIME in the last 168 hours. COVID-19 Labs  No results for input(s): DDIMER, FERRITIN, LDH, CRP in the last 72 hours.  Lab Results  Component Value Date   SARSCOV2NAA Not Detected 03/11/2019   SARSCOV2NAA Not Detected 09/10/2018    CBC: Recent Labs  Lab 09/23/23 2024 09/24/23 1120  WBC 7.4 10.0  HGB 14.0 12.7  HCT 42.9 40.2  MCV 85.8 87.4  PLT 349 278   Cardiac Enzymes: No results for input(s): CKTOTAL, CKMB, CKMBINDEX, TROPONINI in the last 168 hours. BNP (last 3 results) No results for input(s): PROBNP in the last 8760 hours. CBG: Recent Labs  Lab 09/25/23 0023 09/25/23 0747 09/25/23 1212 09/25/23 1620 09/25/23 2041  GLUCAP 173* 208* 124* 149* 144*   D-Dimer: No results for input(s): DDIMER in the last 72 hours. Hgb A1c: Recent Labs    09/25/23 0655  HGBA1C 7.8*   Lipid Profile: No results for input(s): CHOL, HDL, LDLCALC, TRIG, CHOLHDL, LDLDIRECT in the last 72 hours. Thyroid function studies: No results for input(s): TSH, T4TOTAL, T3FREE, THYROIDAB in the last 72 hours.  Invalid input(s): FREET3 Anemia work up: No results for input(s): VITAMINB12, FOLATE, FERRITIN, TIBC, IRON, RETICCTPCT in the last 72  hours. Sepsis Labs: Recent Labs  Lab 09/23/23 2024 09/24/23 0605 09/24/23 1120  WBC 7.4  --  10.0  LATICACIDVEN  --  2.2*  --    Microbiology Recent Results (from the past 240 hours)  Culture, blood (Routine X 2) w Reflex to ID Panel     Status: None (Preliminary result)   Collection Time: 09/24/23  5:43 AM   Specimen: BLOOD  Result Value Ref Range Status   Specimen Description BLOOD RIGHT ANTECUBITAL  Final   Special Requests   Final    BOTTLES DRAWN AEROBIC AND ANAEROBIC Blood Culture adequate volume   Culture   Final    NO GROWTH 1 DAY Performed at [facility], [address] [place], [place] [address]    Report Status PENDING  Incomplete  Culture, blood (Routine X 2) w Reflex to ID Panel     Status:  None (Preliminary result)   Collection Time: 09/24/23  5:46 AM   Specimen: BLOOD LEFT HAND  Result Value Ref Range Status   Specimen Description BLOOD LEFT HAND  Final   Special Requests   Final    BOTTLES DRAWN AEROBIC ONLY Blood Culture results may not be optimal due to an inadequate volume of blood received in culture bottles   Culture   Final    NO GROWTH 1 DAY Performed at [facility], [address] [place], [place] [address]    Report Status PENDING  Incomplete     Medications:    enoxaparin  (LOVENOX ) injection  40 mg Subcutaneous Q24H   insulin  aspart  0-15 Units Subcutaneous TID WC   insulin  aspart  0-5 Units Subcutaneous QHS   insulin  aspart  2 Units Subcutaneous TID WC   insulin  glargine-yfgn  5 Units Subcutaneous BID   Continuous Infusions:  cefTRIAXone  (ROCEPHIN )  IV 1 g (09/25/23 0914)      LOS: 2 days   [name]  [facility]  09/26/2023, 7:29 AM

## 2023-09-27 DIAGNOSIS — E119 Type 2 diabetes mellitus without complications: Secondary | ICD-10-CM | POA: Diagnosis not present

## 2023-09-27 DIAGNOSIS — N1330 Unspecified hydronephrosis: Secondary | ICD-10-CM | POA: Diagnosis not present

## 2023-09-27 DIAGNOSIS — N12 Tubulo-interstitial nephritis, not specified as acute or chronic: Secondary | ICD-10-CM | POA: Diagnosis not present

## 2023-09-27 DIAGNOSIS — Z794 Long term (current) use of insulin: Secondary | ICD-10-CM | POA: Diagnosis not present

## 2023-09-27 LAB — GLUCOSE, CAPILLARY
Glucose-Capillary: 115 mg/dL — ABNORMAL HIGH (ref 70–99)
Glucose-Capillary: 130 mg/dL — ABNORMAL HIGH (ref 70–99)
Glucose-Capillary: 108 mg/dL — ABNORMAL HIGH (ref 70–99)
Glucose-Capillary: 181 mg/dL — ABNORMAL HIGH (ref 70–99)

## 2023-09-27 MED ORDER — FLUCONAZOLE 100 MG PO TABS
200.0000 mg | ORAL_TABLET | Freq: Every day | ORAL | Status: DC
  Administered 2023-09-27 – 2023-09-28 (×2): 200 mg via ORAL
  Filled 2023-09-27 (×2): qty 2

## 2023-09-27 MED ORDER — FENTANYL CITRATE PF 50 MCG/ML IJ SOSY
50.0000 ug | PREFILLED_SYRINGE | Freq: Four times a day (QID) | INTRAMUSCULAR | Status: DC | PRN
  Administered 2023-09-27: 50 ug via INTRAVENOUS
  Filled 2023-09-27: qty 1

## 2023-09-27 NOTE — Progress Notes (Signed)
 [facility] PROGRESS NOTE    Progress Note  [name]  FMW:[id number] DOB: Dec 25, 1975 DOA: 09/23/2023 PCP: Tina Dover, PA-C     Brief Narrative:   [name] is an 48 y.o. female past medical history of intellectual disability, essential hypertension hyperlipidemia diabetes mellitus type 2 comes in complaining of abdominal pain to have left hydronephrosis with pyonephritis CT scan of the abdomen pelvis also showed left ureteral calculi about 3 cm and several uterine fibroids mildly increased in size and is mostly.  Urology was consulted   Assessment/Plan:   Left-sided hydronephrosis due to 3 cm nephrolithiasis and possibly pyelonephritis: Urology was consulted recommended cystoscopy with left retrograde pyelogram and insertion of left ureteral stent. Blood cultures negative till date. Continue IV Rocephin .  Will need to complete a 7-day course, will transition to oral antibiotics2325 She has remained afebrile with no leukocytosis. Creatinine is remaining relatively stable. She relates her pain is not controlled continue narcotics Zofran  for nausea. She does not have gallbladder LFTs unremarkable  DM (diabetes mellitus) type 2, uncontrolled,  Insulin -requiring or dependent type II diabetes mellitus (HCC) Last A1c of 9.8. Continue long-acting insulin  per sliding scale.  Protein-calorie malnutrition, severe (HCC) Noted.  Intellectual disability Noted.  Hyperlipidemia Continue statins.  DVT prophylaxis: lovenox  Family Communication:none Status is: Inpatient Remains inpatient appropriate because: Left-sided hydronephrosis with pyelonephritis    Code Status:     Code Status Orders  (From admission, onward)           Start     Ordered   09/24/23 0758  Full code  Continuous       Question:  By:  Answer:  Consent: discussion documented in EHR   09/24/23 0757           Code Status History     Date Active Date Inactive Code Status  Order ID Comments User Context   10/22/2022 0341 10/24/2022 2016 Full Code [id number]  Charlton Evalene RAMAN, MD ED   12/17/2021 2349 12/18/2021 1511 Full Code [id number]  Charlton Evalene RAMAN, MD ED   02/09/2017 2227 02/11/2017 0053 Full Code [id number]  Opyd, Evalene RAMAN, MD ED         IV Access:   Peripheral IV   Procedures and diagnostic studies:   No results found.    Medical Consultants:   None.   Subjective:    [name] she relates her pain is improved.  Objective:    Vitals:   09/26/23 0803 09/26/23 1705 09/26/23 1957 09/27/23 0724  BP: (!) 161/81 138/69 126/70 132/61  Pulse: (!) 109 (!) 101 99 93  Resp: 18 18 15 18   Temp: 98.4 F (36.9 C) 98.2 F (36.8 C) 98.2 F (36.8 C) 99.2 F (37.3 C)  TempSrc: Oral Oral  Oral  SpO2: 100% 99% 100% 100%  Weight:      Height:       SpO2: 100 %   Intake/Output Summary (Last 24 hours) at 09/27/2023 0752 Last data filed at 09/26/2023 1955 Gross per 24 hour  Intake 312.23 ml  Output --  Net 312.23 ml   Filed Weights   09/24/23 0800 09/24/23 1141  Weight: 46 kg 46 kg    Exam: General exam: In no acute distress. Respiratory system: Good air movement and clear to auscultation. Cardiovascular system: S1 & S2 heard, RRR. No JVD. Gastrointestinal system: Abdomen is nondistended, soft and nontender.  Extremities: No pedal edema. Skin: No rashes, lesions or ulcers  Data Reviewed:    Labs: Basic Metabolic  Panel: Recent Labs  Lab 09/23/23 2024 09/24/23 1120 09/25/23 0655  NA 135  --  136  K 3.9  --  4.1  CL 102  --  105  CO2 20*  --  20*  GLUCOSE 72  --  175*  BUN 15  --  15  CREATININE 0.74 0.84 0.72  CALCIUM 8.9  --  8.1*   GFR Estimated Creatinine Clearance: 52.4 mL/min (by C-G formula based on SCr of 0.72 mg/dL). Liver Function Tests: Recent Labs  Lab 09/23/23 2024  AST 26  ALT 24  ALKPHOS 90  BILITOT 1.2  PROT 8.4*  ALBUMIN 3.8   Recent Labs  Lab 09/23/23 2024  LIPASE 25   No results for  input(s): AMMONIA in the last 168 hours. Coagulation profile No results for input(s): INR, PROTIME in the last 168 hours. COVID-19 Labs  No results for input(s): DDIMER, FERRITIN, LDH, CRP in the last 72 hours.  Lab Results  Component Value Date   SARSCOV2NAA Not Detected 03/11/2019   SARSCOV2NAA Not Detected 09/10/2018    CBC: Recent Labs  Lab 09/23/23 2024 09/24/23 1120  WBC 7.4 10.0  HGB 14.0 12.7  HCT 42.9 40.2  MCV 85.8 87.4  PLT 349 278   Cardiac Enzymes: No results for input(s): CKTOTAL, CKMB, CKMBINDEX, TROPONINI in the last 168 hours. BNP (last 3 results) No results for input(s): PROBNP in the last 8760 hours. CBG: Recent Labs  Lab 09/26/23 0806 09/26/23 1142 09/26/23 1706 09/26/23 2130 09/27/23 0725  GLUCAP 149* 104* 121* 104* 115*   D-Dimer: No results for input(s): DDIMER in the last 72 hours. Hgb A1c: Recent Labs    09/25/23 0655  HGBA1C 7.8*   Lipid Profile: No results for input(s): CHOL, HDL, LDLCALC, TRIG, CHOLHDL, LDLDIRECT in the last 72 hours. Thyroid function studies: No results for input(s): TSH, T4TOTAL, T3FREE, THYROIDAB in the last 72 hours.  Invalid input(s): FREET3 Anemia work up: No results for input(s): VITAMINB12, FOLATE, FERRITIN, TIBC, IRON, RETICCTPCT in the last 72 hours. Sepsis Labs: Recent Labs  Lab 09/23/23 2024 09/24/23 0605 09/24/23 1120  WBC 7.4  --  10.0  LATICACIDVEN  --  2.2*  --    Microbiology Recent Results (from the past 240 hours)  Culture, blood (Routine X 2) w Reflex to ID Panel     Status: None (Preliminary result)   Collection Time: 09/24/23  5:43 AM   Specimen: BLOOD  Result Value Ref Range Status   Specimen Description BLOOD RIGHT ANTECUBITAL  Final   Special Requests   Final    BOTTLES DRAWN AEROBIC AND ANAEROBIC Blood Culture adequate volume   Culture   Final    NO GROWTH 2 DAYS Performed at [facility], [address], [place], [place] [address]    Report Status PENDING  Incomplete  Culture, blood (Routine X 2) w Reflex to ID Panel     Status: None (Preliminary result)   Collection Time: 09/24/23  5:46 AM   Specimen: BLOOD LEFT HAND  Result Value Ref Range Status   Specimen Description BLOOD LEFT HAND  Final   Special Requests   Final    BOTTLES DRAWN AEROBIC ONLY Blood Culture results may not be optimal due to an inadequate volume of blood received in culture bottles   Culture   Final    NO GROWTH 2 DAYS Performed at [facility], [address] [place], [place] [address]    Report Status PENDING  Incomplete  Medications:    enoxaparin  (LOVENOX ) injection  40 mg Subcutaneous Q24H   insulin  aspart  0-15 Units Subcutaneous TID WC   insulin  aspart  0-5 Units Subcutaneous QHS   insulin  aspart  2 Units Subcutaneous TID WC   insulin  glargine-yfgn  5 Units Subcutaneous BID   pantoprazole   40 mg Oral BID   Continuous Infusions:  cefTRIAXone  (ROCEPHIN )  IV Stopped (09/26/23 0916)   fluconazole  (DIFLUCAN ) IV 200 mg (09/26/23 1443)      LOS: 3 days   Erle Odell Castor  [facility]  09/27/2023, 7:52 AM

## 2023-09-27 NOTE — Plan of Care (Signed)
  Problem: Clinical Measurements: Goal: Ability to maintain clinical measurements within normal limits will improve Outcome: Progressing Goal: Will remain free from infection Outcome: Progressing Goal: Diagnostic test results will improve Outcome: Progressing Goal: Respiratory complications will improve Outcome: Progressing Goal: Cardiovascular complication will be avoided Outcome: Progressing   Problem: Elimination: Goal: Will not experience complications related to bowel motility Outcome: Progressing Goal: Will not experience complications related to urinary retention Outcome: Progressing   Problem: Safety: Goal: Ability to remain free from injury will improve Outcome: Progressing

## 2023-09-28 ENCOUNTER — Other Ambulatory Visit (HOSPITAL_COMMUNITY): Payer: Self-pay

## 2023-09-28 DIAGNOSIS — Z794 Long term (current) use of insulin: Secondary | ICD-10-CM | POA: Diagnosis not present

## 2023-09-28 DIAGNOSIS — E119 Type 2 diabetes mellitus without complications: Secondary | ICD-10-CM | POA: Diagnosis not present

## 2023-09-28 DIAGNOSIS — N12 Tubulo-interstitial nephritis, not specified as acute or chronic: Secondary | ICD-10-CM | POA: Diagnosis not present

## 2023-09-28 DIAGNOSIS — N1330 Unspecified hydronephrosis: Secondary | ICD-10-CM | POA: Diagnosis not present

## 2023-09-28 LAB — GLUCOSE, CAPILLARY: Glucose-Capillary: 172 mg/dL — ABNORMAL HIGH (ref 70–99)

## 2023-09-28 MED ORDER — SULFAMETHOXAZOLE-TRIMETHOPRIM 800-160 MG PO TABS
1.0000 | ORAL_TABLET | Freq: Two times a day (BID) | ORAL | 0 refills | Status: AC
  Filled 2023-09-28: qty 14, 7d supply, fill #0

## 2023-09-28 MED ORDER — OXYCODONE HCL 10 MG PO TABS
10.0000 mg | ORAL_TABLET | ORAL | 0 refills | Status: AC | PRN
  Filled 2023-09-28: qty 30, 5d supply, fill #0

## 2023-09-28 MED ORDER — METFORMIN HCL 500 MG PO TABS
500.0000 mg | ORAL_TABLET | Freq: Every day | ORAL | 2 refills | Status: DC
  Filled 2023-09-28: qty 30, 30d supply, fill #0

## 2023-09-28 MED ORDER — SULFAMETHOXAZOLE-TRIMETHOPRIM 800-160 MG PO TABS
1.0000 | ORAL_TABLET | Freq: Two times a day (BID) | ORAL | Status: DC
  Administered 2023-09-28: 1 via ORAL
  Filled 2023-09-28: qty 1

## 2023-09-28 MED ORDER — ONDANSETRON 4 MG PO TBDP
4.0000 mg | ORAL_TABLET | Freq: Three times a day (TID) | ORAL | 0 refills | Status: AC | PRN
  Filled 2023-09-28: qty 20, 7d supply, fill #0

## 2023-09-28 MED ORDER — INSULIN GLARGINE 100 UNIT/ML SOLOSTAR PEN
15.0000 [IU] | PEN_INJECTOR | SUBCUTANEOUS | Status: DC
Start: 2023-09-28 — End: 2023-12-23

## 2023-09-28 NOTE — Plan of Care (Signed)

## 2023-09-28 NOTE — TOC Transition Note (Signed)
 Transition of Care [facility]) - Discharge Note   Patient Details  Name: [name] MRN: [id number] Date of Birth: [birth year]  [facility] CM/SW Contact:  Tom-Johnson, Nakeita Styles Daphne, RN Phone Number: 09/28/2023, 9:14 AM   Clinical Narrative:     Patient is scheduled for discharge today.  Readmission Risk Assessment done. Outpatient f/u, hospital f/u and discharge instructions on AVS. Prescriptions sent to [facility] pharmacy and patient will receive meds prior discharge. Patient requested a cab voucher, will be given to patient at the discharge lounge to transport at discharge.  No further TOC needs noted.     Final next level of care: Home/Self Care Barriers to Discharge: Barriers Resolved   Patient Goals and CMS Choice Patient states their goals for this hospitalization and ongoing recovery are:: To return home CMS Medicare.gov Compare Post Acute Care list provided to:: Patient Choice offered to / list presented to : Patient      Discharge Placement                Patient to be transferred to facility by: [facility]      Discharge Plan and Services Additional resources added to the After Visit Summary for                  DME Arranged: N/A DME Agency: NA       HH Arranged: NA HH Agency: NA        Social Drivers of Health (SDOH) Interventions SDOH Screenings   Food Insecurity: No Food Insecurity (09/24/2023)  Housing: Low Risk  (09/24/2023)  Transportation Needs: No Transportation Needs (09/24/2023)  Utilities: Not At Risk (09/24/2023)  Depression (PHQ2-9): Low Risk  (09/30/2018)  Tobacco Use: Low Risk  (09/24/2023)     Readmission Risk Interventions    09/25/2023    2:09 PM  Readmission Risk Prevention Plan  Post Dischage Appt Complete  Medication Screening Complete  Transportation Screening Complete

## 2023-09-28 NOTE — Discharge Summary (Addendum)
 Physician Discharge Summary  [name] FMW:[id number] DOB: 06/27/1975 DOA: 09/23/2023  PCP: Neita Dover, PA-C  Admit date: 09/23/2023 Discharge date: 09/28/2023  Admitted From: Home Disposition:  Home  Recommendations for Outpatient Follow-up:  Follow up with Urology in 1-2 weeks Please obtain BMP/CBC in one week   Home Health:No Equipment/Devices:None  Discharge Condition:Stable CODE STATUS:Full Diet recommendation: Heart Healthy   Brief/Interim Summary: 48 y.o. female past medical history of intellectual disability, essential hypertension hyperlipidemia diabetes mellitus type 2 comes in complaining of abdominal pain to have left hydronephrosis with pyonephritis CT scan of the abdomen pelvis also showed left ureteral calculi about 3 cm and several uterine fibroids mildly increased in size and is mostly.  Urology was consulted   Discharge Diagnoses:  Principal Problem:   Pyelonephritis Active Problems:   Protein-calorie malnutrition, severe (HCC)   Intellectual disability   Insulin -requiring or dependent type II diabetes mellitus (HCC)   Hyperlipidemia   Hydronephrosis of left kidney  Left-sided hydronephrosis due to 3 cm nephrolithiasis possible pyelonephritis She was started empirically on antibiotics. Blood cultures and urine cultures were drawn. Urology was consulted who performed cystoscopy and left retrograde pyelogram with stent placement on 09/25/2023. Blood cultures remain negative. She will continue oral Bactrim  as an outpatient. Follow-up urology as an outpatient.  Diabetes mellitus type 2 uncontrolled: Last A1c of 7.8. She will continue her long-acting insulin  plus metformin .  Severe protein caloric malnutrition: Noted.  Intellectual disability/congenital disorder/ DiGeorge syndrome: Noted.  Hyperlipidemia: Continue statins.   Protein caloric malnutrition: Noted.  Discharge Instructions  Discharge Instructions     Diet - low sodium  heart healthy   Complete by: As directed    Increase activity slowly   Complete by: As directed    No wound care   Complete by: As directed       Allergies as of 09/28/2023   No Known Allergies      Medication List     TAKE these medications    Accu-Chek Guide test strip Generic drug: glucose blood Use 3 (three) times daily as instructed   Accu-Chek Softclix Lancets lancets Use 3 (three) times daily as directed   Aspirin  Low Dose 81 MG tablet Generic drug: aspirin  EC Take 1 tablet (81 mg total) by mouth daily. What changed: when to take this   atorvastatin 40 MG tablet Commonly known as: LIPITOR Take 40 mg by mouth at bedtime.   Dulaglutide 1.5 MG/0.5ML Soaj Inject 1.5 mg into the skin every Tuesday.   ferrous sulfate  300 (60 Fe) MG/5ML syrup Take 5 mLs (300 mg total) by mouth daily.   insulin  glargine 100 UNIT/ML Solostar Pen Commonly known as: LANTUS  Inject 15 Units into the skin See admin instructions. Inject 60 units into the skin in the morning and 16 units at bedtime What changed:  how much to take when to take this additional instructions   metFORMIN  500 MG tablet Commonly known as: GLUCOPHAGE  Take 1 tablet (500 mg total) by mouth daily with breakfast.   omeprazole  20 MG capsule Commonly known as: PRILOSEC Take 1 capsule (20 mg total) by mouth daily. What changed: when to take this   Oxycodone  HCl 10 MG Tabs Take 1 tablet (10 mg total) by mouth every 4 (four) hours as needed for up to 5 days for moderate pain (pain score 4-6).   sulfamethoxazole -trimethoprim  800-160 MG tablet Commonly known as: BACTRIM  DS Take 1 tablet by mouth every 12 (twelve) hours for 7 days.   Vitamin D (Ergocalciferol) 1.25 MG (  50000 UNIT) Caps capsule Commonly known as: DRISDOL Take 50,000 Units by mouth once a week.        Follow-up Information     Stoneking, Adine PARAS., MD Follow up in 2 week(s).   Specialty: Urology Contact information: [address] [place] [address] [phone]                No Known Allergies  Consultations: Urology   Procedures/Studies: DG C-Arm 1-60 Min Result Date: 09/24/2023 CLINICAL DATA:  Cystoscopy and stent placement EXAM: DG C-ARM 1-60 MIN COMPARISON:  09/24/2023 FINDINGS: Six fluoroscopic images are obtained during the performance of the procedure and are provided for interpretation only. The left ureter is cannulated and opacified, with placement of a left ureteral stent extending from the left renal pelvis the bladder lumen. Please refer to the operative report. Fluoroscopy time: 52 seconds, 7.24 mGy IMPRESSION: 1. Retrograde left pyelogram and left ureteral stent placement as above. Please refer to the operative report. Electronically Signed   By: Ozell Daring M.D.   On: 09/24/2023 17:07   CT ABDOMEN PELVIS W CONTRAST Result Date: 09/24/2023 CLINICAL DATA:  48 year old female with abdominal pain and nausea for 2 weeks. EXAM: CT ABDOMEN AND PELVIS WITH CONTRAST TECHNIQUE: Multidetector CT imaging of the abdomen and pelvis was performed using the standard protocol following bolus administration of intravenous contrast. RADIATION DOSE REDUCTION: This exam was performed according to the departmental dose-optimization program which includes automated exposure control, adjustment of the mA and/or kV according to patient size and/or use of iterative reconstruction technique. CONTRAST:  75mL OMNIPAQUE  IOHEXOL  350 MG/ML SOLN COMPARISON:  CT Abdomen and Pelvis 07/24/2022. FINDINGS: Lower chest: Stable and normal. Hepatobiliary: Chronic cholecystectomy. Liver is within normal limits. Pancreas: Negative. Spleen: Negative. Adrenals/Urinary Tract: Normal adrenal glands. Fairly symmetric initial renal contrast enhancement. But asymmetric left hydronephrosis, left hydroureter, and delayed left renal contrast excretion compared to the right. Mild left urothelial thickening and enhancement in addition to  hydroureter (series 3, image 37). And the left ureter seems to taper abruptly on series 8, image 28 where a punctate calcification can be identified, adjacent to the left gonadal vessels. Numerous pelvic phleboliths. No distal hydroureter identified. Moderately distended urinary bladder with mild bladder wall thickening, similar to last year. No evidence of pyelonephritis. Stomach/Bowel: Mostly decompressed large and small bowel loops throughout the abdomen and pelvis. Retained stool in the rectum. Decreased volume of visceral fat when compared to last year. And difficult to exclude generalized indistinct appearance of the bowel now. Decompressed distal stomach and duodenum. No pneumoperitoneum or free fluid. Appendix not identified. Vascular/Lymphatic: Mild Calcified aortic atherosclerosis. Major vascular structures in the abdomen and pelvis are enhancing and appear to be patent. SMA calcified atherosclerosis also visible. Normal caliber abdominal aorta. No lymphadenopathy. Reproductive: Chronic severe lobulated and enlarged multi fibroid uterus with mildly increased size, and moderately increased heterogeneity since last year. The dominant solid fundal fibroid is now 57 mm diameter, 54 mm last year. Adjacent oval fibroids up to 4 cm now appear low-density and degenerated (series 3, image 59). No surrounding inflammation. Adnexa within normal limits. Other: No pelvis free fluid. Musculoskeletal: Negative. IMPRESSION: 1. Left Hydronephrosis and proximal left Hydroureter. Delayed contrast excretion suggesting obstructive uropathy with a possible punctate obstructing left ureteral calculus located about 3 cm distal to the left ureterovesical junction. But there is also left urothelial, bladder wall mild thickening and enhancement. Differential diagnosis includes ascending urinary tract infection with pyonephrosis. No evidence of  pyelonephritis. 2. Decreased volume of visceral fat since last year. Indistinct appearance  of the bowel, but no discrete bowel inflammation. No evidence of bowel obstruction. 3. Severe Fibroid Uterus with increased size of dominant solid fundal fibroid since last year (now up to 57 mm), with smaller surrounding fibroids having centrally degenerated since last year. 4.  Aortic Atherosclerosis (ICD10-I70.0). Electronically Signed   By: VEAR Hurst M.D.   On: 09/24/2023 04:45    Subjective: No complaints  Discharge Exam: Vitals:   09/27/23 1744 09/27/23 2109  BP: 111/68 105/72  Pulse: 95 92  Resp: 18 18  Temp: 98.1 F (36.7 C) 98 F (36.7 C)  SpO2: 100% 100%   Vitals:   09/26/23 1957 09/27/23 0724 09/27/23 1744 09/27/23 2109  BP: 126/70 132/61 111/68 105/72  Pulse: 99 93 95 92  Resp: 15 18 18 18   Temp: 98.2 F (36.8 C) 99.2 F (37.3 C) 98.1 F (36.7 C) 98 F (36.7 C)  TempSrc:  Oral Oral Oral  SpO2: 100% 100% 100% 100%  Weight:      Height:        General: Pt is alert, awake, not in acute distress Cardiovascular: RRR, S1/S2 +, no rubs, no gallops Respiratory: CTA bilaterally, no wheezing, no rhonchi Abdominal: Soft, NT, ND, bowel sounds + Extremities: no edema, no cyanosis    The results of significant diagnostics from this hospitalization (including imaging, microbiology, ancillary and laboratory) are listed below for reference.     Microbiology: Recent Results (from the past 240 hours)  Culture, blood (Routine X 2) w Reflex to ID Panel     Status: None (Preliminary result)   Collection Time: 09/24/23  5:43 AM   Specimen: BLOOD  Result Value Ref Range Status   Specimen Description BLOOD RIGHT ANTECUBITAL  Final   Special Requests   Final    BOTTLES DRAWN AEROBIC AND ANAEROBIC Blood Culture adequate volume   Culture   Final    NO GROWTH 3 DAYS Performed at [facility], [address], [place], [place] [address]    Report Status PENDING  Incomplete  Culture, blood (Routine X 2) w Reflex to ID Panel     Status: None (Preliminary result)   Collection  Time: 09/24/23  5:46 AM   Specimen: BLOOD LEFT HAND  Result Value Ref Range Status   Specimen Description BLOOD LEFT HAND  Final   Special Requests   Final    BOTTLES DRAWN AEROBIC ONLY Blood Culture results may not be optimal due to an inadequate volume of blood received in culture bottles   Culture   Final    NO GROWTH 3 DAYS Performed at [facility], [address] [place], [place] [address]    Report Status PENDING  Incomplete     Labs: BNP (last 3 results) No results for input(s): BNP in the last 8760 hours. Basic Metabolic Panel: Recent Labs  Lab 09/23/23 2024 09/24/23 1120 09/25/23 0655  NA 135  --  136  K 3.9  --  4.1  CL 102  --  105  CO2 20*  --  20*  GLUCOSE 72  --  175*  BUN 15  --  15  CREATININE 0.74 0.84 0.72  CALCIUM 8.9  --  8.1*   Liver Function Tests: Recent Labs  Lab 09/23/23 2024  AST 26  ALT 24  ALKPHOS 90  BILITOT 1.2  PROT 8.4*  ALBUMIN 3.8   Recent Labs  Lab 09/23/23 2024  LIPASE 25   No results for input(s): AMMONIA in the last 168 hours. CBC: Recent Labs  Lab 09/23/23 2024 09/24/23 1120  WBC 7.4 10.0  HGB 14.0 12.7  HCT 42.9 40.2  MCV 85.8 87.4  PLT 349 278   Cardiac Enzymes: No results for input(s): CKTOTAL, CKMB, CKMBINDEX, TROPONINI in the last 168 hours. BNP: Invalid input(s): POCBNP CBG: Recent Labs  Lab 09/26/23 2130 09/27/23 0725 09/27/23 1148 09/27/23 1745 09/27/23 2108  GLUCAP 104* 115* 130* 108* 181*   D-Dimer No results for input(s): DDIMER in the last 72 hours. Hgb A1c No results for input(s): HGBA1C in the last 72 hours. Lipid Profile No results for input(s): CHOL, HDL, LDLCALC, TRIG, CHOLHDL, LDLDIRECT in the last 72 hours. Thyroid function studies No results for input(s): TSH, T4TOTAL, T3FREE, THYROIDAB in the last 72 hours.  Invalid input(s): FREET3 Anemia work up No results for input(s): VITAMINB12, FOLATE, FERRITIN, TIBC, IRON,  RETICCTPCT in the last 72 hours. Urinalysis    Component Value Date/Time   COLORURINE AMBER (A) 09/24/2023 0230   APPEARANCEUR CLOUDY (A) 09/24/2023 0230   LABSPEC 1.016 09/24/2023 0230   PHURINE 6.0 09/24/2023 0230   GLUCOSEU NEGATIVE 09/24/2023 0230   HGBUR SMALL (A) 09/24/2023 0230   BILIRUBINUR NEGATIVE 09/24/2023 0230   KETONESUR 20 (A) 09/24/2023 0230   PROTEINUR 100 (A) 09/24/2023 0230   UROBILINOGEN 0.2 01/15/2007 2203   NITRITE NEGATIVE 09/24/2023 0230   LEUKOCYTESUR MODERATE (A) 09/24/2023 0230   Sepsis Labs Recent Labs  Lab 09/23/23 2024 09/24/23 1120  WBC 7.4 10.0   Microbiology Recent Results (from the past 240 hours)  Culture, blood (Routine X 2) w Reflex to ID Panel     Status: None (Preliminary result)   Collection Time: 09/24/23  5:43 AM   Specimen: BLOOD  Result Value Ref Range Status   Specimen Description BLOOD RIGHT ANTECUBITAL  Final   Special Requests   Final    BOTTLES DRAWN AEROBIC AND ANAEROBIC Blood Culture adequate volume   Culture   Final    NO GROWTH 3 DAYS Performed at [facility], [address], [place], [place] [address]    Report Status PENDING  Incomplete  Culture, blood (Routine X 2) w Reflex to ID Panel     Status: None (Preliminary result)   Collection Time: 09/24/23  5:46 AM   Specimen: BLOOD LEFT HAND  Result Value Ref Range Status   Specimen Description BLOOD LEFT HAND  Final   Special Requests   Final    BOTTLES DRAWN AEROBIC ONLY Blood Culture results may not be optimal due to an inadequate volume of blood received in culture bottles   Culture   Final    NO GROWTH 3 DAYS Performed at [facility], [address] [place], [place] [address]    Report Status PENDING  Incomplete     Time coordinating discharge: Over 35 minutes  SIGNED:   Erle Odell Castor, MD  [facility] 09/28/2023, 7:46 AM Pager   If 7PM-7AM, please contact night-coverage [email or web address] Password [email or web address]

## 2023-09-28 NOTE — Plan of Care (Signed)
   Problem: Activity: Goal: Risk for activity intolerance will decrease Outcome: Progressing

## 2023-09-29 LAB — CULTURE, BLOOD (ROUTINE X 2)
Culture: NO GROWTH
Culture: NO GROWTH
Special Requests: ADEQUATE

## 2023-10-01 ENCOUNTER — Other Ambulatory Visit: Payer: Self-pay

## 2023-10-01 ENCOUNTER — Emergency Department (HOSPITAL_COMMUNITY)
Admission: EM | Admit: 2023-10-01 | Discharge: 2023-10-02 | Disposition: A | Discharge status: Home / Self Care | Attending: Emergency Medicine | Admitting: Emergency Medicine

## 2023-10-01 ENCOUNTER — Encounter (HOSPITAL_COMMUNITY): Payer: Self-pay

## 2023-10-01 DIAGNOSIS — E119 Type 2 diabetes mellitus without complications: Secondary | ICD-10-CM | POA: Insufficient documentation

## 2023-10-01 DIAGNOSIS — I10 Essential (primary) hypertension: Secondary | ICD-10-CM | POA: Insufficient documentation

## 2023-10-01 DIAGNOSIS — R1030 Lower abdominal pain, unspecified: Secondary | ICD-10-CM | POA: Diagnosis present

## 2023-10-01 DIAGNOSIS — R109 Unspecified abdominal pain: Secondary | ICD-10-CM

## 2023-10-01 LAB — CBC
HCT: 38.4 % (ref 36.0–46.0)
Hemoglobin: 12.6 g/dL (ref 12.0–15.0)
MCH: 27.5 pg (ref 26.0–34.0)
MCHC: 32.8 g/dL (ref 30.0–36.0)
MCV: 83.7 fL (ref 80.0–100.0)
Platelets: 310 10*3/uL (ref 150–400)
RBC: 4.59 MIL/uL (ref 3.87–5.11)
RDW: 12.6 % (ref 11.5–15.5)
WBC: 5.3 10*3/uL (ref 4.0–10.5)
nRBC: 0 % (ref 0.0–0.2)

## 2023-10-01 LAB — URINALYSIS, ROUTINE W REFLEX MICROSCOPIC
Bacteria, UA: NONE SEEN
Bilirubin Urine: NEGATIVE
Glucose, UA: >=500 mg/dL — AB
Ketones, ur: 20 mg/dL — AB
Nitrite: NEGATIVE
Protein, ur: >=300 mg/dL — AB
RBC / HPF: >50 RBC/hpf (ref 0–5)
Specific Gravity, Urine: 1.017 (ref 1.005–1.030)
WBC, UA: >50 WBC/hpf (ref 0–5)
pH: 7 (ref 5.0–8.0)

## 2023-10-01 LAB — COMPREHENSIVE METABOLIC PANEL WITH GFR
ALT: 16 U/L (ref 0–44)
AST: 17 U/L (ref 15–41)
Albumin: 3.3 g/dL — ABNORMAL LOW (ref 3.5–5.0)
Alkaline Phosphatase: 73 U/L (ref 38–126)
Anion gap: 10 (ref 5–15)
BUN: 19 mg/dL (ref 6–20)
CO2: 27 mmol/L (ref 22–32)
Calcium: 9.3 mg/dL (ref 8.9–10.3)
Chloride: 99 mmol/L (ref 98–111)
Creatinine, Ser: 0.94 mg/dL (ref 0.44–1.00)
GFR, Estimated: >60 mL/min (ref >60)
Glucose, Bld: 271 mg/dL — ABNORMAL HIGH (ref 70–99)
Potassium: 3.9 mmol/L (ref 3.5–5.1)
Sodium: 136 mmol/L (ref 135–145)
Total Bilirubin: 0.6 mg/dL (ref 0.0–1.2)
Total Protein: 7.5 g/dL (ref 6.5–8.1)

## 2023-10-01 LAB — LIPASE, BLOOD: Lipase: 50 U/L (ref 11–51)

## 2023-10-01 LAB — HCG, SERUM, QUALITATIVE: Preg, Serum: NEGATIVE

## 2023-10-01 NOTE — ED Triage Notes (Signed)
 Pt here for middle abd pain that started a month ago. Denies n/v/d

## 2023-10-02 ENCOUNTER — Emergency Department (HOSPITAL_COMMUNITY)

## 2023-10-02 MED ORDER — MORPHINE SULFATE (PF) 4 MG/ML IV SOLN
4.0000 mg | Freq: Once | INTRAVENOUS | Status: AC
  Administered 2023-10-02: 4 mg via INTRAVENOUS
  Filled 2023-10-02: qty 1

## 2023-10-02 MED ORDER — HALOPERIDOL LACTATE 5 MG/ML IJ SOLN
2.0000 mg | Freq: Once | INTRAMUSCULAR | Status: AC
  Administered 2023-10-02: 2 mg via INTRAVENOUS
  Filled 2023-10-02: qty 1

## 2023-10-02 MED ORDER — SODIUM CHLORIDE 0.9 % IV SOLN
1.0000 g | Freq: Once | INTRAVENOUS | Status: AC
  Administered 2023-10-02: 1 g via INTRAVENOUS
  Filled 2023-10-02: qty 10

## 2023-10-02 MED ORDER — SODIUM CHLORIDE 0.9 % IV BOLUS
1000.0000 mL | Freq: Once | INTRAVENOUS | Status: AC
  Administered 2023-10-02: 1000 mL via INTRAVENOUS

## 2023-10-02 MED ORDER — CEPHALEXIN 500 MG PO CAPS: 500.0000 mg | ORAL_CAPSULE | Freq: Three times a day (TID) | ORAL | 0 refills | Status: AC

## 2023-10-02 MED ORDER — ONDANSETRON HCL 4 MG/2ML IJ SOLN
4.0000 mg | Freq: Once | INTRAMUSCULAR | Status: AC
  Administered 2023-10-02: 4 mg via INTRAVENOUS
  Filled 2023-10-02: qty 2

## 2023-10-02 MED ORDER — MORPHINE SULFATE (PF) 4 MG/ML IV SOLN
4.0000 mg | Freq: Once | INTRAVENOUS | Status: DC
  Filled 2023-10-02: qty 1

## 2023-10-02 NOTE — Discharge Instructions (Signed)
 You were evaluated in the Emergency Department and after careful evaluation, we did not find any emergent condition requiring admission or further testing in the hospital.  Your exam/testing today is overall reassuring.  Symptoms may be due to a urinary tract infection.  Take the Keflex  antibiotic as directed, follow-up with your regular doctors and urology.  Please return to the Emergency Department if you experience any worsening of your condition.   Thank you for allowing us  to be a part of your care.

## 2023-10-02 NOTE — ED Provider Notes (Signed)
 [facility]-EMER[facility]ENCY DEPT [facility] Emergency Department Provider Note MRN:  [id number]  Arrival date & time: 10/02/23     Chief Complaint   Abdominal Pain   History of Present Illness   [name] is a 48 y.o. year-old female with a history of diabetes, microcephaly, DiGeorge syndrome presenting to the ED with chief complaint of abdominal pain.  Lower abdominal pain for the past month, not going away, getting worse.  Recently diagnosed with a kidney stone.  Nausea but no vomiting, no diarrhea constipation, no fever, no dysuria.  Review of Systems  A thorough review of systems was obtained and all systems are negative except as noted in the HPI and PMH.   Patient's Health History    Past Medical History:  Diagnosis Date   Diabetes mellitus without complication (HCC)    DiGeorge syndrome (HCC)    Ectrodactyly    Genetic defect    genetic mutation of unknown significance per [facility] genetic notes   GERD (gastroesophageal reflux disease)    Hypercholesterolemia    Hypertension    Intellectual disability    Microcephaly (HCC)    Pelvic mass    Ventral hernia     Past Surgical History:  Procedure Laterality Date   CHOLECYSTECTOMY     CYSTOSCOPY W/ URETERAL STENT PLACEMENT Left 09/24/2023   Procedure: CYSTOSCOPY, WITH RETROGRADE PYELOGRAM AND URETERAL STENT INSERTION;  Surgeon: Roseann Adine PARAS., MD;  Location: [facility] OR;  Service: Urology;  Laterality: Left;   HAND SURGERY Right    HERNIA REPAIR      Family History  Problem Relation Age of Onset   Diabetes Mother    GER disease Mother    Diabetes Father     Social History   Socioeconomic History   Marital status: Single    Spouse name: Not on file   Number of children: Not on file   Years of education: Not on file   Highest education level: Not on file  Occupational History   Not on file  Tobacco Use   Smoking status: Never   Smokeless tobacco: Never  Vaping Use   Vaping status: Never Used  Substance and  Sexual Activity   Alcohol use: No   Drug use: No   Sexual activity: Not on file  Other Topics Concern   Not on file  Social History Narrative   Not on file   Social Drivers of Health   Financial Resource Strain: Not on file  Food Insecurity: No Food Insecurity (09/24/2023)   Hunger Vital Sign    Worried About Running Out of Food in the Last Year: Never true    Ran Out of Food in the Last Year: Never true  Transportation Needs: No Transportation Needs (09/24/2023)   PRAPARE - Administrator, Civil Service (Medical): No    Lack of Transportation (Non-Medical): No  Physical Activity: Not on file  Stress: Not on file  Social Connections: Not on file  Intimate Partner Violence: Not At Risk (09/24/2023)   Humiliation, Afraid, Rape, and Kick questionnaire    Fear of Current or Ex-Partner: No    Emotionally Abused: No    Physically Abused: No    Sexually Abused: No     Physical Exam   Vitals:   10/02/23 0202 10/02/23 0536  BP: 126/74 126/82  Pulse: 100 95  Resp: 16 16  Temp: 98.2 F (36.8 C) 98.4 F (36.9 C)  SpO2: 100% 100%    CONSTITUTIONAL: Well-appearing, NAD NEURO/PSYCH:  Alert  and oriented x 3, no focal deficits EYES:  eyes equal and reactive ENT/NECK:  no LAD, no JVD CARDIO: Regular rate, well-perfused, normal S1 and S2 PULM:  CTAB no wheezing or rhonchi GI/GU:  non-distended, non-tender MSK/SPINE:  No gross deformities, no edema SKIN:  no rash, atraumatic   *Additional and/or pertinent findings included in MDM below  Diagnostic and Interventional Summary    EKG Interpretation Date/Time:    Ventricular Rate:    PR Interval:    QRS Duration:    QT Interval:    QTC Calculation:   R Axis:      Text Interpretation:         Labs Reviewed  COMPREHENSIVE METABOLIC PANEL WITH GFR - Abnormal; Notable for the following components:      Result Value   Glucose, Bld 271 (*)    Albumin 3.3 (*)    All other components within normal limits   URINALYSIS, ROUTINE W REFLEX MICROSCOPIC - Abnormal; Notable for the following components:   Color, Urine AMBER (*)    APPearance CLOUDY (*)    Glucose, UA >=500 (*)    Hgb urine dipstick LARGE (*)    Ketones, ur 20 (*)    Protein, ur >=300 (*)    Leukocytes,Ua LARGE (*)    All other components within normal limits  LIPASE, BLOOD  CBC  HCG, SERUM, QUALITATIVE    CT Renal Stone Study  Final Result      Medications  morphine  (PF) 4 MG/ML injection 4 mg (4 mg Intravenous Given 10/02/23 0157)  ondansetron  (ZOFRAN ) injection 4 mg (4 mg Intravenous Given 10/02/23 0201)  sodium chloride  0.9 % bolus 1,000 mL (0 mLs Intravenous Stopped 10/02/23 0518)  cefTRIAXone  (ROCEPHIN ) 1 g in sodium chloride  0.9 % 100 mL IVPB (0 g Intravenous Stopped 10/02/23 0517)  haloperidol lactate (HALDOL) injection 2 mg (2 mg Intravenous Given 10/02/23 0428)     Procedures  /  Critical Care Procedures  ED Course and Medical Decision Making  Initial Impression and Ddx Suspect retained kidney stone causing continued pain.  Providing pain control and will monitor closely, evaluate for AKI, concomitant UTI, CT to exclude more acute process.  Past medical/surgical history that increases complexity of ED encounter: DiGeorge  Interpretation of Diagnostics I personally reviewed the Laboratory Testing and my interpretation is as follows: No significant blood count or electrolyte disturbance.  CT unremarkable  Patient Reassessment and Ultimate Disposition/Management     Urinalysis possibly with evidence to suggest infection.  Appropriate for discharge as patient is feeling much better and otherwise workup is reassuring.  Patient management required discussion with the following services or consulting groups:  None  Complexity of Problems Addressed Acute illness or injury that poses threat of life of bodily function  Additional Data Reviewed and Analyzed Further history obtained from: Prior labs/imaging  results  Additional Factors Impacting ED Encounter Risk Prescriptions and Consideration of hospitalization  [name], MD [facility] Emergency Medicine [facility] [email or web address]  Final Clinical Impressions(s) / ED Diagnoses     ICD-10-CM   1. Abdominal pain, unspecified abdominal location  R10.9       ED Discharge Orders          Ordered    cephALEXin  (KEFLEX ) 500 MG capsule  3 times daily        10/02/23 0514             Discharge Instructions Discussed with and Provided to Patient:  Discharge Instructions      You were evaluated in the Emergency Department and after careful evaluation, we did not find any emergent condition requiring admission or further testing in the hospital.  Your exam/testing today is overall reassuring.  Symptoms may be due to a urinary tract infection.  Take the Keflex  antibiotic as directed, follow-up with your regular doctors and urology.  Please return to the Emergency Department if you experience any worsening of your condition.   Thank you for allowing us  to be a part of your care.       Theadore Ozell HERO, MD 10/02/23 [phone]

## 2023-10-06 NOTE — Congregational Nurse Program (Signed)
  Dept: [phone]   Congregational Nurse Program Note  Date of Encounter: 09/29/2023  Resident hospitalized 6/18 wit nausea and abdominal pain, diagnosed with UTI and kidney stone.  Discharged with antibiotic, pain and nausea medication.  Reviewed all medicines with resident who voiced understanding of how and when to take each medicine.  Consulted with [employer] and case Production designer, theatre/television/film to ensure resident had sufficient food and water to last through weekend while taking the antibiotic.  Advised resident to go to ER if symptoms return or worsen.  Assisted resident with Trulicity injection left lower quadrant of abdomen. Past Medical History: Past Medical History:  Diagnosis Date   Diabetes mellitus without complication (HCC)    DiGeorge syndrome (HCC)    Ectrodactyly    Genetic defect    genetic mutation of unknown significance per [facility] genetic notes   GERD (gastroesophageal reflux disease)    Hypercholesterolemia    Hypertension    Intellectual disability    Microcephaly (HCC)    Pelvic mass    Ventral hernia     Encounter Details:  Community Questionnaire - 09/29/23 1555       Questionnaire   Ask client: Do you give verbal consent for me to treat you today? Yes    Student Assistance N/A    [place]    Encounter Setting CN site    Population Status Unknown   Has apartment at [facility]    Insurance/Financial Assistance Referral N/A    Medication Have Medication Insecurities;Patient Medications Reviewed;Provided Medication Assistance    Medical Provider Yes    Screening Referrals Made N/A    Medical Referrals Made N/A    Medical Appointment Completed N/A    CNP Interventions Advocate/Support;Counsel;Case Management;Spiritual Care;Navigate Healthcare System    Screenings CN Performed N/A    ED Visit Averted N/A    Life-Saving Intervention Made N/A

## 2023-10-06 NOTE — Congregational Nurse Program (Signed)
  Dept: [phone]   Congregational Nurse Program Note  Date of Encounter: 10/06/2023  Returned telephone call to resident.  Resident currently inpatient at [facility].  States that her brother visited her at her apartment on Sunday and took her to the hospital because of nausea and pain.  Had taken antibiotic as prescribed but continued to have urinary symptoms from the kidney stones.  Advised resident to notify [facility] [place] case Production designer, theatre/television/film, Mr. Myrna, when she is discharged from the hospital. Past Medical History: Past Medical History:  Diagnosis Date   Diabetes mellitus without complication (HCC)    DiGeorge syndrome (HCC)    Ectrodactyly    Genetic defect    genetic mutation of unknown significance per [facility] genetic notes   GERD (gastroesophageal reflux disease)    Hypercholesterolemia    Hypertension    Intellectual disability    Microcephaly (HCC)    Pelvic mass    Ventral hernia     Encounter Details:  Community Questionnaire - 10/06/23 1530       Questionnaire   Ask client: Do you give verbal consent for me to treat you today? Yes    Student Assistance N/A    Location Patient [place]    Encounter Setting Phone/Text/Email    Population Status Unknown   Has apartment at [facility]    Insurance/Financial Assistance Referral N/A    Medication N/A    Medical Provider Yes    Screening Referrals Made N/A    Medical Referrals Made N/A    Medical Appointment Completed N/A    CNP Interventions Advocate/Support;Counsel    Screenings CN Performed N/A    ED Visit Averted N/A    Life-Saving Intervention Made N/A

## 2023-10-13 NOTE — Congregational Nurse Program (Signed)
  Dept: [phone]   Congregational Nurse Program Note  Date of Encounter: 10/13/2023  Resident of [place] had come to [employer] for the lunch meal found sitting on sidewalk head slumped. Answered when spoken to and was oriented.  States she had taken the pain medicine recently prescribed for the pain from the kidney stone.  Temperature 98.5, pulse 83, O2 Sat 96%.  Educated regarding staying at home when taking the pain medication.  Assisted with weekly Trulicity injection in left lower quadrant of abdomen.  Taken to bust stop by [employer]. Past Medical History: Past Medical History:  Diagnosis Date   Diabetes mellitus without complication (HCC)    DiGeorge syndrome (HCC)    Ectrodactyly    Genetic defect    genetic mutation of unknown significance per [facility] genetic notes   GERD (gastroesophageal reflux disease)    Hypercholesterolemia    Hypertension    Intellectual disability    Microcephaly (HCC)    Pelvic mass    Ventral hernia     Encounter Details:  Community Questionnaire - 10/13/23 1114       Questionnaire   Ask client: Do you give verbal consent for me to treat you today? Yes    Student Assistance N/A    Location Patient Served  [facility]    Encounter Setting CN site    Population Status Unknown   Has apartment at [facility]    Insurance/Financial Assistance Referral N/A    Medication Provided Medication Assistance    Medical Provider Yes    Screening Referrals Made N/A    Medical Referrals Made N/A    Medical Appointment Completed N/A    CNP Interventions Advocate/Support;Counsel;Educate    Screenings CN Performed N/A    ED Visit Averted Yes    Life-Saving Intervention Made N/A

## 2023-10-16 ENCOUNTER — Emergency Department (HOSPITAL_COMMUNITY)

## 2023-10-16 ENCOUNTER — Encounter (HOSPITAL_COMMUNITY): Payer: Self-pay | Admitting: *Deleted

## 2023-10-16 ENCOUNTER — Other Ambulatory Visit: Payer: Self-pay

## 2023-10-16 ENCOUNTER — Emergency Department (HOSPITAL_COMMUNITY)
Admission: EM | Admit: 2023-10-16 | Discharge: 2023-10-16 | Disposition: A | Discharge status: Home / Self Care | Attending: Emergency Medicine | Admitting: Emergency Medicine

## 2023-10-16 DIAGNOSIS — Z79899 Other long term (current) drug therapy: Secondary | ICD-10-CM | POA: Diagnosis not present

## 2023-10-16 DIAGNOSIS — Z5900 Homelessness unspecified: Secondary | ICD-10-CM | POA: Insufficient documentation

## 2023-10-16 DIAGNOSIS — I10 Essential (primary) hypertension: Secondary | ICD-10-CM | POA: Insufficient documentation

## 2023-10-16 DIAGNOSIS — R109 Unspecified abdominal pain: Secondary | ICD-10-CM | POA: Diagnosis present

## 2023-10-16 DIAGNOSIS — Z7982 Long term (current) use of aspirin: Secondary | ICD-10-CM | POA: Diagnosis not present

## 2023-10-16 DIAGNOSIS — Z7984 Long term (current) use of oral hypoglycemic drugs: Secondary | ICD-10-CM | POA: Insufficient documentation

## 2023-10-16 DIAGNOSIS — E871 Hypo-osmolality and hyponatremia: Secondary | ICD-10-CM | POA: Insufficient documentation

## 2023-10-16 DIAGNOSIS — R112 Nausea with vomiting, unspecified: Secondary | ICD-10-CM | POA: Diagnosis not present

## 2023-10-16 DIAGNOSIS — E1165 Type 2 diabetes mellitus with hyperglycemia: Secondary | ICD-10-CM | POA: Diagnosis not present

## 2023-10-16 DIAGNOSIS — R1032 Left lower quadrant pain: Secondary | ICD-10-CM | POA: Insufficient documentation

## 2023-10-16 LAB — COMPREHENSIVE METABOLIC PANEL WITH GFR
ALT: 20 U/L (ref 0–44)
AST: 20 U/L (ref 15–41)
Albumin: 3.6 g/dL (ref 3.5–5.0)
Alkaline Phosphatase: 95 U/L (ref 38–126)
Anion gap: 17 — ABNORMAL HIGH (ref 5–15)
BUN: 21 mg/dL — ABNORMAL HIGH (ref 6–20)
CO2: 22 mmol/L (ref 22–32)
Calcium: 9.2 mg/dL (ref 8.9–10.3)
Chloride: 94 mmol/L — ABNORMAL LOW (ref 98–111)
Creatinine, Ser: 0.63 mg/dL (ref 0.44–1.00)
GFR, Estimated: >60 mL/min (ref >60)
Glucose, Bld: 235 mg/dL — ABNORMAL HIGH (ref 70–99)
Potassium: 4.3 mmol/L (ref 3.5–5.1)
Sodium: 133 mmol/L — ABNORMAL LOW (ref 135–145)
Total Bilirubin: 1.2 mg/dL (ref 0.0–1.2)
Total Protein: 8.6 g/dL — ABNORMAL HIGH (ref 6.5–8.1)

## 2023-10-16 LAB — LIPASE, BLOOD: Lipase: 36 U/L (ref 11–51)

## 2023-10-16 LAB — URINALYSIS, ROUTINE W REFLEX MICROSCOPIC
Bacteria, UA: NONE SEEN
Bilirubin Urine: NEGATIVE
Glucose, UA: >=500 mg/dL — AB
Ketones, ur: 80 mg/dL — AB
Nitrite: NEGATIVE
Protein, ur: 100 mg/dL — AB
RBC / HPF: >50 RBC/hpf (ref 0–5)
Specific Gravity, Urine: 1.014 (ref 1.005–1.030)
WBC, UA: >50 WBC/hpf (ref 0–5)
pH: 7 (ref 5.0–8.0)

## 2023-10-16 LAB — CBC
HCT: 38.5 % (ref 36.0–46.0)
Hemoglobin: 12.4 g/dL (ref 12.0–15.0)
MCH: 28 pg (ref 26.0–34.0)
MCHC: 32.2 g/dL (ref 30.0–36.0)
MCV: 86.9 fL (ref 80.0–100.0)
Platelets: 339 K/uL (ref 150–400)
RBC: 4.43 MIL/uL (ref 3.87–5.11)
RDW: 13.3 % (ref 11.5–15.5)
WBC: 5.4 K/uL (ref 4.0–10.5)
nRBC: 0 % (ref 0.0–0.2)

## 2023-10-16 LAB — HCG, SERUM, QUALITATIVE: Preg, Serum: NEGATIVE

## 2023-10-16 MED ORDER — HYDROCODONE-ACETAMINOPHEN 5-325 MG PO TABS: 1.0000 | ORAL_TABLET | Freq: Four times a day (QID) | ORAL | 0 refills | Status: AC | PRN

## 2023-10-16 MED ORDER — SODIUM CHLORIDE 0.9 % IV BOLUS
1000.0000 mL | Freq: Once | INTRAVENOUS | Status: AC
  Administered 2023-10-16: 1000 mL via INTRAVENOUS

## 2023-10-16 MED ORDER — MORPHINE SULFATE (PF) 4 MG/ML IV SOLN
4.0000 mg | Freq: Once | INTRAVENOUS | Status: AC
  Administered 2023-10-16: 4 mg via INTRAVENOUS
  Filled 2023-10-16: qty 1

## 2023-10-16 MED ORDER — IOHEXOL 300 MG/ML  SOLN
100.0000 mL | Freq: Once | INTRAMUSCULAR | Status: AC | PRN
  Administered 2023-10-16: 100 mL via INTRAVENOUS

## 2023-10-16 MED ORDER — ONDANSETRON HCL 4 MG/2ML IJ SOLN
4.0000 mg | Freq: Once | INTRAMUSCULAR | Status: AC
  Administered 2023-10-16: 4 mg via INTRAVENOUS
  Filled 2023-10-16: qty 2

## 2023-10-16 NOTE — ED Provider Notes (Signed)
 [place] EMERGENCY DEPARTMENT AT [facility] Provider Note   CSN: [id number] Arrival date & time: 10/16/23  1501     Patient presents with: Abdominal Pain   [name] is a 48 y.o. female with past medical history significant for HLD, diabetes, HTN, DiGeorge syndrome, microcephaly who was treated recently for kidney stone with stent in place on left presents with ongoing abdominal pain, nausea, vomiting. Denies fever, chills.     Abdominal Pain      Prior to Admission medications   Medication Sig Start Date End Date Taking? Authorizing Provider  HYDROcodone -acetaminophen  (NORCO/VICODIN) 5-325 MG tablet Take 1 tablet by mouth every 6 (six) hours as needed. 10/16/23  Yes Nashalie Sallis H, PA-C  Accu-Chek Softclix Lancets lancets Use 3 (three) times daily as directed 08/08/21   Brien Belvie BRAVO, MD  ASPIRIN  LOW DOSE 81 MG EC tablet Take 1 tablet (81 mg total) by mouth daily. Patient taking differently: Take 81 mg by mouth at bedtime. 07/24/21   Brien Belvie BRAVO, MD  atorvastatin (LIPITOR) 40 MG tablet Take 40 mg by mouth at bedtime. 09/02/23   [provider]  Dulaglutide 1.5 MG/0.5ML SOAJ Inject 1.5 mg into the skin every Tuesday. 10/14/22   [provider]  ferrous sulfate  300 (60 Fe) MG/5ML syrup Take 5 mLs (300 mg total) by mouth daily. 06/28/22   Suellen Cantor A, PA-C  glucose blood (ACCU-CHEK GUIDE) test strip Use 3 (three) times daily as instructed 07/03/21   Brien Belvie BRAVO, MD  insulin  glargine (LANTUS ) 100 UNIT/ML Solostar Pen Inject 15 Units into the skin See admin instructions. Inject 60 units into the skin in the morning and 16 units at bedtime 09/28/23   Odell Celinda Balo, MD  metFORMIN  (GLUCOPHAGE ) 500 MG tablet Take 1 tablet (500 mg total) by mouth daily with breakfast. 09/28/23   Odell Celinda Balo, MD  omeprazole  (PRILOSEC) 20 MG capsule Take 1 capsule (20 mg total) by mouth daily. Patient taking differently: Take 20 mg by  mouth daily before breakfast. 06/26/17   Mitchell, Jessica B, PA-C  ondansetron  (ZOFRAN -ODT) 4 MG disintegrating tablet Take 1 tablet (4 mg total) by mouth every 8 (eight) hours as needed for nausea or vomiting. 09/28/23   Odell Celinda Balo, MD  Vitamin D, Ergocalciferol, (DRISDOL) 1.25 MG (50000 UNIT) CAPS capsule Take 50,000 Units by mouth once a week. Patient not taking: Reported on 09/24/2023 06/11/23   [provider]    Allergies: Patient has no known allergies.    Review of Systems  Gastrointestinal:  Positive for abdominal pain.  All other systems reviewed and are negative.   Updated Vital Signs BP (!) 153/86 (BP Location: Left Arm)   Pulse (!) 108   Temp 98 F (36.7 C) (Oral)   Resp 20   Wt 39.4 kg   LMP 07/17/2023 (Approximate) Comment: 3 months ago  SpO2 100%   BMI 18.80 kg/m   Physical Exam Vitals and nursing note reviewed.  Constitutional:      General: She is not in acute distress.    Appearance: Normal appearance.  HENT:     Head: Normocephalic and atraumatic.  Eyes:     General:        Right eye: No discharge.        Left eye: No discharge.  Cardiovascular:     Rate and Rhythm: Normal rate and regular rhythm.     Heart sounds: No murmur heard.    No friction rub. No gallop.  Pulmonary:     Effort: Pulmonary effort is normal.     Breath sounds: Normal breath sounds.  Abdominal:     General: Bowel sounds are normal.     Palpations: Abdomen is soft.     Comments: Some ttp in llq, no rebound, rigidity, guarding  Skin:    General: Skin is warm and dry.     Capillary Refill: Capillary refill takes less than 2 seconds.  Neurological:     Mental Status: She is alert and oriented to person, place, and time.  Psychiatric:        Mood and Affect: Mood normal.        Behavior: Behavior normal.     (all labs ordered are listed, but only abnormal results are displayed) Labs Reviewed  COMPREHENSIVE METABOLIC PANEL WITH GFR - Abnormal; Notable for  the following components:      Result Value   Sodium 133 (*)    Chloride 94 (*)    Glucose, Bld 235 (*)    BUN 21 (*)    Total Protein 8.6 (*)    Anion gap 17 (*)    All other components within normal limits  URINALYSIS, ROUTINE W REFLEX MICROSCOPIC - Abnormal; Notable for the following components:   APPearance TURBID (*)    Glucose, UA >=500 (*)    Hgb urine dipstick MODERATE (*)    Ketones, ur 80 (*)    Protein, ur 100 (*)    Leukocytes,Ua LARGE (*)    Non Squamous Epithelial 0-5 (*)    All other components within normal limits  LIPASE, BLOOD  CBC  HCG, SERUM, QUALITATIVE    EKG: None  Radiology: CT ABDOMEN PELVIS W CONTRAST Result Date: 10/16/2023 CLINICAL DATA:  Acute abdominal pain.  Nausea and vomiting. EXAM: CT ABDOMEN AND PELVIS WITH CONTRAST TECHNIQUE: Multidetector CT imaging of the abdomen and pelvis was performed using the standard protocol following bolus administration of intravenous contrast. RADIATION DOSE REDUCTION: This exam was performed according to the departmental dose-optimization program which includes automated exposure control, adjustment of the mA and/or kV according to patient size and/or use of iterative reconstruction technique. CONTRAST:  OMNIPAQUE  IOHEXOL  300 MG/ML  SOLN COMPARISON:  CT abdomen and pelvis 10/05/2023. FINDINGS: Lower chest: No acute abnormality. Hepatobiliary: No focal liver abnormality is seen. Status post cholecystectomy. No biliary dilatation. Pancreas: Unremarkable. No pancreatic ductal dilatation or surrounding inflammatory changes. Spleen: Normal in size without focal abnormality. Adrenals/Urinary Tract: Left ureteral stent in place. There is no hydronephrosis. Minimal patchy hypodensity seen in the cortex of the superior pole the left kidney. This appears new from prior. The adrenal glands and bladder are within normal limits. Stomach/Bowel: Stomach is within normal limits. No evidence of bowel wall thickening, distention, or  inflammatory changes. There is a large amount of stool throughout the colon. The appendix is not seen. Vascular/Lymphatic: Aortic atherosclerosis. No enlarged abdominal or pelvic lymph nodes. Reproductive: Enlarged heterogeneous uterus containing multiple fibroids appears unchanged. Largest focal fibroid measures 5 cm. Other: No abdominal wall hernia or abnormality. No abdominopelvic ascites. Musculoskeletal: No acute or significant osseous findings. IMPRESSION: 1. Left ureteral stent in place. No hydronephrosis. 2. Minimal patchy hypodensity in the cortex of the superior pole of the left kidney, new from prior, worrisome for pyelonephritis. 3. Large amount of stool throughout the colon. 4. Fibroid uterus. 5. Aortic atherosclerosis. Aortic Atherosclerosis (ICD10-I70.0). Electronically Signed   By: Greig Pique M.D.   On: 10/16/2023 20:24     Procedures  Medications Ordered in the ED  morphine  (PF) 4 MG/ML injection 4 mg (4 mg Intravenous Given 10/16/23 1556)  ondansetron  (ZOFRAN ) injection 4 mg (4 mg Intravenous Given 10/16/23 1556)  sodium chloride  0.9 % bolus 1,000 mL (0 mLs Intravenous Stopped 10/16/23 1846)  morphine  (PF) 4 MG/ML injection 4 mg (4 mg Intravenous Given 10/16/23 1928)  iohexol  (OMNIPAQUE ) 300 MG/ML solution 100 mL (100 mLs Intravenous Contrast Given 10/16/23 1953)                                    Medical Decision Making  This patient is a 48 y.o. female  who presents to the ED for concern of abdominal pain.   Differential diagnoses prior to evaluation: The emergent differential diagnosis includes, but is not limited to,  The causes of generalized abdominal pain include but are not limited to AAA, mesenteric ischemia, appendicitis, diverticulitis, DKA, gastritis, gastroenteritis, AMI, nephrolithiasis, pancreatitis, peritonitis, adrenal insufficiency,lead poisoning, iron toxicity, intestinal ischemia, constipation, UTI,SBO/LBO, splenic rupture, biliary disease, IBD, IBS, PUD, or  hepatitis. This is not an exhaustive differential.   Past Medical History / Co-morbidities / Social History: DKA, hyponatremia, hyperkalemia, homelessness, intellectual disability, microcephaly, diabetes, DiGeorge syndrome  Additional history: Chart reviewed. Pertinent results include: Reviewed lab work, imaging from previous emergency department visits.  Physical Exam: Physical exam performed. The pertinent findings include: Diffusely tender throughout the abdomen, more focally on the left lower quadrant, no rebound, rigidity, guarding.  No CVA tenderness.  Lab Tests/Imaging studies: I personally interpreted labs/imaging and the pertinent results include: CMP notable for mild hyponatremia, sodium 133, glucose elevated 235, she does have a mild anion gap at 17.  Normal CBC, unremarkable lipase, negative serum pregnancy test, UA shows some leukocytes, no bacteria, independently interpreted CT abdomen pelvis with contrast which shows  1. Left ureteral stent in place. No hydronephrosis.  2. Minimal patchy hypodensity in the cortex of the superior pole of  the left kidney, new from prior, worrisome for pyelonephritis.  3. Large amount of stool throughout the colon.  4. Fibroid uterus.  5. Aortic atherosclerosis.  . I agree with the radiologist interpretation.   Medications: I ordered medication including morphine , fluid bolus, Zofran  for pain, nausea, tachycardia.  I have reviewed the patients home medicines and have made adjustments as needed.   Disposition: After consideration of the diagnostic results and the patients response to treatment, I feel that despite CT with some hypodensity in the kidney, findings appear consistent with her recent previous diagnosis of possible Pilo related to stent, but her urine appears unchanged.  Will plan to discharge her for course of pain medicine, otherwise stable at this time for discharge.  Do not think repeat antibiotics are indicated  emergency  department workup does not suggest an emergent condition requiring admission or immediate intervention beyond what has been performed at this time. The plan is: as above. The patient is safe for discharge and has been instructed to return immediately for worsening symptoms, change in symptoms or any other concerns.   Final diagnoses:  Left lower quadrant abdominal pain    ED Discharge Orders          Ordered    HYDROcodone -acetaminophen  (NORCO/VICODIN) 5-325 MG tablet  Every 6 hours PRN        10/16/23 2057               Rosan Sherlean DEL, PA-C 10/16/23 2101  Tegeler, Lonni PARAS, MD 10/17/23 [phone]

## 2023-10-16 NOTE — Discharge Instructions (Addendum)
 Please use Tylenol or ibuprofen for pain.  You may use 600 mg ibuprofen every 6 hours or 1000 mg of Tylenol every 6 hours.  You may choose to alternate between the 2.  This would be most effective.  Not to exceed 4 g of Tylenol within 24 hours.  Not to exceed 3200 mg ibuprofen 24 hours.  You can use the stronger narcotic pain medication in place of Tylenol for severe break through pain.  If you take the narcotic pain medication that we prescribed recommend that you also take a laxative such as MiraLAX or Dulcolax every day that you take the narcotic pain medicine, and drink plenty of fluids, 50 to 64 ounces to prevent any constipation.

## 2023-10-16 NOTE — ED Notes (Signed)
 PT stated that she wanted to stay here for the night because she couldn't get a ride home. Nurse offered PT a bus pass and PT stated that if she has to take the bus then  it's going to be the same thing and I am going to fall out walking to the bus.

## 2023-10-16 NOTE — ED Triage Notes (Addendum)
 BIB [facility] from home for recurrent general abd pain, and NV. Abd soft, and NT. Initial back pain resolved. Sx ongoing for months. Sx intermittent, recurrent and fluctuates. Seen recently in a [facility] ED for same. Endorses h/o kidney stone with stents. Urinary stents remain in place. VSS. CBG 195. HR 120. Has been given Rx for nausea but did not take. Vomiting on arrival. No emesis with EMS. Alert, NAD, calm, interactive, steady gait, but mentions has been dizzy, and constipated, last BM was last ED visit, 6/26.

## 2023-10-16 NOTE — ED Provider Notes (Signed)
 I provided a substantive portion of the care of this patient.  I personally made/approved the management plan for this patient and take responsibility for the patient management.     I extensively reviewed the EMR with  prior hospitalization.  Patient has a stent in place and was cultured for pyelonephritis.  Culture negative.  Patient presents today with ongoing abdominal pain.  Symptoms have been ongoing and fluctuating for a number of months.  Patient reports that pain medications at discharge from her prior hospitalization were not effective in controlling pain.  Patient is alert and nontoxic.  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  Heart is regular.  Lungs clear.  Abdomen is soft without guarding.  Extremities warm and dry.  I have reviewed diagnostic results with normal GFR CBC normal H&H normal patient's urinalysis greater than 50 RBCs greater than 50 WBCs.  This correlates with multiple prior specimens.  Most recent culture was negative.  We will reculture her urine but at this time I do not think that empiric antibiotics are appropriate.  Patient reports she was not pain control.  Will administer short course of pain medication for home use and recommend close follow-up.   Armenta Canning, MD 10/16/23 2059

## 2023-10-20 LAB — URINE CULTURE: Culture: >=100000 — AB

## 2023-10-20 NOTE — Congregational Nurse Program (Signed)
  Dept: [phone]   Congregational Nurse Program Note  Date of Encounter: 10/20/2023  Telephone call to resident who is in-patient at [facility].  States she is having less pain but still not feeling well. Reminded to notify [place] case Production designer, theatre/television/film when she is discharged from the hospital.   Past Medical History: Past Medical History:  Diagnosis Date   Diabetes mellitus without complication (HCC)    DiGeorge syndrome (HCC)    Ectrodactyly    Genetic defect    genetic mutation of unknown significance per [facility] genetic notes   GERD (gastroesophageal reflux disease)    Hypercholesterolemia    Hypertension    Intellectual disability    Microcephaly (HCC)    Pelvic mass    Ventral hernia     Encounter Details:  Community Questionnaire - 10/20/23 1618       Questionnaire   Ask client: Do you give verbal consent for me to treat you today? Yes    Student Assistance N/A    Location Patient [place]    Encounter Setting Phone/Text/Email    Population Status Unknown   Has apartment at [facility]    Insurance/Financial Assistance Referral N/A    Medication N/A    Medical Provider Yes    Screening Referrals Made N/A    Medical Referrals Made N/A    Medical Appointment Completed N/A    CNP Interventions Advocate/Support    Screenings CN Performed N/A    ED Visit Averted N/A    Life-Saving Intervention Made N/A

## 2023-10-27 NOTE — Congregational Nurse Program (Signed)
  Dept: [phone]   Congregational Nurse Program Note  Date of Encounter: 10/27/2023  Resident remains hospitalized at [facility], per telephone stated she had low blood glucose of 48 this AM after given extra insulin  Monday evening for a high blood glucose.  Reports that removal of kidney stone scheduled for next week may be performed as inpatient while she is in the hospital. Past Medical History: Past Medical History:  Diagnosis Date   Diabetes mellitus without complication (HCC)    DiGeorge syndrome (HCC)    Ectrodactyly    Genetic defect    genetic mutation of unknown significance per [facility] genetic notes   GERD (gastroesophageal reflux disease)    Hypercholesterolemia    Hypertension    Intellectual disability    Microcephaly (HCC)    Pelvic mass    Ventral hernia     Encounter Details:  Community Questionnaire - 10/27/23 1452       Questionnaire   Ask client: Do you give verbal consent for me to treat you today? Yes    Student Assistance N/A    Location Patient [place]    Encounter Setting Phone/Text/Email    Population Status Unknown   Has apartment at [facility]    Insurance/Financial Assistance Referral N/A    Medication N/A    Medical Provider Yes    Screening Referrals Made N/A    Medical Referrals Made N/A    Medical Appointment Completed N/A    CNP Interventions Advocate/Support;Counsel    Screenings CN Performed N/A    ED Visit Averted N/A    Life-Saving Intervention Made N/A

## 2023-11-03 NOTE — Congregational Nurse Program (Signed)
  Dept: [phone]   Congregational Nurse Program Note  Date of Encounter: 11/03/2023  Telephone call to resident at [facility].  Resident states that stent was removed and that she is to be discharged today.  States she has not been given Trulicity while hospitalized.  Notified [place] case Production designer, theatre/television/film that resident is to be retuning to her apartment today or tomorrow if no change in her condition.   Past Medical History: Past Medical History:  Diagnosis Date   Diabetes mellitus without complication (HCC)    DiGeorge syndrome (HCC)    Ectrodactyly    Genetic defect    genetic mutation of unknown significance per [facility] genetic notes   GERD (gastroesophageal reflux disease)    Hypercholesterolemia    Hypertension    Intellectual disability    Microcephaly (HCC)    Pelvic mass    Ventral hernia     Encounter Details:  Community Questionnaire - 11/03/23 1345       Questionnaire   Ask client: Do you give verbal consent for me to treat you today? Yes    Student Assistance N/A    Location Patient [place]    Encounter Setting Phone/Text/Email    Population Status Unknown   Has apartment at [facility]    Insurance/Financial Assistance Referral N/A    Medication N/A    Medical Provider Yes    Screening Referrals Made N/A    Medical Referrals Made N/A    Medical Appointment Completed N/A    CNP Interventions Advocate/Support;Counsel    Screenings CN Performed N/A    ED Visit Averted N/A    Life-Saving Intervention Made N/A

## 2023-11-04 ENCOUNTER — Encounter: Admitting: Urology

## 2023-11-04 NOTE — Progress Notes (Deleted)
   Assessment: 1. Nephrolithiasis   2. History of UTI     Plan: I personally reviewed the patient's chart including provider notes, lab and imaging results. Her left ureteral stent has been removed.   Chief Complaint: No chief complaint on file.   HPI: [name] is a 48 y.o. female who presents for continued evaluation of left ureteral calculus. She was seen as a hospital consult on 09/24/2023.  She presented to the emergency room with left flank pain x 1 week with associated nausea.  Urinalysis showed many bacteria, >50 WBCs.  Her lactic acid was elevated.  CT abdomen and pelvis showed left hydronephrosis and left proximal ureter with a punctate calcification in the proximal left ureter.  She underwent cystoscopy, left retrograde pyelogram, and insertion of a left ureteral stent. She was recently admitted to [facility].  CT imaging from 10/16/2023 showed the left ureteral stent in place, no obvious ureteral stone seen.  A small left lower pole renal calculus noted. She underwent removal of the left ureteral stent by Dr. Gwenith on 10/28/2023. She was on Zyvox at that time for treatment of staph UTI.  Portions of the above documentation were copied from a prior visit for review purposes only.  Allergies: No Known Allergies  PMH: Past Medical History:  Diagnosis Date   Diabetes mellitus without complication (HCC)    DiGeorge syndrome (HCC)    Ectrodactyly    Genetic defect    genetic mutation of unknown significance per [facility] genetic notes   GERD (gastroesophageal reflux disease)    Hypercholesterolemia    Hypertension    Intellectual disability    Microcephaly (HCC)    Pelvic mass    Ventral hernia     PSH: Past Surgical History:  Procedure Laterality Date   CHOLECYSTECTOMY     CYSTOSCOPY W/ URETERAL STENT PLACEMENT Left 09/24/2023   Procedure: CYSTOSCOPY, WITH RETROGRADE PYELOGRAM AND URETERAL STENT INSERTION;  Surgeon: Roseann Adine PARAS., MD;   Location: [facility] OR;  Service: Urology;  Laterality: Left;   HAND SURGERY Right    HERNIA REPAIR      SH: Social History   Tobacco Use   Smoking status: Never   Smokeless tobacco: Never  Vaping Use   Vaping status: Never Used  Substance Use Topics   Alcohol use: No   Drug use: No    ROS: Constitutional:  Negative for fever, chills, weight loss CV: Negative for chest pain, previous MI, hypertension Respiratory:  Negative for shortness of breath, wheezing, sleep apnea, frequent cough GI:  Negative for nausea, vomiting, bloody stool, GERD  PE: LMP 07/17/2023 (Approximate) Comment: 3 months ago GENERAL APPEARANCE:  Well appearing, well developed, well nourished, NAD HEENT:  Atraumatic, normocephalic, oropharynx clear NECK:  Supple without lymphadenopathy or thyromegaly ABDOMEN:  Soft, non-tender, no masses EXTREMITIES:  Moves all extremities well, without clubbing, cyanosis, or edema NEUROLOGIC:  Alert and oriented x 3, normal gait, CN II-XII grossly intact MENTAL STATUS:  appropriate BACK:  Non-tender to palpation, No CVAT SKIN:  Warm, dry, and intact   Results: U/A:

## 2023-11-17 NOTE — Congregational Nurse Program (Signed)
  Dept: [phone]   Congregational Nurse Program Note  Date of Encounter: 11/17/2023  Telephone call from resident, she is not coming to [facility] clinic today, has PT scheduled for Wednesday and Friday and PCP appointment on Thursday.  States blood glucose 271 this am but just taking 40 units of insulin  now since she was out doing errands today. Discussed normal AM glucose levels and usual target levels for those with diabetes.  Recommended that she discuss the glucose with her PCP to determine if evening insulin  dosage needs adjustment.  Advised her to check blood glucose before going to bed. Past Medical History: Past Medical History:  Diagnosis Date   Diabetes mellitus without complication (HCC)    DiGeorge syndrome (HCC)    Ectrodactyly    Genetic defect    genetic mutation of unknown significance per [facility] genetic notes   GERD (gastroesophageal reflux disease)    Hypercholesterolemia    Hypertension    Intellectual disability    Microcephaly (HCC)    Pelvic mass    Ventral hernia     Encounter Details:  Community Questionnaire - 11/17/23 1705       Questionnaire   Ask client: Do you give verbal consent for me to treat you today? Yes    Student Assistance N/A    Location [place]    Encounter Setting Phone/Text/Email    Population Status Unknown   Has apartment at [facility]    Insurance/Financial Assistance Referral N/A    Medication N/A    Medical Provider Yes    Screening Referrals Made N/A    Medical Referrals Made N/A    Medical Appointment Completed N/A    CNP Interventions Advocate/Support;Counsel    Screenings CN Performed N/A    ED Visit Averted N/A    Life-Saving Intervention Made N/A

## 2023-12-01 LAB — GLUCOSE, POCT (MANUAL RESULT ENTRY): POC Glucose: 207 mg/dL — AB (ref 70–99)

## 2023-12-01 NOTE — Congregational Nurse Program (Signed)
  Dept: [phone]   Congregational Nurse Program Note  Date of Encounter: 11/24/2023  Telephone call to resident to confirm that she had transportation arranged for her post hospital PT. Discussed blood glucose test results and insulin  that she is taking. Past Medical History: Past Medical History:  Diagnosis Date   Diabetes mellitus without complication (HCC)    DiGeorge syndrome (HCC)    Ectrodactyly    Genetic defect    genetic mutation of unknown significance per [facility] genetic notes   GERD (gastroesophageal reflux disease)    Hypercholesterolemia    Hypertension    Intellectual disability    Microcephaly (HCC)    Pelvic mass    Ventral hernia     Encounter Details:  Community Questionnaire - 11/24/23 1712       Questionnaire   Ask client: Do you give verbal consent for me to treat you today? Yes    Student Assistance N/A    Location [place]    Encounter Setting Phone/Text/Email    Population Status Unknown   Has apartment at [facility]    Insurance/Financial Assistance Referral N/A    Medication Patient Medications Reviewed    Medical Provider Yes    Screening Referrals Made N/A    Medical Referrals Made N/A    Medical Appointment Completed N/A    CNP Interventions Advocate/Support;Counsel    Screenings CN Performed N/A    ED Visit Averted N/A    Life-Saving Intervention Made N/A

## 2023-12-01 NOTE — Congregational Nurse Program (Signed)
  Dept: [phone]   Congregational Nurse Program Note  Date of Encounter: 12/01/2023  Past Medical History: Past Medical History:  Diagnosis Date   Diabetes mellitus without complication (HCC)    DiGeorge syndrome (HCC)    Ectrodactyly    Genetic defect    genetic mutation of unknown significance per [facility] genetic notes   GERD (gastroesophageal reflux disease)    Hypercholesterolemia    Hypertension    Intellectual disability    Microcephaly (HCC)    Pelvic mass    Ventral hernia     Encounter Details:  Community Questionnaire - 12/01/23 1645       Questionnaire   Ask client: Do you give verbal consent for me to treat you today? Yes    Student Assistance [employer] Nurse    Location Patient Served  [place]    Encounter Setting Phone/Text/Email;CN site    Population Status Unknown   Has apartment at [facility]    Insurance/Financial Assistance Referral N/A    Medication Patient Medications Reviewed    Medical Provider Yes    Screening Referrals Made N/A    Medical Referrals Made N/A    Medical Appointment Completed N/A    CNP Interventions Advocate/Support;Counsel;Case Management;Educate    Screenings CN Performed Blood Pressure;Blood Glucose    ED Visit Averted Yes    Life-Saving Intervention Made N/A            Dept: [phone]   Congregational Nurse Program Note  Date of Encounter: 12/01/2023  Visit to clinic after telephone call to resident to confirm that she has transportation to her PT appointments on Wednesday and Friday.  Came to clinic after walking to local store, stated that she had not eaten all day and that blood glucose this AM was 195.    Given water to drink, BP 84/60, pulse 90 and regular, O2 Sat 99%.  Blood glucose 207, discussed need to drink water to stay hydrated, to eat regular meals and necessity to take insulin  as prescribed. Past Medical History: Past Medical History:  Diagnosis Date    Diabetes mellitus without complication (HCC)    DiGeorge syndrome (HCC)    Ectrodactyly    Genetic defect    genetic mutation of unknown significance per [facility] genetic notes   GERD (gastroesophageal reflux disease)    Hypercholesterolemia    Hypertension    Intellectual disability    Microcephaly (HCC)    Pelvic mass    Ventral hernia     Encounter Details:  Community Questionnaire - 12/01/23 1645       Questionnaire   Ask client: Do you give verbal consent for me to treat you today? Yes    Student Assistance [employer] Nurse    Location Patient [place]    Encounter Setting Phone/Text/Email;CN site    Population Status Unknown   Has apartment at [facility]    Insurance/Financial Assistance Referral N/A    Medication Patient Medications Reviewed    Medical Provider Yes    Screening Referrals Made N/A    Medical Referrals Made N/A    Medical Appointment Completed N/A    CNP Interventions Advocate/Support;Counsel;Case Management;Educate    Screenings CN Performed Blood Pressure;Blood Glucose    ED Visit Averted Yes    Life-Saving Intervention Made N/A

## 2023-12-15 ENCOUNTER — Encounter: Payer: Self-pay | Admitting: Podiatry

## 2023-12-15 ENCOUNTER — Ambulatory Visit (INDEPENDENT_AMBULATORY_CARE_PROVIDER_SITE_OTHER): Admitting: Podiatry

## 2023-12-15 DIAGNOSIS — B351 Tinea unguium: Secondary | ICD-10-CM

## 2023-12-15 DIAGNOSIS — M79675 Pain in left toe(s): Secondary | ICD-10-CM | POA: Diagnosis not present

## 2023-12-15 DIAGNOSIS — E1142 Type 2 diabetes mellitus with diabetic polyneuropathy: Secondary | ICD-10-CM

## 2023-12-15 DIAGNOSIS — M79674 Pain in right toe(s): Secondary | ICD-10-CM | POA: Diagnosis not present

## 2023-12-20 ENCOUNTER — Other Ambulatory Visit: Payer: Self-pay

## 2023-12-20 ENCOUNTER — Inpatient Hospital Stay (HOSPITAL_COMMUNITY)
Admission: EM | Admit: 2023-12-20 | Discharge: 2023-12-23 | DRG: 918 | Disposition: A | Discharge status: Home / Self Care | Attending: Internal Medicine | Admitting: Internal Medicine

## 2023-12-20 DIAGNOSIS — T383X1A Poisoning by insulin and oral hypoglycemic [antidiabetic] drugs, accidental (unintentional), initial encounter: Principal | ICD-10-CM | POA: Diagnosis present

## 2023-12-20 DIAGNOSIS — E1110 Type 2 diabetes mellitus with ketoacidosis without coma: Secondary | ICD-10-CM

## 2023-12-20 DIAGNOSIS — Z7985 Long-term (current) use of injectable non-insulin antidiabetic drugs: Secondary | ICD-10-CM

## 2023-12-20 DIAGNOSIS — Z7984 Long term (current) use of oral hypoglycemic drugs: Secondary | ICD-10-CM

## 2023-12-20 DIAGNOSIS — F79 Unspecified intellectual disabilities: Secondary | ICD-10-CM | POA: Diagnosis present

## 2023-12-20 DIAGNOSIS — Z888 Allergy status to other drugs, medicaments and biological substances status: Secondary | ICD-10-CM

## 2023-12-20 DIAGNOSIS — Z794 Long term (current) use of insulin: Secondary | ICD-10-CM

## 2023-12-20 DIAGNOSIS — E785 Hyperlipidemia, unspecified: Secondary | ICD-10-CM | POA: Diagnosis present

## 2023-12-20 DIAGNOSIS — Q02 Microcephaly: Secondary | ICD-10-CM

## 2023-12-20 DIAGNOSIS — Z9049 Acquired absence of other specified parts of digestive tract: Secondary | ICD-10-CM

## 2023-12-20 DIAGNOSIS — Z79899 Other long term (current) drug therapy: Secondary | ICD-10-CM

## 2023-12-20 DIAGNOSIS — E162 Hypoglycemia, unspecified: Principal | ICD-10-CM | POA: Diagnosis present

## 2023-12-20 DIAGNOSIS — K219 Gastro-esophageal reflux disease without esophagitis: Secondary | ICD-10-CM | POA: Diagnosis present

## 2023-12-20 DIAGNOSIS — E872 Acidosis, unspecified: Secondary | ICD-10-CM | POA: Diagnosis present

## 2023-12-20 DIAGNOSIS — Z7982 Long term (current) use of aspirin: Secondary | ICD-10-CM

## 2023-12-20 DIAGNOSIS — E7800 Pure hypercholesterolemia, unspecified: Secondary | ICD-10-CM | POA: Diagnosis present

## 2023-12-20 DIAGNOSIS — D821 Di George's syndrome: Secondary | ICD-10-CM | POA: Diagnosis present

## 2023-12-20 DIAGNOSIS — Z833 Family history of diabetes mellitus: Secondary | ICD-10-CM

## 2023-12-20 DIAGNOSIS — Z87440 Personal history of urinary (tract) infections: Secondary | ICD-10-CM

## 2023-12-20 DIAGNOSIS — E11649 Type 2 diabetes mellitus with hypoglycemia without coma: Principal | ICD-10-CM | POA: Diagnosis present

## 2023-12-20 DIAGNOSIS — I10 Essential (primary) hypertension: Secondary | ICD-10-CM | POA: Diagnosis present

## 2023-12-20 DIAGNOSIS — E119 Type 2 diabetes mellitus without complications: Secondary | ICD-10-CM

## 2023-12-20 DIAGNOSIS — E1165 Type 2 diabetes mellitus with hyperglycemia: Secondary | ICD-10-CM | POA: Diagnosis not present

## 2023-12-20 DIAGNOSIS — E871 Hypo-osmolality and hyponatremia: Secondary | ICD-10-CM | POA: Diagnosis present

## 2023-12-20 LAB — CBC WITH DIFFERENTIAL/PLATELET
Abs Immature Granulocytes: 0.03 K/uL (ref 0.00–0.07)
Basophils Absolute: 0 K/uL (ref 0.0–0.1)
Basophils Relative: 0 %
Eosinophils Absolute: 0.1 K/uL (ref 0.0–0.5)
Eosinophils Relative: 1 %
HCT: 36.7 % (ref 36.0–46.0)
Hemoglobin: 11.6 g/dL — ABNORMAL LOW (ref 12.0–15.0)
Immature Granulocytes: 1 %
Lymphocytes Relative: 16 %
Lymphs Abs: 0.9 K/uL (ref 0.7–4.0)
MCH: 28.8 pg (ref 26.0–34.0)
MCHC: 31.6 g/dL (ref 30.0–36.0)
MCV: 91.1 fL (ref 80.0–100.0)
Monocytes Absolute: 0.4 K/uL (ref 0.1–1.0)
Monocytes Relative: 6 %
Neutro Abs: 4.2 K/uL (ref 1.7–7.7)
Neutrophils Relative %: 76 %
Platelets: 318 K/uL (ref 150–400)
RBC: 4.03 MIL/uL (ref 3.87–5.11)
RDW: 13.2 % (ref 11.5–15.5)
WBC: 5.6 K/uL (ref 4.0–10.5)
nRBC: 0 % (ref 0.0–0.2)

## 2023-12-20 LAB — CBG MONITORING, ED
Glucose-Capillary: 52 mg/dL — ABNORMAL LOW (ref 70–99)
Glucose-Capillary: 82 mg/dL (ref 70–99)
Glucose-Capillary: 195 mg/dL — ABNORMAL HIGH (ref 70–99)
Glucose-Capillary: 314 mg/dL — ABNORMAL HIGH (ref 70–99)
Glucose-Capillary: 392 mg/dL — ABNORMAL HIGH (ref 70–99)

## 2023-12-20 LAB — COMPREHENSIVE METABOLIC PANEL WITH GFR
ALT: 28 U/L (ref 0–44)
AST: 26 U/L (ref 15–41)
Albumin: 3.6 g/dL (ref 3.5–5.0)
Alkaline Phosphatase: 77 U/L (ref 38–126)
Anion gap: 13 (ref 5–15)
BUN: 16 mg/dL (ref 6–20)
CO2: 19 mmol/L — ABNORMAL LOW (ref 22–32)
Calcium: 8.7 mg/dL — ABNORMAL LOW (ref 8.9–10.3)
Chloride: 99 mmol/L (ref 98–111)
Creatinine, Ser: 0.66 mg/dL (ref 0.44–1.00)
GFR, Estimated: >60 mL/min (ref >60)
Glucose, Bld: 188 mg/dL — ABNORMAL HIGH (ref 70–99)
Potassium: 3.5 mmol/L (ref 3.5–5.1)
Sodium: 131 mmol/L — ABNORMAL LOW (ref 135–145)
Total Bilirubin: 0.5 mg/dL (ref 0.0–1.2)
Total Protein: 7.5 g/dL (ref 6.5–8.1)

## 2023-12-20 LAB — MAGNESIUM: Magnesium: 1.6 mg/dL — ABNORMAL LOW (ref 1.7–2.4)

## 2023-12-20 LAB — HCG, SERUM, QUALITATIVE: Preg, Serum: NEGATIVE

## 2023-12-20 MED ORDER — DEXTROSE 10 % IV BOLUS
250.0000 mL | Freq: Once | INTRAVENOUS | Status: AC
  Administered 2023-12-20: 250 mL via INTRAVENOUS

## 2023-12-20 MED ORDER — DEXTROSE 10 % IV SOLN: INTRAVENOUS | Status: DC

## 2023-12-20 MED ORDER — SODIUM CHLORIDE 0.9% FLUSH
3.0000 mL | Freq: Two times a day (BID) | INTRAVENOUS | Status: DC
  Administered 2023-12-20 – 2023-12-23 (×6): 3 mL via INTRAVENOUS

## 2023-12-20 MED ORDER — SODIUM CHLORIDE 0.9 % IV SOLN: 250.0000 mL | INTRAVENOUS | Status: AC | PRN

## 2023-12-20 MED ORDER — MAGNESIUM SULFATE 2 GM/50ML IV SOLN
2.0000 g | Freq: Once | INTRAVENOUS | Status: AC
  Administered 2023-12-20: 2 g via INTRAVENOUS
  Filled 2023-12-20: qty 50

## 2023-12-20 MED ORDER — SODIUM CHLORIDE 0.9% FLUSH: 3.0000 mL | INTRAVENOUS | Status: DC | PRN

## 2023-12-20 NOTE — ED Provider Notes (Signed)
 [place] EMERGENCY DEPARTMENT AT [facility] Provider Note   CSN: [id number] Arrival date & time: 12/20/23  1724     History Chief Complaint  Patient presents with   Hypoglycemia    HPI: [name] is a 48 y.o. female with history pertinent for DiGeorge syndrome, ectrodactyly, GERD, HCL, HTN, T2DM, DKA who presents complaining of episode of altered mental status. Patient arrived via EMS from home.  History provided by patient and EMS.  No interpreter required during this encounter.  Patient reports that she had donuts and [employer] for lunch and thereafter took her normal insulin .  Per EMS, shortly thereafter patient's boyfriend found her unresponsive and diaphoretic.  EMS arrived, and initial blood glucose 27.  Patient was given 25 cc of D10 as well as amp of D50.  Patient initially with repeat blood glucose en route of 255, shortly thereafter blood glucose of 114 just prior to arrival to the ED.  Patient reports that she now feels back to baseline, denies any fever, chills, chest pain, shortness of breath.  Patient's recorded medical, surgical, social, medication list and allergies were reviewed in the Snapshot window as part of the initial history.   Prior to Admission medications   Medication Sig Start Date End Date Taking? Authorizing Provider  Accu-Chek Softclix Lancets lancets Use 3 (three) times daily as directed 08/08/21   Brien Belvie BRAVO, MD  ASPIRIN  LOW DOSE 81 MG EC tablet Take 1 tablet (81 mg total) by mouth daily. Patient taking differently: Take 81 mg by mouth at bedtime. 07/24/21   Brien Belvie BRAVO, MD  atorvastatin (LIPITOR) 40 MG tablet Take 40 mg by mouth at bedtime. 09/02/23   [provider]  Dulaglutide 1.5 MG/0.5ML SOAJ Inject 1.5 mg into the skin every Tuesday. 10/14/22   [provider]  ferrous sulfate  300 (60 Fe) MG/5ML syrup Take 5 mLs (300 mg total) by mouth daily. 06/28/22   Suellen Cantor A, PA-C  glucose blood (ACCU-CHEK  GUIDE) test strip Use 3 (three) times daily as instructed 07/03/21   Brien Belvie BRAVO, MD  HYDROcodone -acetaminophen  (NORCO/VICODIN) 5-325 MG tablet Take 1 tablet by mouth every 6 (six) hours as needed. 10/16/23   Prosperi, Christian H, PA-C  insulin  glargine (LANTUS ) 100 UNIT/ML Solostar Pen Inject 15 Units into the skin See admin instructions. Inject 60 units into the skin in the morning and 16 units at bedtime 09/28/23   Odell Celinda Balo, MD  metFORMIN  (GLUCOPHAGE ) 500 MG tablet Take 1 tablet (500 mg total) by mouth daily with breakfast. 09/28/23   Odell Celinda Balo, MD  omeprazole  (PRILOSEC) 20 MG capsule Take 1 capsule (20 mg total) by mouth daily. Patient taking differently: Take 20 mg by mouth daily before breakfast. 06/26/17   Mitchell, Jessica B, PA-C  ondansetron  (ZOFRAN -ODT) 4 MG disintegrating tablet Take 1 tablet (4 mg total) by mouth every 8 (eight) hours as needed for nausea or vomiting. 09/28/23   Odell Celinda Balo, MD  Vitamin D, Ergocalciferol, (DRISDOL) 1.25 MG (50000 UNIT) CAPS capsule Take 50,000 Units by mouth once a week. Patient not taking: Reported on 09/24/2023 06/11/23   [provider]     Allergies: Metformin    Review of Systems   ROS as per HPI  Physical Exam Updated Vital Signs BP 107/64   Pulse 91   Temp 97.9 F (36.6 C) (Oral)   Resp (!) 25   Ht 4' 9 (1.448 m)   Wt 39.4 kg   SpO2 100%   BMI  18.80 kg/m  Physical Exam Vitals and nursing note reviewed.  Constitutional:      General: She is not in acute distress.    Appearance: She is well-developed.  HENT:     Head: Normocephalic and atraumatic.  Eyes:     Conjunctiva/sclera: Conjunctivae normal.  Cardiovascular:     Rate and Rhythm: Normal rate and regular rhythm.     Heart sounds: No murmur heard. Pulmonary:     Effort: Pulmonary effort is normal. No respiratory distress.     Breath sounds: Normal breath sounds.  Abdominal:     Palpations: Abdomen is soft.     Tenderness: There  is no abdominal tenderness.  Musculoskeletal:        General: No swelling.     Cervical back: Neck supple.     Comments: ectrodactyly  Skin:    General: Skin is warm and dry.     Capillary Refill: Capillary refill takes less than 2 seconds.  Neurological:     Mental Status: She is alert.  Psychiatric:        Mood and Affect: Mood normal.     ED Course/ Medical Decision Making/ A&P    Procedures Procedures   Medications Ordered in ED Medications  sodium chloride  flush (NS) 0.9 % injection 3 mL (3 mLs Intravenous Given 12/20/23 2214)  sodium chloride  flush (NS) 0.9 % injection 3 mL (has no administration in time range)  0.9 %  sodium chloride  infusion (has no administration in time range)  dextrose  10 % infusion (has no administration in time range)  dextrose  (D10W) 10% bolus 250 mL (0 mLs Intravenous Stopped 12/20/23 1846)  magnesium  sulfate IVPB 2 g 50 mL (0 g Intravenous Stopped 12/20/23 2310)    Medical Decision Making:   [name] is a 48 y.o. female who presents for hypoglycemia and resolved altered mental status as per above.  Physical exam is pertinent for no focal abnormalities.   The differential includes but is not limited to hypoglycemia, insulin  overuse, persistent hyperglycemia, underlying electrolyte derangement, infection.  Independent historian: EMS  External data reviewed: Notes: Reviewed patient's medications, she uses Lantus  at baseline  Initial Plan:  Serial blood glucose monitoring Screening labs including CBC and Metabolic panel to evaluate for infectious or metabolic etiology of disease.  Urinalysis with reflex culture ordered to evaluate for UTI or relevant urologic/nephrologic pathology.   Labs: Ordered and Independent interpretation. Details: CBG 82 -> 50 -> 194 ->314, UA pending, Mag 1.6, serum HCG negative, CMP without AKI, anion gap elevation, emergent electrolyte derangement, CBC no leukocytosis, anemia, thrombocytopenia  Radiology:  Not indicated No results found.  EKG/Medicine tests: Ordered and Independent interpretation EKG Interpretation: Sinus rhythm Confirmed by Rogelia Satterfield ([id number]) on 12/20/2023 5:55:31 PM                Interventions: Snacks, D10 bolus  See the EMR for full details regarding lab and imaging results.  Patient arrives to emergency department, hemodynamically stable, well-appearing.  CBG on arrival had downtrended already to 82.  Patient provided snacks and orange juice, however on further repeat, patient with glucose further downtrending to 50.  Patient reports that she took her usual dosage of insulin  today.  Given snacks ineffectual, patient initiated on D10 bolus.  Patient did have improvement, however given patient is unable to fully describe etiology of hypoglycemia, cannot describe how much medication she takes, and has overall poor with healthcare literacy, do feel that patient would be benefited by admission for monitoring of  blood glucose.  Labs otherwise demonstrate hypomagnesemia which was repleted.  Medicine consulted, accepted by Dr. Alfornia, no additional acute events while patient was under my care.  Presentation is most consistent with acute complicated illness and Current presentation is complicated by underlying chronic conditions  Discussion of management or test interpretations with external provider(s): Medicine, Dr. Alfornia,  Risk Drugs:OTC drugs and Prescription drug management Treatment: Decision regarding hospitalization  Disposition: ADMIT: I believe the patient requires admission for further care and management. The patient was admitted to medicine. Please see inpatient provider note for additional treatment plan details.   MDM generated using voice dictation software and may contain dictation errors.  Please contact me for any clarification or with any questions.  Clinical Impression:  1. Hypoglycemia      Admit   Final Clinical Impression(s) / ED  Diagnoses Final diagnoses:  Hypoglycemia    Rx / DC Orders ED Discharge Orders     None        Rogelia Jerilynn RAMAN, MD 12/20/23 2337

## 2023-12-20 NOTE — H&P (Signed)
 History and Physical    [name] FMW:[id number] DOB: April 05, 1976 DOA: 12/20/2023  PCP: Tina Dover, PA-C  Patient coming from: Home  Chief Complaint: Hypoglycemia  HPI: [name] is a 48 y.o. female with medical history significant of type 2 diabetes on insulin , DiGeorge syndrome, microcephaly, intellectual disability, ectrodactyly, GERD, hyperlipidemia, hypertension, pyelonephritis complicated by left obstructing stone status post ureteral stent in June 2025 presents to the ED via EMS for evaluation of hypoglycemia.  Per EMS report, patient was at her boyfriend's house.  She ate and took her insulin .  She was then found unresponsive and diaphoretic.  CBG was 27 on EMS arrival and she was given 25 cc of D10 and amp of D50.  Patient initially with repeat glucose of 255 en route and shortly thereafter glucose was 114 just prior to arrival to the ED.  On arrival to the ED, glucose had trended down to 82.  She was provided with snacks and orange juice, however, on further repeat check glucose was 52.  She was given additional food/juice and 250 mL of D10 bolus after which CBG trended up to 392.  D10 drip was ordered but per ED RN it was never started.  Labs notable for hemoglobin 11.6 (previously 12.4 in July 2025), MCV 91.1, sodium 131 (previously 133 in July 2025), bicarb 19, creatinine 0.6, magnesium  1.6.  Patient was given IV mag 2 g.  TRH called to admit.  Patient is currently AAOx3 and answering questions appropriately.  States her doctor had previously stopped Trulicity and metformin .  She is currently only on insulin  for her diabetes.  States she is supposed to be taking Lantus  40 units in the morning and 16 units at night and Humalog 5 units 3 times a day with meals.  Patient states she was at her boyfriend's house today and had Humalog with her but forgot to bring her Lantus  so she ate a meal (bacon, eggs, peanut butter cup) and took Humalog 40 units instead of her usual  dose of 5 units.  She has no other complaints.  Denies fevers, cough, shortness of breath, chest pain, nausea, vomiting, abdominal pain, or diarrhea.  Review of Systems:  Review of Systems  All other systems reviewed and are negative.   Past Medical History:  Diagnosis Date   Diabetes mellitus without complication (HCC)    DiGeorge syndrome (HCC)    Ectrodactyly    Genetic defect    genetic mutation of unknown significance per [facility] genetic notes   GERD (gastroesophageal reflux disease)    Hypercholesterolemia    Hypertension    Intellectual disability    Microcephaly (HCC)    Pelvic mass    Ventral hernia     Past Surgical History:  Procedure Laterality Date   CHOLECYSTECTOMY     CYSTOSCOPY W/ URETERAL STENT PLACEMENT Left 09/24/2023   Procedure: CYSTOSCOPY, WITH RETROGRADE PYELOGRAM AND URETERAL STENT INSERTION;  Surgeon: Roseann Adine PARAS., MD;  Location: [facility] OR;  Service: Urology;  Laterality: Left;   HAND SURGERY Right    HERNIA REPAIR       reports that she has never smoked. She has never used smokeless tobacco. She reports that she does not drink alcohol and does not use drugs.  Allergies  Allergen Reactions   Metformin  Nausea Only    Family History  Problem Relation Age of Onset   Diabetes Mother    GER disease Mother    Diabetes Father     Prior to Admission medications  Medication Sig Start Date End Date Taking? Authorizing Provider  Accu-Chek Softclix Lancets lancets Use 3 (three) times daily as directed 08/08/21   Brien Belvie BRAVO, MD  ASPIRIN  LOW DOSE 81 MG EC tablet Take 1 tablet (81 mg total) by mouth daily. Patient taking differently: Take 81 mg by mouth at bedtime. 07/24/21   Brien Belvie BRAVO, MD  atorvastatin  (LIPITOR) 40 MG tablet Take 40 mg by mouth at bedtime. 09/02/23   [provider]  Dulaglutide 1.5 MG/0.5ML SOAJ Inject 1.5 mg into the skin every Tuesday. 10/14/22   [provider]  ferrous sulfate  300 (60 Fe) MG/5ML syrup  Take 5 mLs (300 mg total) by mouth daily. 06/28/22   Suellen Cantor A, PA-C  glucose blood (ACCU-CHEK GUIDE) test strip Use 3 (three) times daily as instructed 07/03/21   Brien Belvie BRAVO, MD  HYDROcodone -acetaminophen  (NORCO/VICODIN) 5-325 MG tablet Take 1 tablet by mouth every 6 (six) hours as needed. 10/16/23   Prosperi, Christian H, PA-C  insulin  glargine (LANTUS ) 100 UNIT/ML Solostar Pen Inject 15 Units into the skin See admin instructions. Inject 60 units into the skin in the morning and 16 units at bedtime 09/28/23   Odell Celinda Balo, MD  metFORMIN  (GLUCOPHAGE ) 500 MG tablet Take 1 tablet (500 mg total) by mouth daily with breakfast. 09/28/23   Odell Celinda Balo, MD  omeprazole  (PRILOSEC) 20 MG capsule Take 1 capsule (20 mg total) by mouth daily. Patient taking differently: Take 20 mg by mouth daily before breakfast. 06/26/17   Mitchell, Jessica B, PA-C  ondansetron  (ZOFRAN -ODT) 4 MG disintegrating tablet Take 1 tablet (4 mg total) by mouth every 8 (eight) hours as needed for nausea or vomiting. 09/28/23   Odell Celinda Balo, MD  Vitamin D, Ergocalciferol, (DRISDOL) 1.25 MG (50000 UNIT) CAPS capsule Take 50,000 Units by mouth once a week. Patient not taking: Reported on 09/24/2023 06/11/23   [provider]    Physical Exam: Vitals:   12/20/23 2130 12/20/23 2200 12/20/23 2214 12/20/23 2300  BP: 127/65 128/60  107/64  Pulse: 88 91  91  Resp: 12 12  (!) 25  Temp:   97.9 F (36.6 C)   TempSrc:   Oral   SpO2: 100% 100%  100%  Weight:      Height:        Physical Exam Vitals reviewed.  Constitutional:      General: She is not in acute distress. HENT:     Head: Normocephalic and atraumatic.  Eyes:     Extraocular Movements: Extraocular movements intact.  Cardiovascular:     Rate and Rhythm: Normal rate and regular rhythm.     Pulses: Normal pulses.  Pulmonary:     Effort: Pulmonary effort is normal. No respiratory distress.     Breath sounds: Normal breath sounds. No  stridor. No wheezing, rhonchi or rales.  Abdominal:     General: Bowel sounds are normal. There is no distension.     Palpations: Abdomen is soft.     Tenderness: There is no abdominal tenderness. There is no guarding.  Musculoskeletal:     Cervical back: Normal range of motion.     Right lower leg: No edema.     Left lower leg: No edema.  Skin:    General: Skin is warm and dry.  Neurological:     General: No focal deficit present.     Mental Status: She is alert and oriented to person, place, and time.     Labs on  Admission: I have personally reviewed following labs and imaging studies  CBC: Recent Labs  Lab 12/20/23 1742  WBC 5.6  NEUTROABS 4.2  HGB 11.6*  HCT 36.7  MCV 91.1  PLT 318   Basic Metabolic Panel: Recent Labs  Lab 12/20/23 1742  NA 131*  K 3.5  CL 99  CO2 19*  GLUCOSE 188*  BUN 16  CREATININE 0.66  CALCIUM  8.7*  MG 1.6*   GFR: Estimated Creatinine Clearance: 52.4 mL/min (by C-G formula based on SCr of 0.66 mg/dL). Liver Function Tests: Recent Labs  Lab 12/20/23 1742  AST 26  ALT 28  ALKPHOS 77  BILITOT 0.5  PROT 7.5  ALBUMIN 3.6   No results for input(s): LIPASE, AMYLASE in the last 168 hours. No results for input(s): AMMONIA in the last 168 hours. Coagulation Profile: No results for input(s): INR, PROTIME in the last 168 hours. Cardiac Enzymes: No results for input(s): CKTOTAL, CKMB, CKMBINDEX, TROPONINI in the last 168 hours. BNP (last 3 results) No results for input(s): PROBNP in the last 8760 hours. HbA1C: No results for input(s): HGBA1C in the last 72 hours. CBG: Recent Labs  Lab 12/20/23 1738 12/20/23 1808 12/20/23 1844 12/20/23 2137 12/20/23 2302  GLUCAP 82 52* 195* 314* 392*   Lipid Profile: No results for input(s): CHOL, HDL, LDLCALC, TRIG, CHOLHDL, LDLDIRECT in the last 72 hours. Thyroid Function Tests: No results for input(s): TSH, T4TOTAL, FREET4, T3FREE, THYROIDAB in  the last 72 hours. Anemia Panel: No results for input(s): VITAMINB12, FOLATE, FERRITIN, TIBC, IRON, RETICCTPCT in the last 72 hours. Urine analysis:    Component Value Date/Time   COLORURINE YELLOW 10/16/2023 1915   APPEARANCEUR TURBID (A) 10/16/2023 1915   LABSPEC 1.014 10/16/2023 1915   PHURINE 7.0 10/16/2023 1915   GLUCOSEU >=500 (A) 10/16/2023 1915   HGBUR MODERATE (A) 10/16/2023 1915   BILIRUBINUR NEGATIVE 10/16/2023 1915   KETONESUR 80 (A) 10/16/2023 1915   PROTEINUR 100 (A) 10/16/2023 1915   UROBILINOGEN 0.2 01/15/2007 2203   NITRITE NEGATIVE 10/16/2023 1915   LEUKOCYTESUR LARGE (A) 10/16/2023 1915    Radiological Exams on Admission: No results found.  EKG: Independently reviewed.  Sinus rhythm, no acute ischemic changes.  Assessment and Plan  Hypoglycemia in the setting of type 2 diabetes on insulin  Unintentional insulin  overdose A1c 8.5 on 10/17/2023.  Patient states she is currently only on insulin  for her diabetes.  She is no longer on Trulicity and metformin .  States she is supposed to be taking Lantus  40 units in the morning and 16 units at night and Humalog 5 units 3 times a day with meals.  Patient states she was at her boyfriend's house today and had Humalog with her but forgot to bring her Lantus  so she ate a meal (bacon, eggs, peanut butter cup) and took Humalog 40 units instead of her usual dose of 5 units.  On EMS arrival, patient was found unresponsive and diaphoretic with CBG of 27.  She was given 25 cc of D10 and an amp of D50 after which glucose improved to 255 en route and shortly thereafter glucose was 114 just prior to arrival to the ED.  On arrival to the ED, glucose had trended down to 82.  She was provided with snacks and orange juice, however, on further repeat check glucose was 52.  She was given additional food/juice and 250 mL of D10 bolus after which CBG trended up to 392.  Continue to hold home long-acting insulin  at this time.  Continue CBG  checks every 4 hours/sensitive sliding scale insulin  as needed.  Hypoglycemia protocol/dextrose  as needed.  Diabetes coordinator consulted as it will be difficult for patient to safely manage her diabetes due to having intellectual disability.  Repeat A1c ordered.  Hypomagnesemia Continue to monitor labs and replace as needed.  DiGeorge syndrome, microcephaly, intellectual disability Supportive care.  Hyperlipidemia Continue Lipitor.  Hypertension Not on antihypertensives at home and currently normotensive.  Borderline normocytic anemia Hemoglobin 11.6 (previously 12.4 in July 2025).  No signs of bleeding.  Monitor labs.  Chronic mild hyponatremia Sodium 131 (previously 133 in July 2025).  Continue to monitor labs.  Mild normal anion gap metabolic acidosis Renal function stable.  Continue to monitor metabolic panel.  GERD Continue PPI.  DVT prophylaxis: Lovenox  Code Status: Full Code (discussed with the patient) Family Communication: No family available at this time.  Diagnostic findings and treatment plan discussed with the patient. Level of care: Progressive Care Unit Admission status: It is my clinical opinion that referral for OBSERVATION is reasonable and necessary in this patient based on the above information provided. The aforementioned taken together are felt to place the patient at high risk for further clinical deterioration. However, it is anticipated that the patient may be medically stable for discharge from the hospital within 24 to 48 hours.  Editha Ram MD [facility]  If 7PM-7AM, please contact night-coverage [email or web address]  12/20/2023, 11:17 PM

## 2023-12-20 NOTE — ED Triage Notes (Addendum)
 Patient arrives via [facility] for hypoglycemia. Patient was at boyfriend's house, patient ate and took her insulin . Patient was found unresponsive, CBG 27, diaphoretic. 22 r forearm. 25G D10 given. Last cbg 114 10 min ago, initially 255 after amp of D50. EMS vitals: 144/78, HR 100, 99 room air. No hx of hypoglycemia.

## 2023-12-20 NOTE — Progress Notes (Signed)
  Subjective:  Patient ID: [name], female    DOB: 1975-07-05,  MRN: [id number]  [name] presents to clinic today for at risk foot care with history of diabetic neuropathy and painful mycotic toenails of both feet that are difficult to trim. Pain interferes with daily activities and wearing enclosed shoe gear comfortably.  Chief Complaint  Patient presents with   Nail Problem    Thick painful toenails, 4 month follow up    New problem(s): None.   PCP is Neita Dover, PA-C.  Allergies  Allergen Reactions   Metformin  Nausea Only    Review of Systems: Negative except as noted in the HPI.  Objective: No changes noted in today's physical examination. There were no vitals filed for this visit. [name] is a pleasant 48 y.o. female thin build in NAD. AAO x 3.  Vascular Examination: Vascular status intact b/l with faintly palpable pedal pulses. CFT immediate b/l. No edema. No pain with calf compression b/l. Skin temperature gradient WNL b/l. No ischemia or gangrene noted b/l LE. No cyanosis or clubbing noted b/l LE.  Neurological Examination: Sensation grossly intact b/l with 10 gram monofilament. Vibratory sensation intact b/l. Pt has subjective symptoms of neuropathy.  Dermatological Examination: Pedal skin with normal turgor, texture and tone b/l. Toenails 1-5 b/l thick, discolored, elongated with subungual debris and pain on dorsal palpation. No hyperkeratotic lesions noted b/l.   Musculoskeletal Examination: Muscle strength 5/5 to b/l LE. HAV with bunion bilaterally and hammertoes 2-5 b/l.  Radiographs: None  Assessment/Plan: 1. Pain due to onychomycosis of toenails of both feet   2. Diabetic peripheral neuropathy associated with type 2 diabetes mellitus (HCC)   Patient was evaluated and treated. All patient's and/or POA's questions/concerns addressed on today's visit. Mycotic toenails 1-5 debrided in length and girth without incident.   Continue daily foot inspections and monitor blood glucose per PCP/Endocrinologist's recommendations.Continue soft, supportive shoe gear daily. Report any pedal injuries to medical professional. Call office if there are any quesitons/concerns.  Return in about 3 months (around 03/15/2024).  Delon LITTIE Merlin, DPM      [place] LOCATION: [address] [place] [address]                   Office [phone]   [facility] LOCATION: [address] [place], [place] [address] Office [phone]

## 2023-12-20 NOTE — ED Notes (Signed)
 Patient provided juice and food. MD notified of CBG, RN to recheck in 15 min

## 2023-12-21 DIAGNOSIS — D821 Di George's syndrome: Secondary | ICD-10-CM

## 2023-12-21 DIAGNOSIS — E162 Hypoglycemia, unspecified: Secondary | ICD-10-CM | POA: Diagnosis not present

## 2023-12-21 LAB — CBC
HCT: 32.4 % — ABNORMAL LOW (ref 36.0–46.0)
Hemoglobin: 10.5 g/dL — ABNORMAL LOW (ref 12.0–15.0)
MCH: 29.2 pg (ref 26.0–34.0)
MCHC: 32.4 g/dL (ref 30.0–36.0)
MCV: 90 fL (ref 80.0–100.0)
Platelets: 298 K/uL (ref 150–400)
RBC: 3.6 MIL/uL — ABNORMAL LOW (ref 3.87–5.11)
RDW: 13.2 % (ref 11.5–15.5)
WBC: 6.4 K/uL (ref 4.0–10.5)
nRBC: 0 % (ref 0.0–0.2)

## 2023-12-21 LAB — GLUCOSE, CAPILLARY
Glucose-Capillary: 389 mg/dL — ABNORMAL HIGH (ref 70–99)
Glucose-Capillary: 450 mg/dL — ABNORMAL HIGH (ref 70–99)
Glucose-Capillary: 306 mg/dL — ABNORMAL HIGH (ref 70–99)
Glucose-Capillary: 296 mg/dL — ABNORMAL HIGH (ref 70–99)
Glucose-Capillary: 331 mg/dL — ABNORMAL HIGH (ref 70–99)

## 2023-12-21 LAB — CBG MONITORING, ED
Glucose-Capillary: 469 mg/dL — ABNORMAL HIGH (ref 70–99)
Glucose-Capillary: 481 mg/dL — ABNORMAL HIGH (ref 70–99)
Glucose-Capillary: 300 mg/dL — ABNORMAL HIGH (ref 70–99)
Glucose-Capillary: 195 mg/dL — ABNORMAL HIGH (ref 70–99)

## 2023-12-21 LAB — BASIC METABOLIC PANEL WITH GFR
Anion gap: 6 (ref 5–15)
BUN: 21 mg/dL — ABNORMAL HIGH (ref 6–20)
CO2: 25 mmol/L (ref 22–32)
Calcium: 8.8 mg/dL — ABNORMAL LOW (ref 8.9–10.3)
Chloride: 98 mmol/L (ref 98–111)
Creatinine, Ser: 0.93 mg/dL (ref 0.44–1.00)
GFR, Estimated: >60 mL/min (ref >60)
Glucose, Bld: 471 mg/dL — ABNORMAL HIGH (ref 70–99)
Potassium: 4.3 mmol/L (ref 3.5–5.1)
Sodium: 129 mmol/L — ABNORMAL LOW (ref 135–145)

## 2023-12-21 LAB — HEMOGLOBIN A1C
Hgb A1c MFr Bld: 7.7 % — ABNORMAL HIGH (ref 4.8–5.6)
Mean Plasma Glucose: 174.29 mg/dL

## 2023-12-21 LAB — MAGNESIUM: Magnesium: 2.1 mg/dL (ref 1.7–2.4)

## 2023-12-21 MED ORDER — ACETAMINOPHEN 650 MG RE SUPP: 650.0000 mg | Freq: Four times a day (QID) | RECTAL | Status: DC | PRN

## 2023-12-21 MED ORDER — INSULIN ASPART 100 UNIT/ML IJ SOLN
10.0000 [IU] | Freq: Once | INTRAMUSCULAR | Status: AC
  Administered 2023-12-21: 10 [IU] via SUBCUTANEOUS

## 2023-12-21 MED ORDER — INSULIN ASPART 100 UNIT/ML IJ SOLN
5.0000 [IU] | Freq: Once | INTRAMUSCULAR | Status: AC
  Administered 2023-12-21: 5 [IU] via SUBCUTANEOUS

## 2023-12-21 MED ORDER — DEXTROSE 50 % IV SOLN: 12.5000 g | INTRAVENOUS | Status: DC | PRN

## 2023-12-21 MED ORDER — PANTOPRAZOLE SODIUM 40 MG PO TBEC
40.0000 mg | DELAYED_RELEASE_TABLET | Freq: Every day | ORAL | Status: DC
Start: 2023-12-21 — End: 2023-12-23
  Administered 2023-12-21 – 2023-12-23 (×3): 40 mg via ORAL
  Filled 2023-12-21 (×3): qty 1

## 2023-12-21 MED ORDER — ACETAMINOPHEN 325 MG PO TABS: 650.0000 mg | ORAL_TABLET | Freq: Four times a day (QID) | ORAL | Status: DC | PRN

## 2023-12-21 MED ORDER — INSULIN ASPART 100 UNIT/ML IJ SOLN
0.0000 [IU] | INTRAMUSCULAR | Status: DC
  Administered 2023-12-21: 5 [IU] via SUBCUTANEOUS
  Administered 2023-12-21: 7 [IU] via SUBCUTANEOUS
  Administered 2023-12-22: 9 [IU] via SUBCUTANEOUS

## 2023-12-21 MED ORDER — ATORVASTATIN CALCIUM 40 MG PO TABS
40.0000 mg | ORAL_TABLET | Freq: Every day | ORAL | Status: DC
  Administered 2023-12-21 – 2023-12-22 (×3): 40 mg via ORAL
  Filled 2023-12-21 (×3): qty 1

## 2023-12-21 MED ORDER — ENOXAPARIN SODIUM 30 MG/0.3ML IJ SOSY
30.0000 mg | PREFILLED_SYRINGE | INTRAMUSCULAR | Status: DC
  Administered 2023-12-21 – 2023-12-23 (×3): 30 mg via SUBCUTANEOUS
  Filled 2023-12-21 (×3): qty 0.3

## 2023-12-21 MED ORDER — ASPIRIN 81 MG PO TBEC
81.0000 mg | DELAYED_RELEASE_TABLET | Freq: Every day | ORAL | Status: DC
  Administered 2023-12-21 – 2023-12-22 (×3): 81 mg via ORAL
  Filled 2023-12-21 (×3): qty 1

## 2023-12-21 MED ORDER — INSULIN ASPART 100 UNIT/ML IJ SOLN
5.0000 [IU] | Freq: Once | INTRAMUSCULAR | Status: AC
Start: 2023-12-21 — End: 2023-12-21
  Administered 2023-12-21: 5 [IU] via SUBCUTANEOUS

## 2023-12-21 MED ORDER — INSULIN GLARGINE 100 UNIT/ML ~~LOC~~ SOLN
10.0000 [IU] | Freq: Two times a day (BID) | SUBCUTANEOUS | Status: DC
Start: 2023-12-21 — End: 2023-12-22
  Administered 2023-12-21: 10 [IU] via SUBCUTANEOUS
  Filled 2023-12-21 (×3): qty 0.1

## 2023-12-21 MED ORDER — INSULIN GLARGINE 100 UNIT/ML ~~LOC~~ SOLN
10.0000 [IU] | Freq: Every day | SUBCUTANEOUS | Status: DC
  Administered 2023-12-21: 10 [IU] via SUBCUTANEOUS
  Filled 2023-12-21: qty 0.1

## 2023-12-21 NOTE — Plan of Care (Signed)
  Problem: Nutritional: Goal: Maintenance of adequate nutrition will improve Outcome: Progressing   Problem: Health Behavior/Discharge Planning: Goal: Ability to manage health-related needs will improve Outcome: Progressing   Problem: Clinical Measurements: Goal: Diagnostic test results will improve Outcome: Progressing

## 2023-12-21 NOTE — ED Notes (Signed)
 IP RN and admitting provider made aware of patient's elevated CBG level.

## 2023-12-21 NOTE — TOC Initial Note (Signed)
 Transition of Care [facility]) - Initial/Assessment Note    Patient Details  Name: [name] MRN: [id number] Date of Birth: 03/13/1976  [facility] of [facility]) CM/SW Contact:    Nola Devere Hands, RN Phone Number: 12/21/2023, 2:47 PM  Clinical Narrative:                 Patient is a 48 yr old female, from home, adm with hypoglycemia. Has Hx of type 2 diabetes,on insulin . IPCM will continue to follow.         Patient Goals and CMS Choice            Expected Discharge Plan and Services                                              Prior Living Arrangements/Services                       Activities of Daily Living      Permission Sought/Granted                  Emotional Assessment              Admission diagnosis:  Hypoglycemia [E16.2] Patient Active Problem List   Diagnosis Date Noted   Hypomagnesemia 12/21/2023   DiGeorge syndrome (HCC) 12/21/2023   Hypoglycemia 12/20/2023   Hyperlipidemia 09/25/2023   Hydronephrosis of left kidney 09/25/2023   Pyelonephritis 09/24/2023   Menorrhagia with regular cycle 04/14/2023   Hypoglycemia due to insulin  10/22/2022   Hypotension due to hypovolemia 12/17/2021   Acute lower UTI 12/17/2021   AKI (acute kidney injury) (HCC) 12/17/2021   Type 2 diabetes mellitus (HCC) 12/17/2021   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HCC) 12/17/2021   Chest pain 09/28/2020   GERD (gastroesophageal reflux disease) 10/20/2019   Intellectual disability 03/09/2018   Microcephaly (HCC) 02/19/2017   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   Incarcerated ventral hernia 02/10/2017   DM (diabetes mellitus) type 2, uncontrolled, with ketoacidosis (HCC) 02/10/2017   Protein-calorie malnutrition, severe (HCC) 02/10/2017   Type 2 diabetes mellitus with hyperglycemia, with long-term current use of insulin  (HCC) 02/10/2017   H/O pelvic mass    DKA (diabetic ketoacidosis) (HCC) 02/09/2017   Hyponatremia 02/09/2017    Hyperkalemia 02/09/2017   Homelessness 02/09/2017   Giant ovarian mass    PCP:  Neita Dover, PA-C Pharmacy:   [id number] - [place], [place] - [address] AT [facility] [address] [place] [address] Phone: [phone] Fax: [phone]  [id number] [place], [place] - [address] [facility] [address] [place] [address] Phone: [phone] Fax: [phone]  [facility] [address] [place] [address] Phone: [phone] Fax: [phone]     Social Drivers of Health (SDOH) Social History: SDOH Screenings   Food Insecurity: Unknown (10/18/2023)   Received from [facility]  Utilities: Unknown (10/18/2023)   Received from [facility]  Depression (PHQ2-9): Low Risk  (09/30/2018)  Tobacco Use: Low Risk  (12/15/2023)   SDOH Interventions:     Readmission Risk Interventions    09/25/2023    2:09 PM  Readmission Risk Prevention Plan  Post Dischage Appt Complete  Medication Screening Complete  Transportation Screening Complete

## 2023-12-21 NOTE — Inpatient Diabetes Management (Signed)
 Inpatient Diabetes Program Recommendations  AACE/ADA: New Consensus Statement on Inpatient Glycemic Control (2015)  Target Ranges:  Prepandial:   less than 140 mg/dL      Peak postprandial:   less than 180 mg/dL (1-2 hours)      Critically ill patients:  140 - 180 mg/dL   Lab Results  Component Value Date   GLUCAP 389 (H) 12/21/2023   HGBA1C 7.7 (H) 12/21/2023    Review of Glycemic Control  Diabetes history: type 2 Outpatient Diabetes medications: Lantus  40 units daily, Humalog 5 units TID, Metformin  500 mg daily Current orders for Inpatient glycemic control: Lantus  10 units daily, Novolog  0-9 units correction scale every 4 hours.  Inpatient Diabetes Program Recommendations:   Spoke to patient at the bedside. States that she was diagnosed with diabetes in 2007. Has been on insulin  most recently. States that she was taking Lantus  40 units daily, but was only taking the Humalog since her last hospitalization. The more we talked the more confused she got with what she was taking at home. 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 States that she lives alone; a nurse checks on her every Tuesday. Her family lives out of town; has a brother that checks on her from time to time.  Spoke to [employer]  RN about having patient give herself injections and see how she does drawing up in a syringe. Patient states that she gives herself injections at home.  Patient did not seem to be aware of why her blood sugar was low on admission.   Marjorie Lunger RN BSN CDE Diabetes Coordinator Pager: [phone]  8am-5pm

## 2023-12-21 NOTE — ED Notes (Signed)
 CCMD called to report transfer to RM [phone].

## 2023-12-21 NOTE — Progress Notes (Signed)
 PROGRESS NOTE  [name] FMW:[id number] DOB: 12/26/1975 DOA: 12/20/2023 PCP: Neita Dover, PA-C  HPI/Recap of past 24 hours: [name] is a 48 y.o. female with medical history significant of type 2 diabetes on insulin , DiGeorge syndrome, microcephaly, intellectual disability, ectrodactyly, GERD, hyperlipidemia, hypertension, pyelonephritis complicated by left obstructing stone status post ureteral stent in June 2025 presents to the ED via EMS for evaluation of hypoglycemia.  Per EMS report, patient was at her boyfriend's house.  She ate and took her insulin .  She was then found unresponsive and diaphoretic.  CBG was 27 on EMS arrival and she was given 25 cc of D10 and amp of D50.  Patient continued to have intermittent hypoglycemia in the ED, although remained alert throughout.  Of note, patient stated her doctor had previously stopped Trulicity and metformin .  She is currently only on insulin  for her diabetes.  States she is supposed to be taking Lantus  40 units in the morning and 16 units at night and Humalog 5 units 3 times a day with meals.  Patient states she was at her boyfriend's house PTA and had Humalog with her but forgot to bring her Lantus  so she ate a meal (bacon, eggs, peanut butter cup) and took Humalog 40 units instead of her usual dose of 5 units.  Patient admitted for further management.     Today, patient denies any new complaints, requesting for food.  Often gets confused when asked about her insulin  regimen (noted intellectual disability).  Does have multiple fingers missing on both hands.  Unsure if she can accurately administer insulin  safely.  Insulin  may not be the best/safest form of regimen for her diabetes.  There is also some confusion about what patient should be taking.  Diabetes coordinator on board.    Assessment/Plan: Principal Problem:   Hypoglycemia Active Problems:   Type 2 diabetes mellitus (HCC)   Hyperlipidemia   Hypomagnesemia    DiGeorge syndrome (HCC)   Hypoglycemia in the setting of type 2 diabetes on insulin , uncontrolled with hypoglycemia now hyperglycemia Unintentional insulin  overdose A1c 7.7 Continue SSI, Semglee , (adjust accordingly)Accu-Cheks, hypoglycemic protocol Home regimen includes: Lantus  40 units in the morning and 16 units at night and Humalog 5 units 3 times a day with meals Trulicity and metformin  were discontinued Diabetes coordinator consulted as it will be difficult for patient to safely manage her diabetes due to having intellectual disability   Pseudohyponatremia Likely from hyperglycemia Daily BMP   DiGeorge syndrome, microcephaly, intellectual disability Lives alone Supportive care   Hyperlipidemia Continue Lipitor   Hypertension Not on antihypertensives at home and currently normotensive.   Borderline normocytic anemia Hemoglobin 12.4 in July 2025 Daily CBC   GERD Continue PPI.    Estimated body mass index is 18.8 kg/m as calculated from the following:   Height as of this encounter: 4' 9 (1.448 m).   Weight as of this encounter: 39.4 kg.     Code Status: Full  Family Communication: None at bedside  Disposition Plan: Status is: Observation The patient remains OBS appropriate and will d/c before 2 midnights.      Consultants: None  Procedures: None  Antimicrobials: None  DVT prophylaxis: Lovenox    Objective: Vitals:   12/21/23 0915 12/21/23 1000 12/21/23 1100 12/21/23 1300  BP: (!) 118/53 118/76 129/68 113/65  Pulse: 89 93 88 88  Resp: 15 18 12 15   Temp:    98.2 F (36.8 C)  TempSrc:    Oral  SpO2: 100% 100% 100%  Weight:      Height:        Intake/Output Summary (Last 24 hours) at 12/21/2023 1501 Last data filed at 12/20/2023 2310 Gross per 24 hour  Intake 44.6 ml  Output --  Net 44.6 ml   Filed Weights   12/20/23 1733  Weight: 39.4 kg    Exam: General: NAD  Cardiovascular: S1, S2 present Respiratory: CTAB Abdomen: Soft,  nontender, nondistended, bowel sounds present Musculoskeletal: No bilateral pedal edema noted, multiple fingers missing on both hands Skin: Normal Psychiatry: Normal mood     Data Reviewed: CBC: Recent Labs  Lab 12/20/23 1742 12/21/23 0457  WBC 5.6 6.4  NEUTROABS 4.2  --   HGB 11.6* 10.5*  HCT 36.7 32.4*  MCV 91.1 90.0  PLT 318 298   Basic Metabolic Panel: Recent Labs  Lab 12/20/23 1742 12/21/23 0457  NA 131* 129*  K 3.5 4.3  CL 99 98  CO2 19* 25  GLUCOSE 188* 471*  BUN 16 21*  CREATININE 0.66 0.93  CALCIUM  8.7* 8.8*  MG 1.6* 2.1   GFR: Estimated Creatinine Clearance: 45.1 mL/min (by C-G formula based on SCr of 0.93 mg/dL). Liver Function Tests: Recent Labs  Lab 12/20/23 1742  AST 26  ALT 28  ALKPHOS 77  BILITOT 0.5  PROT 7.5  ALBUMIN 3.6   No results for input(s): LIPASE, AMYLASE in the last 168 hours. No results for input(s): AMMONIA in the last 168 hours. Coagulation Profile: No results for input(s): INR, PROTIME in the last 168 hours. Cardiac Enzymes: No results for input(s): CKTOTAL, CKMB, CKMBINDEX, TROPONINI in the last 168 hours. BNP (last 3 results) No results for input(s): PROBNP in the last 8760 hours. HbA1C: Recent Labs    12/21/23 0457  HGBA1C 7.7*   CBG: Recent Labs  Lab 12/21/23 0330 12/21/23 0725 12/21/23 0856 12/21/23 1241 12/21/23 1256  GLUCAP 481* 300* 195* 450* 389*   Lipid Profile: No results for input(s): CHOL, HDL, LDLCALC, TRIG, CHOLHDL, LDLDIRECT in the last 72 hours. Thyroid Function Tests: No results for input(s): TSH, T4TOTAL, FREET4, T3FREE, THYROIDAB in the last 72 hours. Anemia Panel: No results for input(s): VITAMINB12, FOLATE, FERRITIN, TIBC, IRON, RETICCTPCT in the last 72 hours. Urine analysis:    Component Value Date/Time   COLORURINE YELLOW 10/16/2023 1915   APPEARANCEUR TURBID (A) 10/16/2023 1915   LABSPEC 1.014 10/16/2023 1915   PHURINE 7.0  10/16/2023 1915   GLUCOSEU >=500 (A) 10/16/2023 1915   HGBUR MODERATE (A) 10/16/2023 1915   BILIRUBINUR NEGATIVE 10/16/2023 1915   KETONESUR 80 (A) 10/16/2023 1915   PROTEINUR 100 (A) 10/16/2023 1915   UROBILINOGEN 0.2 01/15/2007 2203   NITRITE NEGATIVE 10/16/2023 1915   LEUKOCYTESUR LARGE (A) 10/16/2023 1915   Sepsis Labs: @LABRCNTIP (procalcitonin:4,lacticidven:4)  )No results found for this or any previous visit (from the past 240 hours).    Studies: No results found.  Scheduled Meds:  aspirin  EC  81 mg Oral QHS   atorvastatin   40 mg Oral QHS   enoxaparin  (LOVENOX ) injection  30 mg Subcutaneous Q24H   insulin  aspart  0-9 Units Subcutaneous Q4H   insulin  glargine  10 Units Subcutaneous BID   pantoprazole   40 mg Oral Daily   sodium chloride  flush  3 mL Intravenous Q12H    Continuous Infusions:  sodium chloride        LOS: 0 days     Lebron JINNY Cage, MD [facility]  If 7PM-7AM, please contact night-coverage [email or web address] 12/21/2023, 3:01 PM

## 2023-12-21 NOTE — ED Notes (Signed)
 5W secretary aware patient is on the way upstairs.

## 2023-12-22 ENCOUNTER — Other Ambulatory Visit (HOSPITAL_COMMUNITY): Payer: Self-pay

## 2023-12-22 ENCOUNTER — Telehealth (HOSPITAL_COMMUNITY): Payer: Self-pay | Admitting: Pharmacy Technician

## 2023-12-22 DIAGNOSIS — E1110 Type 2 diabetes mellitus with ketoacidosis without coma: Secondary | ICD-10-CM | POA: Diagnosis not present

## 2023-12-22 DIAGNOSIS — E785 Hyperlipidemia, unspecified: Secondary | ICD-10-CM

## 2023-12-22 DIAGNOSIS — Z794 Long term (current) use of insulin: Secondary | ICD-10-CM

## 2023-12-22 DIAGNOSIS — D821 Di George's syndrome: Secondary | ICD-10-CM | POA: Diagnosis present

## 2023-12-22 DIAGNOSIS — T383X1A Poisoning by insulin and oral hypoglycemic [antidiabetic] drugs, accidental (unintentional), initial encounter: Secondary | ICD-10-CM | POA: Diagnosis present

## 2023-12-22 DIAGNOSIS — Z87440 Personal history of urinary (tract) infections: Secondary | ICD-10-CM | POA: Diagnosis not present

## 2023-12-22 DIAGNOSIS — E162 Hypoglycemia, unspecified: Secondary | ICD-10-CM | POA: Diagnosis present

## 2023-12-22 DIAGNOSIS — Z7982 Long term (current) use of aspirin: Secondary | ICD-10-CM | POA: Diagnosis not present

## 2023-12-22 DIAGNOSIS — E11649 Type 2 diabetes mellitus with hypoglycemia without coma: Secondary | ICD-10-CM | POA: Diagnosis present

## 2023-12-22 DIAGNOSIS — E872 Acidosis, unspecified: Secondary | ICD-10-CM | POA: Diagnosis present

## 2023-12-22 DIAGNOSIS — E871 Hypo-osmolality and hyponatremia: Secondary | ICD-10-CM | POA: Diagnosis present

## 2023-12-22 DIAGNOSIS — F79 Unspecified intellectual disabilities: Secondary | ICD-10-CM | POA: Diagnosis present

## 2023-12-22 DIAGNOSIS — Q02 Microcephaly: Secondary | ICD-10-CM | POA: Diagnosis not present

## 2023-12-22 DIAGNOSIS — K219 Gastro-esophageal reflux disease without esophagitis: Secondary | ICD-10-CM | POA: Diagnosis present

## 2023-12-22 DIAGNOSIS — E1169 Type 2 diabetes mellitus with other specified complication: Secondary | ICD-10-CM

## 2023-12-22 DIAGNOSIS — Z888 Allergy status to other drugs, medicaments and biological substances status: Secondary | ICD-10-CM | POA: Diagnosis not present

## 2023-12-22 DIAGNOSIS — E1165 Type 2 diabetes mellitus with hyperglycemia: Secondary | ICD-10-CM | POA: Diagnosis not present

## 2023-12-22 DIAGNOSIS — Z79899 Other long term (current) drug therapy: Secondary | ICD-10-CM | POA: Diagnosis not present

## 2023-12-22 DIAGNOSIS — Z9049 Acquired absence of other specified parts of digestive tract: Secondary | ICD-10-CM | POA: Diagnosis not present

## 2023-12-22 DIAGNOSIS — I10 Essential (primary) hypertension: Secondary | ICD-10-CM | POA: Diagnosis present

## 2023-12-22 DIAGNOSIS — E7800 Pure hypercholesterolemia, unspecified: Secondary | ICD-10-CM | POA: Diagnosis present

## 2023-12-22 DIAGNOSIS — Z7984 Long term (current) use of oral hypoglycemic drugs: Secondary | ICD-10-CM | POA: Diagnosis not present

## 2023-12-22 DIAGNOSIS — Z833 Family history of diabetes mellitus: Secondary | ICD-10-CM | POA: Diagnosis not present

## 2023-12-22 DIAGNOSIS — Z7985 Long-term (current) use of injectable non-insulin antidiabetic drugs: Secondary | ICD-10-CM | POA: Diagnosis not present

## 2023-12-22 LAB — CBC WITH DIFFERENTIAL/PLATELET
Abs Immature Granulocytes: 0.01 K/uL (ref 0.00–0.07)
Basophils Absolute: 0 K/uL (ref 0.0–0.1)
Basophils Relative: 1 %
Eosinophils Absolute: 0.2 K/uL (ref 0.0–0.5)
Eosinophils Relative: 3 %
HCT: 33.7 % — ABNORMAL LOW (ref 36.0–46.0)
Hemoglobin: 11 g/dL — ABNORMAL LOW (ref 12.0–15.0)
Immature Granulocytes: 0 %
Lymphocytes Relative: 40 %
Lymphs Abs: 2.5 K/uL (ref 0.7–4.0)
MCH: 28.9 pg (ref 26.0–34.0)
MCHC: 32.6 g/dL (ref 30.0–36.0)
MCV: 88.5 fL (ref 80.0–100.0)
Monocytes Absolute: 0.5 K/uL (ref 0.1–1.0)
Monocytes Relative: 7 %
Neutro Abs: 3.1 K/uL (ref 1.7–7.7)
Neutrophils Relative %: 49 %
Platelets: 320 K/uL (ref 150–400)
RBC: 3.81 MIL/uL — ABNORMAL LOW (ref 3.87–5.11)
RDW: 13.3 % (ref 11.5–15.5)
WBC: 6.3 K/uL (ref 4.0–10.5)
nRBC: 0 % (ref 0.0–0.2)

## 2023-12-22 LAB — GLUCOSE, CAPILLARY
Glucose-Capillary: 111 mg/dL — ABNORMAL HIGH (ref 70–99)
Glucose-Capillary: 145 mg/dL — ABNORMAL HIGH (ref 70–99)
Glucose-Capillary: 195 mg/dL — ABNORMAL HIGH (ref 70–99)
Glucose-Capillary: 204 mg/dL — ABNORMAL HIGH (ref 70–99)
Glucose-Capillary: 261 mg/dL — ABNORMAL HIGH (ref 70–99)
Glucose-Capillary: 293 mg/dL — ABNORMAL HIGH (ref 70–99)
Glucose-Capillary: 293 mg/dL — ABNORMAL HIGH (ref 70–99)
Glucose-Capillary: 385 mg/dL — ABNORMAL HIGH (ref 70–99)
Glucose-Capillary: 39 mg/dL — CL (ref 70–99)
Glucose-Capillary: 414 mg/dL — ABNORMAL HIGH (ref 70–99)

## 2023-12-22 LAB — BASIC METABOLIC PANEL WITH GFR
Anion gap: 6 (ref 5–15)
BUN: 19 mg/dL (ref 6–20)
CO2: 23 mmol/L (ref 22–32)
Calcium: 8.7 mg/dL — ABNORMAL LOW (ref 8.9–10.3)
Chloride: 106 mmol/L (ref 98–111)
Creatinine, Ser: 0.72 mg/dL (ref 0.44–1.00)
GFR, Estimated: >60 mL/min (ref >60)
Glucose, Bld: 94 mg/dL (ref 70–99)
Potassium: 4.1 mmol/L (ref 3.5–5.1)
Sodium: 135 mmol/L (ref 135–145)

## 2023-12-22 MED ORDER — LINAGLIPTIN 5 MG PO TABS
5.0000 mg | ORAL_TABLET | Freq: Every day | ORAL | Status: DC
  Administered 2023-12-22: 5 mg via ORAL
  Filled 2023-12-22 (×2): qty 1

## 2023-12-22 MED ORDER — INSULIN ASPART 100 UNIT/ML IJ SOLN
8.0000 [IU] | INTRAMUSCULAR | Status: DC
Start: 2023-12-22 — End: 2023-12-22

## 2023-12-22 MED ORDER — INSULIN ASPART 100 UNIT/ML IJ SOLN
0.0000 [IU] | Freq: Three times a day (TID) | INTRAMUSCULAR | Status: DC
  Administered 2023-12-22: 5 [IU] via SUBCUTANEOUS
  Administered 2023-12-22: 3 [IU] via SUBCUTANEOUS
  Administered 2023-12-22: 2 [IU] via SUBCUTANEOUS
  Administered 2023-12-23: 5 [IU] via SUBCUTANEOUS

## 2023-12-22 MED ORDER — METFORMIN HCL 500 MG PO TABS
500.0000 mg | ORAL_TABLET | Freq: Every day | ORAL | Status: DC
Start: 2023-12-23 — End: 2023-12-23

## 2023-12-22 MED ORDER — INSULIN GLARGINE 100 UNIT/ML ~~LOC~~ SOLN
15.0000 [IU] | Freq: Every day | SUBCUTANEOUS | Status: DC
  Administered 2023-12-22: 15 [IU] via SUBCUTANEOUS
  Filled 2023-12-22 (×2): qty 0.15

## 2023-12-22 MED ORDER — LACTATED RINGERS IV BOLUS
500.0000 mL | Freq: Once | INTRAVENOUS | Status: DC
Start: 2023-12-22 — End: 2023-12-22

## 2023-12-22 NOTE — Progress Notes (Signed)
 Primary RN was informed that patient's CBG was 39. Hypoglycemic protocol was followed and patient was provided 6 oz of apple juice. CBG was rechecked per protocol and had increased to 111.

## 2023-12-22 NOTE — Telephone Encounter (Signed)
 Pharmacy Patient Advocate Encounter  Received notification from [facility] MEDICAID that Prior Authorization for FreeStyle Libre 3 Plus Sensor  has been APPROVED from 12/22/2023 to 06/19/2024. Ran test claim, Copay is $0.00. This test claim was processed through [facility]- copay amounts may vary at other pharmacies due to pharmacy/plan contracts, or as the patient moves through the different stages of their insurance plan.   PA #/Case ID/Reference #: [id number]

## 2023-12-22 NOTE — Plan of Care (Signed)
  Problem: Nutritional: Goal: Maintenance of adequate nutrition will improve Outcome: Progressing   Problem: Clinical Measurements: Goal: Diagnostic test results will improve Outcome: Progressing

## 2023-12-22 NOTE — Progress Notes (Addendum)
 PROGRESS NOTE        PATIENT DETAILS Name: [name] Age: 48 y.o. Sex: female Date of Birth: 1975-05-13 Admit Date: 12/20/2023 Admitting Physician Editha Ram, MD ERE:Tjuuzmdnw, Belvie, PA-C  Brief Summary: Patient is a 48 y.o.  female with history of DM-2, DiGeorge syndrome, microcephaly, intellectual disability ectrodactyly who presented to the hospital with hypoglycemia secondary to accidental administration of 40 units of NovoLog  thought she was taking 40 units of Lantus )  Significant events: 9/14>> admit to [facility]  Significant studies: None  Significant microbiology data: None  Procedures: None  Consults: None  Subjective: Lying comfortably in bed-denies any chest pain or shortness of breath.  Another episode of hypoglycemia earlier this morning.  Objective: Vitals: Blood pressure (!) 115/53, pulse 81, temperature 97.9 F (36.6 C), temperature source Oral, resp. rate 12, height 4' 9 (1.448 m), weight 39.4 kg, SpO2 99%.   Exam: Gen Exam:Alert awake-not in any distress HEENT:atraumatic, normocephalic Chest: B/L clear to auscultation anteriorly CVS:S1S2 regular Abdomen:soft non tender, non distended Extremities:no edema Neurology: Non focal Skin: no rash  Pertinent Labs/Radiology:    Latest Ref Rng & Units 12/22/2023    4:35 AM 12/21/2023    4:57 AM 12/20/2023    5:42 PM  CBC  WBC 4.0 - 10.5 K/uL 6.3  6.4  5.6   Hemoglobin 12.0 - 15.0 g/dL 88.9  89.4  88.3   Hematocrit 36.0 - 46.0 % 33.7  32.4  36.7   Platelets 150 - 400 K/uL 320  298  318     Lab Results  Component Value Date   NA 135 12/22/2023   K 4.1 12/22/2023   CL 106 12/22/2023   CO2 23 12/22/2023      Assessment/Plan: Hypoglycemia Secondary to accidental/unintentional use of 40 units of Humalog (patient thought she was taking 40 units of Lantus ) Another episode of hypoglycemia earlier this morning Plan is to monitor another 24 hours-ensure CBG stable  before consideration of discharge-given her intellectual disability issues/lives alone. See discussion regarding DM-2 below.   Recent Labs    12/22/23 0404 12/22/23 0436 12/22/23 0806  GLUCAP 39* 111* 195*     DM-2 (A1c 7.7 on 9/15) Home regimen includes Lantus  twice a day along with Humalog-previously was on metformin  but this was discontinued Given patient's intellectual disability (lives alone)-suspect she would benefit from having a very simple insulin  regimen-hopefully we can get her to once daily basal insulin -and avoid short acting insulin  altogether.  Will switch over to Lantus  15 units daily-and add metformin  and Tradjenta -and see how she does. Goal would be to allow some amount of permissive hyperglycemia.  HLD Statin  GERD PPI  DiGeorge's syndrome Intellectual disability Ectrodactyly Will get TOC involved-difficult situation-has diabetes-on insulin -now here with unintentional hyperglycemia due to inadvertent use of wrong insulin .   Code status:   Code Status: Full Code   DVT Prophylaxis: enoxaparin  (LOVENOX ) injection 30 mg Start: 12/21/23 1000   Family Communication: Brother-[phone] -left VM 9/16    Disposition Plan: Status is: Observation The patient will require care spanning > 2 midnights and should be moved to inpatient because: Severity of illness   Planned Discharge Destination:Home   Diet: Diet Order             Diet Carb Modified Fluid consistency: Thin; Room service appropriate? Yes  Diet effective now  Antimicrobial agents: Anti-infectives (From admission, onward)    None        MEDICATIONS: Scheduled Meds:  aspirin  EC  81 mg Oral QHS   atorvastatin   40 mg Oral QHS   enoxaparin  (LOVENOX ) injection  30 mg Subcutaneous Q24H   insulin  aspart  0-9 Units Subcutaneous TID WC   insulin  glargine  15 Units Subcutaneous Daily   linagliptin   5 mg Oral Daily   [START ON 12/23/2023] metFORMIN   500 mg  Oral Q breakfast   pantoprazole   40 mg Oral Daily   sodium chloride  flush  3 mL Intravenous Q12H   Continuous Infusions: PRN Meds:.acetaminophen  **OR** acetaminophen , dextrose , sodium chloride  flush   I have personally reviewed following labs and imaging studies  LABORATORY DATA: CBC: Recent Labs  Lab 12/20/23 1742 12/21/23 0457 12/22/23 0435  WBC 5.6 6.4 6.3  NEUTROABS 4.2  --  3.1  HGB 11.6* 10.5* 11.0*  HCT 36.7 32.4* 33.7*  MCV 91.1 90.0 88.5  PLT 318 298 320    Basic Metabolic Panel: Recent Labs  Lab 12/20/23 1742 12/21/23 0457 12/22/23 0435  NA 131* 129* 135  K 3.5 4.3 4.1  CL 99 98 106  CO2 19* 25 23  GLUCOSE 188* 471* 94  BUN 16 21* 19  CREATININE 0.66 0.93 0.72  CALCIUM  8.7* 8.8* 8.7*  MG 1.6* 2.1  --     GFR: Estimated Creatinine Clearance: 52.4 mL/min (by C-G formula based on SCr of 0.72 mg/dL).  Liver Function Tests: Recent Labs  Lab 12/20/23 1742  AST 26  ALT 28  ALKPHOS 77  BILITOT 0.5  PROT 7.5  ALBUMIN 3.6   No results for input(s): LIPASE, AMYLASE in the last 168 hours. No results for input(s): AMMONIA in the last 168 hours.  Coagulation Profile: No results for input(s): INR, PROTIME in the last 168 hours.  Cardiac Enzymes: No results for input(s): CKTOTAL, CKMB, CKMBINDEX, TROPONINI in the last 168 hours.  BNP (last 3 results) No results for input(s): PROBNP in the last 8760 hours.  Lipid Profile: No results for input(s): CHOL, HDL, LDLCALC, TRIG, CHOLHDL, LDLDIRECT in the last 72 hours.  Thyroid Function Tests: No results for input(s): TSH, T4TOTAL, FREET4, T3FREE, THYROIDAB in the last 72 hours.  Anemia Panel: No results for input(s): VITAMINB12, FOLATE, FERRITIN, TIBC, IRON, RETICCTPCT in the last 72 hours.  Urine analysis:    Component Value Date/Time   COLORURINE YELLOW 10/16/2023 1915   APPEARANCEUR TURBID (A) 10/16/2023 1915   LABSPEC 1.014 10/16/2023 1915    PHURINE 7.0 10/16/2023 1915   GLUCOSEU >=500 (A) 10/16/2023 1915   HGBUR MODERATE (A) 10/16/2023 1915   BILIRUBINUR NEGATIVE 10/16/2023 1915   KETONESUR 80 (A) 10/16/2023 1915   PROTEINUR 100 (A) 10/16/2023 1915   UROBILINOGEN 0.2 01/15/2007 2203   NITRITE NEGATIVE 10/16/2023 1915   LEUKOCYTESUR LARGE (A) 10/16/2023 1915    Sepsis Labs: Lactic Acid, Venous    Component Value Date/Time   LATICACIDVEN 2.2 (HH) 09/24/2023 9394    MICROBIOLOGY: No results found for this or any previous visit (from the past 240 hours).  RADIOLOGY STUDIES/RESULTS: No results found.   LOS: 0 days   Donalda Applebaum, MD  [facility]    To contact the attending provider between 7A-7P or the covering provider during after hours 7P-7A, please log into the web site [email or web address] and access using universal [place] password for that web site. If you do not have the password, please call the hospital operator.  12/22/2023,  9:12 AM

## 2023-12-22 NOTE — Telephone Encounter (Signed)
 Pharmacy Patient Advocate Encounter   Received notification from Inpatient Request that prior authorization for FreeStyle Libre 3 Plus Sensor  is required/requested.   Insurance verification completed.   The patient is insured through [facility] MEDICAID .   Per test claim: PA required; PA submitted to above mentioned insurance via Latent Key/confirmation #/EOC [facility] Status is pending

## 2023-12-22 NOTE — TOC Initial Note (Addendum)
 [facility] - Initial/Assessment Note    Patient Details  Name: [name] MRN: [id number] Date of Birth: 1975-06-09  Transition [facility] CM/SW Contact:    Marval Gell, RN Phone Number: 12/22/2023, 1:52 PM  Clinical Narrative:                  Spoke w patient to discuss supports at DC.  She states that she lives in apartment on her own. She went out and forgot her lantus  and dosed her Novalog too high. She states she now understands that she can't do this. She states hat she manages all her Rxs herself, and does not have barriers getting medications. She walks to pharmacy and can afford medications.  She states she walk to the grocery store (lives around the corner from [place] and [place] and is walkable distance from bus stop, grocery, pharmacy, and stores). Patient may need assistance with transportation home, would be appropriate for bus pass.     Barriers to Discharge: Continued Medical Work up   Patient Goals and CMS Choice Patient states their goals for this hospitalization and ongoing recovery are:: to go home          Expected Discharge Plan and Services   Discharge Planning Services: CM Consult Post Acute Care Choice: NA Living arrangements for the past 2 months: Apartment                                      Prior Living Arrangements/Services Living arrangements for the past 2 months: Apartment Lives with:: Self                   Activities of Daily Living      Permission Sought/Granted                  Emotional Assessment              Admission diagnosis:  Hypoglycemia [E16.2] Patient Active Problem List   Diagnosis Date Noted   Hypomagnesemia 12/21/2023   DiGeorge syndrome (HCC) 12/21/2023   Hypoglycemia 12/20/2023   Hyperlipidemia 09/25/2023   Hydronephrosis of left kidney 09/25/2023   Pyelonephritis 09/24/2023   Menorrhagia with regular cycle 04/14/2023   Hypoglycemia due to insulin  10/22/2022    Hypotension due to hypovolemia 12/17/2021   Acute lower UTI 12/17/2021   AKI (acute kidney injury) (HCC) 12/17/2021   Type 2 diabetes mellitus (HCC) 12/17/2021   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HCC) 12/17/2021   Chest pain 09/28/2020   GERD (gastroesophageal reflux disease) 10/20/2019   Intellectual disability 03/09/2018   Microcephaly (HCC) 02/19/2017   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   Incarcerated ventral hernia 02/10/2017   DM (diabetes mellitus) type 2, uncontrolled, with ketoacidosis (HCC) 02/10/2017   Protein-calorie malnutrition, severe (HCC) 02/10/2017   Type 2 diabetes mellitus with hyperglycemia, with long-term current use of insulin  (HCC) 02/10/2017   H/O pelvic mass    DKA (diabetic ketoacidosis) (HCC) 02/09/2017   Hyponatremia 02/09/2017   Hyperkalemia 02/09/2017   Homelessness 02/09/2017   Giant ovarian mass    PCP:  Neita Dover, PA-C Pharmacy:   [id number] - [place], [place] - [address] AT [facility] OF [address] [place]  [address] Phone: [phone] Fax: [phone]  [id number] [place],  - [address]  [place] [address] Phone: [phone] Fax: [phone]  [facility] [address] [place] [address] Phone: [phone] Fax: [phone]     Social Drivers of Health (SDOH) Social History: SDOH Screenings   Food Insecurity: No Food Insecurity (12/21/2023)  Housing: Low Risk  (12/21/2023)  Transportation Needs: No Transportation Needs (12/21/2023)  Utilities: Not At Risk (12/21/2023)  Depression (PHQ2-9): Low Risk  (09/30/2018)  Tobacco Use: Low Risk  (12/15/2023)   SDOH Interventions:     Readmission Risk Interventions    09/25/2023    2:09 PM  Readmission Risk Prevention Plan  Post Dischage Appt Complete  Medication Screening Complete  Transportation Screening Complete

## 2023-12-22 NOTE — Plan of Care (Signed)

## 2023-12-22 NOTE — Telephone Encounter (Signed)
 Patient Product/process development scientist completed.    The patient is insured through [facility] [employer].     Ran test claim for [employer] and Requires Prior Authorization  Ran test claim for [employer] and Requires Prior Authorization  This test claim was processed through [employer]- copay amounts may vary at other pharmacies due to [employer], or as the patient moves through the different stages of their insurance plan.     Reyes Sharps, CPHT Pharmacy Technician III Certified Patient Advocate [facility] Pharmacy Patient Advocate Team Direct Number: [phone]  Fax: [phone]

## 2023-12-22 NOTE — Inpatient Diabetes Management (Signed)
 Inpatient Diabetes Program Recommendations  AACE/ADA: New Consensus Statement on Inpatient Glycemic Control   Target Ranges:  Prepandial:   less than 140 mg/dL      Peak postprandial:   less than 180 mg/dL (1-2 hours)      Critically ill patients:  140 - 180 mg/dL    Latest Reference Range & Units 12/21/23 04:57  Hemoglobin A1C 4.8 - 5.6 % 7.7 (H)    Latest Reference Range & Units 12/21/23 16:17 12/21/23 20:04 12/21/23 20:10 12/22/23 00:19 12/22/23 00:47 12/22/23 02:33 12/22/23 04:04 12/22/23 04:36 12/22/23 08:06  Glucose-Capillary 70 - 99 mg/dL 693 (H) 703 (H) 668 (H) 414 (H) 385 (H) 145 (H) 39 (LL) 111 (H) 195 (H)    Review of Glycemic Control  Diabetes history: DM2 Outpatient Diabetes medications: Lantus  40 units in the morning and 16 units at bedtime, Humalog 5 units TID, Metformin  500 mg daily  Current orders for Inpatient glycemic control: Lantus  15 units daily, Novolog  0-9 units TID with meals, Metformin  500mg  daily, and Tradjenta  5mg  daily.   Inpatient Diabetes Program Recommendations:  Lantus  increased to 15 units today and Metformin  and Tradjenta  started by MD. Will continue to monitor.   Spoke with patient at bedside. Patient states her prescribed home insulin  regimen was Lantus  40 units in the morning and 16 units at bedtime and Humalog 6 units TID with meals. However, patient states she did NOT take her Lantus  16 units at bedtime very frequently and only took her Humalog when she remembered to do so. Spoke with patient about diabetes and home regimen for diabetes control. Patient reports being followed by provider for diabetes management at [facility] in [facility] and has a scheduled appt on Friday. Inquired about prior A1C and patient reports not being able to recall last A1C value. Discussed A1C results of 7.7% and explained that current A1C indicates an average glucose of 174 mg/dl over the past 2-3 months. Discussed glucose and A1C goals. Discussed importance of  checking CBGs and maintaining good CBG control to prevent long-term and short-term complications. Explained how hyperglycemia leads to damage within blood vessels which lead to the common complications seen with uncontrolled diabetes. Stressed to the patient the importance of improving glycemic control to prevent further complications from uncontrolled diabetes. Discussed impact of nutrition, exercise, stress, sickness, and medications on diabetes control. Educated patient on FreeStyle Libre3 CGM regarding application and changing CGM sensor (alternate every 14 days on back of arms), 1 hour warm-up, how to scan sensor to start a new sensor, and how to use app to check glucose. Patient has been given Freestyle Libre3 sensor sample (okay to provide patient samples per Dr Raenelle). Provided educational packet regarding FreeStyle Libre3 CGM.  Patient plans to use FreeStyle Libre3 app to read FreeStyle Libre sensor and was able to download the app and create an account. Informed patient that it would be requested that attending provider provide Rx for first month of FreeStyle Libre3 sensors and that she have outpatient provider continue to provide Rx for FreeStyle Libre3 sensors going forward. Asked patient to be sure to let her provider know about Liane and allow provider to review reports from [place] app so the provider can use the information to continue to make adjustments with DM medications if needed. Patient verbalized understanding of information discussed and reports no further questions at this time related to diabetes.    Thanks,  Lavanda Search, RN, MSN, [facility]  Inpatient Diabetes Coordinator  Pager [phone] (8a-5p)

## 2023-12-23 ENCOUNTER — Other Ambulatory Visit (HOSPITAL_COMMUNITY): Payer: Self-pay

## 2023-12-23 DIAGNOSIS — E162 Hypoglycemia, unspecified: Secondary | ICD-10-CM | POA: Diagnosis not present

## 2023-12-23 DIAGNOSIS — E1110 Type 2 diabetes mellitus with ketoacidosis without coma: Secondary | ICD-10-CM

## 2023-12-23 DIAGNOSIS — E785 Hyperlipidemia, unspecified: Secondary | ICD-10-CM | POA: Diagnosis not present

## 2023-12-23 LAB — GLUCOSE, CAPILLARY
Glucose-Capillary: 229 mg/dL — ABNORMAL HIGH (ref 70–99)
Glucose-Capillary: 287 mg/dL — ABNORMAL HIGH (ref 70–99)

## 2023-12-23 MED ORDER — INSULIN GLARGINE 100 UNIT/ML ~~LOC~~ SOLN
22.0000 [IU] | Freq: Every day | SUBCUTANEOUS | Status: DC
  Administered 2023-12-23: 22 [IU] via SUBCUTANEOUS
  Filled 2023-12-23: qty 0.22

## 2023-12-23 MED ORDER — INSULIN GLARGINE 100 UNIT/ML ~~LOC~~ SOLN
20.0000 [IU] | Freq: Every day | SUBCUTANEOUS | Status: DC
  Filled 2023-12-23: qty 0.2

## 2023-12-23 MED ORDER — FREESTYLE LIBRE 3 PLUS SENSOR MISC
1 refills | Status: AC
  Filled 2023-12-23: qty 2, 30d supply, fill #0

## 2023-12-23 MED ORDER — INSULIN GLARGINE 100 UNIT/ML SOLOSTAR PEN: 22.0000 [IU] | PEN_INJECTOR | Freq: Every day | SUBCUTANEOUS | Status: AC

## 2023-12-23 NOTE — Plan of Care (Signed)
  Problem: Fluid Volume: Goal: Ability to maintain a balanced intake and output will improve Outcome: Progressing   Problem: Health Behavior/Discharge Planning: Goal: Ability to identify and utilize available resources and services will improve Outcome: Progressing   Problem: Metabolic: Goal: Ability to maintain appropriate glucose levels will improve Outcome: Progressing   Problem: Skin Integrity: Goal: Risk for impaired skin integrity will decrease Outcome: Progressing   Problem: Tissue Perfusion: Goal: Adequacy of tissue perfusion will improve Outcome: Progressing   Problem: Coping: Goal: Level of anxiety will decrease Outcome: Progressing   Problem: Skin Integrity: Goal: Risk for impaired skin integrity will decrease Outcome: Progressing

## 2023-12-23 NOTE — Discharge Summary (Addendum)
 PATIENT DETAILS Name: [name] Age: 48 y.o. Sex: female Date of Birth: January 12, 1976 MRN: [id number]. Admitting Physician: Tina CHRISTELLA Applebaum, MD ERE:Tjuuzmdnw, [name]  Admit Date: 12/20/2023 Discharge date: 12/23/2023  Recommendations for Outpatient Follow-up:  Follow up with PCP in 1-2 weeks Please obtain CMP/CBC in one week Ensure follow-up with endocrinology-apparently sees endocrinology at [facility].  Admitted From:  Home  Disposition: Home   Discharge Condition: good  CODE STATUS:   Code Status: Full Code   Diet recommendation:  Diet Order             Diet - low sodium heart healthy           Diet Carb Modified           Diet Carb Modified Fluid consistency: Thin; Room service appropriate? Yes  Diet effective now                    Brief Summary: Patient is a 48 y.o.  female with history of DM-2, DiGeorge syndrome, microcephaly, intellectual disability ectrodactyly who presented to the hospital with hypoglycemia secondary to accidental administration of 40 units of NovoLog  thought she was taking 40 units of Lantus )   Significant events: 9/14>> admit to [facility]   Significant studies: None   Significant microbiology data: None   Procedures: None   Consults: None  Brief Hospital Course: Hypoglycemia Secondary to accidental/unintentional use of 40 units of Humalog (patient thought she was taking 40 units of Lantus ) No further episodes of hypoglycemia for the past 24 hours-stable for discharge today-see below.  DM-2 (A1c 7.7 on 9/15) Home regimen includes Lantus  twice a day along with Humalog-previously was on metformin  but this was discontinued Given patient's intellectual disability (lives alone)-suspect she would benefit from having a very simple insulin  regimen-she was started on 15 units of Lantus  here in the hospital-CBGs are fairly stable-Will increase to 22 units on discharge.  I have asked her to refrain from  taking Humalog for now-and she apparently follows with an endocrinologist at [facility] has an appointment that this coming Friday.  Further changes to her insulin  regimen can be done at the discretion of her primary care practitioner/primary endocrinologist who she follows with regularly.  Extensive education regarding different forms of insulin -hypoglycemic events-needed interventions have been done during this hospitalization-and was again done by me this morning. Oral hypoglycemics were contemplated-however review of prior records at [facility] during recent hospitalization-her C-peptide was low-she probably has no endogenous insulin  secretion-and hence will avoid oral hypoglycemic agents.   Will need to tolerate some amount of mild permissive hyperglycemia to prevent hypoglycemic episodes.   HLD Statin   GERD PPI   DiGeorge's syndrome Intellectual disability Ectrodactyly TOC team input-apparently lives in her own apartment-does her own groceries-has been managing herself for past number of years  Note-patient's brother-was called x 2 this morning-and again yesterday-no response.  Discharge Diagnoses:  Principal Problem:   Hypoglycemia Active Problems:   Type 2 diabetes mellitus (HCC)   Hyperlipidemia   Hypomagnesemia   DiGeorge syndrome [facility])   Discharge Instructions:  Activity:  As tolerated   Discharge Instructions     Diet - low sodium heart healthy   Complete by: As directed    Diet Carb Modified   Complete by: As directed    Discharge instructions   Complete by: As directed    Follow with Primary MD  Tina Belvie, PA-C in 1-2 weeks  Please get a complete blood count  and chemistry panel checked by your Primary MD at your next visit, and again as instructed by your Primary MD.  Get Medicines reviewed and adjusted: Please take all your medications with you for your next visit with your Primary MD  Laboratory/radiological  data: Please request your Primary MD to go over all hospital tests and procedure/radiological results at the follow up, please ask your Primary MD to get all Hospital records sent to his/her office.  In some cases, they will be blood work, cultures and biopsy results pending at the time of your discharge. Please request that your primary care M.D. follows up on these results.  Also Note the following: If you experience worsening of your admission symptoms, develop shortness of breath, life threatening emergency, suicidal or homicidal thoughts you must seek medical attention immediately by calling 911 or calling your MD immediately  if symptoms less severe.  You must read complete instructions/literature along with all the possible adverse reactions/side effects for all the Medicines you take and that have been prescribed to you. Take any new Medicines after you have completely understood and accpet all the possible adverse reactions/side effects.   Do not drive when taking Pain medications or sleeping medications (Benzodaizepines)  Do not take more than prescribed Pain, Sleep and Anxiety Medications. It is not advisable to combine anxiety,sleep and pain medications without talking with your primary care practitioner  Special Instructions: If you have smoked or chewed Tobacco  in the last 2 yrs please stop smoking, stop any regular Alcohol  and or any Recreational drug use.  Wear Seat belts while driving.  Please note: You were cared for by a hospitalist during your hospital stay. Once you are discharged, your primary care physician will handle any further medical issues. Please note that NO REFILLS for any discharge medications will be authorized once you are discharged, as it is imperative that you return to your primary care physician (or establish a relationship with a primary care physician if you do not have one) for your post hospital discharge needs so that they can reassess your need for  medications and monitor your lab values.   Increase activity slowly   Complete by: As directed       Allergies as of 12/23/2023       Reactions   Metformin  Nausea Only        Medication List     STOP taking these medications    Dulaglutide 1.5 MG/0.5ML Soaj   insulin  lispro 100 UNIT/ML injection Commonly known as: HUMALOG   metFORMIN  500 MG tablet Commonly known as: GLUCOPHAGE        TAKE these medications    Accu-Chek Guide test strip Generic drug: glucose blood Use 3 (three) times daily as instructed   Accu-Chek Softclix Lancets lancets Use 3 (three) times daily as directed   Aspirin  Low Dose 81 MG tablet Generic drug: aspirin  EC Take 1 tablet (81 mg total) by mouth daily. What changed: when to take this   atorvastatin  40 MG tablet Commonly known as: LIPITOR Take 40 mg by mouth at bedtime.   ferrous sulfate  300 (60 Fe) MG/5ML syrup Take 5 mLs (300 mg total) by mouth daily.   FreeStyle Libre 3 Sensor Misc Place 1 sensor on the skin every 14 days. Use to check glucose continuously   HYDROcodone -acetaminophen  5-325 MG tablet Commonly known as: NORCO/VICODIN Take 1 tablet by mouth every 6 (six) hours as needed.   insulin  glargine 100 UNIT/ML Solostar Pen Commonly known as: LANTUS   Inject 22 Units into the skin daily. Start taking on: December 24, 2023 What changed:  how much to take when to take this additional instructions   omeprazole  20 MG capsule Commonly known as: PRILOSEC Take 1 capsule (20 mg total) by mouth daily. What changed: when to take this   ondansetron  4 MG disintegrating tablet Commonly known as: ZOFRAN -ODT Take 1 tablet (4 mg total) by mouth every 8 (eight) hours as needed for nausea or vomiting.   sucralfate 1 GM/10ML suspension Commonly known as: CARAFATE Take 1 g by mouth daily.   Vitamin D (Ergocalciferol) 1.25 MG (50000 UNIT) Caps capsule Commonly known as: DRISDOL Take 50,000 Units by mouth once a week.         Allergies  Allergen Reactions   Metformin  Nausea Only     Other Procedures/Studies: No results found.   TODAY-DAY OF DISCHARGE:  Subjective:   [name] today has no headache,no chest abdominal pain,no new weakness tingling or numbness, feels much better wants to go home today.  Objective:   Blood pressure 115/61, pulse 78, temperature 97.6 F (36.4 C), temperature source Oral, resp. rate 16, height 4' 9 (1.448 m), weight 39.4 kg, SpO2 95%.  Intake/Output Summary (Last 24 hours) at 12/23/2023 0957 Last data filed at 12/23/2023 0819 Gross per 24 hour  Intake 3 ml  Output --  Net 3 ml   Filed Weights   12/20/23 1733  Weight: 39.4 kg    Exam: Awake Alert, Oriented *3, No new F.N deficits, Normal affect [place].AT,PERRAL Supple Neck,No JVD, No cervical lymphadenopathy appriciated.  Symmetrical Chest wall movement, Good air movement bilaterally, CTAB RRR,No Gallops,Rubs or new Murmurs, No Parasternal Heave +ve B.Sounds, Abd Soft, Non tender, No organomegaly appriciated, No rebound -guarding or rigidity. No Cyanosis, Clubbing or edema, No new Rash or bruise   PERTINENT RADIOLOGIC STUDIES: No results found.   PERTINENT LAB RESULTS: CBC: Recent Labs    12/21/23 0457 12/22/23 0435  WBC 6.4 6.3  HGB 10.5* 11.0*  HCT 32.4* 33.7*  PLT 298 320   CMET CMP     Component Value Date/Time   NA 135 12/22/2023 0435   K 4.1 12/22/2023 0435   CL 106 12/22/2023 0435   CO2 23 12/22/2023 0435   GLUCOSE 94 12/22/2023 0435   BUN 19 12/22/2023 0435   CREATININE 0.72 12/22/2023 0435   CALCIUM  8.7 (L) 12/22/2023 0435   PROT 7.5 12/20/2023 1742   ALBUMIN 3.6 12/20/2023 1742   AST 26 12/20/2023 1742   ALT 28 12/20/2023 1742   ALKPHOS 77 12/20/2023 1742   BILITOT 0.5 12/20/2023 1742   GFRNONAA >60 12/22/2023 0435    GFR Estimated Creatinine Clearance: 52.4 mL/min (by C-G formula based on SCr of 0.72 mg/dL). No results for input(s): LIPASE, AMYLASE in the  last 72 hours. No results for input(s): CKTOTAL, CKMB, CKMBINDEX, TROPONINI in the last 72 hours. Invalid input(s): POCBNP No results for input(s): DDIMER in the last 72 hours. Recent Labs    12/21/23 0457  HGBA1C 7.7*   No results for input(s): CHOL, HDL, LDLCALC, TRIG, CHOLHDL, LDLDIRECT in the last 72 hours. No results for input(s): TSH, T4TOTAL, T3FREE, THYROIDAB in the last 72 hours.  Invalid input(s): FREET3 No results for input(s): VITAMINB12, FOLATE, FERRITIN, TIBC, IRON, RETICCTPCT in the last 72 hours. Coags: No results for input(s): INR in the last 72 hours.  Invalid input(s): PT Microbiology: No results found for this or any previous visit (from the past 240 hours).  FURTHER  DISCHARGE INSTRUCTIONS:  Get Medicines reviewed and adjusted: Please take all your medications with you for your next visit with your Primary MD  Laboratory/radiological data: Please request your Primary MD to go over all hospital tests and procedure/radiological results at the follow up, please ask your Primary MD to get all Hospital records sent to his/her office.  In some cases, they will be blood work, cultures and biopsy results pending at the time of your discharge. Please request that your primary care M.D. goes through all the records of your hospital data and follows up on these results.  Also Note the following: If you experience worsening of your admission symptoms, develop shortness of breath, life threatening emergency, suicidal or homicidal thoughts you must seek medical attention immediately by calling 911 or calling your MD immediately  if symptoms less severe.  You must read complete instructions/literature along with all the possible adverse reactions/side effects for all the Medicines you take and that have been prescribed to you. Take any new Medicines after you have completely understood and accpet all the possible adverse  reactions/side effects.   Do not drive when taking Pain medications or sleeping medications (Benzodaizepines)  Do not take more than prescribed Pain, Sleep and Anxiety Medications. It is not advisable to combine anxiety,sleep and pain medications without talking with your primary care practitioner  Special Instructions: If you have smoked or chewed Tobacco  in the last 2 yrs please stop smoking, stop any regular Alcohol  and or any Recreational drug use.  Wear Seat belts while driving.  Please note: You were cared for by a hospitalist during your hospital stay. Once you are discharged, your primary care physician will handle any further medical issues. Please note that NO REFILLS for any discharge medications will be authorized once you are discharged, as it is imperative that you return to your primary care physician (or establish a relationship with a primary care physician if you do not have one) for your post hospital discharge needs so that they can reassess your need for medications and monitor your lab values.  Total Time spent coordinating discharge including counseling, education and face to face time equals greater than 30 minutes.  SignedBETHA [name] 12/23/2023 9:57 AM

## 2024-01-06 NOTE — Congregational Nurse Program (Signed)
  Dept: [phone]   Congregational Nurse Program Note  Date of Encounter: 12/29/2023  Resident had questions about her blood glucose levels but is not eating regularly, having low blood glucose in AM, high in the evening from not taking her insulin  as prescribed.  Discussed eating breakfast and taking AM insulin  to prevent high glucose levels in the evening. Past Medical History: Past Medical History:  Diagnosis Date   Diabetes mellitus without complication (HCC)    DiGeorge syndrome (HCC)    Ectrodactyly    Genetic defect    genetic mutation of unknown significance per [facility] genetic notes   GERD (gastroesophageal reflux disease)    Hypercholesterolemia    Hypertension    Intellectual disability    Microcephaly (HCC)    Pelvic mass    Ventral hernia     Encounter Details:  Community Questionnaire - 12/29/23 1730       Questionnaire   Ask client: Do you give verbal consent for me to treat you today? Yes    Student Assistance N/A    Location Patient Served  [place]    Encounter Setting CN site    Population Status Unknown   Has apartment at [facility]    Insurance/Financial Assistance Referral N/A    Medication Patient Medications Reviewed    Medical Provider Yes    Screening Referrals Made N/A    Medical Referrals Made N/A    Medical Appointment Completed N/A    CNP Interventions Advocate/Support;Counsel;Case Management;Educate    Screenings CN Performed N/A    ED Visit Averted Yes    Life-Saving Intervention Made N/A

## 2024-01-12 LAB — GLUCOSE, POCT (MANUAL RESULT ENTRY): POC Glucose: 235 mg/dL — AB (ref 70–99)

## 2024-01-12 NOTE — Congregational Nurse Program (Signed)
  Dept: [phone]   Congregational Nurse Program Note  Date of Encounter: 01/12/2024  Clinic visit to check blood glucose and apply sensor for [facility] for glucose monitoring.  Sensor applied to back left upper forearm and checked with her cellphone to confirm proper application.  Blood glucose 235 per finger stick.  Reviewed meals and insulin  administration for past three days. Past Medical History: Past Medical History:  Diagnosis Date   Diabetes mellitus without complication (HCC)    DiGeorge syndrome (HCC)    Ectrodactyly    Genetic defect    genetic mutation of unknown significance per [facility] genetic notes   GERD (gastroesophageal reflux disease)    Hypercholesterolemia    Hypertension    Intellectual disability    Microcephaly (HCC)    Pelvic mass    Ventral hernia     Encounter Details:  Community Questionnaire - 01/12/24 1535       Questionnaire   Ask client: Do you give verbal consent for me to treat you today? Yes    Student Assistance [employer]    Location Patient Served  [place]    Encounter Setting CN site    Population Status Unknown   Has apartment at [facility]    Insurance/Financial Assistance Referral N/A    Medication Patient Medications Reviewed;Provided Medication Assistance    Medical Provider Yes    Screening Referrals Made N/A    Medical Referrals Made N/A    Medical Appointment Completed Non-[facility] PCP/Clinic    CNP Interventions Advocate/Support;Counsel;Case Management;Educate    Screenings CN Performed Blood Glucose    ED Visit Averted N/A    Life-Saving Intervention Made N/A

## 2024-01-13 ENCOUNTER — Emergency Department (HOSPITAL_COMMUNITY)
Admission: EM | Admit: 2024-01-13 | Discharge: 2024-01-13 | Disposition: A | Discharge status: Home / Self Care | Attending: Emergency Medicine | Admitting: Emergency Medicine

## 2024-01-13 ENCOUNTER — Encounter (HOSPITAL_COMMUNITY): Payer: Self-pay | Admitting: Emergency Medicine

## 2024-01-13 ENCOUNTER — Other Ambulatory Visit: Payer: Self-pay

## 2024-01-13 DIAGNOSIS — Z794 Long term (current) use of insulin: Secondary | ICD-10-CM | POA: Diagnosis not present

## 2024-01-13 DIAGNOSIS — E162 Hypoglycemia, unspecified: Secondary | ICD-10-CM

## 2024-01-13 DIAGNOSIS — N3000 Acute cystitis without hematuria: Secondary | ICD-10-CM | POA: Diagnosis not present

## 2024-01-13 DIAGNOSIS — Z7982 Long term (current) use of aspirin: Secondary | ICD-10-CM | POA: Diagnosis not present

## 2024-01-13 DIAGNOSIS — E11649 Type 2 diabetes mellitus with hypoglycemia without coma: Secondary | ICD-10-CM | POA: Insufficient documentation

## 2024-01-13 LAB — COMPREHENSIVE METABOLIC PANEL WITH GFR
ALT: 27 U/L (ref 0–44)
AST: 35 U/L (ref 15–41)
Albumin: 3.9 g/dL (ref 3.5–5.0)
Alkaline Phosphatase: 95 U/L (ref 38–126)
Anion gap: 10 (ref 5–15)
BUN: 21 mg/dL — ABNORMAL HIGH (ref 6–20)
CO2: 23 mmol/L (ref 22–32)
Calcium: 8.9 mg/dL (ref 8.9–10.3)
Chloride: 103 mmol/L (ref 98–111)
Creatinine, Ser: 0.64 mg/dL (ref 0.44–1.00)
GFR, Estimated: >60 mL/min (ref >60)
Glucose, Bld: 153 mg/dL — ABNORMAL HIGH (ref 70–99)
Potassium: 3.6 mmol/L (ref 3.5–5.1)
Sodium: 137 mmol/L (ref 135–145)
Total Bilirubin: 0.5 mg/dL (ref 0.0–1.2)
Total Protein: 7.3 g/dL (ref 6.5–8.1)

## 2024-01-13 LAB — CBC WITH DIFFERENTIAL/PLATELET
Abs Immature Granulocytes: 0.02 K/uL (ref 0.00–0.07)
Basophils Absolute: 0 K/uL (ref 0.0–0.1)
Basophils Relative: 0 %
Eosinophils Absolute: 0.1 K/uL (ref 0.0–0.5)
Eosinophils Relative: 1 %
HCT: 37.7 % (ref 36.0–46.0)
Hemoglobin: 11.9 g/dL — ABNORMAL LOW (ref 12.0–15.0)
Immature Granulocytes: 0 %
Lymphocytes Relative: 23 %
Lymphs Abs: 1.2 K/uL (ref 0.7–4.0)
MCH: 28.5 pg (ref 26.0–34.0)
MCHC: 31.6 g/dL (ref 30.0–36.0)
MCV: 90.2 fL (ref 80.0–100.0)
Monocytes Absolute: 0.3 K/uL (ref 0.1–1.0)
Monocytes Relative: 7 %
Neutro Abs: 3.6 K/uL (ref 1.7–7.7)
Neutrophils Relative %: 69 %
Platelets: 274 K/uL (ref 150–400)
RBC: 4.18 MIL/uL (ref 3.87–5.11)
RDW: 12.4 % (ref 11.5–15.5)
WBC: 5.2 K/uL (ref 4.0–10.5)
nRBC: 0 % (ref 0.0–0.2)

## 2024-01-13 LAB — URINALYSIS, W/ REFLEX TO CULTURE (INFECTION SUSPECTED)
Bilirubin Urine: NEGATIVE
Glucose, UA: 50 mg/dL — AB
Ketones, ur: NEGATIVE mg/dL
Leukocytes,Ua: NEGATIVE
Nitrite: NEGATIVE
Protein, ur: 100 mg/dL — AB
Specific Gravity, Urine: 1.013 (ref 1.005–1.030)
pH: 7 (ref 5.0–8.0)

## 2024-01-13 LAB — CBG MONITORING, ED
Glucose-Capillary: 134 mg/dL — ABNORMAL HIGH (ref 70–99)
Glucose-Capillary: 132 mg/dL — ABNORMAL HIGH (ref 70–99)
Glucose-Capillary: 175 mg/dL — ABNORMAL HIGH (ref 70–99)

## 2024-01-13 MED ORDER — SUCRALFATE 1 G PO TABS: 1.0000 g | ORAL_TABLET | Freq: Three times a day (TID) | ORAL | 0 refills | Status: AC

## 2024-01-13 MED ORDER — NITROFURANTOIN MONOHYD MACRO 100 MG PO CAPS: 100.0000 mg | ORAL_CAPSULE | Freq: Two times a day (BID) | ORAL | 0 refills | Status: AC

## 2024-01-13 MED ORDER — SODIUM CHLORIDE 0.9 % IV BOLUS
1000.0000 mL | Freq: Once | INTRAVENOUS | Status: AC
  Administered 2024-01-13: 1000 mL via INTRAVENOUS

## 2024-01-13 MED ORDER — NITROFURANTOIN MONOHYD MACRO 100 MG PO CAPS
100.0000 mg | ORAL_CAPSULE | Freq: Once | ORAL | Status: AC
  Administered 2024-01-13: 100 mg via ORAL
  Filled 2024-01-13: qty 1

## 2024-01-13 NOTE — ED Notes (Signed)
 CBG 175, provider notified.

## 2024-01-13 NOTE — Discharge Instructions (Addendum)
 You have a urinary tract infection.  Please take Macrobid twice a day for 5 days  Your blood sugar was low and please eat normally and check your blood sugar before giving yourself insulin   Follow-up with your doctor  Return to ER if you have another episode of confusion or normal pain or vomiting or fever, blood sugar less than 60 or greater than 600

## 2024-01-13 NOTE — ED Triage Notes (Signed)
 BIB EMS.  Bystander called EMS for pt passed out.  Pt initial CBG was 30.  Given oral glucose by fire but reportedly spat that out.  Pt given D 10 by EMS and glucose improved to 130 but then dropped again to the 60s.  Given a total of 200 mL D10.  Alert and oriented. Given crackers, peanut butter, and sandwich with juice on arrival.  Pt alert and oriented.  20G RFA.

## 2024-01-13 NOTE — ED Provider Notes (Signed)
 [place] EMERGENCY DEPARTMENT AT [facility] Provider Note   CSN: [id number] Arrival date & time: 01/13/24  1751     Patient presents with: No chief complaint on file.   [name] is a 48 y.o. female history of diabetes, DiGeorge syndrome, here presenting with hypoglycemia.  Patient states that she gave herself 5 units of short acting insulin  around 2 PM.  She states that she did not eat as much and then became altered.  She was found by bystanders altered and confused.  CBG was initially 30.  Patient was given oral glucose and then D10 and sugar went up to 110 and then fell back down to 50.  Patient then was started on D10 infusion.  Patient was also given some food and orange juice.  Patient was admitted in middle of September for hypoglycemia   The history is provided by the patient.       Prior to Admission medications   Medication Sig Start Date End Date Taking? Authorizing Provider  Accu-Chek Softclix Lancets lancets Use 3 (three) times daily as directed 08/08/21   Brien Belvie BRAVO, MD  ASPIRIN  LOW DOSE 81 MG EC tablet Take 1 tablet (81 mg total) by mouth daily. Patient taking differently: Take 81 mg by mouth at bedtime. 07/24/21   Brien Belvie BRAVO, MD  atorvastatin  (LIPITOR) 40 MG tablet Take 40 mg by mouth at bedtime. 09/02/23   [provider]  Continuous Glucose Sensor (FREESTYLE LIBRE 3 PLUS SENSOR) MISC Place 1 sensor on the skin every 15 days. Use to check glucose continuously 12/23/23   Ghimire, Donalda HERO, MD  ferrous sulfate  300 (60 Fe) MG/5ML syrup Take 5 mLs (300 mg total) by mouth daily. 06/28/22   Suellen Cantor A, PA-C  glucose blood (ACCU-CHEK GUIDE) test strip Use 3 (three) times daily as instructed 07/03/21   Brien Belvie BRAVO, MD  HYDROcodone -acetaminophen  (NORCO/VICODIN) 5-325 MG tablet Take 1 tablet by mouth every 6 (six) hours as needed. 10/16/23   Prosperi, Christian H, PA-C  insulin  glargine (LANTUS ) 100 UNIT/ML Solostar Pen Inject  22 Units into the skin daily. 12/24/23   Ghimire, Donalda HERO, MD  omeprazole  (PRILOSEC) 20 MG capsule Take 1 capsule (20 mg total) by mouth daily. Patient taking differently: Take 20 mg by mouth daily before breakfast. 06/26/17   Mitchell, Jessica B, PA-C  ondansetron  (ZOFRAN -ODT) 4 MG disintegrating tablet Take 1 tablet (4 mg total) by mouth every 8 (eight) hours as needed for nausea or vomiting. Patient not taking: Reported on 12/20/2023 09/28/23   Odell Celinda Balo, MD  sucralfate (CARAFATE) 1 GM/10ML suspension Take 1 g by mouth daily. 11/04/23   [provider]  Vitamin D, Ergocalciferol, (DRISDOL) 1.25 MG (50000 UNIT) CAPS capsule Take 50,000 Units by mouth once a week. Patient not taking: Reported on 09/24/2023 06/11/23   [provider]    Allergies: Metformin     Review of Systems  Psychiatric/Behavioral:  Positive for confusion.   All other systems reviewed and are negative.   Updated Vital Signs There were no vitals taken for this visit.  Physical Exam Vitals and nursing note reviewed.  Constitutional:      Comments: Chronically ill and slightly dehydrated  HENT:     Head: Normocephalic.     Nose: Nose normal.     Mouth/Throat:     Mouth: Mucous membranes are dry.  Eyes:     Extraocular Movements: Extraocular movements intact.     Pupils: Pupils are equal, round, and  reactive to light.  Cardiovascular:     Rate and Rhythm: Normal rate and regular rhythm.     Pulses: Normal pulses.     Heart sounds: Normal heart sounds.  Pulmonary:     Effort: Pulmonary effort is normal.     Breath sounds: Normal breath sounds.  Abdominal:     General: Abdomen is flat.     Palpations: Abdomen is soft.  Musculoskeletal:        General: Normal range of motion.     Cervical back: Normal range of motion and neck supple.  Skin:    General: Skin is warm.     Capillary Refill: Capillary refill takes less than 2 seconds.  Neurological:     General: No focal deficit  present.     Mental Status: She is oriented to person, place, and time.  Psychiatric:        Mood and Affect: Mood normal.        Behavior: Behavior normal.     (all labs ordered are listed, but only abnormal results are displayed) Labs Reviewed  CBC WITH DIFFERENTIAL/PLATELET  COMPREHENSIVE METABOLIC PANEL WITH GFR  CBG MONITORING, ED    EKG: None  Radiology: No results found.   Procedures   Medications Ordered in the ED  sodium chloride  0.9 % bolus 1,000 mL (has no administration in time range)                                    Medical Decision Making [name] is a 48 y.o. female who presenting with hypoglycemia.  This is a recurrent problem for her.  Patient apparently gave herself 5 units of insulin  and did not eat afterwards.  Patient claims that she used short acting insulin  and not Lantus .  Will recheck CBG and check CBC and BMP and hydrate patient and reassess.  9:37 PM I reviewed patient's labs and initial blood sugar was 132.  Patient does have a UTI.  Previous urine culture is sensitive to Macrobid.  Patient was given Macrobid in the ED.  Patient also monitored multiple hours in the ER and now blood sugar is 175.  Told her to check her blood sugar before giving herself insulin .  At this point patient is stable for discharge.  Problems Addressed: Acute cystitis without hematuria: acute illness or injury Hypoglycemia: acute illness or injury  Amount and/or Complexity of Data Reviewed Labs: ordered.  Risk Prescription drug management.    Final diagnoses:  None    ED Discharge Orders     None          Patt Alm Macho, MD 01/13/24 2137

## 2024-01-15 LAB — URINE CULTURE: Culture: 50000 — AB

## 2024-01-19 NOTE — Congregational Nurse Program (Signed)
  Dept: [phone]   Congregational Nurse Program Note  Date of Encounter: 01/19/2024  Resident brought glucometer and checked blood glucose, 262 this time, was 116 this AM.  ER visit last week with blood glucose of 37, states she took insulin  but didn't eat.  Discussed need to eat regular meals and take insulin  as prescribed.  Voiced understanding of need to eat regular meals. Past Medical History: Past Medical History:  Diagnosis Date   Diabetes mellitus without complication (HCC)    DiGeorge syndrome (HCC)    Ectrodactyly    Genetic defect    genetic mutation of unknown significance per [facility] genetic notes   GERD (gastroesophageal reflux disease)    Hypercholesterolemia    Hypertension    Intellectual disability    Microcephaly (HCC)    Pelvic mass    Ventral hernia     Encounter Details:  Community Questionnaire - 01/19/24 1740       Questionnaire   Ask client: Do you give verbal consent for me to treat you today? Yes    Student Assistance [employer]    Location Patient Served  [place]    Encounter Setting CN site    Population Status Unknown   Has apartment at [facility]    Insurance/Financial Assistance Referral N/A    Medication Patient Medications Reviewed;Provided Medication Assistance    Medical Provider Yes    Screening Referrals Made N/A    Medical Referrals Made N/A    Medical Appointment Completed N/A    CNP Interventions Advocate/Support;Counsel;Case Management;Educate    Screenings CN Performed Blood Glucose    ED Visit Averted N/A    Life-Saving Intervention Made N/A

## 2024-02-02 LAB — GLUCOSE, POCT (MANUAL RESULT ENTRY): POC Glucose: 295 mg/dL — AB (ref 70–99)

## 2024-02-02 NOTE — Congregational Nurse Program (Signed)
  Dept: [phone]   Congregational Nurse Program Note  Date of Encounter: 12/08/2023  Clinic visit to follow-up on episode of hypoglycemia.  Reviewed insulin  order and discussed when to eat to avoid the episodes of low blood glucose. Past Medical History: Past Medical History:  Diagnosis Date   Diabetes mellitus without complication (HCC)    DiGeorge syndrome (HCC)    Ectrodactyly    Genetic defect    genetic mutation of unknown significance per [facility] genetic notes   GERD (gastroesophageal reflux disease)    Hypercholesterolemia    Hypertension    Intellectual disability    Microcephaly (HCC)    Pelvic mass    Ventral hernia     Encounter Details:

## 2024-02-09 NOTE — Congregational Nurse Program (Signed)
  Dept: [phone]   Congregational Nurse Program Note  Date of Encounter: 02/02/2024  Clinic visit to assist resident with applying [facility] sensor.  Blood glucose 295 via fingerstick, reviewed intake and insulin  dosage.  Applied new sensor to right upper arm dorsal side after cleaning area with alcohol wipes.reminded resident that glucose reading are available 60 minutes after the sensor is applied. Past Medical History: Past Medical History:  Diagnosis Date   Diabetes mellitus without complication (HCC)    DiGeorge syndrome (HCC)    Ectrodactyly    Genetic defect    genetic mutation of unknown significance per [facility] genetic notes   GERD (gastroesophageal reflux disease)    Hypercholesterolemia    Hypertension    Intellectual disability    Microcephaly (HCC)    Pelvic mass    Ventral hernia     Encounter Details:  Community Questionnaire - 02/02/24 1655       Questionnaire   Ask client: Do you give verbal consent for me to treat you today? Yes    Student Assistance N/A    Location Patient Served  [place]    Encounter Setting CN site    Population Status Unknown   Has apartment at [facility]    Insurance/Financial Assistance Referral N/A    Medication Patient Medications Reviewed;Provided Medication Assistance    Medical Provider Yes    Screening Referrals Made N/A    Medical Referrals Made N/A    Medical Appointment Completed N/A    CNP Interventions Advocate/Support;Counsel;Case Management;Educate;Spiritual Care    Screenings CN Performed Blood Glucose    ED Visit Averted N/A    Life-Saving Intervention Made N/A

## 2024-02-16 LAB — GLUCOSE, POCT (MANUAL RESULT ENTRY): POC Glucose: 220 mg/dL — AB (ref 70–99)

## 2024-02-16 NOTE — Congregational Nurse Program (Signed)
  Dept: [phone]   Congregational Nurse Program Note  Date of Encounter: 02/16/2024  Clinic visit for assistance with removing a Freestyle Libre 3 sensor and applying a new sensor.  Sensor applied to dorsal side upper left arm, confirmed with her cell phone that sensor was working properly.  Blood glucose per fingerstick 220 prior to senor application.    Reviewed results of blood glucose level results from her Accu-check glucometer, range from 66 11/10 prior to dinner, 142 this AM. Encouraged to eat regular meals and drink water each day.   Past Medical History: Past Medical History:  Diagnosis Date   Diabetes mellitus without complication (HCC)    DiGeorge syndrome (HCC)    Ectrodactyly    Genetic defect    genetic mutation of unknown significance per [facility] genetic notes   GERD (gastroesophageal reflux disease)    Hypercholesterolemia    Hypertension    Intellectual disability    Microcephaly (HCC)    Pelvic mass    Ventral hernia     Encounter Details:  Community Questionnaire - 02/16/24 1500       Questionnaire   Ask client: Do you give verbal consent for me to treat you today? Yes    Student Assistance [employer] Nurse    Location Patient Served  [place]    Encounter Setting CN site    Population Status Unknown   Has apartment at [facility]    Insurance/Financial Assistance Referral N/A    Medication Patient Medications Reviewed;Provided Medication Assistance    Medical Provider Yes    Screening Referrals Made N/A    Medical Referrals Made N/A    Medical Appointment Completed N/A    CNP Interventions Advocate/Support;Counsel;Case Management;Educate;Spiritual Care    Screenings CN Performed Blood Glucose;Blood Pressure;Weight    ED Visit Averted N/A    Life-Saving Intervention Made N/A

## 2024-02-23 LAB — GLUCOSE, POCT (MANUAL RESULT ENTRY): POC Glucose: 165 mg/dL — AB (ref 70–99)

## 2024-02-23 NOTE — Congregational Nurse Program (Signed)
  Dept: [phone]   Congregational Nurse Program Note  Date of Encounter: 02/23/2024  Clinic visit to check blood glucose, this AM blood glucose was 48, states she ate 2 cookies then went to get a chicken biscuit.  Blood glucose 165 AC lunch.  Reviewed times she should take insulin  to help prevent episodes of low blood glucose levels.  Past Medical History: Past Medical History:  Diagnosis Date   Diabetes mellitus without complication (HCC)    DiGeorge syndrome (HCC)    Ectrodactyly    Genetic defect    genetic mutation of unknown significance per [facility] genetic notes   GERD (gastroesophageal reflux disease)    Hypercholesterolemia    Hypertension    Intellectual disability    Microcephaly (HCC)    Pelvic mass    Ventral hernia     Encounter Details:  Community Questionnaire - 02/23/24 1325       Questionnaire   Ask client: Do you give verbal consent for me to treat you today? Yes    Student Assistance [employer] Nurse;[employer]    Location Patient [place]    Encounter Setting CN site    Population Status Unknown   Has apartment at [facility]    Insurance/Financial Assistance Referral N/A    Medication Patient Medications Reviewed    Medical Provider Yes    Screening Referrals Made N/A    Medical Referrals Made N/A    Medical Appointment Completed N/A    CNP Interventions Advocate/Support;Counsel;Case Management;Educate;Spiritual Care    Screenings CN Performed Blood Glucose;Blood Pressure    ED Visit Averted N/A    Life-Saving Intervention Made N/A

## 2024-03-01 LAB — GLUCOSE, POCT (MANUAL RESULT ENTRY): POC Glucose: 250 mg/dL — AB (ref 70–99)

## 2024-03-01 NOTE — Congregational Nurse Program (Signed)
  Dept: [phone]   Congregational Nurse Program Note  Date of Encounter: 03/01/2024   Resident visit to change [facility] sensor.  Removed sensor from left upper forearm.  Cleaned dorsal area right upper forearm and applied new sensor.  Paired sensor with app on her phone, to have glucose readings available 55 minutes.  Weight 109 Lbs, discussed food intake and times to take insulin . Past Medical History: Past Medical History:  Diagnosis Date   Diabetes mellitus without complication (HCC)    DiGeorge syndrome (HCC)    Ectrodactyly    Genetic defect    genetic mutation of unknown significance per [facility] genetic notes   GERD (gastroesophageal reflux disease)    Hypercholesterolemia    Hypertension    Intellectual disability    Microcephaly (HCC)    Pelvic mass    Ventral hernia     Encounter Details:  Community Questionnaire - 03/01/24 1440       Questionnaire   Ask client: Do you give verbal consent for me to treat you today? Yes    Student Assistance [employer] Nurse    Location Patient Served  [place]    Encounter Setting CN site    Population Status Unknown   Has apartment at [facility]    Insurance/Financial Assistance Referral N/A    Medication Patient Medications Reviewed    Medical Provider Yes    Screening Referrals Made N/A    Medical Referrals Made N/A    Medical Appointment Completed N/A    CNP Interventions Advocate/Support;Counsel;Case Management;Educate;Spiritual Care    Screenings CN Performed Blood Pressure;Weight    ED Visit Averted N/A    Life-Saving Intervention Made N/A

## 2024-03-22 ENCOUNTER — Ambulatory Visit (INDEPENDENT_AMBULATORY_CARE_PROVIDER_SITE_OTHER): Admitting: Podiatry

## 2024-03-22 ENCOUNTER — Encounter: Payer: Self-pay | Admitting: Podiatry

## 2024-03-22 DIAGNOSIS — M2012 Hallux valgus (acquired), left foot: Secondary | ICD-10-CM | POA: Diagnosis not present

## 2024-03-22 DIAGNOSIS — M79675 Pain in left toe(s): Secondary | ICD-10-CM

## 2024-03-22 DIAGNOSIS — E119 Type 2 diabetes mellitus without complications: Secondary | ICD-10-CM | POA: Diagnosis not present

## 2024-03-22 DIAGNOSIS — M2041 Other hammer toe(s) (acquired), right foot: Secondary | ICD-10-CM | POA: Diagnosis not present

## 2024-03-22 DIAGNOSIS — B351 Tinea unguium: Secondary | ICD-10-CM | POA: Diagnosis not present

## 2024-03-22 DIAGNOSIS — M2011 Hallux valgus (acquired), right foot: Secondary | ICD-10-CM | POA: Diagnosis not present

## 2024-03-22 DIAGNOSIS — M79674 Pain in right toe(s): Secondary | ICD-10-CM

## 2024-03-22 DIAGNOSIS — Z8631 Personal history of diabetic foot ulcer: Secondary | ICD-10-CM

## 2024-03-22 DIAGNOSIS — M2042 Other hammer toe(s) (acquired), left foot: Secondary | ICD-10-CM

## 2024-03-22 DIAGNOSIS — E1142 Type 2 diabetes mellitus with diabetic polyneuropathy: Secondary | ICD-10-CM | POA: Diagnosis not present

## 2024-03-22 DIAGNOSIS — Z0189 Encounter for other specified special examinations: Secondary | ICD-10-CM | POA: Diagnosis not present

## 2024-03-23 NOTE — Congregational Nurse Program (Signed)
°  Dept: [phone]   Congregational Nurse Program Note  Date of Encounter: 03/22/2024  Clinic visit to change her [facility] sensor, new sensor applied to inner aspect of left upper forearm.  Glucose readings noted by application to be ready in 60 minutes. Past Medical History: Past Medical History:  Diagnosis Date   Diabetes mellitus without complication (HCC)    DiGeorge syndrome (HCC)    Ectrodactyly    Genetic defect    genetic mutation of unknown significance per [facility] genetic notes   GERD (gastroesophageal reflux disease)    Hypercholesterolemia    Hypertension    Intellectual disability    Microcephaly (HCC)    Pelvic mass    Ventral hernia     Encounter Details:  Community Questionnaire - 03/22/24 1600       Questionnaire   Ask client: Do you give verbal consent for me to treat you today? Yes    Student Assistance N/A    Location Patient Served  [place]    Encounter Setting CN site    Population Status Unknown   Has apartment at [facility]    Insurance/Financial Assistance Referral N/A    Medication Patient Medications Reviewed    Medical Provider Yes    Screening Referrals Made N/A    Medical Referrals Made N/A    Medical Appointment Completed Non-[facility] PCP/Clinic    CNP Interventions Advocate/Support;Counsel;Case Management;Educate    Screenings CN Performed Weight;N/A    ED Visit Averted N/A    Life-Saving Intervention Made N/A

## 2024-03-27 NOTE — Progress Notes (Signed)
"  °  Subjective:  Patient ID: [name], female    DOB: 12-30-1975,  MRN: [id number]  [name] presents to clinic today for for annual diabetic foot examination, at risk foot care with history of diabetic neuropathy, and painful mycotic toenails x 10 which interfere with daily activities. Pain is relieved with periodic professional debridement.  Chief Complaint  Patient presents with   Diabetes    Toenails  Saw Neita Dover, PA-C. - ?; A1c - 7.7 on 12/21/2023   New problem(s): None.   PCP is Neita Dover, PA-C.  Allergies[1]  Review of Systems: Negative except as noted in the HPI.  Objective: No changes noted in today's physical examination. There were no vitals filed for this visit. [name] is a pleasant 48 y.o. female thin build in NAD. AAO x 3.   Diabetic foot exam was performed with the following findings:   Vascular Examination: CFT <3 seconds b/l. DP/PT pulses faintly palpable b/l. Digital hair absent.  Skin temperature gradient warm to warm b/l. No pain with calf compression. No ischemia or gangrene. No cyanosis or clubbing noted b/l.    Neurological Examination: Sensation grossly intact b/l with 10 gram monofilament. Vibratory sensation intact b/l. Pt has subjective symptoms of neuropathy.  Dermatological Examination: Pedal skin warm and supple b/l.   No open wounds. No interdigital macerations.  Toenails 1-5 b/l thick, discolored, elongated with subungual debris and pain on dorsal palpation.    No corns, calluses, nor porokeratotic lesions.  Musculoskeletal Examination: Muscle strength 5/5 to all lower extremity muscle groups bilaterally. HAV with bunion deformity noted b/l LE. Hammertoe(s) 2-5 b/l.  Radiographs: None     Assessment/Plan: 1. Pain due to onychomycosis of toenails of both feet   2. Hallux valgus, acquired, bilateral   3. Personal history of diabetic foot ulcer   4. Acquired hammertoes of both feet   5.  Diabetic peripheral neuropathy associated with type 2 diabetes mellitus (HCC)   6. Encounter for diabetic foot exam (HCC)   Diabetic foot examination performed today. All patient's and/or POA's questions/concerns addressed on today's visit. Toenails 1-5 b/l debrided in length and girth without incident. Continue foot and shoe inspections daily. Monitor blood glucose per PCP/Endocrinologist's recommendations. Continue soft, supportive shoe gear daily. Report any pedal injuries to medical professional. Call office if there are any questions/concerns. -Patient/POA to call should there be question/concern in the interim.   Return in about 3 months (around 06/20/2024).  Delon LITTIE Merlin, DPM       LOCATION: [address], [place] [address]                   Office [phone]   [facility] LOCATION: [address] [place], [place] [address] Office [phone]     [1]  Allergies Allergen Reactions   Metformin  Nausea Only   "

## 2024-03-29 NOTE — Congregational Nurse Program (Signed)
" °  Dept: [phone]   Congregational Nurse Program Note  Date of Encounter: 03/29/2024  Clinic visit to review glucose readings for past week.  FreeStyle Libre sensor remains in place left upper arm. Discussed the episodes of low and high blood sugar readings on the [facility] reports.  Encouraged resident to eat meals at regular times and to drink water to help keep blood glucose within range. Past Medical History: Past Medical History:  Diagnosis Date   Diabetes mellitus without complication (HCC)    DiGeorge syndrome (HCC)    Ectrodactyly    Genetic defect    genetic mutation of unknown significance per [facility] genetic notes   GERD (gastroesophageal reflux disease)    Hypercholesterolemia    Hypertension    Intellectual disability    Microcephaly (HCC)    Pelvic mass    Ventral hernia     Encounter Details:  Community Questionnaire - 03/29/24 1545       Questionnaire   Ask client: Do you give verbal consent for me to treat you today? Yes    Student Assistance N/A    Location Patient Served  [place]    Encounter Setting CN site    Population Status Unknown   Has apartment at [facility]    Insurance/Financial Assistance Referral N/A    Medication Patient Medications Reviewed    Medical Provider Yes    Screening Referrals Made N/A    Medical Referrals Made N/A    Medical Appointment Completed N/A    CNP Interventions Advocate/Support;Counsel;Case Management;Educate    Screenings CN Performed Weight;N/A    ED Visit Averted N/A    Life-Saving Intervention Made N/A            "

## 2024-04-05 NOTE — Congregational Nurse Program (Signed)
" °  Dept: [phone]   Congregational Nurse Program Note  Date of Encounter: 04/05/2024  Clinic visit to change [facility] sensor for continuous glucose monitoring.  Removed used sensor from left forearm, applied new sensor to dorsal side of right upper forearm after cleaning area with alcohol swabs.  Verified that sensor was coordinated with the app on her cell phone, glucose readings will be available to her in 58 minutes per the notice on her cell phone.  Discussed need for her to choose foods, especially vegetables, that will keep her blood glucose within range the majority of the time.  The previous two weeks show within range 31% of the time. Educated resident on how the graphs on the [employer] app will help her stay within range. Past Medical History: Past Medical History:  Diagnosis Date   Diabetes mellitus without complication (HCC)    DiGeorge syndrome (HCC)    Ectrodactyly    Genetic defect    genetic mutation of unknown significance per [facility] genetic notes   GERD (gastroesophageal reflux disease)    Hypercholesterolemia    Hypertension    Intellectual disability    Microcephaly (HCC)    Pelvic mass    Ventral hernia     Encounter Details:  Community Questionnaire - 04/05/24 1602       Questionnaire   Ask client: Do you give verbal consent for me to treat you today? Yes    Student Assistance N/A    Location Patient Served  [place]    Encounter Setting CN site    Population Status Unknown   Has apartment at [facility]    Insurance/Financial Assistance Referral N/A    Medication Patient Medications Reviewed    Medical Provider Yes    Screening Referrals Made N/A    Medical Referrals Made N/A    Medical Appointment Completed N/A    CNP Interventions Advocate/Support;Counsel;Case Management;Educate    Screenings CN Performed Blood Glucose    ED Visit Averted N/A    Life-Saving Intervention Made N/A             "

## 2024-04-14 NOTE — Congregational Nurse Program (Signed)
" °  Dept: [phone]   Congregational Nurse Program Note  Date of Encounter: 04/12/2024  Clinic visit to check [facility] sensor placement.  Sensor intact on right upper arm dorsal side,   Average blood glucose 159 for past 7 Days.  Staying in range less than 50% of the time per the [facility] reports.  Reminded resident that diabetes control requires eating regular meals and taking insulin  at the prescribed times. Past Medical History: Past Medical History:  Diagnosis Date   Diabetes mellitus without complication (HCC)    DiGeorge syndrome (HCC)    Ectrodactyly    Genetic defect    genetic mutation of unknown significance per [facility] genetic notes   GERD (gastroesophageal reflux disease)    Hypercholesterolemia    Hypertension    Intellectual disability    Microcephaly (HCC)    Pelvic mass    Ventral hernia     Encounter Details:  Community Questionnaire - 04/12/24 1600       Questionnaire   Ask client: Do you give verbal consent for me to treat you today? Yes    Student Assistance N/A    Location Patient [place]    Encounter Setting CN site    Population Status Unknown   Has apartment at [facility]    Insurance/Financial Assistance Referral N/A    Medication Patient Medications Reviewed    Medical Provider Yes    Medical Referrals Made NA    Medical Appointment Completed N/A    Screenings CN Performed (remember to also record results) Blood Glucose    CNP Interventions Diabetes Education;Advocate/Support;Health Counseling    ED Visit Averted N/A      Questionnaire   Life-Saving Intervention Made N/A            "

## 2024-04-19 NOTE — Congregational Nurse Program (Signed)
" °  Dept: [phone]   Congregational Nurse Program Note  Date of Encounter: 04/19/2024  Clinic visit to change FreeStyle [place] [employer] sensor.  Sensor removed from right upper forearm, new sensor applied to left upper forearm then confirmed that the sensor was working with the [employer] application on her cellphone. Past Medical History: Past Medical History:  Diagnosis Date   Diabetes mellitus without complication (HCC)    DiGeorge syndrome (HCC)    Ectrodactyly    Genetic defect    genetic mutation of unknown significance per [facility] genetic notes   GERD (gastroesophageal reflux disease)    Hypercholesterolemia    Hypertension    Intellectual disability    Microcephaly (HCC)    Pelvic mass    Ventral hernia     Encounter Details:  Community Questionnaire - 04/19/24 1455       Questionnaire   Ask client: Do you give verbal consent for me to treat you today? Yes    Student Assistance N/A    Location [place]    Encounter Setting CN site    Population Status Unknown   Has apartment at [facility]    Insurance/Financial Assistance Referral N/A    Medication Patient Medications Reviewed    Medical Provider Yes    Medical Referrals Made NA    Medical Appointment Completed N/A    Screenings CN Performed (remember to also record results) Blood Glucose    CNP Interventions Diabetes Education;Advocate/Support;Health Counseling    ED Visit Averted N/A      Questionnaire   Life-Saving Intervention Made N/A            "

## 2024-07-05 ENCOUNTER — Ambulatory Visit: Admitting: Podiatry
